# Patient Record
Sex: Female | Born: 1935 | Race: White | Hispanic: No | Marital: Married | Smoking: Never smoker
Health system: Southern US, Community
[De-identification: ages and names within clinical notes are randomized; demographics above are authoritative.]

## PROBLEM LIST (undated history)

## (undated) ENCOUNTER — Ambulatory Visit: Admission: EM

## (undated) DIAGNOSIS — I499 Cardiac arrhythmia, unspecified: Secondary | ICD-10-CM

## (undated) DIAGNOSIS — R51 Headache: Secondary | ICD-10-CM

## (undated) DIAGNOSIS — S27329A Contusion of lung, unspecified, initial encounter: Secondary | ICD-10-CM

## (undated) DIAGNOSIS — H8110 Benign paroxysmal vertigo, unspecified ear: Secondary | ICD-10-CM

## (undated) DIAGNOSIS — R001 Bradycardia, unspecified: Secondary | ICD-10-CM

## (undated) DIAGNOSIS — I10 Essential (primary) hypertension: Secondary | ICD-10-CM

## (undated) DIAGNOSIS — F419 Anxiety disorder, unspecified: Secondary | ICD-10-CM

## (undated) DIAGNOSIS — Z7901 Long term (current) use of anticoagulants: Secondary | ICD-10-CM

## (undated) DIAGNOSIS — E7800 Pure hypercholesterolemia, unspecified: Secondary | ICD-10-CM

## (undated) DIAGNOSIS — E079 Disorder of thyroid, unspecified: Secondary | ICD-10-CM

## (undated) DIAGNOSIS — Z170 Estrogen receptor positive status [ER+]: Principal | ICD-10-CM

## (undated) DIAGNOSIS — S2249XA Multiple fractures of ribs, unspecified side, initial encounter for closed fracture: Secondary | ICD-10-CM

## (undated) DIAGNOSIS — C50411 Malignant neoplasm of upper-outer quadrant of right female breast: Secondary | ICD-10-CM

## (undated) DIAGNOSIS — I639 Cerebral infarction, unspecified: Secondary | ICD-10-CM

## (undated) DIAGNOSIS — I4891 Unspecified atrial fibrillation: Secondary | ICD-10-CM

## (undated) HISTORY — DX: Estrogen receptor positive status (ER+): Z17.0

## (undated) HISTORY — DX: Bradycardia, unspecified: R00.1

## (undated) HISTORY — PX: PACEMAKER INSERTION: SHX728

## (undated) HISTORY — DX: Long term (current) use of anticoagulants: Z79.01

## (undated) HISTORY — DX: Malignant neoplasm of upper-outer quadrant of right female breast: C50.411

## (undated) HISTORY — PX: INSERT / REPLACE / REMOVE PACEMAKER: SUR710

## (undated) HISTORY — DX: Cerebral infarction, unspecified: I63.9

## (undated) HISTORY — PX: CHOLECYSTECTOMY: SHX55

---

## 2005-06-10 ENCOUNTER — Ambulatory Visit (HOSPITAL_COMMUNITY): Admission: RE | Admit: 2005-06-10 | Discharge: 2005-06-10 | Payer: Self-pay | Admitting: Internal Medicine

## 2005-06-10 ENCOUNTER — Ambulatory Visit: Payer: Self-pay | Admitting: Internal Medicine

## 2011-02-11 DIAGNOSIS — R011 Cardiac murmur, unspecified: Secondary | ICD-10-CM

## 2013-06-17 DIAGNOSIS — I639 Cerebral infarction, unspecified: Secondary | ICD-10-CM

## 2013-06-17 HISTORY — DX: Cerebral infarction, unspecified: I63.9

## 2013-07-11 ENCOUNTER — Inpatient Hospital Stay (HOSPITAL_COMMUNITY)
Admission: EM | Admit: 2013-07-11 | Discharge: 2013-07-16 | DRG: 041 | Disposition: A | Discharge status: Home / Self Care | Payer: Medicare Other | Attending: Neurology | Admitting: Neurology

## 2013-07-11 ENCOUNTER — Encounter (HOSPITAL_COMMUNITY): Payer: Self-pay | Admitting: Radiology

## 2013-07-11 ENCOUNTER — Emergency Department (HOSPITAL_COMMUNITY): Payer: Medicare Other

## 2013-07-11 DIAGNOSIS — I639 Cerebral infarction, unspecified: Secondary | ICD-10-CM | POA: Diagnosis present

## 2013-07-11 DIAGNOSIS — G8194 Hemiplegia, unspecified affecting left nondominant side: Secondary | ICD-10-CM | POA: Diagnosis present

## 2013-07-11 DIAGNOSIS — E039 Hypothyroidism, unspecified: Secondary | ICD-10-CM | POA: Diagnosis present

## 2013-07-11 DIAGNOSIS — E7800 Pure hypercholesterolemia, unspecified: Secondary | ICD-10-CM | POA: Diagnosis present

## 2013-07-11 DIAGNOSIS — K219 Gastro-esophageal reflux disease without esophagitis: Secondary | ICD-10-CM | POA: Diagnosis present

## 2013-07-11 DIAGNOSIS — R531 Weakness: Secondary | ICD-10-CM

## 2013-07-11 DIAGNOSIS — E782 Mixed hyperlipidemia: Secondary | ICD-10-CM | POA: Diagnosis present

## 2013-07-11 DIAGNOSIS — I6340 Cerebral infarction due to embolism of unspecified cerebral artery: Principal | ICD-10-CM | POA: Diagnosis present

## 2013-07-11 DIAGNOSIS — Z79899 Other long term (current) drug therapy: Secondary | ICD-10-CM

## 2013-07-11 DIAGNOSIS — R471 Dysarthria and anarthria: Secondary | ICD-10-CM | POA: Diagnosis present

## 2013-07-11 DIAGNOSIS — I69322 Dysarthria following cerebral infarction: Secondary | ICD-10-CM

## 2013-07-11 DIAGNOSIS — G8190 Hemiplegia, unspecified affecting unspecified side: Secondary | ICD-10-CM | POA: Diagnosis present

## 2013-07-11 DIAGNOSIS — I10 Essential (primary) hypertension: Secondary | ICD-10-CM | POA: Diagnosis present

## 2013-07-11 DIAGNOSIS — E785 Hyperlipidemia, unspecified: Secondary | ICD-10-CM | POA: Diagnosis present

## 2013-07-11 DIAGNOSIS — I63511 Cerebral infarction due to unspecified occlusion or stenosis of right middle cerebral artery: Secondary | ICD-10-CM | POA: Diagnosis present

## 2013-07-11 DIAGNOSIS — F411 Generalized anxiety disorder: Secondary | ICD-10-CM | POA: Diagnosis present

## 2013-07-11 HISTORY — DX: Anxiety disorder, unspecified: F41.9

## 2013-07-11 HISTORY — DX: Disorder of thyroid, unspecified: E07.9

## 2013-07-11 HISTORY — DX: Essential (primary) hypertension: I10

## 2013-07-11 HISTORY — DX: Headache: R51

## 2013-07-11 HISTORY — DX: Benign paroxysmal vertigo, unspecified ear: H81.10

## 2013-07-11 HISTORY — DX: Pure hypercholesterolemia, unspecified: E78.00

## 2013-07-11 LAB — POCT I-STAT TROPONIN I: Troponin i, poc: 0 ng/mL (ref 0.00–0.08)

## 2013-07-11 LAB — RAPID URINE DRUG SCREEN, HOSP PERFORMED
Amphetamines: NOT DETECTED
Benzodiazepines: NOT DETECTED
Cocaine: NOT DETECTED
Opiates: NOT DETECTED
Tetrahydrocannabinol: NOT DETECTED

## 2013-07-11 LAB — COMPREHENSIVE METABOLIC PANEL
ALT: 13 U/L (ref 0–35)
AST: 15 U/L (ref 0–37)
Albumin: 3.4 g/dL — ABNORMAL LOW (ref 3.5–5.2)
Alkaline Phosphatase: 67 U/L (ref 39–117)
Calcium: 8.4 mg/dL (ref 8.4–10.5)
Chloride: 107 mEq/L (ref 96–112)
Creatinine, Ser: 1.09 mg/dL (ref 0.50–1.10)
GFR calc non Af Amer: 46 mL/min — ABNORMAL LOW (ref >90)
Sodium: 141 mEq/L (ref 135–145)
Total Bilirubin: 0.7 mg/dL (ref 0.3–1.2)
Total Protein: 6.3 g/dL (ref 6.0–8.3)

## 2013-07-11 LAB — CBC
HCT: 32.8 % — ABNORMAL LOW (ref 36.0–46.0)
MCH: 29.8 pg (ref 26.0–34.0)
MCHC: 32.6 g/dL (ref 30.0–36.0)
MCV: 91.4 fL (ref 78.0–100.0)
Platelets: 142 10*3/uL — ABNORMAL LOW (ref 150–400)
RBC: 3.59 MIL/uL — ABNORMAL LOW (ref 3.87–5.11)
RDW: 12.7 % (ref 11.5–15.5)
WBC: 5.1 10*3/uL (ref 4.0–10.5)

## 2013-07-11 LAB — DIFFERENTIAL
Basophils Absolute: 0 10*3/uL (ref 0.0–0.1)
Basophils Relative: 0 % (ref 0–1)
Eosinophils Absolute: 0.1 10*3/uL (ref 0.0–0.7)
Eosinophils Relative: 3 % (ref 0–5)
Monocytes Absolute: 0.7 10*3/uL (ref 0.1–1.0)

## 2013-07-11 LAB — PROTIME-INR: Prothrombin Time: 12.7 seconds (ref 11.6–15.2)

## 2013-07-11 LAB — GLUCOSE, CAPILLARY: Glucose-Capillary: 110 mg/dL — ABNORMAL HIGH (ref 70–99)

## 2013-07-11 LAB — URINALYSIS, ROUTINE W REFLEX MICROSCOPIC
Bilirubin Urine: NEGATIVE
Hgb urine dipstick: NEGATIVE
Ketones, ur: NEGATIVE mg/dL
Specific Gravity, Urine: 1.014 (ref 1.005–1.030)
Urobilinogen, UA: 1 mg/dL (ref 0.0–1.0)
pH: 5.5 (ref 5.0–8.0)

## 2013-07-11 LAB — POCT I-STAT, CHEM 8
Calcium, Ion: 1.15 mmol/L (ref 1.13–1.30)
HCT: 32 % — ABNORMAL LOW (ref 36.0–46.0)
Hemoglobin: 10.9 g/dL — ABNORMAL LOW (ref 12.0–15.0)
TCO2: 25 mmol/L (ref 0–100)

## 2013-07-11 LAB — ETHANOL: Alcohol, Ethyl (B): <11 mg/dL (ref 0–11)

## 2013-07-11 MED ORDER — INFLUENZA VAC SPLIT QUAD 0.5 ML IM SUSP
0.5000 mL | INTRAMUSCULAR | Status: AC
  Administered 2013-07-12: 0.5 mL via INTRAMUSCULAR
  Filled 2013-07-11: qty 0.5

## 2013-07-11 MED ORDER — STROKE: EARLY STAGES OF RECOVERY BOOK
Freq: Once | Status: AC
  Administered 2013-07-12
  Filled 2013-07-11 (×2): qty 1

## 2013-07-11 MED ORDER — BIOTENE DRY MOUTH MT LIQD: 15.0000 mL | Freq: Two times a day (BID) | OROMUCOSAL | Status: DC

## 2013-07-11 MED ORDER — LABETALOL HCL 5 MG/ML IV SOLN: 10.0000 mg | INTRAVENOUS | Status: DC | PRN

## 2013-07-11 MED ORDER — ACETAMINOPHEN 650 MG RE SUPP: 650.0000 mg | RECTAL | Status: DC | PRN

## 2013-07-11 MED ORDER — ACETAMINOPHEN 325 MG PO TABS
650.0000 mg | ORAL_TABLET | ORAL | Status: DC | PRN
  Administered 2013-07-12 – 2013-07-16 (×2): 650 mg via ORAL
  Filled 2013-07-11 (×2): qty 2

## 2013-07-11 MED ORDER — ALTEPLASE (STROKE) FULL DOSE INFUSION
0.9000 mg/kg | Freq: Once | INTRAVENOUS | Status: AC
  Administered 2013-07-11: 56 mg via INTRAVENOUS
  Filled 2013-07-11: qty 56

## 2013-07-11 MED ORDER — SODIUM CHLORIDE 0.9 % IV SOLN
INTRAVENOUS | Status: DC
  Administered 2013-07-11 – 2013-07-12 (×2): via INTRAVENOUS

## 2013-07-11 MED ORDER — SENNOSIDES-DOCUSATE SODIUM 8.6-50 MG PO TABS
1.0000 | ORAL_TABLET | Freq: Every evening | ORAL | Status: DC | PRN
  Filled 2013-07-11: qty 1

## 2013-07-11 MED ORDER — PANTOPRAZOLE SODIUM 40 MG IV SOLR
40.0000 mg | Freq: Every day | INTRAVENOUS | Status: DC
  Administered 2013-07-12 (×2): 40 mg via INTRAVENOUS
  Filled 2013-07-11 (×4): qty 40

## 2013-07-11 NOTE — ED Notes (Signed)
Pt remains alert and oriented x's 3, family at bedside.   Report given and pt transported to 3M01

## 2013-07-11 NOTE — Progress Notes (Signed)
Patient kept npo including medications.

## 2013-07-11 NOTE — ED Provider Notes (Signed)
CSN: [id number]     Arrival date & time 07/11/13  1946 History   First MD Initiated Contact with Patient 07/11/13 1950     Chief Complaint  Patient presents with  . Code Stroke   (Consider location/radiation/quality/duration/timing/severity/associated sxs/prior Treatment) Patient is a 79 y.o. female presenting with weakness. The history is provided by the EMS personnel and the patient. The history is limited by the condition of the patient (code stroke, difficulty speaking).  Weakness This is a new problem. The current episode started today (1700). The problem occurs constantly. The problem has been unchanged. Associated symptoms include weakness (left arm, leg, and face). Pertinent negatives include no arthralgias, chest pain, congestion, fever, headaches, myalgias, nausea, numbness, rash or vomiting. Nothing aggravates the symptoms. She has tried nothing for the symptoms. The treatment provided no relief.    Past Medical History  Diagnosis Date  . Hypertension   . Vertical vertigo   . Thyroid disease   . High cholesterol    Past Surgical History  Procedure Laterality Date  . Cholecystectomy     No family history on file. History  Substance Use Topics  . Smoking status: Never Smoker   . Smokeless tobacco: Not on file  . Alcohol Use: No   OB History   Grav Para Term Preterm Abortions TAB SAB Ect Mult Living                 Review of Systems  Constitutional: Negative for fever, activity change and appetite change.  HENT: Positive for trouble swallowing. Negative for congestion.   Cardiovascular: Negative for chest pain.  Gastrointestinal: Negative for nausea, vomiting, diarrhea and constipation.  Genitourinary: Negative for difficulty urinating.  Musculoskeletal: Negative for myalgias and arthralgias.  Skin: Negative for color change, pallor, rash and wound.  Neurological: Positive for facial asymmetry, speech difficulty and weakness (left arm, leg, and face). Negative for  light-headedness, numbness and headaches.  All other systems reviewed and are negative.    Allergies  Penicillins and Shellfish allergy  Home Medications   No current outpatient prescriptions on file. BP 127/46  Pulse 78  Temp(Src) 98.5 F (36.9 C) (Oral)  Resp 14  Wt 143 lb 4.8 oz (65 kg)  SpO2 95% Physical Exam  Constitutional: She is oriented to person, place, and time. She appears well-developed and well-nourished.  HENT:  Head: Normocephalic and atraumatic.  Nose: Nose normal.  Mouth/Throat: Uvula is midline, oropharynx is clear and moist and mucous membranes are normal.  Left-sided facial droop with slurred speech  Eyes: Conjunctivae and EOM are normal. Pupils are equal, round, and reactive to light.  Neck: Normal range of motion. Neck supple.  Cardiovascular: Normal rate, regular rhythm and normal heart sounds.  Exam reveals no gallop and no friction rub.   No murmur heard. Pulmonary/Chest: Effort normal and breath sounds normal. No respiratory distress. She has no wheezes. She has no rales.  Abdominal: Soft. She exhibits no distension. There is no tenderness.  Musculoskeletal: Normal range of motion. She exhibits no edema and no tenderness.  Lymphadenopathy:    She has no cervical adenopathy.  Neurological: She is alert and oriented to person, place, and time. She displays no tremor. A cranial nerve deficit (left-sided facial droop) is present. No sensory deficit. She exhibits normal muscle tone. Coordination (due to weakness on left side) abnormal. GCS eye subscore is 4. GCS verbal subscore is 5. GCS motor subscore is 6.  0/5 strength in LUE and LLE  Skin: Skin is warm  and dry. No rash noted. She is not diaphoretic.    ED Course  Procedures (including critical care time) Labs Review Labs Reviewed  APTT - Abnormal; Notable for the following:    aPTT 42 (*)    All other components within normal limits  CBC - Abnormal; Notable for the following:    RBC 3.59 (*)     Hemoglobin 10.7 (*)    HCT 32.8 (*)    Platelets 142 (*)    All other components within normal limits  DIFFERENTIAL - Abnormal; Notable for the following:    Monocytes Relative 14 (*)    All other components within normal limits  COMPREHENSIVE METABOLIC PANEL - Abnormal; Notable for the following:    Glucose, Bld 102 (*)    Albumin 3.4 (*)    GFR calc non Af Amer 46 (*)    GFR calc Af Amer 53 (*)    All other components within normal limits  GLUCOSE, CAPILLARY - Abnormal; Notable for the following:    Glucose-Capillary 110 (*)    All other components within normal limits  POCT I-STAT, CHEM 8 - Abnormal; Notable for the following:    Glucose, Bld 100 (*)    Hemoglobin 10.9 (*)    HCT 32.0 (*)    All other components within normal limits  MRSA PCR SCREENING  ETHANOL  PROTIME-INR  TROPONIN I  URINE RAPID DRUG SCREEN (HOSP PERFORMED)  URINALYSIS, ROUTINE W REFLEX MICROSCOPIC  HEMOGLOBIN A1C  LIPID PANEL  POCT I-STAT TROPONIN I   Imaging Review Ct Head Wo Contrast  07/11/2013   CLINICAL DATA:  Slurred speech.  EXAM: CT HEAD WITHOUT CONTRAST  TECHNIQUE: Contiguous axial images were obtained from the base of the skull through the vertex without intravenous contrast.  COMPARISON:  MR dated 01/28/2013.  FINDINGS: Minimally enlarged ventricles and subarachnoid spaces. Mild patchy white matter low density in both cerebral hemispheres. This remains more focal in the left frontal lobe. No intracranial hemorrhage, mass lesion or CT evidence of acute infarction. Unremarkable bones and included paranasal sinuses.  IMPRESSION: No acute abnormality. Minimal atrophy and mild chronic small vessel white matter ischemic changes.  These results were called by telephone at the time of interpretation on 07/11/2013 at 8:10 PM to Dr. Dr. Vanetta Mulders , who verbally acknowledged these results.   Electronically Signed   By: Gordan Payment   On: 07/11/2013 20:11    MDM   1. Stroke   2. Left-sided weakness      The patient is an 79 year old female with a past medical history of hypertension he presents as acute stroke. The patient was met at the bridge and her airway was deemed to be intact. She was found to have profound left-sided weakness with 0/5 strength in her left upper and left lower extremities with left facial droop and trouble with speaking. The onset of these symptoms was approximately 5 minutes til 7 when she was sitting at a computer. She was taken straight to the scanner where CT of her head was done showing no acute intracranial abnormality. Based on symptoms as well as lack of bleeding in the brain, TPA was given based on neurology recommendations. Code stroke labs were sent and anticipate admission to the neuro ICU for further monitoring and treatment.  Labs show no UTI, mild hyperglycemia, mild anemia, normal lytes, and negative troponin. Admitted to Neuro ICU in stable condition.  Patient was discussed with my attending, Dr. Deretha Emory.   Dorna Leitz, MD 07/11/13  2152 

## 2013-07-11 NOTE — Code Documentation (Signed)
79 yo wf brought in via [facility] with sudden onset Lt side weakness, slurred speech, & facial droop.  Per report pt was working at the computer & reached over to fix the printer when she developed Lt side weakness & her husband assisted her to the floor.  Code stroke called 1928, pt arrival 1943, LKW 1850, EDP exam 1946, stroke team arrival 1935, neurologist arrival 1945, pt arrival in CT, phlebotomist arrival 1945, pharmacy notified to mix tPA 2007, ICU bed requested 2020, tPA delivered 2020, tPA admistered 2022, door to needle 0039.  NIH 16 Lt side weakness, slurred speech, facial droop, Lt neglect, sensory deficit.

## 2013-07-11 NOTE — ED Notes (Addendum)
Family st's pt was working on computer when she started having slurred and problems walking.  On arrival to ED pt alert and oriented x's 3.

## 2013-07-11 NOTE — Consult Note (Addendum)
Referring Physician: ED    Chief Complaint: CODE STROKE: LEFT HEMIPARESIS, LEFT FACE WEAKNESS, DYSARTHRIA.  HPI:                                                                                                                                         Jessica Myers is an 79 y.o. female, right handed, with a past medical history significant for HTN, hyperlipidemia, GERD, brought to [facility] ED by medics as a code stroke due to acute onset left hemiparesis, left face weakness, and dysarthria. She was last known well at 1850 pm today, doing some work at her computer, when her husband noticed that she was having slurred speech, weakness of the left face, and inability to use the left side of her body. Ambulance called and upon initial evaluation in the ED she had NIHSS 16. CT brain revealed no acute intracranial abnormality. Of importance, family report that couple of weeks ago she had transient slurred speech and had a brain MRI that was normal. At this moment, she is alert and awake, able to follow commands properly.  Date last known well: 07/11/13  Time last known well: 1850 pm tPA Given: yes NIHSS: 16 MRS: 3   History reviewed. No pertinent past medical history.  No past surgical history on file.  No family history on file. Social History:  has no tobacco, alcohol, and drug history on file.  Allergies: Allergies not on file  Medications:                                                                                                                           I have reviewed the patient's current medications.  ROS:  History obtained from chart review, family, EMS  General ROS: negative for - chills, fatigue, fever, night sweats, weight gain or weight loss Psychological ROS: negative for - behavioral disorder, hallucinations, memory difficulties, or suicidal  ideation Ophthalmic ROS: negative for - blurry vision, double vision, eye pain or loss of vision ENT ROS: negative for - epistaxis, nasal discharge, oral lesions, sore throat, tinnitus or vertigo Allergy and Immunology ROS: negative for - hives or itchy/watery eyes Hematological and Lymphatic ROS: negative for - bleeding problems, bruising or swollen lymph nodes Endocrine ROS: negative for - galactorrhea, hair pattern changes, polydipsia/polyuria or temperature intolerance Respiratory ROS: negative for - cough, hemoptysis, shortness of breath or wheezing Cardiovascular ROS: negative for - chest pain, dyspnea on exertion, edema or irregular heartbeat Gastrointestinal ROS: negative for - abdominal pain, diarrhea, hematemesis, nausea/vomiting or stool incontinence Genito-Urinary ROS: negative for - dysuria, hematuria, incontinence or urinary frequency/urgency Musculoskeletal ROS: negative for - joint swelling Neurological ROS: as noted in HPI Dermatological ROS: negative for rash and skin lesion changes  Physical exam: pleasant female in no apparent distress.  BP 150/70 Weight 62.596 kg (138 lb). Pulse 72, afebrile Head: normocephalic. Neck: supple, no bruits, no JVD. Cardiac: no murmurs. Lungs: clear. Abdomen: soft, no tender, no mass. Extremities: no edema.  Neurologic Examination:                                                                                                      Mental Status: Alert, awake, oriented, thought content appropriate.  Comprehension, naming, and repetition intact. Dysarthric without evidence of aphasia.  Able to follow 3 step commands without difficulty. Cranial Nerves: II: Discs flat bilaterally; Visual fields grossly normal, pupils equal, round, reactive to light and accommodation III,IV, VI: ptosis not present, extra-ocular motions intact bilaterally V: facial light touch sensation normal bilaterally VII: mild left lower face weakness. VIII: hearing  normal bilaterally IX,X: gag reflex present XI: bilateral shoulder shrug XII: midline tongue extension Motor: Significant for left HP, arm greater than leg Tone and bulk: seems to have increase tone in the left UE tone throughout; no atrophy noted Sensory: Pinprick and light touch slightly impaired in the left. Neglecting the left side. Deep Tendon Reflexes:  Right: Upper Extremity   Left: Upper extremity   biceps (C-5 to C-6) 2/4   biceps (C-5 to C-6) 2/4 tricep (C7) 2/4    triceps (C7) 2/4 Brachioradialis (C6) 2/4  Brachioradialis (C6) 2/4  Lower Extremity Lower Extremity  quadriceps (L-2 to L-4) 2/4   quadriceps (L-2 to L-4) 2/4 Achilles (S1) 2/4   Achilles (S1) 2/4  Plantars: Right: downgoing   Left: upgoing Cerebellar: normal finger-to-nose right,  Can nor perform in the left. Gait:  No tested. CV: pulses palpable throughout    Results for orders placed during the hospital encounter of 07/11/13 (from the past 48 hour(s))  PROTIME-INR     Status: None   Collection Time    07/11/13  7:48 PM      Result Value Range   Prothrombin Time 12.7  11.6 - 15.2 seconds  INR 0.97  0.00 - 1.49  APTT     Status: Abnormal   Collection Time    07/11/13  7:48 PM      Result Value Range   aPTT 42 (*) 24 - 37 seconds   Comment:            IF BASELINE aPTT IS ELEVATED,     SUGGEST PATIENT RISK ASSESSMENT     BE USED TO DETERMINE APPROPRIATE     ANTICOAGULANT THERAPY.  CBC     Status: Abnormal   Collection Time    07/11/13  7:48 PM      Result Value Range   WBC 5.1  4.0 - 10.5 K/uL   RBC 3.59 (*) 3.87 - 5.11 MIL/uL   Hemoglobin 10.7 (*) 12.0 - 15.0 g/dL   HCT 81.1 (*) 91.4 - 78.2 %   MCV 91.4  78.0 - 100.0 fL   MCH 29.8  26.0 - 34.0 pg   MCHC 32.6  30.0 - 36.0 g/dL   RDW 95.6  21.3 - 08.6 %   Platelets 142 (*) 150 - 400 K/uL  DIFFERENTIAL     Status: Abnormal   Collection Time    07/11/13  7:48 PM      Result Value Range   Neutrophils Relative % 45  43 - 77 %   Neutro  Abs 2.3  1.7 - 7.7 K/uL   Lymphocytes Relative 38  12 - 46 %   Lymphs Abs 1.9  0.7 - 4.0 K/uL   Monocytes Relative 14 (*) 3 - 12 %   Monocytes Absolute 0.7  0.1 - 1.0 K/uL   Eosinophils Relative 3  0 - 5 %   Eosinophils Absolute 0.1  0.0 - 0.7 K/uL   Basophils Relative 0  0 - 1 %   Basophils Absolute 0.0  0.0 - 0.1 K/uL  POCT I-STAT TROPONIN I     Status: None   Collection Time    07/11/13  7:56 PM      Result Value Range   Troponin i, poc 0.00  0.00 - 0.08 ng/mL   Comment 3            Comment: Due to the release kinetics of cTnI,     a negative result within the first hours     of the onset of symptoms does not rule out     myocardial infarction with certainty.     If myocardial infarction is still suspected,     repeat the test at appropriate intervals.  POCT I-STAT, CHEM 8     Status: Abnormal   Collection Time    07/11/13  7:58 PM      Result Value Range   Sodium 143  135 - 145 mEq/L   Potassium 4.0  3.5 - 5.1 mEq/L   Chloride 108  96 - 112 mEq/L   BUN 14  6 - 23 mg/dL   Creatinine, Ser 5.78  0.50 - 1.10 mg/dL   Glucose, Bld 469 (*) 70 - 99 mg/dL   Calcium, Ion 6.29  5.28 - 1.30 mmol/L   TCO2 25  0 - 100 mmol/L   Hemoglobin 10.9 (*) 12.0 - 15.0 g/dL   HCT 41.3 (*) 24.4 - 01.0 %   Ct Head Wo Contrast  07/11/2013   CLINICAL DATA:  Slurred speech.  EXAM: CT HEAD WITHOUT CONTRAST  TECHNIQUE: Contiguous axial images were obtained from the base of the skull through the vertex without intravenous contrast.  COMPARISON:  MR dated 01/28/2013.  FINDINGS: Minimally enlarged ventricles and subarachnoid spaces. Mild patchy white matter low density in both cerebral hemispheres. This remains more focal in the left frontal lobe. No intracranial hemorrhage, mass lesion or CT evidence of acute infarction. Unremarkable bones and included paranasal sinuses.  IMPRESSION: No acute abnormality. Minimal atrophy and mild chronic small vessel white matter ischemic changes.  These results were called  by telephone at the time of interpretation on 07/11/2013 at 8:10 PM to Dr. Dr. Vanetta Mulders , who verbally acknowledged these results.   Electronically Signed   By: Gordan Payment   On: 07/11/2013 20:11     Assessment: 79 y.o. female with right brain stroke. NIHSS 16. CT brain unremarkable for acute abnormality. All inclusion and exclusion criteria for iv tpa were reviewed and patient is deemed an appropriate candidate for IV thrombolysis and thus we pursued that pathway. Family at the bedside and I explained in details the risk and benefits of tpa and they agreed with that therapy. Admit to NICU and follow post IV tpa protocol. Will follow up closely tonight and the stroke team will resume care in the morning.  Stroke Risk Factors - age, HTN, hyperlipidemia.  Plan: 1. HgbA1c, fasting lipid panel 2. MRI, MRA  of the brain without contrast 3. Echocardiogram 4. Carotid dopplers 5. Prophylactic therapy-None for next 24 hours after tpa. 6. Risk factor modification 7. Telemetry monitoring 8. Frequent neuro checks 9. PT/OT SLP

## 2013-07-12 ENCOUNTER — Inpatient Hospital Stay (HOSPITAL_COMMUNITY): Payer: Medicare Other

## 2013-07-12 ENCOUNTER — Encounter (HOSPITAL_COMMUNITY): Payer: Self-pay | Admitting: Neurology

## 2013-07-12 DIAGNOSIS — I6350 Cerebral infarction due to unspecified occlusion or stenosis of unspecified cerebral artery: Secondary | ICD-10-CM

## 2013-07-12 DIAGNOSIS — I6330 Cerebral infarction due to thrombosis of unspecified cerebral artery: Secondary | ICD-10-CM

## 2013-07-12 LAB — LIPID PANEL
Cholesterol: 136 mg/dL (ref 0–200)
LDL Cholesterol: 67 mg/dL (ref 0–99)
Total CHOL/HDL Ratio: 2.5 RATIO
Triglycerides: 71 mg/dL (ref <150)
VLDL: 14 mg/dL (ref 0–40)

## 2013-07-12 MED ORDER — ATORVASTATIN CALCIUM 10 MG PO TABS
10.0000 mg | ORAL_TABLET | Freq: Every day | ORAL | Status: DC
  Administered 2013-07-12 – 2013-07-16 (×5): 10 mg via ORAL
  Filled 2013-07-12 (×5): qty 1

## 2013-07-12 MED ORDER — ALPRAZOLAM 0.5 MG PO TABS
0.5000 mg | ORAL_TABLET | ORAL | Status: DC
  Filled 2013-07-12: qty 1

## 2013-07-12 MED ORDER — ASPIRIN EC 325 MG PO TBEC
325.0000 mg | DELAYED_RELEASE_TABLET | Freq: Once | ORAL | Status: AC
  Administered 2013-07-12: 325 mg via ORAL
  Filled 2013-07-12: qty 1

## 2013-07-12 MED ORDER — LEVOTHYROXINE SODIUM 100 MCG PO TABS
100.0000 ug | ORAL_TABLET | Freq: Every day | ORAL | Status: DC
  Administered 2013-07-12 – 2013-07-16 (×5): 100 ug via ORAL
  Filled 2013-07-12 (×7): qty 1

## 2013-07-12 MED ORDER — ALPRAZOLAM 0.25 MG PO TABS
0.2500 mg | ORAL_TABLET | Freq: Four times a day (QID) | ORAL | Status: DC | PRN
  Administered 2013-07-12 – 2013-07-15 (×6): 0.25 mg via ORAL
  Filled 2013-07-12 (×5): qty 1

## 2013-07-12 NOTE — Progress Notes (Signed)
Stroke Team Progress Note  HISTORY Jessica Myers is an 79 y.o. female, right handed, with a past medical history significant for HTN, hyperlipidemia, GERD, brought to [facility] ED by medics as a code stroke due to acute onset left hemiparesis, left face weakness, and dysarthria.  She was last known well at 1850 pm today, doing some work at her computer, when her husband noticed that she was having slurred speech, weakness of the left face, and inability to use the left side of her body. Ambulance called and upon initial evaluation in the ED she had NIHSS 16.   CT brain revealed no acute intracranial abnormality.   Of importance, family report that couple of weeks ago she had transient slurred speech and had a brain MRI that was normal  Date last known well: 07/11/13  Time last known well: 1850 pm  tPA Given: yes  NIHSS: 16  MRS: 3   SUBJECTIVE Her family is at the bedside.  Overall she feels her condition is rapidly improving.   OBJECTIVE Most recent Vital Signs: Filed Vitals:   07/12/13 0500 07/12/13 0515 07/12/13 0600 07/12/13 0700  BP: 134/105  133/70 127/50  Pulse: 67 62 66 63  Temp:    98.2 F (36.8 C)  TempSrc:    Oral  Resp: 20 18 17 15   Height:      Weight:      SpO2: 100% 97% 100% 98%   CBG (last 3)   Recent Labs  07/11/13 2010  GLUCAP 110*    IV Fluid Intake:   . sodium chloride 75 mL/hr at 07/12/13 0600    MEDICATIONS  . antiseptic oral rinse  15 mL Mouth Rinse BID  . influenza vac split quadrivalent PF  0.5 mL Intramuscular Tomorrow-1000  . pantoprazole (PROTONIX) IV  40 mg Intravenous QHS   PRN:  acetaminophen, acetaminophen, labetalol, senna-docusate  Diet:  NPO  Activity:  Bedrest  DVT Prophylaxis:  SCD  CLINICALLY SIGNIFICANT STUDIES Basic Metabolic Panel:  Recent Labs Lab 07/11/13 1948 07/11/13 1958  NA 141 143  K 4.0 4.0  CL 107 108  CO2 23  --   GLUCOSE 102* 100*  BUN 14 14  CREATININE 1.09 1.10  CALCIUM 8.4  --    Liver Function  Tests:  Recent Labs Lab 07/11/13 1948  AST 15  ALT 13  ALKPHOS 67  BILITOT 0.7  PROT 6.3  ALBUMIN 3.4*   CBC:  Recent Labs Lab 07/11/13 1948 07/11/13 1958  WBC 5.1  --   NEUTROABS 2.3  --   HGB 10.7* 10.9*  HCT 32.8* 32.0*  MCV 91.4  --   PLT 142*  --    Coagulation:  Recent Labs Lab 07/11/13 1948  LABPROT 12.7  INR 0.97   Cardiac Enzymes:  Recent Labs Lab 07/11/13 1948  TROPONINI <0.30   Urinalysis:  Recent Labs Lab 07/11/13 2031  COLORURINE YELLOW  LABSPEC 1.014  PHURINE 5.5  GLUCOSEU NEGATIVE  HGBUR NEGATIVE  BILIRUBINUR NEGATIVE  KETONESUR NEGATIVE  PROTEINUR NEGATIVE  UROBILINOGEN 1.0  NITRITE NEGATIVE  LEUKOCYTESUR NEGATIVE   Lipid Panel    Component Value Date/Time   CHOL 136 07/12/2013 0640   TRIG 71 07/12/2013 0640   HDL 55 07/12/2013 0640   CHOLHDL 2.5 07/12/2013 0640   VLDL 14 07/12/2013 0640   LDLCALC 67 07/12/2013 0640   HgbA1C  No results found for this basename: HGBA1C    Urine Drug Screen:     Component Value Date/Time  LABOPIA NONE DETECTED 07/11/2013 2031   COCAINSCRNUR NONE DETECTED 07/11/2013 2031   LABBENZ NONE DETECTED 07/11/2013 2031   AMPHETMU NONE DETECTED 07/11/2013 2031   THCU NONE DETECTED 07/11/2013 2031   LABBARB NONE DETECTED 07/11/2013 2031    Alcohol Level:  Recent Labs Lab 07/11/13 1948  ETH <11    Ct Head Wo Contrast 07/11/2013   : No acute abnormality. Minimal atrophy and mild chronic small vessel white matter ischemic changes.   MRI of the brain    MRA of the brain    2D Echocardiogram    Carotid Doppler  Bilateral: 1-39% ICA stenosis. Vertebral artery flow is antegrade  CXR    EKG  normal sinus rhythm.   Therapy Recommendations   Physical Exam   GENERAL EXAM: Patient is in no distress  CARDIOVASCULAR: Regular rate and rhythm, no murmurs, no carotid bruits  NEUROLOGIC: MENTAL STATUS: awake, alert, language fluent, comprehension intact, naming intact CRANIAL NERVE: MILD DYSARTHRIA.  pupils equal and reactive to light, visual fields full to confrontation, extraocular muscles intact, no nystagmus, facial sensation and strength symmetric, uvula midline, shoulder shrug symmetric, tongue midline. MOTOR: normal bulk and tone, full strength in the RUE, RLE. LUE DRIFT. LUE 3-4. LLE DRIFT (FALLS TO GROUND), 3/5 DISTAL. SENSORY: DECR IN LEFT SIDE. COORDINATION: SLOW ON LEFT SIDE    ASSESSMENT Jessica Myers is a 79 y.o. female presenting with left hemiparesis, dysarthria. Status post IV t-PA full dose on 07/11/2013 at 2022. Imaging does not show an acute infarct. Infarct felt to be embolic secondary to unknown source.  On no antithrombotics prior to admission. Now on no antithrombotics as imaging pending for 24 hour MRI for secondary stroke prevention. Patient with resultant left hemiparesis. Work up underway.   Hypertension  Hyperlipidemia, LDL 71 on lipitor, continue statin.  Left hemiparesis  Dysarthria  Hypothyroidism  Long term medication use  Hospital day # 1  TREATMENT/PLAN  If follow up imaging stable, start aspirin 325 mg orally every day for secondary stroke prevention.  Risk factor management  Therapy evaluations  F/U MRI, echo, carotids, hgb a1c.  If stable transfer out of unit in am.  Larita Fife D. Manson Passey, [facility], MBA, MHA [facility] Pager: [phone] 07/12/2013 2:36 PM  I have personally obtained a history, examined the patient, evaluated imaging results, and formulated the assessment and plan of care. I agree with the above.  Suanne Marker, MD 07/12/2013, 9:26 AM Certified in Neurology, Neurophysiology and Neuroimaging [facility] - Stroke Team  Please refer to [email or web address] for on-call Stroke MD

## 2013-07-12 NOTE — Evaluation (Signed)
Speech Language Pathology Evaluation Patient Details Name: Jessica Myers MRN: [id number] DOB: [birth year] Today's Date: 07/12/2013 Time: 4098-1191 SLP Time Calculation (min): 25 min  Problem List: There are no active problems to display for this patient.  Past Medical History:  Past Medical History  Diagnosis Date  . Hypertension   . Vertical vertigo   . Thyroid disease   . High cholesterol    Past Surgical History:  Past Surgical History  Procedure Laterality Date  . Cholecystectomy     HPI:  79 y.o. female with right brain stroke. NIHSS 16. CT brain unremarkable for acute abnormality. Pt given tpa.    Assessment / Plan / Recommendation Clinical Impression  Pt demonstrates a mild dysarthria associated with left lingual, labial weakness. Pt is 100% intelligible at conversation level, though extra effort is required for strong articulatory contacts. Pt also requires extra time and min question cues to correct errors with higher level verbal and functional problem solving tasks. She is able to complete visual and functional tasks in left visual field but requires min verbal or visual cues to bring her gaze fully to the left. Pt will benefit from ongoing actue SLP thearpy for compensatory strategies with higher level functional tasks. Pt also would benefit from CIR consult.     SLP Assessment  Patient needs continued Speech Lanaguage Pathology Services    Follow Up Recommendations  Inpatient Rehab    Frequency and Duration min 2x/week  2 weeks   Pertinent Vitals/Pain NA   SLP Goals  SLP Goals Potential to Achieve Goals: Good SLP Goal #1: Pt will complete functional tasks with min contextual cues to scan from left to right as needed.  SLP Goal #1 - Progress: Progressing toward goal SLP Goal #2: Pt will correctly complete complex functional problem solving task with min verbal cues.  SLP Goal #2 - Progress: Progressing toward goal SLP Goal #3: Pt will verbalize at  conversation level with supervision cues for overarticualtion for 5 minutes.  SLP Goal #3 - Progress: Progressing toward goal  SLP Evaluation Prior Functioning  Cognitive/Linguistic Baseline: Within functional limits Type of Home: House  Lives With: Spouse Available Help at Discharge: Family Vocation: Retired   IT consultant  Overall Cognitive Status: Impaired/Different from baseline Arousal/Alertness: Awake/alert Orientation Level: Oriented to person;Oriented to place;Oriented to situation;Disoriented to time Attention: Focused;Sustained;Selective;Alternating Focused Attention: Appears intact Sustained Attention: Appears intact Selective Attention: Appears intact Alternating Attention: Appears intact Memory: Appears intact Awareness: Impaired Awareness Impairment: Emergent impairment Problem Solving: Impaired Problem Solving Impairment: Verbal complex;Functional complex Executive Function: Reasoning Reasoning: Impaired Reasoning Impairment: Verbal complex Safety/Judgment: Appears intact    Comprehension  Auditory Comprehension Overall Auditory Comprehension: Appears within functional limits for tasks assessed    Expression Verbal Expression Overall Verbal Expression: Appears within functional limits for tasks assessed Written Expression Dominant Hand: Right   Oral / Motor Oral Motor/Sensory Function Overall Oral Motor/Sensory Function: Impaired Labial ROM: Reduced left Labial Symmetry: Abnormal symmetry left Labial Strength: Reduced Labial Sensation: Within Functional Limits Lingual ROM: Reduced left Lingual Symmetry: Abnormal symmetry left Lingual Strength: Reduced Lingual Sensation: Reduced Facial ROM: Reduced left Facial Symmetry: Left droop Facial Strength: Reduced Facial Sensation: Reduced Velum: Within Functional Limits Mandible: Within Functional Limits Motor Speech Overall Motor Speech: Impaired Respiration: Within functional limits Phonation:  Hoarse Resonance: Within functional limits Articulation: Impaired Level of Impairment: Conversation Intelligibility: Intelligible Motor Planning: Witnin functional limits Motor Speech Errors: Aware   GO     Lakeitha Basques, Riley Nearing 07/12/2013, 10:30 AM

## 2013-07-12 NOTE — Progress Notes (Addendum)
PT Cancellation Note  Patient Details Name: TERRE HANNEMAN MRN: [id number] DOB: 06/08/1929   Cancelled Treatment:    Reason Eval/Treat Not Completed: Medical issues which prohibited therapy.  Pt on bedrest until after 2022 tonight.  Will place on list for the am and evaluate in the am.     INGOLD,Janea Schwenn 07/12/2013, 10:01 AM  [facility] [phone] (pager)

## 2013-07-12 NOTE — Evaluation (Signed)
Clinical/Bedside Swallow Evaluation Patient Details  Name: Jessica Myers MRN: [id number] Date of Birth: Oct 08, 1929  Today's Date: 07/12/2013 Time: 2130-8657 SLP Time Calculation (min): 25 min  Past Medical History:  Past Medical History  Diagnosis Date  . Hypertension   . Vertical vertigo   . Thyroid disease   . High cholesterol    Past Surgical History:  Past Surgical History  Procedure Laterality Date  . Cholecystectomy     HPI:  79 y.o. female with right brain stroke. NIHSS 16. CT brain unremarkable for acute abnormality. Pt given tpa.    Assessment / Plan / Recommendation Clinical Impression  Pt demonstrates appearance of a mild oral dysphagia due to left labial and lingual weakness with mild oral residual on left and mild anterior spillage. Pt is able to clear with min verbal cues. With large straw sips with is intermittent, immediate hard cough, likely due to delayed swallow initiation. When cues for small sips, pt appears safe and functional. WIll follow for tolerance of diet and use of compensatory strategies.     Aspiration Risk  Mild    Diet Recommendation Dysphagia 3 (Mechanical Soft);Thin liquid   Liquid Administration via: Cup;No straw Medication Administration: Whole meds with puree Supervision: Patient able to self feed;Intermittent supervision to cue for compensatory strategies Compensations: Slow rate;Small sips/bites;Check for pocketing Postural Changes and/or Swallow Maneuvers: Seated upright 90 degrees    Other  Recommendations Oral Care Recommendations: Oral care before and after PO   Follow Up Recommendations  Inpatient Rehab    Frequency and Duration min 2x/week  2 weeks   Pertinent Vitals/Pain NA    SLP Swallow Goals Patient will utilize recommended strategies during swallow to increase swallowing safety with: Supervision/safety Swallow Study Goal #2 - Progress: Progressing toward goal   Swallow Study Prior Functional Status        General HPI: 79 y.o. female with right brain stroke. NIHSS 16. CT brain unremarkable for acute abnormality. Pt given tpa.  Type of Study: Bedside swallow evaluation Diet Prior to this Study: NPO Temperature Spikes Noted: No Respiratory Status: Room air History of Recent Intubation: No Behavior/Cognition: Alert;Cooperative;Pleasant mood;Requires cueing Oral Cavity - Dentition: Adequate natural dentition Self-Feeding Abilities: Able to feed self Patient Positioning: Upright in bed Baseline Vocal Quality: Clear Volitional Cough: Strong Volitional Swallow: Able to elicit    Oral/Motor/Sensory Function Overall Oral Motor/Sensory Function: Impaired Labial ROM: Reduced left Labial Symmetry: Abnormal symmetry left Labial Strength: Reduced Labial Sensation: Within Functional Limits Lingual ROM: Reduced left Lingual Symmetry: Abnormal symmetry left Lingual Strength: Reduced Lingual Sensation: Reduced Facial ROM: Reduced left Facial Symmetry: Left droop Facial Strength: Reduced   Ice Chips     Thin Liquid Thin Liquid: Impaired Presentation: Cup;Straw Oral Phase Impairments: Reduced labial seal;Reduced lingual movement/coordination Oral Phase Functional Implications: Left anterior spillage;Left lateral sulci pocketing;Oral residue Pharyngeal  Phase Impairments: Suspected delayed Swallow;Cough - Immediate    Nectar Thick Nectar Thick Liquid: Not tested   Honey Thick Honey Thick Liquid: Not tested   Puree Puree: Impaired Presentation: Spoon Oral Phase Impairments: Reduced labial seal;Reduced lingual movement/coordination Oral Phase Functional Implications: Left anterior spillage;Prolonged oral transit;Oral residue;Left lateral sulci pocketing   Solid   GO    Solid: Impaired Presentation: Self Fed Oral Phase Impairments: Reduced lingual movement/coordination;Impaired anterior to posterior transit Oral Phase Functional Implications: Left anterior spillage;Left lateral sulci  pocketing;Oral residue      [facility], MA CCC-SLP [phone]  Charlane Westry, Riley Nearing 07/12/2013,10:15 AM

## 2013-07-12 NOTE — Progress Notes (Signed)
VASCULAR LAB PRELIMINARY  PRELIMINARY  PRELIMINARY  PRELIMINARY  Carotid duplex  completed.    Preliminary report:  Bilateral:  1-39% ICA stenosis.  Vertebral artery flow is antegrade.      Colbie Danner, RVT 07/12/2013, 9:08 AM

## 2013-07-12 NOTE — Progress Notes (Signed)
UR completed.  Delois Tolbert, RN BSN [facility] CCM Trauma/Neuro ICU Case Manager [phone]  

## 2013-07-12 NOTE — Consult Note (Signed)
Physical Medicine and Rehabilitation Consult Reason for Consult: Suspect CVA Referring Physician: Dr. Pearlean Brownie   HPI: Jessica Myers is a 79 y.o. right-handed female with history of hyperlipidemia as well as hypertension. Patient independent and active prior to admission living with her husband. Admitted 07/11/2013 with left-sided weakness and slurred speech. Cranial CT scan unremarkable. Patient did receive TPA. Carotid Dopplers with no ICA stenosis. Echocardiogram pending. MRI MRA of the head pending. Neurology services consulted with full workup ongoing. Physical and occupational therapy evaluations pending. M.D. is requested physical medicine rehabilitation consult to consider inpatient rehabilitation services.   Review of Systems  Gastrointestinal:       GERD  Musculoskeletal: Positive for myalgias.  Neurological:       Vertigo  All other systems reviewed and are negative.   Past Medical History  Diagnosis Date  . Hypertension   . Vertical vertigo   . Thyroid disease   . High cholesterol    Past Surgical History  Procedure Laterality Date  . Cholecystectomy     No family history on file. Social History:  reports that she has never smoked. She does not have any smokeless tobacco history on file. She reports that she does not drink alcohol or use illicit drugs. Allergies:  Allergies  Allergen Reactions  . Penicillins     Hives   . Shellfish Allergy     hives   Medications Prior to Admission  Medication Sig Dispense Refill  . ALPRAZolam (XANAX) 0.5 MG tablet Take 0.5 mg by mouth 3 (three) times daily.      Marland Kitchen atorvastatin (LIPITOR) 10 MG tablet Take 10 mg by mouth daily.      Marland Kitchen levothyroxine (SYNTHROID, LEVOTHROID) 100 MCG tablet Take 100 mcg by mouth daily.      Marland Kitchen lisinopril (PRINIVIL,ZESTRIL) 10 MG tablet Take 10 mg by mouth 2 (two) times daily.      . meclizine (ANTIVERT) 25 MG tablet Take 25 mg by mouth 3 (three) times daily as needed for dizziness.      . metoprolol  succinate (TOPROL-XL) 25 MG 24 hr tablet Take 25 mg by mouth 2 (two) times daily.      Marland Kitchen omeprazole (PRILOSEC) 20 MG capsule Take 20 mg by mouth daily.        Home: Home Living Family/patient expects to be discharged to:: Private residence Living Arrangements: Spouse/significant other  Functional History:   Functional Status:  Mobility:          ADL:    Cognition: Cognition Orientation Level: Oriented X4    Blood pressure 144/57, pulse 71, temperature 98.2 F (36.8 C), temperature source Oral, resp. rate 18, height 5\' 5"  (1.651 m), weight 65 kg (143 lb 4.8 oz), SpO2 97.00%. Physical Exam  Vitals reviewed. Constitutional: She is oriented to person, place, and time.  Eyes: EOM are normal.  Neck: Normal range of motion. Neck supple. No thyromegaly present.  Cardiovascular: Normal rate and regular rhythm.   Pulmonary/Chest: Effort normal and breath sounds normal. No respiratory distress.  Abdominal: Bowel sounds are normal. She exhibits no distension.  Neurological: She is alert and oriented to person, place, and time.  Follows simple commands. She does have a right gaze preference and mild right facial droop. RUE 4+/5. LUE 4 to 4+. LE's 3/5 prox to 4/5 distally. No gross sensory abnormalities. Cognitively displays reasonable insight and awareness.   Skin: Skin is warm and dry.  Psychiatric: She has a normal mood and affect. Her behavior is normal. Judgment  and thought content normal.    Results for orders placed during the hospital encounter of 07/11/13 (from the past 24 hour(s))  ETHANOL     Status: None   Collection Time    07/11/13  7:48 PM      Result Value Range   Alcohol, Ethyl (B) <11  0 - 11 mg/dL  PROTIME-INR     Status: None   Collection Time    07/11/13  7:48 PM      Result Value Range   Prothrombin Time 12.7  11.6 - 15.2 seconds   INR 0.97  0.00 - 1.49  APTT     Status: Abnormal   Collection Time    07/11/13  7:48 PM      Result Value Range   aPTT 42  (*) 24 - 37 seconds  CBC     Status: Abnormal   Collection Time    07/11/13  7:48 PM      Result Value Range   WBC 5.1  4.0 - 10.5 K/uL   RBC 3.59 (*) 3.87 - 5.11 MIL/uL   Hemoglobin 10.7 (*) 12.0 - 15.0 g/dL   HCT 16.1 (*) 09.6 - 04.5 %   MCV 91.4  78.0 - 100.0 fL   MCH 29.8  26.0 - 34.0 pg   MCHC 32.6  30.0 - 36.0 g/dL   RDW 40.9  81.1 - 91.4 %   Platelets 142 (*) 150 - 400 K/uL  DIFFERENTIAL     Status: Abnormal   Collection Time    07/11/13  7:48 PM      Result Value Range   Neutrophils Relative % 45  43 - 77 %   Neutro Abs 2.3  1.7 - 7.7 K/uL   Lymphocytes Relative 38  12 - 46 %   Lymphs Abs 1.9  0.7 - 4.0 K/uL   Monocytes Relative 14 (*) 3 - 12 %   Monocytes Absolute 0.7  0.1 - 1.0 K/uL   Eosinophils Relative 3  0 - 5 %   Eosinophils Absolute 0.1  0.0 - 0.7 K/uL   Basophils Relative 0  0 - 1 %   Basophils Absolute 0.0  0.0 - 0.1 K/uL  COMPREHENSIVE METABOLIC PANEL     Status: Abnormal   Collection Time    07/11/13  7:48 PM      Result Value Range   Sodium 141  135 - 145 mEq/L   Potassium 4.0  3.5 - 5.1 mEq/L   Chloride 107  96 - 112 mEq/L   CO2 23  19 - 32 mEq/L   Glucose, Bld 102 (*) 70 - 99 mg/dL   BUN 14  6 - 23 mg/dL   Creatinine, Ser 7.82  0.50 - 1.10 mg/dL   Calcium 8.4  8.4 - 95.6 mg/dL   Total Protein 6.3  6.0 - 8.3 g/dL   Albumin 3.4 (*) 3.5 - 5.2 g/dL   AST 15  0 - 37 U/L   ALT 13  0 - 35 U/L   Alkaline Phosphatase 67  39 - 117 U/L   Total Bilirubin 0.7  0.3 - 1.2 mg/dL   GFR calc non Af Amer 46 (*) >90 mL/min   GFR calc Af Amer 53 (*) >90 mL/min  TROPONIN I     Status: None   Collection Time    07/11/13  7:48 PM      Result Value Range   Troponin I <0.30  <0.30 ng/mL  POCT I-STAT TROPONIN I  Status: None   Collection Time    07/11/13  7:56 PM      Result Value Range   Troponin i, poc 0.00  0.00 - 0.08 ng/mL   Comment 3           POCT I-STAT, CHEM 8     Status: Abnormal   Collection Time    07/11/13  7:58 PM      Result Value Range    Sodium 143  135 - 145 mEq/L   Potassium 4.0  3.5 - 5.1 mEq/L   Chloride 108  96 - 112 mEq/L   BUN 14  6 - 23 mg/dL   Creatinine, Ser 1.47  0.50 - 1.10 mg/dL   Glucose, Bld 829 (*) 70 - 99 mg/dL   Calcium, Ion 5.62  1.30 - 1.30 mmol/L   TCO2 25  0 - 100 mmol/L   Hemoglobin 10.9 (*) 12.0 - 15.0 g/dL   HCT 86.5 (*) 78.4 - 69.6 %  GLUCOSE, CAPILLARY     Status: Abnormal   Collection Time    07/11/13  8:10 PM      Result Value Range   Glucose-Capillary 110 (*) 70 - 99 mg/dL  URINE RAPID DRUG SCREEN (HOSP PERFORMED)     Status: None   Collection Time    07/11/13  8:31 PM      Result Value Range   Opiates NONE DETECTED  NONE DETECTED   Cocaine NONE DETECTED  NONE DETECTED   Benzodiazepines NONE DETECTED  NONE DETECTED   Amphetamines NONE DETECTED  NONE DETECTED   Tetrahydrocannabinol NONE DETECTED  NONE DETECTED   Barbiturates NONE DETECTED  NONE DETECTED  URINALYSIS, ROUTINE W REFLEX MICROSCOPIC     Status: None   Collection Time    07/11/13  8:31 PM      Result Value Range   Color, Urine YELLOW  YELLOW   APPearance CLEAR  CLEAR   Specific Gravity, Urine 1.014  1.005 - 1.030   pH 5.5  5.0 - 8.0   Glucose, UA NEGATIVE  NEGATIVE mg/dL   Hgb urine dipstick NEGATIVE  NEGATIVE   Bilirubin Urine NEGATIVE  NEGATIVE   Ketones, ur NEGATIVE  NEGATIVE mg/dL   Protein, ur NEGATIVE  NEGATIVE mg/dL   Urobilinogen, UA 1.0  0.0 - 1.0 mg/dL   Nitrite NEGATIVE  NEGATIVE   Leukocytes, UA NEGATIVE  NEGATIVE  MRSA PCR SCREENING     Status: None   Collection Time    07/11/13  9:28 PM      Result Value Range   MRSA by PCR NEGATIVE  NEGATIVE  LIPID PANEL     Status: None   Collection Time    07/12/13  6:40 AM      Result Value Range   Cholesterol 136  0 - 200 mg/dL   Triglycerides 71  <295 mg/dL   HDL 55  >28 mg/dL   Total CHOL/HDL Ratio 2.5     VLDL 14  0 - 40 mg/dL   LDL Cholesterol 67  0 - 99 mg/dL   Ct Head Wo Contrast  07/11/2013   CLINICAL DATA:  Slurred speech.  EXAM: CT HEAD  WITHOUT CONTRAST  TECHNIQUE: Contiguous axial images were obtained from the base of the skull through the vertex without intravenous contrast.  COMPARISON:  MR dated 01/28/2013.  FINDINGS: Minimally enlarged ventricles and subarachnoid spaces. Mild patchy white matter low density in both cerebral hemispheres. This remains more focal in the left frontal lobe. No  intracranial hemorrhage, mass lesion or CT evidence of acute infarction. Unremarkable bones and included paranasal sinuses.  IMPRESSION: No acute abnormality. Minimal atrophy and mild chronic small vessel white matter ischemic changes.  These results were called by telephone at the time of interpretation on 07/11/2013 at 8:10 PM to Dr. Dr. Vanetta Mulders , who verbally acknowledged these results.   Electronically Signed   By: Gordan Payment   On: 07/11/2013 20:11    Assessment/Plan: Diagnosis: ?small right subcortical infarct 1. Does the need for close, 24 hr/day medical supervision in concert with the patient's rehab needs make it unreasonable for this patient to be served in a less intensive setting? No and Potentially 2. Co-Morbidities requiring supervision/potential complications: htn 3. Due to bladder management, bowel management, safety, skin/wound care, disease management, medication administration, pain management and patient education, does the patient require 24 hr/day rehab nursing? Potentially 4. Does the patient require coordinated care of a physician, rehab nurse, PT (1-2 hrs/day, 5 days/week) and OT (1-2 hrs/day, 5 days/week) to address physical and functional deficits in the context of the above medical diagnosis(es)? No and Potentially Addressing deficits in the following areas: balance, endurance, locomotion, strength, transferring, bowel/bladder control, bathing, dressing, feeding, grooming and toileting 5. Can the patient actively participate in an intensive therapy program of at least 3 hrs of therapy per day at least 5 days per  week? Potentially 6. The potential for patient to make measurable gains while on inpatient rehab is fair 7. Anticipated functional outcomes upon discharge from inpatient rehab are supervision with PT, supervision with OT, supervision with SLP. 8. Estimated rehab length of stay to reach the above functional goals is: ?1 week if needed 9. Does the patient have adequate social supports to accommodate these discharge functional goals? Potentially 10. Anticipated D/C setting: Home 11. Anticipated post D/C treatments: HH therapy 12. Overall Rehab/Functional Prognosis: good  RECOMMENDATIONS: This patient's condition is appropriate for continued rehabilitative care in the following setting: [facility] (?SNF) Patient has agreed to participate in recommended program. Yes Note that insurance prior authorization may be required for reimbursement for recommended care.  Comment: Pt appears to have made neurological progress already. MRI and therapy pending  Ranelle Oyster, MD, [facility] Physical Medicine & Rehabilitation     07/12/2013

## 2013-07-13 DIAGNOSIS — M6281 Muscle weakness (generalized): Secondary | ICD-10-CM

## 2013-07-13 MED ORDER — PANTOPRAZOLE SODIUM 40 MG PO TBEC: 40.0000 mg | DELAYED_RELEASE_TABLET | Freq: Every day | ORAL | Status: DC

## 2013-07-13 MED ORDER — MECLIZINE HCL 25 MG PO TABS
25.0000 mg | ORAL_TABLET | Freq: Three times a day (TID) | ORAL | Status: DC | PRN
  Filled 2013-07-13: qty 1

## 2013-07-13 MED ORDER — PANTOPRAZOLE SODIUM 40 MG PO TBEC
40.0000 mg | DELAYED_RELEASE_TABLET | Freq: Every day | ORAL | Status: DC
  Administered 2013-07-13 – 2013-07-16 (×4): 40 mg via ORAL
  Filled 2013-07-13 (×3): qty 1

## 2013-07-13 MED ORDER — LISINOPRIL 10 MG PO TABS
10.0000 mg | ORAL_TABLET | Freq: Two times a day (BID) | ORAL | Status: DC
  Administered 2013-07-13 – 2013-07-16 (×6): 10 mg via ORAL
  Filled 2013-07-13 (×8): qty 1

## 2013-07-13 MED ORDER — ENOXAPARIN SODIUM 40 MG/0.4ML ~~LOC~~ SOLN
40.0000 mg | SUBCUTANEOUS | Status: DC
  Administered 2013-07-13 – 2013-07-15 (×3): 40 mg via SUBCUTANEOUS
  Filled 2013-07-13 (×4): qty 0.4

## 2013-07-13 MED ORDER — METOPROLOL SUCCINATE ER 25 MG PO TB24
25.0000 mg | ORAL_TABLET | Freq: Two times a day (BID) | ORAL | Status: DC
  Administered 2013-07-13 – 2013-07-16 (×6): 25 mg via ORAL
  Filled 2013-07-13 (×8): qty 1

## 2013-07-13 MED ORDER — ASPIRIN 325 MG PO TABS
325.0000 mg | ORAL_TABLET | Freq: Every day | ORAL | Status: DC
  Administered 2013-07-13 – 2013-07-16 (×4): 325 mg via ORAL
  Filled 2013-07-13 (×3): qty 1

## 2013-07-13 NOTE — Progress Notes (Signed)
Patient requested that her .25 mg Xanax be broken in half and that she wanted to take it and hold on to other half after she saw how it worked. Upon transfer to [facility] patient requested other half of .25 mg Xanax which was taken  at that time. Patient and husband very pleased and thankful for the care received on unit.

## 2013-07-13 NOTE — Progress Notes (Signed)
Patient experiencing irregular heart rhythm with prolonged PR intervals and missed beats.  Blood pressure and other vital signs remain stable.  Patient arousable and remains neuro intact, pupils 2 round equal and brisk.  Dr. Leroy Kennedy paged and made aware of change in heart rhythm.  No new orders at this time.  Will continue to monitor. Jessica Myers

## 2013-07-13 NOTE — Progress Notes (Signed)
Stroke Team Progress Note  HISTORY Jessica Myers is an 79 y.o. female, right handed, with a past medical history significant for HTN, hyperlipidemia, GERD, brought to [facility] ED by medics as a code stroke due to acute onset left hemiparesis, left face weakness, and dysarthria.  She was last known well at 1850 pm today, doing some work at her computer, when her husband noticed that she was having slurred speech, weakness of the left face, and inability to use the left side of her body. Ambulance called and upon initial evaluation in the ED she had NIHSS 16.   CT brain revealed no acute intracranial abnormality.   Of importance, family report that couple of weeks ago she had transient slurred speech and had a brain MRI that was normal  Date last known well: 07/11/13  Time last known well: 1850 pm  tPA Given: yes  NIHSS: 16  MRS: 3   SUBJECTIVE No family is at bedside. Patient is bright and alert, she feels that her strength has significantly improved. No reports of numbness, headache, nausea, visual disturbance.  OBJECTIVE Most recent Vital Signs: Filed Vitals:   07/13/13 0300 07/13/13 0400 07/13/13 0500 07/13/13 0600  BP: 120/51 137/62 133/55 130/51  Pulse: 52 55 49 50  Temp: 97.7 F (36.5 C)     TempSrc: Oral     Resp: 16 17 15 14   Height:      Weight:      SpO2: 100% 100% 100% 100%   CBG (last 3)   Recent Labs  07/11/13 2010  GLUCAP 110*    IV Fluid Intake:   . sodium chloride 75 mL/hr at 07/13/13 0600    MEDICATIONS  . ALPRAZolam  0.5 mg Oral NOW  . atorvastatin  10 mg Oral q1800  . levothyroxine  100 mcg Oral QAC breakfast  . pantoprazole (PROTONIX) IV  40 mg Intravenous QHS   PRN:  acetaminophen, acetaminophen, ALPRAZolam, labetalol, senna-docusate  Diet:  Dysphagia  Activity:  Bedrest  DVT Prophylaxis:  SCD  CLINICALLY SIGNIFICANT STUDIES Basic Metabolic Panel:   Recent Labs Lab 07/11/13 1948 07/11/13 1958  NA 141 143  K 4.0 4.0  CL 107 108  CO2 23   --   GLUCOSE 102* 100*  BUN 14 14  CREATININE 1.09 1.10  CALCIUM 8.4  --    Liver Function Tests:   Recent Labs Lab 07/11/13 1948  AST 15  ALT 13  ALKPHOS 67  BILITOT 0.7  PROT 6.3  ALBUMIN 3.4*   CBC:   Recent Labs Lab 07/11/13 1948 07/11/13 1958  WBC 5.1  --   NEUTROABS 2.3  --   HGB 10.7* 10.9*  HCT 32.8* 32.0*  MCV 91.4  --   PLT 142*  --    Coagulation:   Recent Labs Lab 07/11/13 1948  LABPROT 12.7  INR 0.97   Cardiac Enzymes:   Recent Labs Lab 07/11/13 1948  TROPONINI <0.30   Urinalysis:   Recent Labs Lab 07/11/13 2031  COLORURINE YELLOW  LABSPEC 1.014  PHURINE 5.5  GLUCOSEU NEGATIVE  HGBUR NEGATIVE  BILIRUBINUR NEGATIVE  KETONESUR NEGATIVE  PROTEINUR NEGATIVE  UROBILINOGEN 1.0  NITRITE NEGATIVE  LEUKOCYTESUR NEGATIVE   Lipid Panel    Component Value Date/Time   CHOL 136 07/12/2013 0640   TRIG 71 07/12/2013 0640   HDL 55 07/12/2013 0640   CHOLHDL 2.5 07/12/2013 0640   VLDL 14 07/12/2013 0640   LDLCALC 67 07/12/2013 0640   HgbA1C  Lab Results  Component Value Date   HGBA1C 5.2 07/12/2013    Urine Drug Screen:     Component Value Date/Time   LABOPIA NONE DETECTED 07/11/2013 2031   COCAINSCRNUR NONE DETECTED 07/11/2013 2031   LABBENZ NONE DETECTED 07/11/2013 2031   AMPHETMU NONE DETECTED 07/11/2013 2031   THCU NONE DETECTED 07/11/2013 2031   LABBARB NONE DETECTED 07/11/2013 2031    Alcohol Level:   Recent Labs Lab 07/11/13 1948  ETH <11    Ct Head Wo Contrast 07/11/2013   : No acute abnormality. Minimal atrophy and mild chronic small vessel white matter ischemic changes.   MRI of the brain   IMPRESSION:  Acute right MCA lenticulostriate territory infarct. Slight central  hemorrhage.   MRA of the brain   IMPRESSION:  Intracranial atherosclerotic changes as described. 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  2D Echocardiogram  Results pending  Carotid Doppler  Bilateral: 1-39% ICA stenosis. Vertebral artery flow is antegrade  CXR    EKG  normal sinus rhythm.   Therapy Recommendations physical therapy evaluation is pending, rehabilitation consult has seen,possible recommendations for home health physical therapyical Exam  General: The patient is alert and cooperative at the time of the examination.  Respiratory: Lungs fields are clear.  Abdomen: Positive bowel sounds, nontender  Skin: No significant peripheral edema is noted.   Neurologic Exam  Cranial nerves: Facial symmetry is present. Speech is notable for a mild dysarthria. Visual fields are full.  Motor: The patient has good strength in all 4 extremities. Mild drift is noted with the left upper extremity.  Coordination: The patient has good finger-nose-finger and heel-to-shin bilaterally.  Gait and station: The gait was not tested.  Reflexes: Deep tendon reflexes are symmetric.     ASSESSMENT Jessica Myers is a 79 y.o. female presenting with left hemiparesis, dysarthria. Status post IV t-PA full dose on 07/11/2013 at 2022. Imaging does not show an acute infarct. Infarct felt to be embolic secondary to unknown source.  On no antithrombotics prior to admission. Now on Aspirin for secondary stroke prevention. The hemiparesis is much improved.    Hypertension  Hyperlipidemia, LDL 71 on lipitor, continue statin.  Left hemiparesis  Dysarthria  Hypothyroidism  Long term medication use  Hospital day # 2  TREATMENT/PLAN  If follow up imaging stable, start aspirin 325 mg orally every day for secondary stroke prevention.  Risk factor management  Therapy evaluations Await PT eval and recommedations  2 D echo report pending  Transfer to 4N, mobilize patient  Consider outpatient Cardionet monitor   Lesly Dukes   07/13/2013, 8:00 AM Certified in Neurology, Neurophysiology and  Neuroimaging [facility] - Stroke Team  Please refer to [email or web address] for on-call Stroke MD

## 2013-07-13 NOTE — Evaluation (Signed)
Physical Therapy Evaluation Patient Details Name: Jessica Myers MRN: [id number] DOB: [birth year] Today's Date: 07/13/2013 Time: 6578-4696 PT Time Calculation (min): 28 min  PT Assessment / Plan / Recommendation History of Present Illness  pt presents with L sided weakness and slurred speech.  pt given tpa in ED with improvement of symptoms.    Clinical Impression  Pt very motivated to return to PLOF, but a little anxious about mobility at this time.  Gave pt LE coordination exercises to perform independently.  Feel pt will make great progress and benefit from OPPT for high level balance at D/C.      PT Assessment  Patient needs continued PT services    Follow Up Recommendations  Outpatient PT;Supervision - Intermittent    Does the patient have the potential to tolerate intense rehabilitation      Barriers to Discharge        Equipment Recommendations  None recommended by PT    Recommendations for Other Services     Frequency Min 4X/week    Precautions / Restrictions Precautions Precautions: Fall Restrictions Weight Bearing Restrictions: No   Pertinent Vitals/Pain Denies pain.        Mobility  Bed Mobility Bed Mobility: Not assessed Transfers Transfers: Sit to Stand;Stand to Sit Sit to Stand: 4: Min guard;With upper extremity assist;From bed Stand to Sit: 4: Min guard;With upper extremity assist;To chair/3-in-1 Details for Transfer Assistance: pt notes she feels nervous, but only requires guarding for safety.  Demos good use of UEs.   Ambulation/Gait Ambulation/Gait Assistance: 4: Min guard Ambulation Distance (Feet): 150 Feet Assistive device: None Ambulation/Gait Assistance Details: pt moves slowly and cautiously, but no LOB or gait difficulties.  pt does indicate L LE feels stiff and fatigues more quickly during gait.   Gait Pattern: Step-through pattern;Decreased stride length Stairs: No Wheelchair Mobility Wheelchair Mobility: No Modified Rankin (Stroke  Patients Only) Pre-Morbid Rankin Score: No symptoms Modified Rankin: Moderately severe disability    Exercises     PT Diagnosis: Difficulty walking  PT Problem List: Decreased strength;Decreased activity tolerance;Decreased balance;Decreased mobility;Decreased coordination;Decreased cognition;Decreased knowledge of use of DME PT Treatment Interventions: DME instruction;Gait training;Stair training;Functional mobility training;Therapeutic activities;Therapeutic exercise;Balance training;Neuromuscular re-education;Cognitive remediation;Patient/family education     PT Goals(Current goals can be found in the care plan section) Acute Rehab PT Goals Patient Stated Goal: Back to line dancing.   PT Goal Formulation: With patient Time For Goal Achievement: 07/27/13 Potential to Achieve Goals: Good  Visit Information  Last PT Received On: 07/13/13 Assistance Needed: +1 History of Present Illness: pt presents with L sided weakness and slurred speech.  pt given tpa in ED with improvement of symptoms.         Prior Functioning  Home Living Family/patient expects to be discharged to:: Private residence Living Arrangements: Spouse/significant other Available Help at Discharge: Family;Available 24 hours/day Type of Home: House Home Access: Stairs to enter [employer] of Steps: 13 Entrance Stairs-Rails: Right Home Layout: One level;Laundry or work area in [employer]: None Prior Function Level of Independence: Independent Communication Communication: No difficulties Dominant Hand: Right    Cognition  Cognition Arousal/Alertness: Awake/alert Behavior During Therapy: WFL for tasks assessed/performed Overall Cognitive Status: Impaired/Different from baseline Area of Impairment: Memory;Problem solving Memory: Decreased short-term memory Problem Solving: Slow processing;Difficulty sequencing    Extremity/Trunk Assessment Upper Extremity Assessment Upper Extremity  Assessment: Defer to OT evaluation Lower Extremity Assessment Lower Extremity Assessment: LLE deficits/detail LLE Deficits / Details: Mild weakness, 4/5 LLE Coordination: decreased fine  motor Cervical / Trunk Assessment Cervical / Trunk Assessment: Normal   Balance Balance Balance Assessed: Yes Static Standing Balance Static Standing - Balance Support: No upper extremity supported Static Standing - Level of Assistance: 5: Stand by assistance  End of Session PT - End of Session Equipment Utilized During Treatment: Gait belt Activity Tolerance: Patient tolerated treatment well Patient left: in chair;with call bell/phone within reach Nurse Communication: Mobility status  GP     Sunny Schlein, [place] [phone] 07/13/2013, 9:28 AM

## 2013-07-14 DIAGNOSIS — I359 Nonrheumatic aortic valve disorder, unspecified: Secondary | ICD-10-CM

## 2013-07-14 LAB — BASIC METABOLIC PANEL
BUN: 14 mg/dL (ref 6–23)
CO2: 27 mEq/L (ref 19–32)
Chloride: 108 mEq/L (ref 96–112)
Creatinine, Ser: 1.29 mg/dL — ABNORMAL HIGH (ref 0.50–1.10)
Sodium: 142 mEq/L (ref 135–145)

## 2013-07-14 LAB — CBC WITH DIFFERENTIAL/PLATELET
Basophils Absolute: 0 10*3/uL (ref 0.0–0.1)
Basophils Relative: 0 % (ref 0–1)
Eosinophils Relative: 3 % (ref 0–5)
HCT: 31.1 % — ABNORMAL LOW (ref 36.0–46.0)
Lymphocytes Relative: 25 % (ref 12–46)
Lymphs Abs: 1.5 10*3/uL (ref 0.7–4.0)
MCHC: 33.1 g/dL (ref 30.0–36.0)
Monocytes Absolute: 0.7 10*3/uL (ref 0.1–1.0)
Neutro Abs: 3.7 10*3/uL (ref 1.7–7.7)
Neutrophils Relative %: 61 % (ref 43–77)
Platelets: 128 10*3/uL — ABNORMAL LOW (ref 150–400)
RBC: 3.41 MIL/uL — ABNORMAL LOW (ref 3.87–5.11)
RDW: 12.9 % (ref 11.5–15.5)
WBC: 6.1 10*3/uL (ref 4.0–10.5)

## 2013-07-14 NOTE — Progress Notes (Signed)
  Echocardiogram 2D Echocardiogram has been performed.  Georgian Co 07/14/2013, 9:05 AM

## 2013-07-14 NOTE — Progress Notes (Signed)
Stroke Team Progress Note  HISTORY Jessica Myers is an 79 y.o. female, right handed, with a past medical history significant for HTN, hyperlipidemia, GERD, brought to [facility] ED by medics as a code stroke due to acute onset left hemiparesis, left face weakness, and dysarthria.  She was last known well at 1850 pm today, doing some work at her computer, when her husband noticed that she was having slurred speech, weakness of the left face, and inability to use the left side of her body. Ambulance called and upon initial evaluation in the ED she had NIHSS 16.   CT brain revealed no acute intracranial abnormality.   Of importance, family report that couple of weeks ago she had transient slurred speech and had a brain MRI that was normal  Date last known well: 07/11/13  Time last known well: 1850 pm  tPA Given: yes  NIHSS: 16  MRS: 3   SUBJECTIVE   OBJECTIVE Most recent Vital Signs: Filed Vitals:   07/13/13 2200 07/14/13 0200 07/14/13 0600 07/14/13 1014  BP: 133/55 155/59 159/69 153/57  Pulse: 68 57 65 63  Temp: 98.2 F (36.8 C) 98 F (36.7 C) 98.6 F (37 C) 98.1 F (36.7 C)  TempSrc:    Oral  Resp: 16 16 16 18   Height:      Weight:      SpO2: 96% 100% 99% 99%   CBG (last 3)   Recent Labs  07/11/13 2010  GLUCAP 110*    IV Fluid Intake:      MEDICATIONS  . aspirin  325 mg Oral Daily  . atorvastatin  10 mg Oral q1800  . enoxaparin (LOVENOX) injection  40 mg Subcutaneous Q24H  . levothyroxine  100 mcg Oral QAC breakfast  . lisinopril  10 mg Oral BID  . metoprolol succinate  25 mg Oral BID  . pantoprazole  40 mg Oral Daily   PRN:  acetaminophen, acetaminophen, ALPRAZolam, labetalol, meclizine, senna-docusate  Diet:  Dysphagia 3 with thin liquids Activity:  Bedrest  DVT Prophylaxis:  SCD  CLINICALLY SIGNIFICANT STUDIES Basic Metabolic Panel:   Recent Labs Lab 07/11/13 1948 07/11/13 1958 07/14/13 0546  NA 141 143 142  K 4.0 4.0 3.7  CL 107 108 108  CO2 23  --   27  GLUCOSE 102* 100* 85  BUN 14 14 14   CREATININE 1.09 1.10 1.29*  CALCIUM 8.4  --  8.8   Liver Function Tests:   Recent Labs Lab 07/11/13 1948  AST 15  ALT 13  ALKPHOS 67  BILITOT 0.7  PROT 6.3  ALBUMIN 3.4*   CBC:   Recent Labs Lab 07/11/13 1948 07/11/13 1958 07/14/13 0546  WBC 5.1  --  6.1  NEUTROABS 2.3  --  3.7  HGB 10.7* 10.9* 10.3*  HCT 32.8* 32.0* 31.1*  MCV 91.4  --  91.2  PLT 142*  --  128*   Coagulation:   Recent Labs Lab 07/11/13 1948  LABPROT 12.7  INR 0.97   Cardiac Enzymes:   Recent Labs Lab 07/11/13 1948  TROPONINI <0.30   Urinalysis:   Recent Labs Lab 07/11/13 2031  COLORURINE YELLOW  LABSPEC 1.014  PHURINE 5.5  GLUCOSEU NEGATIVE  HGBUR NEGATIVE  BILIRUBINUR NEGATIVE  KETONESUR NEGATIVE  PROTEINUR NEGATIVE  UROBILINOGEN 1.0  NITRITE NEGATIVE  LEUKOCYTESUR NEGATIVE   Lipid Panel    Component Value Date/Time   CHOL 136 07/12/2013 0640   TRIG 71 07/12/2013 0640   HDL 55 07/12/2013 0640  CHOLHDL 2.5 07/12/2013 0640   VLDL 14 07/12/2013 0640   LDLCALC 67 07/12/2013 0640   HgbA1C  Lab Results  Component Value Date   HGBA1C 5.2 07/12/2013    Urine Drug Screen:     Component Value Date/Time   LABOPIA NONE DETECTED 07/11/2013 2031   COCAINSCRNUR NONE DETECTED 07/11/2013 2031   LABBENZ NONE DETECTED 07/11/2013 2031   AMPHETMU NONE DETECTED 07/11/2013 2031   THCU NONE DETECTED 07/11/2013 2031   LABBARB NONE DETECTED 07/11/2013 2031    Alcohol Level:   Recent Labs Lab 07/11/13 1948  ETH <11    Ct Head Wo Contrast 07/11/2013   : No acute abnormality. Minimal atrophy and mild chronic small vessel white matter ischemic changes.   MRI of the brain   IMPRESSION:  Acute right MCA lenticulostriate territory infarct. Slight central  hemorrhage.   MRA of the brain   IMPRESSION:  Intracranial atherosclerotic changes as described. No significant  M1 stenosis on the right, but moderately severe right M2 disease is  identified;  These lesions are distal to the observed pattern of  acute infarction.  2D Echocardiogram  Results pending  Carotid Doppler  Bilateral: 1-39% ICA stenosis. Vertebral artery flow is antegrade  CXR    EKG  normal sinus rhythm.   Therapy Recommendations physical therapy evaluation is pending, rehabilitation consult has seen,possible recommendations for home health physical therapyical Exam  General: The patient is alert and cooperative at the time of the examination.  Respiratory: Lungs fields are clear.  Abdomen: Positive bowel sounds, nontender  Skin: No significant peripheral edema is noted.   Neurologic Exam  Cranial nerves: Facial symmetry is present. Speech is notable for a mild dysarthria. Visual fields are full.  Motor: The patient has good strength in all 4 extremities. Mild drift is noted with the left upper extremity.  Coordination: The patient has good finger-nose-finger and heel-to-shin bilaterally.  Gait and station: The gait was not tested.  Reflexes: Deep tendon reflexes are symmetric.     ASSESSMENT Jessica Myers is a 79 y.o. female presenting with left hemiparesis, dysarthria. Status post IV t-PA full dose on 07/11/2013 at 2022. Imaging does not show an acute infarct. Infarct felt to be embolic secondary to unknown source.  On no antithrombotics prior to admission. Now on Aspirin for secondary stroke prevention. The hemiparesis is much improved.    Hypertension  Hyperlipidemia, LDL 71 on lipitor, continue statin.  Left hemiparesis  Dysarthria  Hypothyroidism  Long term medication use  Hospital day # 3  TREATMENT/PLAN  If follow up imaging stable, start aspirin 325 mg orally every day for secondary stroke prevention.  Risk factor management  Outpatient physical therapy recommended  2 D echo report pending I am not clear that the patient needs a TEE  Consider outpatient Cardionet monitor /the patient reports frequent palpitations,  had a prolonged monitor for 3 weeks 2 years ago, did not show Afib   Delton See PA-C [facility] Pager [phone] 07/14/2013, 11:33 AM      07/14/2013, 11:33 AM Certified in Neurology, Neurophysiology and Neuroimaging [facility] - Stroke Team  Please refer to [email or web address] for on-call   Lesly Dukes

## 2013-07-15 NOTE — Progress Notes (Signed)
Pt doing well-PT recommends OP f/up.  CIR will sign off.  [phone]

## 2013-07-15 NOTE — Evaluation (Addendum)
Occupational Therapy Evaluation Patient Details Name: Jessica Myers MRN: [id number] DOB: 03-25-1929 Today's Date: 07/15/2013 Time: 5621-3086 OT Time Calculation (min): 34 min  OT Assessment / Plan / Recommendation History of present illness pt presents with L sided weakness and slurred speech.  pt given tpa in ED with improvement of symptoms.   Found Acute Rt. MCA lenticulostriate territory infarct and slight central hemorrage.    Clinical Impression   Pt presents with below problem list. Pt independent with ADLs, PTA.  Pt will benefit from acute OT to increase independence prior to d/c. Educated daughter during session as well.     OT Assessment  Patient needs continued OT Services    Follow Up Recommendations  Supervision/Assistance - 24 hour;No OT follow up    Barriers to Discharge      Equipment Recommendations  3 in 1 bedside comode;Other (comment) (may also need shower chair)    Recommendations for Other Services    Frequency  Min 2X/week    Precautions / Restrictions Precautions Precautions: Fall Restrictions Weight Bearing Restrictions: No   Pertinent Vitals/Pain No pain reported.     ADL  Grooming: Wash/dry hands;Supervision/safety Where Assessed - Grooming: Supported standing Upper Body Bathing: Set up;Supervision/safety Where Assessed - Upper Body Bathing: Unsupported sitting Lower Body Bathing: Min guard Where Assessed - Lower Body Bathing: Unsupported sit to stand Upper Body Dressing: Set up;Supervision/safety Where Assessed - Upper Body Dressing: Unsupported sitting Lower Body Dressing: Min guard Where Assessed - Lower Body Dressing: Unsupported sit to stand Toilet Transfer: Min guard;Supervision/safety (supervision-comfort height and min guard-regular height) Toilet Transfer Method: Sit to Barista: Regular height toilet;Comfort height toilet Toileting - Clothing Manipulation and Hygiene: Supervision/safety Where Assessed -  Engineer, mining and Hygiene: Sit to stand from 3-in-1 or toilet Tub/Shower Transfer: Performed;Moderate assistance;Minimal assistance Tub/Shower Transfer Method: Science writer: Shower seat with back Equipment Used: Gait belt Transfers/Ambulation Related to ADLs: Min guard for ambulation. Min guard/supervision for transfers. ADL Comments: OT educated on dressing technique. Pt practiced tub transfer. She loves to take a tub bath and sit down in tub. Pt practiced doing this but required a lot of assistance with task. Practiced stepping over by holding hand and having pt sit on shower chair-pt at Min A level for balance and to hold hand. Pt also tried getting down in tub-heavy Mod A. Recommended sitting on shower chair to bathe. Pt states her tub is bigger and she thinks it would be easier with hers at home. OT educated to stand in front of bed/chair when pulling up LB clothing.  Educated on safe shoes.     OT Diagnosis: Hemiplegia non-dominant side;Cognitive deficits  OT Problem List: Decreased strength;Decreased activity tolerance;Impaired balance (sitting and/or standing);Decreased safety awareness;Decreased knowledge of use of DME or AE;Decreased knowledge of precautions;Decreased cognition OT Treatment Interventions: Self-care/ADL training;Neuromuscular education;DME and/or AE instruction;Therapeutic activities;Patient/family education;Balance training;Cognitive remediation/compensation   OT Goals(Current goals can be found in the care plan section) Acute Rehab OT Goals Patient Stated Goal: Get stronger and go home OT Goal Formulation: With patient Time For Goal Achievement: 07/22/13 Potential to Achieve Goals: Good ADL Goals Pt Will Perform Lower Body Bathing: with modified independence;sit to/from stand Pt Will Perform Lower Body Dressing: with modified independence;sit to/from stand Pt Will Transfer to Toilet: with modified independence;ambulating  (3 in 1 over commode) Pt Will Perform Toileting - Clothing Manipulation and hygiene: with modified independence;sit to/from stand Pt Will Perform Tub/Shower Transfer: Tub transfer;with supervision;ambulating;shower seat  Visit  Information  Last OT Received On: 07/15/13 Assistance Needed: +1 History of Present Illness: pt presents with L sided weakness and slurred speech.  pt given tpa in ED with improvement of symptoms.         Prior Functioning     Home Living Family/patient expects to be discharged to:: Private residence Living Arrangements: Spouse/significant other Available Help at Discharge: Family;Available 24 hours/day Type of Home: House Home Access: Stairs to enter [employer] of Steps: 13 Entrance Stairs-Rails: Right Home Layout: One level;Laundry or work area in [employer]: None  Lives With: Spouse Prior Function Level of Independence: Independent Communication Communication: No difficulties Dominant Hand: Right         Vision/Perception Vision - History Patient Visual Report: No change from baseline Vision - Assessment Vision Assessment: Vision tested Tracking/Visual Pursuits: Able to track stimulus in all quads without difficulty Visual Fields: No apparent deficits   Cognition  Cognition Arousal/Alertness: Awake/alert Behavior During Therapy: WFL for tasks assessed/performed Overall Cognitive Status: Impaired/Different from baseline Area of Impairment: Problem solving;Safety/judgement Safety/Judgement: Decreased awareness of safety Problem Solving: Difficulty sequencing;Requires verbal cues    Extremity/Trunk Assessment Upper Extremity Assessment Upper Extremity Assessment: LUE deficits/detail LUE Deficits / Details: approximately 90 degrees of AROM shoulder flexion     Mobility Bed Mobility Bed Mobility: Supine to Sit;Sit to Supine;Sitting - Scoot to [employer] of Bed;Scooting to HOB Supine to Sit: 5: Supervision Sitting -  Scoot to Edge of Bed: 5: Supervision Sit to Supine: 6: Modified independent (Device/Increase time) Scooting to [facility]: 5: Supervision Details for Bed Mobility Assistance: Cues to use rails to scoot HOB Transfers Transfers: Sit to Stand;Stand to Sit Sit to Stand: 4: Min guard;5: Supervision;From bed;From toilet Stand to Sit: 4: Min guard;5: Supervision;To bed;To toilet Details for Transfer Assistance: Min guard for sit <> stand to/from chair in shower. Min guard for sit <> stand to simulated regular height toilet. Supervision for sit <> stand to/from comfort height toilet and bed.      Exercise     Balance     End of Session OT - End of Session Equipment Utilized During Treatment: Gait belt Activity Tolerance: Patient tolerated treatment well Patient left: in bed;with call bell/phone within reach;with bed alarm set;with family/visitor present  GO     Earlie Raveling OTR/L [phone] 07/15/2013, 11:11 AM

## 2013-07-15 NOTE — Progress Notes (Signed)
Speech Language Pathology Dysphagia Treatment Patient Details Name: Jessica Myers MRN: [id number] DOB: 12/27/1928 Today's Date: 07/15/2013 Time: 7829-5621 SLP Time Calculation (min): 18 min  Assessment / Plan / Recommendation Clinical Impression  Pt demonstrating significant improvement since Friday's evalaution. Slurred speech has resolved. No evidence of persisting dysphagia. Pt able to take large consecutive sips of water without cough, in contrast to Friday. She does have midly decreased volume with speech, SLP offered min verbal cues with little change in volume but pt responded to contextual cues immediately (SLP standing across the room) and increased volume appropriately. Would not f/u with any outpatient SLP at this point. Pt may upgrade to regular diet with thin liquids. Will sign off.     Diet Recommendation  Initiate / Change Diet: Regular;Thin liquid    SLP Plan All goals met   Pertinent Vitals/Pain NA   Swallowing Goals  SLP Swallowing Goals Patient will utilize recommended strategies during swallow to increase swallowing safety with: Supervision/safety Swallow Study Goal #2 - Progress: Met  General Temperature Spikes Noted: No Respiratory Status: Room air Behavior/Cognition: Alert;Cooperative;Pleasant mood Oral Cavity - Dentition: Adequate natural dentition Patient Positioning: Upright in chair  Oral Cavity - Oral Hygiene Brush patient's teeth BID with toothbrush (using toothpaste with fluoride): Yes   Dysphagia Treatment Treatment focused on: Skilled observation of diet tolerance;Upgraded PO texture trials;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

## 2013-07-15 NOTE — Progress Notes (Signed)
Stroke Team Progress Note  HISTORY Jessica Myers is an 79 y.o. female, right handed, with a past medical history significant for HTN, hyperlipidemia, GERD, brought to [facility] ED by medics as a code stroke due to acute onset left hemiparesis, left face weakness, and dysarthria.  She was last known well at 1850 pm today, doing some work at her computer, when her husband noticed that she was having slurred speech, weakness of the left face, and inability to use the left side of her body. Ambulance called and upon initial evaluation in the ED she had NIHSS 16.   CT brain revealed no acute intracranial abnormality.   Of importance, family report that couple of weeks ago she had transient slurred speech and had a brain MRI that was normal  Date last known well: 07/11/13  Time last known well: 1850 pm  tPA Given: yes  NIHSS: 16  MRS: 3   SUBJECTIVE Patient's family at bedside. Patient feeling better. Had similar episode recently that was transient.  OBJECTIVE Most recent Vital Signs: Filed Vitals:   07/15/13 0600 07/15/13 0947 07/15/13 1349 07/15/13 1411  BP: 177/81 143/61 132/53 153/71  Pulse: 68 66 62 80  Temp: 98.3 F (36.8 C) 97.8 F (36.6 C) 97.3 F (36.3 C) 98.1 F (36.7 C)  TempSrc:  Oral Oral Oral  Resp: 16 18 18 18   Height:      Weight:      SpO2: 98% 98% 99% 98%   CBG (last 3)  No results found for this basename: GLUCAP,  in the last 72 hours  IV Fluid Intake:      MEDICATIONS  . aspirin  325 mg Oral Daily  . atorvastatin  10 mg Oral q1800  . enoxaparin (LOVENOX) injection  40 mg Subcutaneous Q24H  . levothyroxine  100 mcg Oral QAC breakfast  . lisinopril  10 mg Oral BID  . metoprolol succinate  25 mg Oral BID  . pantoprazole  40 mg Oral Daily   PRN:  acetaminophen, acetaminophen, ALPRAZolam, labetalol, meclizine, senna-docusate  Diet:  Cardiac 3 with thin liquids Activity:  Bedrest  DVT Prophylaxis:  SCD  CLINICALLY SIGNIFICANT STUDIES Basic Metabolic Panel:    Recent Labs Lab 07/11/13 1948 07/11/13 1958 07/14/13 0546  NA 141 143 142  K 4.0 4.0 3.7  CL 107 108 108  CO2 23  --  27  GLUCOSE 102* 100* 85  BUN 14 14 14   CREATININE 1.09 1.10 1.29*  CALCIUM 8.4  --  8.8   Liver Function Tests:   Recent Labs Lab 07/11/13 1948  AST 15  ALT 13  ALKPHOS 67  BILITOT 0.7  PROT 6.3  ALBUMIN 3.4*   CBC:   Recent Labs Lab 07/11/13 1948 07/11/13 1958 07/14/13 0546  WBC 5.1  --  6.1  NEUTROABS 2.3  --  3.7  HGB 10.7* 10.9* 10.3*  HCT 32.8* 32.0* 31.1*  MCV 91.4  --  91.2  PLT 142*  --  128*   Coagulation:   Recent Labs Lab 07/11/13 1948  LABPROT 12.7  INR 0.97   Cardiac Enzymes:   Recent Labs Lab 07/11/13 1948  TROPONINI <0.30   Urinalysis:   Recent Labs Lab 07/11/13 2031  COLORURINE YELLOW  LABSPEC 1.014  PHURINE 5.5  GLUCOSEU NEGATIVE  HGBUR NEGATIVE  BILIRUBINUR NEGATIVE  KETONESUR NEGATIVE  PROTEINUR NEGATIVE  UROBILINOGEN 1.0  NITRITE NEGATIVE  LEUKOCYTESUR NEGATIVE   Lipid Panel    Component Value Date/Time   CHOL 136 07/12/2013  0640   TRIG 71 07/12/2013 0640   HDL 55 07/12/2013 0640   CHOLHDL 2.5 07/12/2013 0640   VLDL 14 07/12/2013 0640   LDLCALC 67 07/12/2013 0640   HgbA1C  Lab Results  Component Value Date   HGBA1C 5.2 07/12/2013    Urine Drug Screen:     Component Value Date/Time   LABOPIA NONE DETECTED 07/11/2013 2031   COCAINSCRNUR NONE DETECTED 07/11/2013 2031   LABBENZ NONE DETECTED 07/11/2013 2031   AMPHETMU NONE DETECTED 07/11/2013 2031   THCU NONE DETECTED 07/11/2013 2031   LABBARB NONE DETECTED 07/11/2013 2031    Alcohol Level:   Recent Labs Lab 07/11/13 1948  ETH <11    Ct Head Wo Contrast 07/11/2013   : No acute abnormality. Minimal atrophy and mild chronic small vessel white matter ischemic changes.   MRI of the brain   IMPRESSION:  Acute right MCA lenticulostriate territory infarct. Slight central  hemorrhage.   MRA of the brain   IMPRESSION:  Intracranial  atherosclerotic changes as described. No significant  M1 stenosis on the right, but moderately severe right M2 disease is  identified; These lesions are distal to the observed pattern of  acute infarction.  2D Echocardiogram EF 60%, wall motion normal, No ASD or PFO identified.  Carotid Doppler  Bilateral: 1-39% ICA stenosis. Vertebral artery flow is antegrade  CXR    EKG  normal sinus rhythm.   Therapy Recommendations physical therapy evaluation is pending, rehabilitation consult has seen,possible recommendations for home health physical therapyical Exam  General: The patient is alert and cooperative at the time of the examination.  Respiratory: Lungs fields are clear.  Abdomen: Positive bowel sounds, nontender  Skin: No significant peripheral edema is noted.   Neurologic Exam  Cranial nerves: Facial symmetry is present. Speech is notable for a mild dysarthria. Visual fields are full.  Motor: The patient has good strength in all 4 extremities. Mild drift is noted with the left upper extremity.diminished fine finger movements on left and orbits right over left upper extremity.  Coordination: The patient has good finger-nose-finger and heel-to-shin bilaterally.  Gait and station: The gait was not tested.  Reflexes: Deep tendon reflexes are symmetric.     ASSESSMENT Jessica Myers is a 79 y.o. female presenting with left hemiparesis, dysarthria. Status post IV t-PA full dose on 07/11/2013 at 2022. Imaging does  show an acute right subcortical infarct with right MCA trifurcation stenosis/clot. Infarct felt to be embolic secondary to unknown source. Concern for embolic source due to second episode (first one transient, but recent)  On no antithrombotics prior to admission. Now on Aspirin 325mg  for secondary stroke prevention. The hemiparesis is much improved.    Hypertension  Hyperlipidemia, LDL 71 on lipitor, continue statin.  Left hemiparesis,  improved.  Dysarthria  Hypothyroidism  Long term medication use  HGB A1C  5.2  Hospital day # 4  TREATMENT/PLAN  Continue aspirin 325 mg orally every day for secondary stroke prevention.  Risk factor management  Concern for embolic source: TEE to look for embolic source. I have arranged for tomorrow. If positive for PFO (patent foramen ovale), check bilateral lower extremity venous dopplers to rule out DVT as possible source of stroke.  If TEE negative, [place] cardiologist will place implantable loop recorder to evaluate for atrial fibrillation as etiology of stroke. This has been explained to patient/family by Dr. Pearlean Brownie and they are agreeable.  Gwendolyn Lima. Manson Passey, [facility], MBA, MHA [facility] Pager: [phone] 07/15/2013  4:47 PM  I have personally obtained a history, examined the patient, evaluated imaging results, and formulated the assessment and plan of care. I agree with the above.  Delia Heady, MD

## 2013-07-15 NOTE — Progress Notes (Addendum)
Physical Therapy Treatment Patient Details Name: Jessica Myers MRN: [id number] DOB: December 25, 1928 Today's Date: 07/15/2013 Time: 4098-1191 PT Time Calculation (min): 41 min  PT Assessment / Plan / Recommendation  History of Present Illness pt presents with L sided weakness and slurred speech.  pt given tpa in ED with improvement of symptoms.     PT Comments   Daughter present throughout session and helpful with reinforcing pt's need for assist due to decr strength and balance. Discussed possibility of out-patient PT and pt is interested and will likely follow-up at [facility].    Follow Up Recommendations  Outpatient PT;Supervision/Assistance - 24 hour     Does the patient have the potential to tolerate intense rehabilitation     Barriers to Discharge        Equipment Recommendations  None recommended by PT    Recommendations for Other Services    Frequency Min 4X/week   Progress towards PT Goals Progress towards PT goals: Progressing toward goals  Plan Current plan remains appropriate    Precautions / Restrictions Precautions Precautions: Fall Precaution Comments: Lengthy discussion re: appropriate footwear as her current shoes (thick rubber sole with incr tread) caused trip with loss of balance x3 while ambulating (Lt foot drags/catches). Recommended thinner, smoother soled shoes which daughter reports pt has. Restrictions Weight Bearing Restrictions: No   Pertinent Vitals/Pain Denied pain     Mobility  Bed Mobility Bed Mobility: Not assessed Supine to Sit: 5: Supervision Sitting - Scoot to Edge of Bed: 5: Supervision Sit to Supine: 6: Modified independent (Device/Increase time) Scooting to [facility]: 5: Supervision Details for Bed Mobility Assistance: Cues to use rails to scoot HOB Transfers Transfers: Sit to Stand;Stand to Sit Sit to Stand: 4: Min guard;With upper extremity assist Stand to Sit: 4: Min assist;Without upper extremity assist Details for Transfer  Assistance: multiple transfers; pt twice with poor approach to surface for sitting (turning and sitting all at once with nearly missing the surface). Educated re: fully turning her back to the surface, feeling it with her legs, and then sitting to avoid risk of sliding off edge of surface/fall. Ambulation/Gait Ambulation/Gait Assistance: 4: Min assist Ambulation Distance (Feet): 300 Feet (200, seated rest, 100) Assistive device: None Ambulation/Gait Assistance Details: pt with no trunk rotation and minimal arm swing; Three times she "caught" her Lt toe/foot during swing phase with pt tripping and requiring assist to recover/maintain balance; educated re: focusing on Lt heelstrike and proper footwear; pt brushed up against Lt doorframe x 2 during gait; quick screen of peripheral vision indicated it was intact--likely due to mild decr awareness of Lt side Gait Pattern: Step-through pattern;Decreased stride length;Decreased dorsiflexion - left;Decreased trunk rotation Stairs: Yes Stairs Assistance: 4: Min assist;4: Min guard Stairs Assistance Details (indicate cue type and reason): initially 3 steps, pt with near catching Lt toe/foot on step as she advanced LLE; pt better able to clear Lt foot remainder of steps; no overt Loss of balance, however close guarding for safety Stair Management Technique: One rail Right;Alternating pattern;Step to pattern;Forwards Number of Stairs: 14 Modified Rankin (Stroke Patients Only) Pre-Morbid Rankin Score: No symptoms Modified Rankin: Moderately severe disability    Exercises General Exercises - Lower Extremity Ankle Circles/Pumps: AROM;Both;Seated   PT Diagnosis:    PT Problem List:   PT Treatment Interventions:     PT Goals (current goals can now be found in the care plan section) Acute Rehab PT Goals Patient Stated Goal: Get stronger and go home  Visit Information  Last PT Received On: 07/15/13 Assistance Needed: +1 History of Present Illness: pt  presents with L sided weakness and slurred speech.  pt given tpa in ED with improvement of symptoms.      Subjective Data  Subjective: Pt/daughter inquiring why she had this stroke Patient Stated Goal: Get stronger and go home   Cognition  Cognition Arousal/Alertness: Awake/alert Behavior During Therapy: WFL for tasks assessed/performed Overall Cognitive Status: Impaired/Different from baseline Area of Impairment: Safety/judgement;Awareness Safety/Judgement: Decreased awareness of safety;Decreased awareness of deficits Awareness: Intellectual Problem Solving: Difficulty sequencing;Requires verbal cues General Comments: despite instructions that she should not get up alone, she stood to greet her guests as PT was exiting room; reinforced not to get up alone    Balance  Static Standing Balance Static Standing - Balance Support: No upper extremity supported Static Standing - Level of Assistance: 5: Stand by assistance High Level Balance High Level Balance Activites: Head turns;Sudden stops;Turns High Level Balance Comments: drifts to her left with head turns  End of Session PT - End of Session Equipment Utilized During Treatment: Gait belt Activity Tolerance: Patient tolerated treatment well Patient left: in chair;with call bell/phone within reach;with family/visitor present   GP     Olga Bourbeau 07/15/2013, 12:47 PM Pager [phone]

## 2013-07-16 ENCOUNTER — Encounter (HOSPITAL_COMMUNITY): Payer: Self-pay | Admitting: *Deleted

## 2013-07-16 ENCOUNTER — Encounter (HOSPITAL_COMMUNITY)
Admission: EM | Disposition: A | Discharge status: Home / Self Care | Payer: Self-pay | Source: Home / Self Care | Attending: Neurology

## 2013-07-16 ENCOUNTER — Telehealth: Payer: Self-pay | Admitting: Neurology

## 2013-07-16 DIAGNOSIS — I63511 Cerebral infarction due to unspecified occlusion or stenosis of right middle cerebral artery: Secondary | ICD-10-CM | POA: Diagnosis present

## 2013-07-16 DIAGNOSIS — E785 Hyperlipidemia, unspecified: Secondary | ICD-10-CM | POA: Diagnosis present

## 2013-07-16 DIAGNOSIS — E039 Hypothyroidism, unspecified: Secondary | ICD-10-CM | POA: Diagnosis present

## 2013-07-16 DIAGNOSIS — Z79899 Other long term (current) drug therapy: Secondary | ICD-10-CM

## 2013-07-16 DIAGNOSIS — I639 Cerebral infarction, unspecified: Secondary | ICD-10-CM | POA: Diagnosis present

## 2013-07-16 DIAGNOSIS — E782 Mixed hyperlipidemia: Secondary | ICD-10-CM | POA: Diagnosis present

## 2013-07-16 DIAGNOSIS — I6350 Cerebral infarction due to unspecified occlusion or stenosis of unspecified cerebral artery: Secondary | ICD-10-CM

## 2013-07-16 DIAGNOSIS — G8194 Hemiplegia, unspecified affecting left nondominant side: Secondary | ICD-10-CM | POA: Diagnosis present

## 2013-07-16 DIAGNOSIS — I10 Essential (primary) hypertension: Secondary | ICD-10-CM | POA: Diagnosis present

## 2013-07-16 DIAGNOSIS — I69322 Dysarthria following cerebral infarction: Secondary | ICD-10-CM

## 2013-07-16 DIAGNOSIS — I059 Rheumatic mitral valve disease, unspecified: Secondary | ICD-10-CM

## 2013-07-16 HISTORY — PX: TEE WITHOUT CARDIOVERSION: SHX5443

## 2013-07-16 SURGERY — ECHOCARDIOGRAM, TRANSESOPHAGEAL
Anesthesia: Moderate Sedation

## 2013-07-16 SURGERY — LOOP RECORDER IMPLANT
Anesthesia: LOCAL

## 2013-07-16 MED ORDER — SODIUM CHLORIDE 0.9 % IV SOLN: INTRAVENOUS | Status: DC

## 2013-07-16 MED ORDER — PHENOL 1.4 % MT LIQD
1.0000 | OROMUCOSAL | Status: DC | PRN
  Administered 2013-07-16: 1 via OROMUCOSAL
  Filled 2013-07-16: qty 177

## 2013-07-16 MED ORDER — FENTANYL CITRATE 0.05 MG/ML IJ SOLN
INTRAMUSCULAR | Status: AC
  Filled 2013-07-16: qty 2

## 2013-07-16 MED ORDER — MIDAZOLAM HCL 10 MG/2ML IJ SOLN
INTRAMUSCULAR | Status: DC | PRN
  Administered 2013-07-16 (×3): 1 mg via INTRAVENOUS
  Administered 2013-07-16: 2 mg via INTRAVENOUS

## 2013-07-16 MED ORDER — MIDAZOLAM HCL 5 MG/ML IJ SOLN
INTRAMUSCULAR | Status: AC
  Filled 2013-07-16: qty 2

## 2013-07-16 MED ORDER — BUTAMBEN-TETRACAINE-BENZOCAINE 2-2-14 % EX AERO
INHALATION_SPRAY | CUTANEOUS | Status: DC | PRN
  Administered 2013-07-16: 2 via TOPICAL

## 2013-07-16 MED ORDER — FENTANYL CITRATE 0.05 MG/ML IJ SOLN
INTRAMUSCULAR | Status: DC | PRN
  Administered 2013-07-16: 25 ug via INTRAVENOUS
  Administered 2013-07-16: 12.5 ug via INTRAVENOUS

## 2013-07-16 MED ORDER — LIDOCAINE HCL (PF) 1 % IJ SOLN
INTRAMUSCULAR | Status: AC
  Filled 2013-07-16: qty 30

## 2013-07-16 MED ORDER — ASPIRIN 325 MG PO TABS: 325.0000 mg | ORAL_TABLET | Freq: Every day | ORAL | Status: DC

## 2013-07-16 NOTE — H&P (View-Only) (Signed)
ELECTROPHYSIOLOGY CONSULT NOTE  Patient ID: Jessica Myers MRN: [id number], DOB/AGE: 77/21/1930   Admit date: 07/11/2013 Date of Consult: 07/16/2013  Requesting Physician: Sethi Reason for Consultation: Cryptogenic stroke; recommendations regarding Implantable Loop Recorder  History of Present Illness Jessica Myers is a 79 year old female with a past medical history of hypertension, hyperlipidemia, and GERD.  She was in her usual state of health until9/25/2014 when she developed acute onset left hemiparesis.  EMS was called and she was brought to [facility] for further evaluation.  Imaging demonstrated acute right MCA lenticulostriate territory infarct.  Inpatient stroke work up including echocardiogram, carotids, and telemetry have been unrevealing.   Inpatient stroke work-up is to be completed with a TEE. EP has been asked to evaluate for placement of an implantable loop recorder to monitor for atrial fibrillation.  Past Medical History Past Medical History  Diagnosis Date  . Hypertension   . Vertical vertigo   . Thyroid disease   . High cholesterol   . Anxiety   . Headache(784.0)     Past Surgical History Past Surgical History  Procedure Laterality Date  . Cholecystectomy      Allergies/Intolerances Allergies  Allergen Reactions  . Penicillins     Hives   . Shellfish Allergy     hives   Inpatient Medications . aspirin  325 mg Oral Daily  . atorvastatin  10 mg Oral q1800  . enoxaparin (LOVENOX) injection  40 mg Subcutaneous Q24H  . levothyroxine  100 mcg Oral QAC breakfast  . lisinopril  10 mg Oral BID  . metoprolol succinate  25 mg Oral BID  . pantoprazole  40 mg Oral Daily     Social History History   Social History  . Marital Status: Married    Spouse Name: N/A    Number of Children: N/A  . Years of Education: N/A   Occupational History  . Not on file.   Social History Main Topics  . Smoking status: Never Smoker   . Smokeless tobacco: Never Used  .  Alcohol Use: No  . Drug Use: No  . Sexual Activity: Not Currently   Other Topics Concern  . Not on file   Social History Narrative  . No narrative on file    Review of Systems General: No chills, fever, night sweats or weight changes  Cardiovascular:  No chest pain, dyspnea on exertion, edema, orthopnea, palpitations, paroxysmal nocturnal dyspnea Dermatological: No rash, lesions or masses Respiratory: No cough, dyspnea Urologic: No hematuria, dysuria Abdominal: No nausea, vomiting, diarrhea, bright red blood per rectum, melena, or hematemesis Neurologic: No visual changes, weakness, changes in mental status All other systems reviewed and are otherwise negative except as noted above.  Physical Exam Blood pressure 160/74, pulse 61, temperature 97.7 F (36.5 C), temperature source Oral, resp. rate 16, height 5' 5" (1.651 m), weight 143 lb 4.8 oz (65 kg), SpO2 96.00%.  General: Well developed, well appearing 79 y.o. female in no acute distress. HEENT: Normocephalic, atraumatic. Sclera nonicteric. Oropharynx clear.  Neck: Supple  No JVD. Lungs: Respirations regular and unlabored, CTA bilaterally. No wheezes, rales or rhonchi. Heart: RRR.   Abdomen: Soft, non-tender, non-distended.   Extremities: No clubbing, cyanosis or edema. DP/PT/Radials 2+ and equal bilaterally. Psych: Normal affect. Musculoskeletal: No kyphosis. Skin: Intact. Warm and dry. No rashes or petechiae in exposed areas.   Labs Lab Results  Component Value Date   WBC 6.1 07/14/2013   HGB 10.3* 07/14/2013   HCT 31.1* 07/14/2013     MCV 91.2 07/14/2013   PLT 128* 07/14/2013    Recent Labs Lab 07/11/13 1948  07/14/13 0546  NA 141  < > 142  K 4.0  < > 3.7  CL 107  < > 108  CO2 23  --  27  BUN 14  < > 14  CREATININE 1.09  < > 1.29*  CALCIUM 8.4  --  8.8  PROT 6.3  --   --   BILITOT 0.7  --   --   ALKPHOS 67  --   --   ALT 13  --   --   AST 15  --   --   GLUCOSE 102*  < > 85  < > = values in this interval not  displayed. No results found for this basename: INR,  in the last 72 hours  Radiology/Studies Ct Head Wo Contrast 07/11/2013   CLINICAL DATA:  Slurred speech.  EXAM: CT HEAD WITHOUT CONTRAST  TECHNIQUE: Contiguous axial images were obtained from the base of the skull through the vertex without intravenous contrast.  COMPARISON:  MR dated 01/28/2013.  FINDINGS: Minimally enlarged ventricles and subarachnoid spaces. Mild patchy white matter low density in both cerebral hemispheres. This remains more focal in the left frontal lobe. No intracranial hemorrhage, mass lesion or CT evidence of acute infarction. Unremarkable bones and included paranasal sinuses.  IMPRESSION: No acute abnormality. Minimal atrophy and mild chronic small vessel white matter ischemic changes.  These results were called by telephone at the time of interpretation on 07/11/2013 at 8:10 PM to Dr. Dr. SCOTT ZACKOWSKI , who verbally acknowledged these results.   Electronically Signed   By: Steve  Reid   On: 07/11/2013 20:11   Mr Brain Wo Contrast 07/12/2013   *RADIOLOGY REPORT*  Clinical Data:  Acute onset of left hemiparesis and dysarthria. Stroke risk factors include hypertension, advanced age, and hyperlipidemia.  Also history of possible recent TIA.  MRI HEAD WITHOUT CONTRAST MRA HEAD WITHOUT CONTRAST  Technique:  Multiplanar, multiecho pulse sequences of the brain and surrounding structures were obtained without intravenous contrast. Angiographic images of the head were obtained using MRA technique without contrast.  Comparison:  CT head 07/11/2013.  MRI HEAD  Findings:  Acute right MCA territory infarction affects the insula, external capsule, putamen, globus pallidus, small portion of caudate, and periventricular white matter.  Small amount of acute hemorrhage, 7 mm diameter, is noted centrally on the MPGR sequence as an area of T2* effect (image 11 series 8).  Moderate atrophy.  Chronic microvascular ischemic change.  No areas of lacunar  infarction.  No large vessel infarct.  No midline shift. Flow voids are maintained.  Normal pituitary and cerebellar tonsils.  No osseous lesions.  Clear sinuses and mastoids. Bilateral cataract extraction.  Compared to prior CT, the area of infarction and hemorrhage was not visible.  IMPRESSION: Acute right MCA lenticulostriate territory infarct.  Slight central hemorrhage.  MRA HEAD  Findings: The internal carotid arteries are widely patent.  The basilar arteries widely patent.  Vertebrals codominant.  There is no proximal stenosis of the anterior, middle, or posterior cerebral arteries.  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 No MCA branch occlusion is present. There is no proximal M1 irregularity.  No cerebellar branch occlusion is seen.  There is no intracranial aneurysm.  IMPRESSION: Intracranial atherosclerotic changes as described.  No significant M1 stenosis on the right, but moderately severe right   M2 disease is identified; These  lesions are distal to the observed pattern of acute infarction.   Original Report Authenticated By: John Curnes, M.D.   Echocardiogram  07-14-2013 EF 60-65% no RWMA, LA 33  Telemetry sinus rhythm with no atrial arrhythmias.   Assessment and Plan 1. Cryptogenic stroke The patient has had a stroke of unclear etiology.  If the TEE is negative, we recommend loop recorder insertion to monitor for AF. The indication for loop recorder insertion / monitoring for AF in setting of cryptogenic stroke was discussed with the patient. The loop recorder insertion procedure was reviewed in detail including risks and benefits. These risks include but are not limited to bleeding and infection. The patient expressed verbal understanding and agrees to proceed. The patient was also counseled regarding wound care and device follow-up.  Signed, Nicandro Perrault  MD  07/16/2013, 8:01 AM     

## 2013-07-16 NOTE — Consult Note (Signed)
ELECTROPHYSIOLOGY CONSULT NOTE  Patient ID: Jessica Myers MRN: [id number], DOB/AGE: [birth year]   Admit date: 07/11/2013 Date of Consult: 07/16/2013  Requesting Physician: Pearlean Brownie Reason for Consultation: Cryptogenic stroke; recommendations regarding Implantable Loop Recorder  History of Present Illness Jessica Myers is a 79 year old female with a past medical history of hypertension, hyperlipidemia, and GERD.  She was in her usual state of health until9/25/2014 when she developed acute onset left hemiparesis.  EMS was called and she was brought to [facility] for further evaluation.  Imaging demonstrated acute right MCA lenticulostriate territory infarct.  Inpatient stroke work up including echocardiogram, carotids, and telemetry have been unrevealing.   Inpatient stroke work-up is to be completed with a TEE. EP has been asked to evaluate for placement of an implantable loop recorder to monitor for atrial fibrillation.  Past Medical History Past Medical History  Diagnosis Date  . Hypertension   . Vertical vertigo   . Thyroid disease   . High cholesterol   . Anxiety   . NWGNFAOZ(308.6)     Past Surgical History Past Surgical History  Procedure Laterality Date  . Cholecystectomy      Allergies/Intolerances Allergies  Allergen Reactions  . Penicillins     Hives   . Shellfish Allergy     hives   Inpatient Medications . aspirin  325 mg Oral Daily  . atorvastatin  10 mg Oral q1800  . enoxaparin (LOVENOX) injection  40 mg Subcutaneous Q24H  . levothyroxine  100 mcg Oral QAC breakfast  . lisinopril  10 mg Oral BID  . metoprolol succinate  25 mg Oral BID  . pantoprazole  40 mg Oral Daily     Social History History   Social History  . Marital Status: Married    Spouse Name: N/A    Number of Children: N/A  . Years of Education: N/A   Occupational History  . Not on file.   Social History Main Topics  . Smoking status: Never Smoker   . Smokeless tobacco: Never Used  .  Alcohol Use: No  . Drug Use: No  . Sexual Activity: Not Currently   Other Topics Concern  . Not on file   Social History Narrative  . No narrative on file    Review of Systems General: No chills, fever, night sweats or weight changes  Cardiovascular:  No chest pain, dyspnea on exertion, edema, orthopnea, palpitations, paroxysmal nocturnal dyspnea Dermatological: No rash, lesions or masses Respiratory: No cough, dyspnea Urologic: No hematuria, dysuria Abdominal: No nausea, vomiting, diarrhea, bright red blood per rectum, melena, or hematemesis Neurologic: No visual changes, weakness, changes in mental status All other systems reviewed and are otherwise negative except as noted above.  Physical Exam Blood pressure 160/74, pulse 61, temperature 97.7 F (36.5 C), temperature source Oral, resp. rate 16, height 5\' 5"  (1.651 m), weight 143 lb 4.8 oz (65 kg), SpO2 96.00%.  General: Well developed, well appearing 79 y.o. female in no acute distress. HEENT: Normocephalic, atraumatic. Sclera nonicteric. Oropharynx clear.  Neck: Supple  No JVD. Lungs: Respirations regular and unlabored, CTA bilaterally. No wheezes, rales or rhonchi. Heart: RRR.   Abdomen: Soft, non-tender, non-distended.   Extremities: No clubbing, cyanosis or edema. DP/PT/Radials 2+ and equal bilaterally. Psych: Normal affect. Musculoskeletal: No kyphosis. Skin: Intact. Warm and dry. No rashes or petechiae in exposed areas.   Labs Lab Results  Component Value Date   WBC 6.1 07/14/2013   HGB 10.3* 07/14/2013   HCT 31.1* 07/14/2013  MCV 91.2 07/14/2013   PLT 128* 07/14/2013    Recent Labs Lab 07/11/13 1948  07/14/13 0546  NA 141  < > 142  K 4.0  < > 3.7  CL 107  < > 108  CO2 23  --  27  BUN 14  < > 14  CREATININE 1.09  < > 1.29*  CALCIUM 8.4  --  8.8  PROT 6.3  --   --   BILITOT 0.7  --   --   ALKPHOS 67  --   --   ALT 13  --   --   AST 15  --   --   GLUCOSE 102*  < > 85  < > = values in this interval not  displayed. No results found for this basename: INR,  in the last 72 hours  Radiology/Studies Ct Head Wo Contrast 07/11/2013   CLINICAL DATA:  Slurred speech.  EXAM: CT HEAD WITHOUT CONTRAST  TECHNIQUE: Contiguous axial images were obtained from the base of the skull through the vertex without intravenous contrast.  COMPARISON:  MR dated 01/28/2013.  FINDINGS: Minimally enlarged ventricles and subarachnoid spaces. Mild patchy white matter low density in both cerebral hemispheres. This remains more focal in the left frontal lobe. No intracranial hemorrhage, mass lesion or CT evidence of acute infarction. Unremarkable bones and included paranasal sinuses.  IMPRESSION: No acute abnormality. Minimal atrophy and mild chronic small vessel white matter ischemic changes.  These results were called by telephone at the time of interpretation on 07/11/2013 at 8:10 PM to Dr. Dr. Vanetta Mulders , who verbally acknowledged these results.   Electronically Signed   By: Gordan Payment   On: 07/11/2013 20:11   Mr Brain Wo Contrast 07/12/2013   *RADIOLOGY REPORT*  Clinical Data:  Acute onset of left hemiparesis and dysarthria. Stroke risk factors include hypertension, advanced age, and hyperlipidemia.  Also history of possible recent TIA.  MRI HEAD WITHOUT CONTRAST MRA HEAD WITHOUT CONTRAST  Technique:  Multiplanar, multiecho pulse sequences of the brain and surrounding structures were obtained without intravenous contrast. Angiographic images of the head were obtained using MRA technique without contrast.  Comparison:  CT head 07/11/2013.  MRI HEAD  Findings:  Acute right MCA territory infarction affects the insula, external capsule, putamen, globus pallidus, small portion of caudate, and periventricular white matter.  Small amount of acute hemorrhage, 7 mm diameter, is noted centrally on the MPGR sequence as an area of T2* effect (image 11 series 8).  Moderate atrophy.  Chronic microvascular ischemic change.  No areas of lacunar  infarction.  No large vessel infarct.  No midline shift. Flow voids are maintained.  Normal pituitary and cerebellar tonsils.  No osseous lesions.  Clear sinuses and mastoids. Bilateral cataract extraction.  Compared to prior CT, the area of infarction and hemorrhage was not visible.  IMPRESSION: Acute right MCA lenticulostriate territory infarct.  Slight central hemorrhage.  MRA HEAD  Findings: The internal carotid arteries are widely patent.  The basilar arteries widely patent.  Vertebrals codominant.  There is no proximal stenosis of the anterior, middle, or posterior cerebral arteries.  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 No MCA branch occlusion is present. There is no proximal M1 irregularity.  No cerebellar branch occlusion is seen.  There is no intracranial aneurysm.  IMPRESSION: Intracranial atherosclerotic changes as described.  No significant M1 stenosis on the right, but moderately severe right  M2 disease is identified; These  lesions are distal to the observed pattern of acute infarction.   Original Report Authenticated By: Davonna Belling, M.D.   Echocardiogram  07-14-2013 EF 60-65% no RWMA, LA 33  Telemetry sinus rhythm with no atrial arrhythmias.   Assessment and Plan 1. Cryptogenic stroke The patient has had a stroke of unclear etiology.  If the TEE is negative, we recommend loop recorder insertion to monitor for AF. The indication for loop recorder insertion / monitoring for AF in setting of cryptogenic stroke was discussed with the patient. The loop recorder insertion procedure was reviewed in detail including risks and benefits. These risks include but are not limited to bleeding and infection. The patient expressed verbal understanding and agrees to proceed. The patient was also counseled regarding wound care and device follow-up.  Randolm Idol  MD  07/16/2013, 8:01 AM

## 2013-07-16 NOTE — Progress Notes (Addendum)
Occupational Therapy Treatment Patient Details Name: Jessica Myers MRN: [id number] DOB: 14-Jun-1929 Today's Date: 07/16/2013 Time: 0272-5366 OT Time Calculation (min): 30 min  OT Assessment / Plan / Recommendation  History of present illness pt presents with L sided weakness and slurred speech.  pt given tpa in ED with improvement of symptoms.     OT comments  Pt practiced tub transfer again today. She is really wanting to be able to sit down in the tub. OT educated pt on safety and recommended sitting on chair for bathing. Also, practiced simulated regular height toilet transfer and performed grooming at sink. Daughter also present.   Follow Up Recommendations  Supervision/Assistance - 24 hour;No OT follow up    Barriers to Discharge       Equipment Recommendations  Tub/shower seat    Recommendations for Other Services    Frequency Min 2X/week   Progress towards OT Goals Progress towards OT goals: Progressing toward goals  Plan Discharge plan remains appropriate    Precautions / Restrictions Precautions Precautions: Fall Restrictions Weight Bearing Restrictions: No   Pertinent Vitals/Pain No pain reported.     ADL  Grooming: Teeth care;Supervision/safety (grooming supplies already in bathroom) Where Assessed - Grooming: Supported standing Toilet Transfer: Supervision/safety Statistician Method: Sit to Barista: Regular height toilet Tub/Shower Transfer: Performed;Minimal assistance Web designer Method: Science writer: Shower seat with back Equipment Used: Gait belt Transfers/Ambulation Related to ADLs: Min guard  ADL Comments: Items set on left side of sink and pt able to locate-PT reported that she noted some inattention to left side yesterday. Pt practiced regular height toilet transfer. Pt ambulated to gym to practice tub transfer again and pt really wanting to practice again getting all the way down in the  tub. Pt did do better with task today, but OT still recommends and stressed sitting on chair to bathe for safety. OT demonstrated two ways to use flat shower chair, which pt is open to getting one. Daughter also present for session.     OT Diagnosis:    OT Problem List:   OT Treatment Interventions:     OT Goals(current goals can now be found in the care plan section) Acute Rehab OT Goals Patient Stated Goal: Go home OT Goal Formulation: With patient Time For Goal Achievement: 07/22/13 Potential to Achieve Goals: Good ADL Goals Pt Will Perform Lower Body Bathing: with modified independence;sit to/from stand Pt Will Perform Lower Body Dressing: with modified independence;sit to/from stand Pt Will Transfer to Toilet: with modified independence;ambulating;regular height toilet Pt Will Perform Toileting - Clothing Manipulation and hygiene: with modified independence;sit to/from stand Pt Will Perform Tub/Shower Transfer: Tub transfer;with supervision;ambulating;shower seat  Visit Information  Last OT Received On: 07/16/13 Assistance Needed: +1 History of Present Illness: pt presents with L sided weakness and slurred speech.  pt given tpa in ED with improvement of symptoms.      Subjective Data      Prior Functioning       Cognition  Cognition Arousal/Alertness: Awake/alert Behavior During Therapy: WFL for tasks assessed/performed Overall Cognitive Status: Impaired/Different from baseline Area of Impairment: Safety/judgement;Awareness Safety/Judgement: Decreased awareness of safety;Decreased awareness of deficits Awareness: Intellectual General Comments: despite needing +2 to get out of tub yesterday with OT, she reports she thinks it will be fine for her to get into her tub at home. "it's different" she states.  Pt will better awareness of Lt side today and did not run into objects on her  Lt    Mobility  Bed Mobility Bed Mobility: Not assessed Transfers Transfers: Sit to  Stand;Stand to Sit Sit to Stand: 5: Supervision;From chair/3-in-1;From toilet;4: Min guard Stand to Sit: 5: Supervision;To chair/3-in-1;To toilet;4: Min guard Details for Transfer Assistance: Min guard for sit <> stand to shower chair, otherwise Supervision level          End of Session OT - End of Session Equipment Utilized During Treatment: Gait belt Activity Tolerance: Patient tolerated treatment well Patient left: in chair;with call bell/phone within reach;with family/visitor present  GO     Earlie Raveling OTR/L [phone] 07/16/2013, 12:45 PM

## 2013-07-16 NOTE — Progress Notes (Signed)
Patient told Darren with [facility] that she does not want any equipment/ tub bench; B Ayen Viviano RN,BSN,MHA

## 2013-07-16 NOTE — Progress Notes (Signed)
Echocardiogram Echocardiogram Transesophageal has been performed.  Jessica Myers 07/16/2013, 2:23 PM

## 2013-07-16 NOTE — Progress Notes (Signed)
Physical Therapy Treatment Patient Details Name: Jessica Myers MRN: [id number] DOB: 1929-01-13 Today's Date: 07/16/2013 Time: 1093-2355 PT Time Calculation (min): 35 min  PT Assessment / Plan / Recommendation  History of Present Illness pt presents with L sided weakness and slurred speech.  pt given tpa in ED with improvement of symptoms.     PT Comments   Pt improved from 9/29, however Lt leg continues to fatigue with linger distance gait and then lightly drags the LLE. Pt remains a high fall risk with DGI (Dynamic Gait Index) = 16 (<19 indicates fall risk). Pt agreeable to cointinued OPPT so she can get back to her 1 1/2 hour dance classes.   Follow Up Recommendations  Outpatient PT;Supervision/Assistance - 24 hour     Does the patient have the potential to tolerate intense rehabilitation     Barriers to Discharge        Equipment Recommendations  None recommended by PT    Recommendations for Other Services    Frequency Min 4X/week   Progress towards PT Goals Progress towards PT goals: Progressing toward goals  Plan Current plan remains appropriate    Precautions / Restrictions Precautions Precautions: Fall Precaution Comments: Educated on need to have someone walking with her at all times when she goes home (as she adjusts to the carpet/different flooring at home) Restrictions Weight Bearing Restrictions: No   Pertinent Vitals/Pain Denies pain    Mobility  Bed Mobility Bed Mobility: Not assessed Transfers Transfers: Sit to Stand;Stand to Sit Sit to Stand: With upper extremity assist;5: Supervision;Without upper extremity assist Stand to Sit: Without upper extremity assist;5: Supervision Details for Transfer Assistance: multiple transfers; pt demostrated much better control and safety awareness Ambulation/Gait Ambulation/Gait Assistance: 4: Min guard Ambulation Distance (Feet): 400 Feet Assistive device: None Ambulation/Gait Assistance Details: Ambulated in  slipper socks with no near tripping today. As she walked >300 ft, her Lt foot did begin to drag slightly. She was able to correct with cues to focus on heelstrike.  Gait Pattern: Step-through pattern;Decreased stride length;Decreased dorsiflexion - left;Decreased trunk rotation Gait velocity: generally decr however able to vary speed up and down from her baseline Stairs: No Modified Rankin (Stroke Patients Only) Pre-Morbid Rankin Score: No symptoms Modified Rankin: Moderately severe disability    Exercises General Exercises - Lower Extremity Ankle Circles/Pumps: AROM;Both;Seated;15 reps Toe Raises: AAROM;Both;10 reps;Standing Heel Raises: AROM;Both;10 reps;Standing Mini-Sqauts: AROM;Both;10 reps;Standing   PT Diagnosis:    PT Problem List:   PT Treatment Interventions:     PT Goals (current goals can now be found in the care plan section) Acute Rehab PT Goals Patient Stated Goal: Get stronger and go home  Visit Information  Last PT Received On: 07/16/13 Assistance Needed: +1 History of Present Illness: pt presents with L sided weakness and slurred speech.  pt given tpa in ED with improvement of symptoms.      Subjective Data  Subjective: Daughter reports she has been looking for flat shoes for pt to wear Patient Stated Goal: Get stronger and go home   Cognition  Cognition Arousal/Alertness: Awake/alert Behavior During Therapy: WFL for tasks assessed/performed Overall Cognitive Status: Impaired/Different from baseline Area of Impairment: Safety/judgement;Awareness Safety/Judgement: Decreased awareness of safety;Decreased awareness of deficits Awareness: Intellectual General Comments: despite needing +2 to get out of tub yesterday with OT, she reports she thinks it will be fine for her to get into her tub at home. "it's different" she states.  Pt will better awareness of Lt side today and did  not run into objects on her Teacher, adult education Standing -  Balance Support: No upper extremity supported Static Standing - Level of Assistance: 5: Stand by assistance Standardized Balance Assessment Standardized Balance Assessment: Dynamic Gait Index Dynamic Gait Index Level Surface: Mild Impairment Change in Gait Speed: Mild Impairment Gait with Horizontal Head Turns: Mild Impairment Gait with Vertical Head Turns: Mild Impairment Gait and Pivot Turn: Normal Step Over Obstacle: Mild Impairment Step Around Obstacles: Mild Impairment Steps: Moderate Impairment Total Score: 16 High Level Balance High Level Balance Activites: Head turns;Sudden stops;Turns High Level Balance Comments: no drifting to her Lt noted   End of Session PT - End of Session Equipment Utilized During Treatment: Gait belt Activity Tolerance: Patient tolerated treatment well Patient left: in chair;with call bell/phone within reach;with family/visitor present   GP     Jessica Myers 07/16/2013, 10:10 AM Pager [phone]

## 2013-07-16 NOTE — Progress Notes (Signed)
D/c instruction  And follow up appointmentt scheduling given to pt  Reminded to keep incision to left chest clean dry and to keep it dry for5 days to call labour cardiology with c/o  Condition at discharge is stable

## 2013-07-16 NOTE — Procedures (Signed)
SURGEON:  Hillis Range, MD     PREPROCEDURE DIAGNOSIS:  Cryptogenic Stroke    POSTPROCEDURE DIAGNOSIS:  Cryptogenic Stroke     PROCEDURES:   1. Implantable loop recorder implantation    INTRODUCTION:  Jessica Myers is a 79 y.o. female with a history of unexplained stroke who presents today for implantable loop implantation.  The patient has had a cryptogenic stroke.  Despite an extensive workup by neurology, no reversible causes have been identified.  she has worn telemetry during which she did not have arrhythmias.  There is significant concern for possible atrial fibrillation as the cause for the patients stroke.  The patient therefore presents today for implantable loop implantation.     DESCRIPTION OF PROCEDURE:  Informed written consent was obtained, and the patient was brought to the electrophysiology lab in a fasting state.  The patient required no sedation for the procedure today.  Mapping over the patient's chest was performed by the EP lab staff to identify the area where electrograms were most prominent for ILR recording.  This area was found to be the left parasternal region over the 3rd-4th intercostal space. The patients left chest was therefore prepped and draped in the usual sterile fashion by the EP lab staff. The skin overlying the left parasternal region was infiltrated with lidocaine for local analgesia.  A 0.5-cm incision was made over the left parasternal region over the 3rd intercostal space.  A subcutaneous ILR pocket was fashioned using a combination of sharp and blunt dissection.  A Medtronic Reveal Henderson model [id number] implantable loop recorder was then placed into the pocket  R waves were very prominent and measured 0.7mV.  Steri- Strips and a sterile dressing were then applied.  There were no early apparent complications.     CONCLUSIONS:   1. Successful implantation of a Medtronic Reveal LINQ implantable loop recorder for cryptogenic stroke  2. No early  apparent complications.

## 2013-07-16 NOTE — Progress Notes (Signed)
Pt kept NPO since Midnight for possible TEE today as per the MD note.

## 2013-07-16 NOTE — Discharge Summary (Signed)
Stroke Discharge Summary  Patient ID: Jessica Myers   MRN: [id number]      DOB: [birth year]  Date of Admission: 07/11/2013 Date of Discharge: 07/16/2013  Attending Physician:  Darcella Cheshire, MD, Stroke MD  Consulting Physician(s):   Treatment Team:  Md Stroke, MD cardiology  Patient's PCP:  No primary provider on file.  Discharge Diagnoses:  Principal Problem:   Acute right MCA stroke of embolic origin with Rt MCA trifurcation clot with no definite identified source of embolism Active Problems:   Stroke   Acute left hemiparesis   Accelerated hypertension   Dyslipidemia, goal LDL below 100   Dysarthria as late effect of stroke   Unspecified hypothyroidism   Encounter for long-term (current) use of medications  BMI: Body mass index is 23.85 kg/(m^2).  Past Medical History  Diagnosis Date  . Hypertension   . Vertical vertigo   . Thyroid disease   . High cholesterol   . Anxiety   . BMWUXLKG(401.0)    Past Surgical History  Procedure Laterality Date  . Cholecystectomy        Medication List         ALPRAZolam 0.5 MG tablet  Commonly known as:  XANAX  Take 0.5 mg by mouth 3 (three) times daily.     aspirin 325 MG tablet  Take 1 tablet (325 mg total) by mouth daily.     atorvastatin 10 MG tablet  Commonly known as:  LIPITOR  Take 10 mg by mouth daily.     levothyroxine 100 MCG tablet  Commonly known as:  SYNTHROID, LEVOTHROID  Take 100 mcg by mouth daily.     lisinopril 10 MG tablet  Commonly known as:  PRINIVIL,ZESTRIL  Take 10 mg by mouth 2 (two) times daily.     meclizine 25 MG tablet  Commonly known as:  ANTIVERT  Take 25 mg by mouth 3 (three) times daily as needed for dizziness.     metoprolol succinate 25 MG 24 hr tablet  Commonly known as:  TOPROL-XL  Take 25 mg by mouth 2 (two) times daily.     omeprazole 20 MG capsule  Commonly known as:  PRILOSEC  Take 20 mg by mouth daily.        LABORATORY STUDIES CBC    Component Value  Date/Time   WBC 6.1 07/14/2013 0546   RBC 3.41* 07/14/2013 0546   HGB 10.3* 07/14/2013 0546   HCT 31.1* 07/14/2013 0546   PLT 128* 07/14/2013 0546   MCV 91.2 07/14/2013 0546   MCH 30.2 07/14/2013 0546   MCHC 33.1 07/14/2013 0546   RDW 12.9 07/14/2013 0546   LYMPHSABS 1.5 07/14/2013 0546   MONOABS 0.7 07/14/2013 0546   EOSABS 0.2 07/14/2013 0546   BASOSABS 0.0 07/14/2013 0546   CMP    Component Value Date/Time   NA 142 07/14/2013 0546   K 3.7 07/14/2013 0546   CL 108 07/14/2013 0546   CO2 27 07/14/2013 0546   GLUCOSE 85 07/14/2013 0546   BUN 14 07/14/2013 0546   CREATININE 1.29* 07/14/2013 0546   CALCIUM 8.8 07/14/2013 0546   PROT 6.3 07/11/2013 1948   ALBUMIN 3.4* 07/11/2013 1948   AST 15 07/11/2013 1948   ALT 13 07/11/2013 1948   ALKPHOS 67 07/11/2013 1948   BILITOT 0.7 07/11/2013 1948   GFRNONAA 37* 07/14/2013 0546   GFRAA 43* 07/14/2013 0546   COAGS Lab Results  Component Value Date   INR 0.97 07/11/2013  Lipid Panel    Component Value Date/Time   CHOL 136 07/12/2013 0640   TRIG 71 07/12/2013 0640   HDL 55 07/12/2013 0640   CHOLHDL 2.5 07/12/2013 0640   VLDL 14 07/12/2013 0640   LDLCALC 67 07/12/2013 0640   HgbA1C  Lab Results  Component Value Date   HGBA1C 5.2 07/12/2013   Cardiac Panel (last 3 results) No results found for this basename: CKTOTAL, CKMB, TROPONINI, RELINDX,  in the last 72 hours Urinalysis    Component Value Date/Time   COLORURINE YELLOW 07/11/2013 2031   APPEARANCEUR CLEAR 07/11/2013 2031   LABSPEC 1.014 07/11/2013 2031   PHURINE 5.5 07/11/2013 2031   GLUCOSEU NEGATIVE 07/11/2013 2031   HGBUR NEGATIVE 07/11/2013 2031   BILIRUBINUR NEGATIVE 07/11/2013 2031   KETONESUR NEGATIVE 07/11/2013 2031   PROTEINUR NEGATIVE 07/11/2013 2031   UROBILINOGEN 1.0 07/11/2013 2031   NITRITE NEGATIVE 07/11/2013 2031   LEUKOCYTESUR NEGATIVE 07/11/2013 2031   Urine Drug Screen     Component Value Date/Time   LABOPIA NONE DETECTED 07/11/2013 2031   COCAINSCRNUR NONE DETECTED 07/11/2013 2031    LABBENZ NONE DETECTED 07/11/2013 2031   AMPHETMU NONE DETECTED 07/11/2013 2031   THCU NONE DETECTED 07/11/2013 2031   LABBARB NONE DETECTED 07/11/2013 2031    Alcohol Level    Component Value Date/Time   ETH <11 07/11/2013 1948     SIGNIFICANT DIAGNOSTIC STUDIES Ct Head Wo Contrast  07/11/2013 : No acute abnormality. Minimal atrophy and mild chronic small vessel white matter ischemic changes.  MRI of the brain  IMPRESSION:  Acute right MCA lenticulostriate territory infarct. Slight central  hemorrhage.  MRA of the brain  IMPRESSION:  Intracranial atherosclerotic changes as described. No significant  M1 stenosis on the right, but moderately severe right M2 disease is  identified; These lesions are distal to the observed pattern of  acute infarction.   TEE: Left Ventrical: Normal LV  Mitral Valve: mild -mod MR  Aortic Valve: mild - mod AI  Tricuspid Valve: normal  Pulmonic Valve: normal  Left Atrium/ Left atrial appendage: no thrombus  Atrial septum: intact, no asd or PFO  Aorta: mild calcification  2D Echocardiogram EF 60%, wall motion normal, No ASD or PFO identified.  Carotid Doppler Bilateral: 1-39% ICA stenosis. Vertebral artery flow is antegrade  EKG normal sinus rhythm.     History of Present Illness   Jessica Myers is an 79 y.o. female, right handed, with a past medical history significant for HTN, hyperlipidemia, GERD, brought to [facility] ED by medics as a code stroke due to acute onset left hemiparesis, left face weakness, and dysarthria.  She was last known well at 1850 pm today, doing some work at her computer, when her husband noticed that she was having slurred speech, weakness of the left face, and inability to use the left side of her body. Ambulance called and upon initial evaluation in the ED she had NIHSS 16.  CT brain revealed no acute intracranial abnormality.  Of importance, family report that couple of weeks ago she had transient slurred speech and had a  brain MRI that was normal  Date last known well: 07/11/13  Time last known well: 1850 pm  tPA Given: yes  NIHSS: 16  MRS: 3   Hospital Course  Ms. Jessica Myers is a 79 y.o. female presenting with left hemiparesis, dysarthria. Status post IV t-PA full dose on 07/11/2013 at 2022. Imaging does show an acute right subcortical infarct with right MCA trifurcation  stenosis/clot. Infarct felt to be embolic secondary to unknown source. Concern for embolic source due to second episode (first one transient, but recent) On no antithrombotics prior to admission. Now on Aspirin 325mg  for secondary stroke prevention. The hemiparesis is much improved.  Hypertension  Dyslipidemia, LDL 71 on lipitor, continue statin.  Left hemiparesis, improved.  Dysarthria  Hypothyroidism  Long term medication use  HGB A1C 5.2 Hospital day # 4  TREATMENT/PLAN  Continue aspirin 325 mg orally every day for secondary stroke prevention.  Risk factor management  TEE negative for embolic source, therefore LOOP placed to evaluate for atrial fibrillation.   Patient with vascular risk factors of:   Hypertension  hyperlipidemia  Patient with continued stroke symptoms of left hemiparesis. Physical therapy, occupational therapy and speech therapy evaluated patient. They recommend outpatient therapy.  Discharge Exam  Blood pressure 179/62, pulse 65, temperature 97.5 F (36.4 C), temperature source Oral, resp. rate 20, height 5\' 5"  (1.651 m), weight 65 kg (143 lb 4.8 oz), SpO2 97.00%.   General: The patient is alert and cooperative at the time of the examination.  Respiratory: Lungs fields are clear.  Abdomen: Positive bowel sounds, nontender  Skin: No significant peripheral edema is noted.  Neurologic Exam  Cranial nerves: Facial symmetry is present. Speech is notable for a mild dysarthria. Visual fields are full.  Motor: The patient has good strength in all 4 extremities. Mild drift is noted with the left upper  extremity.diminished fine finger movements on left and orbits right over left upper extremity.  Coordination: The patient has good finger-nose-finger and heel-to-shin bilaterally.  Gait and station: The gait was not tested.  Reflexes: Deep tendon reflexes are symmetric.   Discharge Diet   Cardiac thin liquids  Discharge Plan    Disposition:  home   aspirin 325 mg orally every day for secondary stroke prevention.  Ongoing risk factor control by Primary Care Physician. Risk factor recommendations:  Hypertension target range 130-140/70-80 Lipid range - LDL < 100 and checked every 6 months, fasting Diabetes - HgB A1C <7   Follow-up with primary physician in 1 month.  Follow-up with Dr. Delia Heady, Stroke Clinic in 2 months.  45 minutes were spent preparing discharge.  Signed  Gwendolyn Lima. Manson Passey, [facility], MBA, MHA [facility] Pager: [phone] 07/16/2013 3:52 PM  I have personally examined this patient, reviewed pertinent data and developed the plan of care. I agree with above. Delia Heady, MD

## 2013-07-16 NOTE — CV Procedure (Signed)
    Transesophageal Echocardiogram Note  Jessica Myers [id number] [birth year]  Procedure: Transesophageal Echocardiogram Indications: CVA  Procedure Details Consent: Obtained Time Out: Verified patient identification, verified procedure, site/side was marked, verified correct patient position, special equipment/implants available, Radiology Safety Procedures followed,  medications/allergies/relevent history reviewed, required imaging and test results available.  Performed  Medications: Fentanyl: 37.5 mcg Versed: 5 mg iv  Left Ventrical:  Normal LV   Mitral Valve: mild -mod MR  Aortic Valve: mild - mod AI  Tricuspid Valve: normal  Pulmonic Valve: normal  Left Atrium/ Left atrial appendage: no thrombus  Atrial septum: intact, no asd or PFO  Aorta: mild calcification    Complications: No apparent complications Patient did tolerate procedure well.   Jessica Myers, Montez Hageman., MD, [facility] 07/16/2013, 12:40 PM

## 2013-07-16 NOTE — Progress Notes (Signed)
Talked to patient about outpatient physical therapy choices, patient chose [facility] for physical therapy after discharge; Orders and clinical information faxed to [facility] as requested; B Shelba Flake

## 2013-07-16 NOTE — Interval H&P Note (Signed)
History and Physical Interval Note:  07/16/2013 8:03 AM  Jessica Myers  has presented today for surgery, with the diagnosis of stroke  The various methods of treatment have been discussed with the patient and family. After consideration of risks, benefits and other options for treatment, the patient has consented to  Procedure(s): LOOP RECORDER IMPLANT (N/A) as a surgical intervention .  The patient's history has been reviewed, patient examined, no change in status, stable for surgery.  I have reviewed the patient's chart and labs.  Questions were answered to the patient's satisfaction.     Hillis Range

## 2013-07-16 NOTE — Interval H&P Note (Signed)
History and Physical Interval Note:  07/16/2013 11:58 AM  Jessica Myers  has presented today for surgery, with the diagnosis of stroke  The various methods of treatment have been discussed with the patient and family. After consideration of risks, benefits and other options for treatment, the patient has consented to  Procedure(s): TRANSESOPHAGEAL ECHOCARDIOGRAM (TEE) (N/A) as a surgical intervention .  The patient's history has been reviewed, patient examined, no change in status, stable for surgery.  I have reviewed the patient's chart and labs.  Questions were answered to the patient's satisfaction.     Elyn Aquas.

## 2013-07-16 NOTE — Progress Notes (Signed)
AVS and d/c and follow up ininstruction given  To pt and pt verbalized good understanding. Incision to back intact clean  No s/s of infection noted. Pt voiding well with no residual post void bladder scan. Condition is stable

## 2013-07-16 NOTE — Progress Notes (Signed)
Pt send  Down for TEE and possible cardioversion CMT notified . Ptremain NPO

## 2013-07-17 ENCOUNTER — Encounter (HOSPITAL_COMMUNITY): Payer: Self-pay | Admitting: Cardiovascular Disease

## 2013-07-18 NOTE — ED Provider Notes (Signed)
I saw and evaluated the patient, reviewed the resident's note and I agree with the findings and plan.  Results for orders placed during the hospital encounter of 07/11/13  MRSA PCR SCREENING      Result Value Range   MRSA by PCR NEGATIVE  NEGATIVE  ETHANOL      Result Value Range   Alcohol, Ethyl (B) <11  0 - 11 mg/dL  PROTIME-INR      Result Value Range   Prothrombin Time 12.7  11.6 - 15.2 seconds   INR 0.97  0.00 - 1.49  APTT      Result Value Range   aPTT 42 (*) 24 - 37 seconds  CBC      Result Value Range   WBC 5.1  4.0 - 10.5 K/uL   RBC 3.59 (*) 3.87 - 5.11 MIL/uL   Hemoglobin 10.7 (*) 12.0 - 15.0 g/dL   HCT 45.4 (*) 09.8 - 11.9 %   MCV 91.4  78.0 - 100.0 fL   MCH 29.8  26.0 - 34.0 pg   MCHC 32.6  30.0 - 36.0 g/dL   RDW 14.7  82.9 - 56.2 %   Platelets 142 (*) 150 - 400 K/uL  DIFFERENTIAL      Result Value Range   Neutrophils Relative % 45  43 - 77 %   Neutro Abs 2.3  1.7 - 7.7 K/uL   Lymphocytes Relative 38  12 - 46 %   Lymphs Abs 1.9  0.7 - 4.0 K/uL   Monocytes Relative 14 (*) 3 - 12 %   Monocytes Absolute 0.7  0.1 - 1.0 K/uL   Eosinophils Relative 3  0 - 5 %   Eosinophils Absolute 0.1  0.0 - 0.7 K/uL   Basophils Relative 0  0 - 1 %   Basophils Absolute 0.0  0.0 - 0.1 K/uL  COMPREHENSIVE METABOLIC PANEL      Result Value Range   Sodium 141  135 - 145 mEq/L   Potassium 4.0  3.5 - 5.1 mEq/L   Chloride 107  96 - 112 mEq/L   CO2 23  19 - 32 mEq/L   Glucose, Bld 102 (*) 70 - 99 mg/dL   BUN 14  6 - 23 mg/dL   Creatinine, Ser 1.30  0.50 - 1.10 mg/dL   Calcium 8.4  8.4 - 86.5 mg/dL   Total Protein 6.3  6.0 - 8.3 g/dL   Albumin 3.4 (*) 3.5 - 5.2 g/dL   AST 15  0 - 37 U/L   ALT 13  0 - 35 U/L   Alkaline Phosphatase 67  39 - 117 U/L   Total Bilirubin 0.7  0.3 - 1.2 mg/dL   GFR calc non Af Amer 46 (*) >90 mL/min   GFR calc Af Amer 53 (*) >90 mL/min  TROPONIN I      Result Value Range   Troponin I <0.30  <0.30 ng/mL  URINE RAPID DRUG SCREEN (HOSP PERFORMED)       Result Value Range   Opiates NONE DETECTED  NONE DETECTED   Cocaine NONE DETECTED  NONE DETECTED   Benzodiazepines NONE DETECTED  NONE DETECTED   Amphetamines NONE DETECTED  NONE DETECTED   Tetrahydrocannabinol NONE DETECTED  NONE DETECTED   Barbiturates NONE DETECTED  NONE DETECTED  URINALYSIS, ROUTINE W REFLEX MICROSCOPIC      Result Value Range   Color, Urine YELLOW  YELLOW   APPearance CLEAR  CLEAR   Specific Gravity, Urine 1.014  1.005 - 1.030   pH 5.5  5.0 - 8.0   Glucose, UA NEGATIVE  NEGATIVE mg/dL   Hgb urine dipstick NEGATIVE  NEGATIVE   Bilirubin Urine NEGATIVE  NEGATIVE   Ketones, ur NEGATIVE  NEGATIVE mg/dL   Protein, ur NEGATIVE  NEGATIVE mg/dL   Urobilinogen, UA 1.0  0.0 - 1.0 mg/dL   Nitrite NEGATIVE  NEGATIVE   Leukocytes, UA NEGATIVE  NEGATIVE  HEMOGLOBIN A1C      Result Value Range   Hemoglobin A1C 5.2  <5.7 %   Mean Plasma Glucose 103  <117 mg/dL  LIPID PANEL      Result Value Range   Cholesterol 136  0 - 200 mg/dL   Triglycerides 71  <161 mg/dL   HDL 55  >09 mg/dL   Total CHOL/HDL Ratio 2.5     VLDL 14  0 - 40 mg/dL   LDL Cholesterol 67  0 - 99 mg/dL  GLUCOSE, CAPILLARY      Result Value Range   Glucose-Capillary 110 (*) 70 - 99 mg/dL  CBC WITH DIFFERENTIAL      Result Value Range   WBC 6.1  4.0 - 10.5 K/uL   RBC 3.41 (*) 3.87 - 5.11 MIL/uL   Hemoglobin 10.3 (*) 12.0 - 15.0 g/dL   HCT 60.4 (*) 54.0 - 98.1 %   MCV 91.2  78.0 - 100.0 fL   MCH 30.2  26.0 - 34.0 pg   MCHC 33.1  30.0 - 36.0 g/dL   RDW 19.1  47.8 - 29.5 %   Platelets 128 (*) 150 - 400 K/uL   Neutrophils Relative % 61  43 - 77 %   Neutro Abs 3.7  1.7 - 7.7 K/uL   Lymphocytes Relative 25  12 - 46 %   Lymphs Abs 1.5  0.7 - 4.0 K/uL   Monocytes Relative 11  3 - 12 %   Monocytes Absolute 0.7  0.1 - 1.0 K/uL   Eosinophils Relative 3  0 - 5 %   Eosinophils Absolute 0.2  0.0 - 0.7 K/uL   Basophils Relative 0  0 - 1 %   Basophils Absolute 0.0  0.0 - 0.1 K/uL  BASIC METABOLIC PANEL       Result Value Range   Sodium 142  135 - 145 mEq/L   Potassium 3.7  3.5 - 5.1 mEq/L   Chloride 108  96 - 112 mEq/L   CO2 27  19 - 32 mEq/L   Glucose, Bld 85  70 - 99 mg/dL   BUN 14  6 - 23 mg/dL   Creatinine, Ser 6.21 (*) 0.50 - 1.10 mg/dL   Calcium 8.8  8.4 - 30.8 mg/dL   GFR calc non Af Amer 37 (*) >90 mL/min   GFR calc Af Amer 43 (*) >90 mL/min  POCT I-STAT, CHEM 8      Result Value Range   Sodium 143  135 - 145 mEq/L   Potassium 4.0  3.5 - 5.1 mEq/L   Chloride 108  96 - 112 mEq/L   BUN 14  6 - 23 mg/dL   Creatinine, Ser 6.57  0.50 - 1.10 mg/dL   Glucose, Bld 846 (*) 70 - 99 mg/dL   Calcium, Ion 9.62  9.52 - 1.30 mmol/L   TCO2 25  0 - 100 mmol/L   Hemoglobin 10.9 (*) 12.0 - 15.0 g/dL   HCT 84.1 (*) 32.4 - 40.1 %  POCT I-STAT TROPONIN I  Result Value Range   Troponin i, poc 0.00  0.00 - 0.08 ng/mL   Comment 3            Ct Head Wo Contrast  07/11/2013   CLINICAL DATA:  Slurred speech.  EXAM: CT HEAD WITHOUT CONTRAST  TECHNIQUE: Contiguous axial images were obtained from the base of the skull through the vertex without intravenous contrast.  COMPARISON:  MR dated 01/28/2013.  FINDINGS: Minimally enlarged ventricles and subarachnoid spaces. Mild patchy white matter low density in both cerebral hemispheres. This remains more focal in the left frontal lobe. No intracranial hemorrhage, mass lesion or CT evidence of acute infarction. Unremarkable bones and included paranasal sinuses.  IMPRESSION: No acute abnormality. Minimal atrophy and mild chronic small vessel white matter ischemic changes.  These results were called by telephone at the time of interpretation on 07/11/2013 at 8:10 PM to Dr. Dr. Vanetta Mulders , who verbally acknowledged these results.   Electronically Signed   By: Gordan Payment   On: 07/11/2013 20:11   Mr Brain Wo Contrast  07/12/2013   *RADIOLOGY REPORT*  Clinical Data:  Acute onset of left hemiparesis and dysarthria. Stroke risk factors include hypertension, advanced  age, and hyperlipidemia.  Also history of possible recent TIA.  MRI HEAD WITHOUT CONTRAST MRA HEAD WITHOUT CONTRAST  Technique:  Multiplanar, multiecho pulse sequences of the brain and surrounding structures were obtained without intravenous contrast. Angiographic images of the head were obtained using MRA technique without contrast.  Comparison:  CT head 07/11/2013.  MRI HEAD  Findings:  Acute right MCA territory infarction affects the insula, external capsule, putamen, globus pallidus, small portion of caudate, and periventricular white matter.  Small amount of acute hemorrhage, 7 mm diameter, is noted centrally on the MPGR sequence as an area of T2* effect (image 11 series 8).  Moderate atrophy.  Chronic microvascular ischemic change.  No areas of lacunar infarction.  No large vessel infarct.  No midline shift. Flow voids are maintained.  Normal pituitary and cerebellar tonsils.  No osseous lesions.  Clear sinuses and mastoids. Bilateral cataract extraction.  Compared to prior CT, the area of infarction and hemorrhage was not visible.  IMPRESSION: Acute right MCA lenticulostriate territory infarct.  Slight central hemorrhage.  MRA HEAD  Findings: The internal carotid arteries are widely patent.  The basilar arteries widely patent.  Vertebrals codominant.  There is no proximal stenosis of the anterior, middle, or posterior cerebral arteries.  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 No MCA branch occlusion is present. There is no proximal M1 irregularity.  No cerebellar branch occlusion is seen.  There is no intracranial aneurysm.  IMPRESSION: Intracranial atherosclerotic changes as described.  No significant M1 stenosis on the right, but moderately severe right M2 disease is identified; These  lesions are distal to the observed pattern of acute infarction.   Original Report Authenticated By: Davonna Belling, M.D.   Mr Mra Head/brain Wo  Cm  07/12/2013   *RADIOLOGY REPORT*  Clinical Data:  Acute onset of left hemiparesis and dysarthria. Stroke risk factors include hypertension, advanced age, and hyperlipidemia.  Also history of possible recent TIA.  MRI HEAD WITHOUT CONTRAST MRA HEAD WITHOUT CONTRAST  Technique:  Multiplanar, multiecho pulse sequences of the brain and surrounding structures were obtained without intravenous contrast. Angiographic images of the head were obtained using MRA technique without contrast.  Comparison:  CT head 07/11/2013.  MRI HEAD  Findings:  Acute right MCA territory infarction affects the insula, external capsule, putamen, globus pallidus, small portion of caudate, and periventricular white matter.  Small amount of acute hemorrhage, 7 mm diameter, is noted centrally on the MPGR sequence as an area of T2* effect (image 11 series 8).  Moderate atrophy.  Chronic microvascular ischemic change.  No areas of lacunar infarction.  No large vessel infarct.  No midline shift. Flow voids are maintained.  Normal pituitary and cerebellar tonsils.  No osseous lesions.  Clear sinuses and mastoids. Bilateral cataract extraction.  Compared to prior CT, the area of infarction and hemorrhage was not visible.  IMPRESSION: Acute right MCA lenticulostriate territory infarct.  Slight central hemorrhage.  MRA HEAD  Findings: The internal carotid arteries are widely patent.  The basilar arteries widely patent.  Vertebrals codominant.  There is no proximal stenosis of the anterior, middle, or posterior cerebral arteries.  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 No MCA branch occlusion is present. There is no proximal M1 irregularity.  No cerebellar branch occlusion is seen.  There is no intracranial aneurysm.  IMPRESSION: Intracranial atherosclerotic changes as described.  No significant M1 stenosis on the right, but moderately severe right M2 disease is  identified; These  lesions are distal to the observed pattern of acute infarction.   Original Report Authenticated By: Davonna Belling, M.D.    Patient arrived as a code stroke with difficulty peaking and left sided weakness to arm leg and face. Stroke protocols enacted, seen by stroke team, head CT with evidence of bleed, and patient candidate for TPA. TPA initiated.   CRITICAL CARE Performed by: Shelda Jakes. Total critical care time: 30 Critical care time was exclusive of separately billable procedures and treating other patients. Critical care was necessary to treat or prevent imminent or life-threatening deterioration. Critical care was time spent personally by me on the following activities: development of treatment plan with patient and/or surrogate as well as nursing, discussions with consultants, evaluation of patient's response to treatment, examination of patient, obtaining history from patient or surrogate, ordering and performing treatments and interventions, ordering and review of laboratory studies, ordering and review of radiographic studies, pulse oximetry and re-evaluation of patient's condition.    Shelda Jakes, MD 07/18/13 1017

## 2013-07-18 NOTE — Telephone Encounter (Signed)
I called pt and she had TEE when she was inpt at [facility].  I told her I was following up on a message.  She still was hoarse, but better she stated, using chloroseptic lozenges.  I told her that if she worsened to contact pcp.  She verbalized understanding.

## 2013-07-23 ENCOUNTER — Telehealth: Payer: Self-pay | Admitting: Neurology

## 2013-07-24 NOTE — Telephone Encounter (Signed)
I called the patient emergency contact number but it was not in service .

## 2013-07-25 ENCOUNTER — Encounter (HOSPITAL_COMMUNITY): Payer: Self-pay | Admitting: *Deleted

## 2013-07-25 ENCOUNTER — Observation Stay (HOSPITAL_COMMUNITY)
Admission: AD | Admit: 2013-07-25 | Discharge: 2013-07-26 | Disposition: A | Discharge status: Home / Self Care | Payer: Medicare Other | Source: Ambulatory Visit | Attending: Internal Medicine | Admitting: Internal Medicine

## 2013-07-25 ENCOUNTER — Ambulatory Visit: Payer: Medicare Other | Admitting: *Deleted

## 2013-07-25 ENCOUNTER — Ambulatory Visit (INDEPENDENT_AMBULATORY_CARE_PROVIDER_SITE_OTHER): Payer: Medicare Other | Admitting: Internal Medicine

## 2013-07-25 ENCOUNTER — Encounter (HOSPITAL_COMMUNITY)
Admission: AD | Disposition: A | Discharge status: Home / Self Care | Payer: Self-pay | Source: Ambulatory Visit | Attending: Internal Medicine

## 2013-07-25 DIAGNOSIS — I495 Sick sinus syndrome: Secondary | ICD-10-CM

## 2013-07-25 DIAGNOSIS — I639 Cerebral infarction, unspecified: Secondary | ICD-10-CM

## 2013-07-25 DIAGNOSIS — R55 Syncope and collapse: Secondary | ICD-10-CM

## 2013-07-25 DIAGNOSIS — G8194 Hemiplegia, unspecified affecting left nondominant side: Secondary | ICD-10-CM

## 2013-07-25 DIAGNOSIS — I63511 Cerebral infarction due to unspecified occlusion or stenosis of right middle cerebral artery: Secondary | ICD-10-CM

## 2013-07-25 DIAGNOSIS — Z4509 Encounter for adjustment and management of other cardiac device: Secondary | ICD-10-CM | POA: Insufficient documentation

## 2013-07-25 DIAGNOSIS — E785 Hyperlipidemia, unspecified: Secondary | ICD-10-CM

## 2013-07-25 DIAGNOSIS — E039 Hypothyroidism, unspecified: Secondary | ICD-10-CM

## 2013-07-25 DIAGNOSIS — I69322 Dysarthria following cerebral infarction: Secondary | ICD-10-CM

## 2013-07-25 DIAGNOSIS — Z8673 Personal history of transient ischemic attack (TIA), and cerebral infarction without residual deficits: Secondary | ICD-10-CM | POA: Insufficient documentation

## 2013-07-25 DIAGNOSIS — I10 Essential (primary) hypertension: Secondary | ICD-10-CM

## 2013-07-25 DIAGNOSIS — I4891 Unspecified atrial fibrillation: Secondary | ICD-10-CM | POA: Insufficient documentation

## 2013-07-25 DIAGNOSIS — Z79899 Other long term (current) drug therapy: Secondary | ICD-10-CM

## 2013-07-25 DIAGNOSIS — F411 Generalized anxiety disorder: Secondary | ICD-10-CM | POA: Insufficient documentation

## 2013-07-25 HISTORY — PX: PERMANENT PACEMAKER INSERTION: SHX5480

## 2013-07-25 LAB — CBC WITH DIFFERENTIAL/PLATELET
Eosinophils Absolute: 0.1 10*3/uL (ref 0.0–0.7)
Hemoglobin: 12.2 g/dL (ref 12.0–15.0)
Lymphocytes Relative: 30 % (ref 12–46)
Lymphs Abs: 1.8 10*3/uL (ref 0.7–4.0)
MCH: 30.3 pg (ref 26.0–34.0)
Monocytes Absolute: 0.4 10*3/uL (ref 0.1–1.0)
Monocytes Relative: 7 % (ref 3–12)
Neutro Abs: 3.7 10*3/uL (ref 1.7–7.7)
Neutrophils Relative %: 61 % (ref 43–77)
RBC: 4.02 MIL/uL (ref 3.87–5.11)
WBC: 6 10*3/uL (ref 4.0–10.5)

## 2013-07-25 LAB — MAGNESIUM: Magnesium: 1.9 mg/dL (ref 1.5–2.5)

## 2013-07-25 LAB — BASIC METABOLIC PANEL
BUN: 30 mg/dL — ABNORMAL HIGH (ref 6–23)
CO2: 26 mEq/L (ref 19–32)
Chloride: 105 mEq/L (ref 96–112)
Glucose, Bld: 96 mg/dL (ref 70–99)
Potassium: 4.5 mEq/L (ref 3.5–5.1)

## 2013-07-25 LAB — PACEMAKER DEVICE OBSERVATION

## 2013-07-25 SURGERY — PERMANENT PACEMAKER INSERTION
Anesthesia: LOCAL

## 2013-07-25 MED ORDER — ASPIRIN EC 325 MG PO TBEC
325.0000 mg | DELAYED_RELEASE_TABLET | Freq: Every day | ORAL | Status: DC
  Administered 2013-07-26: 325 mg via ORAL
  Filled 2013-07-25 (×2): qty 1

## 2013-07-25 MED ORDER — MECLIZINE HCL 25 MG PO TABS
25.0000 mg | ORAL_TABLET | Freq: Three times a day (TID) | ORAL | Status: DC | PRN
  Filled 2013-07-25: qty 1

## 2013-07-25 MED ORDER — SODIUM CHLORIDE 0.9 % IV SOLN
INTRAVENOUS | Status: DC
  Administered 2013-07-25: 15:00:00 via INTRAVENOUS

## 2013-07-25 MED ORDER — SODIUM CHLORIDE 0.9 % IV SOLN
INTRAVENOUS | Status: AC
  Administered 2013-07-25: 19:00:00 via INTRAVENOUS

## 2013-07-25 MED ORDER — ATORVASTATIN CALCIUM 10 MG PO TABS
10.0000 mg | ORAL_TABLET | Freq: Every day | ORAL | Status: DC
  Administered 2013-07-25: 10 mg via ORAL
  Filled 2013-07-25 (×2): qty 1

## 2013-07-25 MED ORDER — CHLORHEXIDINE GLUCONATE 4 % EX LIQD
60.0000 mL | Freq: Once | CUTANEOUS | Status: AC
  Administered 2013-07-25: 4 via TOPICAL
  Filled 2013-07-25: qty 60

## 2013-07-25 MED ORDER — PANTOPRAZOLE SODIUM 40 MG PO TBEC
40.0000 mg | DELAYED_RELEASE_TABLET | Freq: Every day | ORAL | Status: DC
  Filled 2013-07-25: qty 1

## 2013-07-25 MED ORDER — ALPRAZOLAM 0.25 MG PO TABS
0.1250 mg | ORAL_TABLET | Freq: Every day | ORAL | Status: DC | PRN
  Administered 2013-07-25: 0.125 mg via ORAL
  Filled 2013-07-25: qty 1

## 2013-07-25 MED ORDER — MIDAZOLAM HCL 5 MG/5ML IJ SOLN
INTRAMUSCULAR | Status: AC
  Filled 2013-07-25: qty 5

## 2013-07-25 MED ORDER — ACETAMINOPHEN 325 MG PO TABS
650.0000 mg | ORAL_TABLET | ORAL | Status: DC | PRN
  Administered 2013-07-25: 650 mg via ORAL

## 2013-07-25 MED ORDER — LIDOCAINE HCL (PF) 1 % IJ SOLN
INTRAMUSCULAR | Status: AC
  Filled 2013-07-25: qty 60

## 2013-07-25 MED ORDER — VANCOMYCIN HCL IN DEXTROSE 1-5 GM/200ML-% IV SOLN
1000.0000 mg | INTRAVENOUS | Status: DC
  Filled 2013-07-25 (×3): qty 200

## 2013-07-25 MED ORDER — LEVOTHYROXINE SODIUM 100 MCG PO TABS
100.0000 ug | ORAL_TABLET | Freq: Every day | ORAL | Status: DC
  Administered 2013-07-26: 100 ug via ORAL
  Filled 2013-07-25 (×2): qty 1

## 2013-07-25 MED ORDER — VANCOMYCIN HCL IN DEXTROSE 1-5 GM/200ML-% IV SOLN
1000.0000 mg | Freq: Two times a day (BID) | INTRAVENOUS | Status: AC
  Administered 2013-07-25: 1000 mg via INTRAVENOUS
  Filled 2013-07-25: qty 200

## 2013-07-25 MED ORDER — FENTANYL CITRATE 0.05 MG/ML IJ SOLN
INTRAMUSCULAR | Status: AC
  Filled 2013-07-25: qty 2

## 2013-07-25 MED ORDER — ACETAMINOPHEN 325 MG PO TABS
325.0000 mg | ORAL_TABLET | ORAL | Status: DC | PRN
  Filled 2013-07-25: qty 2

## 2013-07-25 MED ORDER — LISINOPRIL 10 MG PO TABS
10.0000 mg | ORAL_TABLET | Freq: Two times a day (BID) | ORAL | Status: DC
  Administered 2013-07-25: 10 mg via ORAL
  Filled 2013-07-25 (×3): qty 1

## 2013-07-25 MED ORDER — LEVOTHYROXINE SODIUM 100 MCG PO TABS: 100.0000 ug | ORAL_TABLET | Freq: Every day | ORAL | Status: DC

## 2013-07-25 MED ORDER — ONDANSETRON HCL 4 MG/2ML IJ SOLN: 4.0000 mg | Freq: Four times a day (QID) | INTRAMUSCULAR | Status: DC | PRN

## 2013-07-25 MED ORDER — SODIUM CHLORIDE 0.9 % IR SOLN
80.0000 mg | Status: DC
  Filled 2013-07-25 (×3): qty 2

## 2013-07-25 MED ORDER — SODIUM CHLORIDE 0.9 % IV SOLN: INTRAVENOUS | Status: DC

## 2013-07-25 MED ORDER — VITAMIN D3 25 MCG (1000 UNIT) PO TABS
2000.0000 [IU] | ORAL_TABLET | Freq: Every day | ORAL | Status: DC
  Filled 2013-07-25: qty 2

## 2013-07-25 MED ORDER — METOPROLOL SUCCINATE ER 25 MG PO TB24
25.0000 mg | ORAL_TABLET | Freq: Two times a day (BID) | ORAL | Status: DC
  Administered 2013-07-25: 25 mg via ORAL
  Filled 2013-07-25 (×3): qty 1

## 2013-07-25 NOTE — H&P (Signed)
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

## 2013-07-25 NOTE — H&P (Signed)
ELECTROPHYSIOLOGY ADMISSION HISTORY & PHYSICAL  Patient ID: Jessica Myers MRN: [id number], DOB/AGE: 77/31/1930   Date of Admission: 07/25/2013  Primary Physician: Jessica Peri, MD Primary Cardiologist: New to [facility] Reason for Admission: Tachy-brady syndrome  History of Present Illness Jessica Myers is a very pleasant 79 year old woman who was admitted to the hospital several weeks ago with a stroke. She received thrombolytic therapy with neurologic recovery. Subsequent workup demonstrated no obvious etiology of her stroke. She underwent insertion of an implantable loop recorder.   She presented to the office today for evaluation of syncope. She was in her usual state of health until yesterday when she fell to the ground. She denies warning or prodrome. She did not injure herself. She reports a remote history of prior episodes similar to this. In addition, the patient experiences "fluttering" palpitations with vigorous strenuous activity. Since her stroke, she has very little neurologic impairment and has returned to her daily activities. She denies chest pain or shortness of breath.   ILR interrogation performed in the office today which revealed 5 second pauses and newly diagnosed AF with rapid rates. She is being admitted for PPM implantation.  Past Medical History Past Medical History  Diagnosis Date  . Hypertension   . Vertical vertigo   . Thyroid disease   . High cholesterol   . Anxiety   . [id number](478.2)     Past Surgical History Past Surgical History  Procedure Laterality Date  . Cholecystectomy    . Tee without cardioversion N/A 07/16/2013    Procedure: TRANSESOPHAGEAL ECHOCARDIOGRAM (TEE);  Surgeon: Vesta Mixer, MD;  Location: [facility] ENDOSCOPY;  Service: Cardiovascular;  Laterality: N/A;    Allergies/Intolerances Allergies  Allergen Reactions  . Penicillins     Hives   . Shellfish Allergy     hives    Home Medications Medications Prior to Admission    Medication Sig Dispense Refill  . ALPRAZolam (XANAX) 0.5 MG tablet Take 0.5 mg by mouth as needed (takes 1/2 of a 1/2 PRN at night).       Marland Kitchen aspirin 325 MG tablet Take 1 tablet (325 mg total) by mouth daily.      Marland Kitchen atorvastatin (LIPITOR) 10 MG tablet Take 10 mg by mouth daily.      Marland Kitchen levothyroxine (SYNTHROID, LEVOTHROID) 100 MCG tablet Take 100 mcg by mouth daily.      Marland Kitchen lisinopril (PRINIVIL,ZESTRIL) 10 MG tablet Take 10 mg by mouth 2 (two) times daily.      . meclizine (ANTIVERT) 25 MG tablet Take 25 mg by mouth 3 (three) times daily as needed for dizziness.      . metoprolol succinate (TOPROL-XL) 25 MG 24 hr tablet Take 25 mg by mouth 2 (two) times daily.      Marland Kitchen omeprazole (PRILOSEC) 20 MG capsule Take 20 mg by mouth daily.        Family History Positive for CAD   Social History Social History  . Marital Status: Married   Social History Main Topics  . Smoking status: Never Smoker   . Smokeless tobacco: Never Used  . Alcohol Use: No  . Drug Use: No   Review of Systems General: No chills, fever, night sweats or weight changes.  Cardiovascular: No chest pain, dyspnea on exertion, edema, orthopnea, paroxysmal nocturnal dyspnea. Dermatological: No rash, lesions or masses. Respiratory: No cough, dyspnea. Urologic: No hematuria, dysuria. Abdominal: No nausea, vomiting, diarrhea, bright red blood per rectum, melena, or hematemesis. Neurologic: No visual changes All other systems  reviewed and are otherwise negative except as noted above.  Physical Exam Vitals: Blood pressure 155/62, pulse 63, temperature 97.9 F (36.6 C), temperature source Oral, resp. rate 18, height 5\' 5"  (1.651 m), weight 136 lb 11 oz (62 kg), SpO2 97.00%.  General: Well developed, well appearing 79 y.o. female in no acute distress. HEENT: Normocephalic, atraumatic. EOMs intact. Sclera nonicteric. Oropharynx clear.  Neck: Supple without bruits. No JVD. Lungs: Respirations regular and unlabored, CTA bilaterally.  No wheezes, rales or rhonchi. Heart: RRR. S1, S2 present. No murmurs, rub, S3 or S4. Abdomen: Soft, non-tender, non-distended. BS present x 4 quadrants. No hepatosplenomegaly.  Extremities: No clubbing, cyanosis or edema. DP/PT/Radials 2+ and equal bilaterally. Psych: Normal affect. Neuro: Alert and oriented X 3. Moves all extremities spontaneously. Musculoskeletal: No kyphosis. Skin: Intact. Warm and dry. No rashes or petechiae in exposed areas.   Recent Labs (admission labs are pending) Lab Results  Component Value Date   WBC 6.1 07/14/2013   HGB 10.3* 07/14/2013   HCT 31.1* 07/14/2013   MCV 91.2 07/14/2013   PLT 128* 07/14/2013      Component Value Date/Time   NA 142 07/14/2013 0546   K 3.7 07/14/2013 0546   CL 108 07/14/2013 0546   CO2 27 07/14/2013 0546   GLUCOSE 85 07/14/2013 0546   BUN 14 07/14/2013 0546   CREATININE 1.29* 07/14/2013 0546   CALCIUM 8.8 07/14/2013 0546   GFRNONAA 37* 07/14/2013 0546   GFRAA 43* 07/14/2013 0546    Radiology/Studies MRI / MRA Head/brain Wo Cm 07/12/2013   MRI HEAD IMPRESSION: Acute right MCA lenticulostriate territory infarct.  Slight central hemorrhage.   MRA HEAD IMPRESSION: Intracranial atherosclerotic changes as described.  No significant M1 stenosis on the right, but moderately severe right M2 disease is identified; These  lesions are distal to the observed pattern of acute infarction.    Original Report Authenticated By: Davonna Belling, M.D.   Echocardiogram 07/14/2013 Study Conclusions - Left ventricle: The cavity size was normal. Wall thickness was normal. Systolic function was normal. The estimated ejection fraction was in the range of 60% to 65%. Wall motion was normal; there were no regional wall motion abnormalities. Doppler parameters are consistent with abnormal left ventricular relaxation (grade 1 diastolic dysfunction). - Aortic valve: Mild regurgitation. - Atrial septum: No defect or patent foramen ovale was identified. -  Pulmonary arteries: PA peak pressure: 41mm Hg (S).  12-lead ECG pending Telemetry currently in SR   Assessment and Plan 1. Symptomatic bradycardia with syncope and documented 5 second pauses 2. Paroxysmal atrial fibrillation, newly diagnosed 3. Recent stroke - R MCA infarct s/p tPA; evidence of slight central hemorrhage on MRI; discharged on ASA  As outlined in Dr. Lubertha Basque note, the critical nature of her condition was discussed in detail. She will need permanent pacemaker insertion. She is on low-dose beta blocker therapy but has had documented atrial fibrillation with very rapid ventricular rates. She has tachy-brady syndrome. With history of stroke, she will need to be systemically anticoagulated. Dr. Ladona Ridgel discussed the treatment options with Ms. Lemanski and her husband today in detail. She is willing to be admitted to the hospital to undergo permanent pacemaker insertion. Risks, benefits and alternatives to PPM implantation were discussed. These risks include, but are not limited to, bleeding, infection, pneumothorax, perforation, tamponade, vascular damage, renal failure, lead dislodgement, MI, stroke and death. Ms. Meacham expressed verbal understanding and agrees to proceed. Following pacemaker implantation, she will need systemic anticoagulation.  Signed, Rick Duff, PA-C 07/25/2013,  12:39 PM

## 2013-07-25 NOTE — CV Procedure (Signed)
Jessica Myers [id number]  [id number]  Preop Dx: syncope and sinus brady Postop Dx same/   Procedure:dual chamber pacemaker insertion; loop recorder explant  After routine prep and drape, lidocaine was infiltrated in the prepectoral subclavicular region on the left side an incision was made and carried down to layer of  the prepectoral fascia using electrocautery and sharp dissection;  a pocket was formed similarly. Hemostasis was obtained.  After this, we turned our attention to gaining access to the extrathoracic,left subclavian vein. This was accomplished without difficulty and without the aspiration of air or puncture of the artery. 2 separate venipunctures were accomplished; guidewires were placed and retained and sequentially 7 French sheaths  were placed through which were passed a  [employer] ventricular lead, serial number [id number] and a   St Jude [id number] atrial lead, serial number [id number].  The ventricular lead was manipulated to the right ventricular apex where the bipolar R wave was 9.6, the pacing impedance was 1276, the threshold was  0.7 V  @ 0.5 msec .  Current at threshold was   0.5 ma.  The right atrial lead was manipulated to the right atrial appendage  where the bipolar P-wave was 4.7, the pacing impedance was 628, the threshold was 0.5 V @ 0.5 msec.  Current at threshold was 0.9 ma  The leads were affixed to the prepectoral fascia and attached to a  Medtronic  pulse generator.  Serial number [id number]  Hemostasis was obtained. The pocket was copiously irrigated with antibiotic containing saline solution. The leads and the pulse generator were placed in the pocket and affixed to the prepectoral fascia. The wound was then closed in 3 layers in normal fashion. The wound was washed dried and a DERMABOND was applied.  Needle count, sponge count  and instrument counts were correct at the end of the procedure .Marland Kitchen    Lidocaine was infiltrated above the loop recorder and an incision  made and carried down to the loop  It was extracted with kelly  The patient tolerated the procedure without apparent complication.    Duke Salvia M.D. 07/25/2013 5:47 PM

## 2013-07-25 NOTE — Progress Notes (Signed)
Patient ID: Jessica Myers, female   DOB: 1929-08-02, 79 y.o.   MRN: [id number] Jessica Myers is an 79 y.o. female.   Chief Complaint: syncope HPI: The patient is a very pleasant 79 year old woman who was admitted to the hospital several weeks ago with a stroke. She received thrombolytic therapy with neurologic recovery. Subsequent workup demonstrated no obvious etiology of her stroke. She underwent insertion of an implantable loop recorder. She was in her usual state of health until yesterday when she fell to the ground. She was not sure if she actually passed out or not. She did not injure herself. She reports a remote history of prior episodes similar to this. In addition, the patient experiences palpitations which he does vigorous strenuous activity. Since her stroke, she has very little neurologic impairment and has returned to her daily activities. She denies chest pain or shortness of breath.  Past Medical History  Diagnosis Date  . Hypertension   . Vertical vertigo   . Thyroid disease   . High cholesterol   . Anxiety   . ZHYQMVHQ(469.6)     Past Surgical History  Procedure Laterality Date  . Cholecystectomy    . Tee without cardioversion N/A 07/16/2013    Procedure: TRANSESOPHAGEAL ECHOCARDIOGRAM (TEE);  Surgeon: Jessica Mixer, MD;  Location: [facility] ENDOSCOPY;  Service: Cardiovascular;  Laterality: N/A;    No family history on file. Social History:  reports that she has never smoked. She has never used smokeless tobacco. She reports that she does not drink alcohol or use illicit drugs.  Allergies:  Allergies  Allergen Reactions  . Penicillins     Hives   . Shellfish Allergy     hives     (Not in a hospital admission)  No results found for this or any previous visit (from the past 48 hour(s)). No results found.  ROS - all systems reviewed and negative except as noted in the history of present illness.  Physical Exam  Blood pressure 118/76, pulse 58, respirations  18 Well appearing 79 year old woman, NAD HEENT: Unremarkable Neck:  No JVD, no thyromegally Back:  No CVA tenderness Lungs:  Clear with no wheezes, rales, or rhonchi. HEART:  Regular rate rhythm, no murmurs, no rubs, no clicks Abd:  Flat, positive bowel sounds, no organomegally, no rebound, no guarding Ext:  2 plus pulses, no edema, no cyanosis, no clubbing Skin:  No rashes no nodules Neuro:  CN II through XII intact, motor grossly intact  EKG: - pending Chest x-ray - pending  Assessment/Plan 1. Symptomatic bradycardia with 5 second pauses 2. Paroxysmal atrial fibrillation 3. Recent stroke, initially of unclear etiology, now clearly related to atrial fibrillation Recommendation: I discussed the severe nature of the patient's situation. She will need permanent pacemaker insertion. The patient is on low-dose beta blocker therapy, but has had documented atrial fibrillation with very rapid ventricular rates. She has tachybrady syndrome. With her history of stroke, she will need to be systemically anticoagulated. I discussed the treatment options with the patient and her husband in detail. She is willing to be admitted to the hospital to undergo permanent pacemaker insertion. Hopefully this can be done today. Following pacemaker implantation, she will need systemic anticoagulation.  Lewayne Bunting, M.D.  Lewayne Bunting 07/25/2013, 11:46 AM

## 2013-07-25 NOTE — Progress Notes (Signed)
Direct admission into 2W26. Pt assessed, monitor attached, PA paged for orders, and IV inserted. Will continue to monitor pt closely.

## 2013-07-25 NOTE — Progress Notes (Signed)
Utilization Review Completed.Jessica Myers T10/06/2013

## 2013-07-26 DIAGNOSIS — I495 Sick sinus syndrome: Principal | ICD-10-CM

## 2013-07-26 MED ORDER — APIXABAN 2.5 MG PO TABS: 2.5000 mg | ORAL_TABLET | Freq: Two times a day (BID) | ORAL | Status: DC

## 2013-07-26 NOTE — Discharge Summary (Signed)
ELECTROPHYSIOLOGY DISCHARGE SUMMARY    Patient ID: Jessica Myers,  MRN: [id number], DOB/AGE: Mar 21, 1929 79 y.o.  Admit date: 07/25/2013 Discharge date: 07/26/2013  Primary Care Physician: Kirstie Peri, MD   Primary Cardiologist: New to [facility]  Primary Discharge Diagnosis:  1. Symptomatic bradycardia with syncope and documented 5 second pauses s/p PPM implantation this admission 2. Paroxysmal atrial fibrillation, newly diagnosed   Secondary Discharge Diagnoses:  1. Recent stroke - R MCA infarct s/p tPA 2. HTN 3. Hypothyroidism 4. Anxiety  Procedures This Admission:  1. ILR explant 2. Dual chamber PPM implantation  RA lead - St Jude 1944 atrial lead, serial number [id number] RV lead - St Jude 1948 ventricular lead, serial number [id number]  Device - Medtronic pulse generator, serial number [id number]  History and Hospital Course:  Jessica Myers is a pleasant 79 year old woman with recent stroke s/p ILR insertion who presented to the office yesterday with syncope. She was found to have tachy-brady syndrome. She had documented sinus pauses >5 seconds in duration, symptomatic with syncope. She also had paroxysmal AF w/RVR. She therefore presented for PPM implantation. [name] tolerated this procedure well without any immediate complication. She remains hemodynamically stable and afebrile. Her chest xray shows stable lead placement without pneumothorax. Her device interrogation shows normal PPM function with stable lead parameters/measurements. Her implant site is intact without significant bleeding or hematoma. She has been given discharge instructions including wound care and activity restrictions. She will follow-up in 10 days for wound check. She will start Eliquis 2.5 mg twice daily on Monday 07/29/2013. She will stop aspirin. Otherwise no changes were made to her medications. She has been seen, examined and deemed stable for discharge today by Dr. Berton Mount.  Physical Exam:    Vitals: Blood pressure 129/62, pulse 60, temperature 98.2 F (36.8 C), temperature source Oral, resp. rate 20, height 5\' 5"  (1.651 m), weight 136 lb 11 oz (62 kg), SpO2 97.00%.  General: Well developed, well appearing 79 y.o. female in no acute distress.  HEENT: Normocephalic, atraumatic. EOMs intact. Sclera nonicteric. Oropharynx clear.  Neck: Supple without bruits. No JVD.  Lungs: Respirations regular and unlabored, CTA bilaterally. No wheezes, rales or rhonchi.  Heart: RRR. S1, S2 present. No murmurs, rub, S3 or S4.  Abdomen: Soft, non-tender, non-distended. BS present x 4 quadrants. No hepatosplenomegaly.  Extremities: No clubbing, cyanosis or edema. DP/PT/Radials 2+ and equal bilaterally.  Neuro: Alert and oriented X 3. Moves all extremities spontaneously.  Skin: Left upper chest / implant site intact without bleeding or hematoma.  Labs: Lab Results  Component Value Date   WBC 6.0 07/25/2013   HGB 12.2 07/25/2013   HCT 36.8 07/25/2013   MCV 91.5 07/25/2013   PLT 242 07/25/2013     Recent Labs Lab 07/25/13 1330  NA 141  K 4.5  CL 105  CO2 26  BUN 30*  CREATININE 1.28*  CALCIUM 9.4  GLUCOSE 96    Disposition:  The patient is being discharged in stable condition.  Follow-up:     Follow-up Information   Follow up with [facility] On 08/07/2013. (At 10:30 AM for wound check)    Specialty:  Cardiology   Contact information:   [address], [address] [place] [address] [phone]      Follow up with [facility] In 3 months. (For pacemaker follow-up with Dr. Ladona Ridgel; Our office will mail letter with appointment date and time)    Specialty:  Cardiology   Contact information:   [address] [place] [address] [phone]     Discharge Medications:    Medication List    STOP taking these medications       aspirin EC 325 MG tablet      TAKE these medications       ALPRAZolam 0.5 MG tablet  Commonly known as:   XANAX  Take 0.125 mg by mouth daily as needed for sleep or anxiety.     apixaban 2.5 MG Tabs tablet  Commonly known as:  ELIQUIS  Take 1 tablet (2.5 mg total) by mouth 2 (two) times daily. START on Monday, 07/29/2013     atorvastatin 10 MG tablet  Commonly known as:  LIPITOR  Take 10 mg by mouth daily.     cholecalciferol 1000 UNITS tablet  Commonly known as:  VITAMIN D  Take 2,000 Units by mouth daily.     levothyroxine 100 MCG tablet  Commonly known as:  SYNTHROID, LEVOTHROID  Take 100 mcg by mouth daily.     lisinopril 10 MG tablet  Commonly known as:  PRINIVIL,ZESTRIL  Take 10 mg by mouth 2 (two) times daily.     meclizine 25 MG tablet  Commonly known as:  ANTIVERT  Take 25 mg by mouth 3 (three) times daily as needed for dizziness.     metoprolol succinate 25 MG 24 hr tablet  Commonly known as:  TOPROL-XL  Take 25 mg by mouth 2 (two) times daily.     omeprazole 20 MG capsule  Commonly known as:  PRILOSEC  Take 20 mg by mouth daily.       Duration of Discharge Encounter: Greater than 30 minutes including physician time.  Signed, Liv Rallis, PA-C 07/26/2013, 8:30 AM

## 2013-07-26 NOTE — Progress Notes (Signed)
Discharge instructions along with med list and scripts provided. IV d/c'd with catheter intact. Patient able to return demonstrate post activity regarding left arm upon request. Patient transported via wheelchair to lobby for d/c home per volunteer staff.Mamie Levers

## 2013-07-26 NOTE — Progress Notes (Signed)
Unable to edit note Note reviewed from [facility] Discharge today on apixoban 2.5 to begin Monday With stropping of ASA on Sat Will f/u owth GT in [place]

## 2013-07-26 NOTE — Progress Notes (Addendum)
Patient was given 0.125mg  of xanax and enter in pyxis 0.1125 . We tried to correct  In the pyxis but was unable to. Wasted 0.125 mg and given 0.125mg  (1/2 tablet given other half wasted in sink. wiriness by Royal Hawthorn.N.

## 2013-07-26 NOTE — Care Management Note (Signed)
    Page 1 of 1   07/26/2013     10:43:11 AM   CARE MANAGEMENT NOTE 07/26/2013  Patient:  Jessica Myers, Jessica Myers   Account Number:  [id number]  Date Initiated:  07/25/2013  Documentation initiated by:  Junius Creamer  Subjective/Objective Assessment:   adm w at fib, new pacer insert     Action/Plan:   lives w husband, pcp dr Beatrix Fetters shah   Anticipated DC Date:  07/26/2013   Anticipated DC Plan:  HOME/SELF CARE      DC Planning Services  CM consult  Medication Assistance      Choice offered to / List presented to:             Status of service:   Medicare Important Message given?   (If response is "NO", the following Medicare IM given date fields will be blank) Date Medicare IM given:   Date Additional Medicare IM given:    Discharge Disposition:  HOME/SELF CARE  Per UR Regulation:  Reviewed for med. necessity/level of care/duration of stay  If discussed at Long Length of Stay Meetings, dates discussed:    Comments:  10/10   1041a debbie Prentiss Hammett rn,bsn spoke w pt and husband. gave them 30day free eliquis card. alerted them of 95.00 per month copay. they state they will be able to afford eliquis.md will need to do prior auth for med at [phone] also.  10/9 1502 debbie Rayley Gao rn,bsn cm sec checked and if goes home on eliquis would have 95.00 per month copay and need prior auth.

## 2013-07-29 ENCOUNTER — Telehealth: Payer: Self-pay | Admitting: Internal Medicine

## 2013-07-29 NOTE — Telephone Encounter (Signed)
Will forward to CV[facility], Pablo Lawrence RN and to Addison Lank to return call.

## 2013-07-29 NOTE — Telephone Encounter (Signed)
New message   Ins comp sent a form to Dr Ladona Ridgel to complete for eliquis. What type of laxitive can pt take with eliquis?

## 2013-07-29 NOTE — Telephone Encounter (Signed)
Spoke with patient regarding taking Miralax with her Eliquis,  informed her no interactions with these 2 medications.

## 2013-08-07 ENCOUNTER — Encounter: Payer: Self-pay | Admitting: Internal Medicine

## 2013-08-07 ENCOUNTER — Ambulatory Visit (INDEPENDENT_AMBULATORY_CARE_PROVIDER_SITE_OTHER): Payer: Medicare Other | Admitting: *Deleted

## 2013-08-07 DIAGNOSIS — R55 Syncope and collapse: Secondary | ICD-10-CM

## 2013-08-07 DIAGNOSIS — I495 Sick sinus syndrome: Secondary | ICD-10-CM

## 2013-08-07 LAB — PACEMAKER DEVICE OBSERVATION
AL THRESHOLD: 0.5 V
BAMS-0001: 150 {beats}/min
BATTERY VOLTAGE: 2.79 V
RV LEAD AMPLITUDE: 8 mv
RV LEAD THRESHOLD: 0.5 V

## 2013-08-07 NOTE — Progress Notes (Signed)
Wound check ppm in clinic. Normal device function. Pt in AF 1.4% of time. + Eliquis. 1 ventricular high rate episode lasting 7 beats. Site well healed with no swelling or redness. ROV in 3 mths w/GT in [place].

## 2013-08-15 ENCOUNTER — Telehealth: Payer: Self-pay | Admitting: *Deleted

## 2013-08-15 ENCOUNTER — Ambulatory Visit (HOSPITAL_COMMUNITY)
Admission: RE | Admit: 2013-08-15 | Discharge: 2013-08-15 | Disposition: A | Discharge status: Home / Self Care | Payer: Medicare Other | Source: Ambulatory Visit | Attending: Internal Medicine | Admitting: Internal Medicine

## 2013-08-15 DIAGNOSIS — R55 Syncope and collapse: Secondary | ICD-10-CM | POA: Insufficient documentation

## 2013-08-15 DIAGNOSIS — R269 Unspecified abnormalities of gait and mobility: Secondary | ICD-10-CM | POA: Insufficient documentation

## 2013-08-15 DIAGNOSIS — IMO0001 Reserved for inherently not codable concepts without codable children: Secondary | ICD-10-CM | POA: Insufficient documentation

## 2013-08-15 DIAGNOSIS — I10 Essential (primary) hypertension: Secondary | ICD-10-CM | POA: Insufficient documentation

## 2013-08-15 NOTE — Evaluation (Signed)
Physical Therapy Evaluation  Patient Details  Name: Jessica Myers MRN: [id number] Date of Birth: 02/12/1929  Today's Date: 08/15/2013 Time: 1305-1330 PT Time Calculation (min): 25 min              Visit#: 1 of 1  Re-eval:   Assessment Diagnosis: Rt Cva  Authorization: [employer] medicare     Past Medical History:  Past Medical History  Diagnosis Date  . Hypertension   . Vertical vertigo   . Thyroid disease   . High cholesterol   . Anxiety   . KGMWNUUV(253.6)    Past Surgical History:  Past Surgical History  Procedure Laterality Date  . Cholecystectomy    . Tee without cardioversion N/A 07/16/2013    Procedure: TRANSESOPHAGEAL ECHOCARDIOGRAM (TEE);  Surgeon: Vesta Mixer, MD;  Location: [facility] ENDOSCOPY;  Service: Cardiovascular;  Laterality: N/A;    Subjective Symptoms/Limitations Symptoms: Jessica Myers states that she had a stroke September 25th. She had a pacemaker placed on 07/25/2013.   She lives with her husband; she states that the only deficit that she has noted is that at times she has difficulty with her voice.  She states at times her voice feels weak and she feels like there is a frog in her throat.   How long can you sit comfortably?: no problem How long can you stand comfortably?: no problem  How long can you walk comfortably?: no problem Pain Assessment Currently in Pain?: No/denies  Assessment RLE Assessment RLE Assessment: Within Functional Limits LLE Assessment LLE Assessment: Within Functional Limits  Exercise/Treatments Mobility/Balance  Static Standing Balance Single Leg Stance - Right Leg: 5 Single Leg Stance - Left Leg: 5      Physical Therapy Assessment and Plan PT Assessment and Plan Clinical Impression Statement: Pt is an 79 yo active female who experienced a CVA late September.  She recieved a pacemaker on 07/25/2013 and states the only deficit that she has noticed has been her voice, (speech therapy was consulted).  Jessica Myers was manual mm  tested with mm strength wnl.  She plays a string instrument and is able to play withou diffiiculty.  The pt did show some mild balance deficits.  She enjoys line dancing and was able to demonstrate line dancing to the therapist without loss of balance.  PT Plan: Pt seen for one time visit.  She was given a HEP for improving her balance but the deficit is not enough to qualify for skilled therapy.  The patient was referred to speech regaurding her concerns about her voice.    Goals Home Exercise Program Pt/caregiver will Perform Home Exercise Program: For improved balance PT Goal: Perform Home Exercise Program - Progress: Goal set today  Problem List Patient Active Problem List   Diagnosis Date Noted  . Syncope 07/25/2013  . Stroke 07/16/2013  . Acute right MCA stroke 07/16/2013  . Acute left hemiparesis 07/16/2013  . Accelerated hypertension 07/16/2013  . Dyslipidemia, goal LDL below 100 07/16/2013  . Dysarthria as late effect of stroke 07/16/2013  . Unspecified hypothyroidism 07/16/2013  . Encounter for long-term (current) use of medications 07/16/2013    PT Plan of Care PT Home Exercise Plan: given  GP Functional Limitation: Changing and maintaining body position Changing and Maintaining Body Position Current Status (U4403): At least 1 percent but less than 20 percent impaired, limited or restricted Changing and Maintaining Body Position Goal Status (K7425): At least 1 percent but less than 20 percent impaired, limited or restricted Changing and Maintaining  Body Position Discharge Status [phone]): At least 1 percent but less than 20 percent impaired, limited or restricted  Jessica Myers,Jessica Myers 08/15/2013, 1:47 PM  Physician Documentation Your signature is required to indicate approval of the treatment plan as stated above.  Please sign and either send electronically or make a copy of this report for your files and return this physician signed original.   Please mark one 1.__approve of  plan  2. ___approve of plan with the following conditions.   ______________________________                                                          _____________________ Physician Signature                                                                                                             Date

## 2013-08-15 NOTE — Telephone Encounter (Signed)
Eliquis approved through [employer] through 07/30/2014

## 2013-08-19 ENCOUNTER — Telehealth: Payer: Self-pay | Admitting: Internal Medicine

## 2013-08-19 NOTE — Telephone Encounter (Signed)
Reviewed instructions for activity level and incision care.  Reassured patient the pacemaker is indeed working since she has not had any further syncopal episodes.  Follow up as scheduled.

## 2013-08-19 NOTE — Telephone Encounter (Signed)
New problem    Pt has questions about her pace maker.  If the pacer maker stops how will she know?

## 2013-08-22 ENCOUNTER — Telehealth: Payer: Self-pay | Admitting: Internal Medicine

## 2013-08-22 NOTE — Telephone Encounter (Signed)
New problem     Pt called to ask how frequently she should do her report [employer] readings over the phone?    Pt did a transmition today around 9:25am 11/6

## 2013-08-22 NOTE — Telephone Encounter (Signed)
Spoke w/pt in regards to transmission schedule. Transmission was received for today. Pt aware no need to send transmission anymore until after GT appointment in January unless having problems.

## 2013-08-27 ENCOUNTER — Encounter: Payer: Self-pay | Admitting: Internal Medicine

## 2013-08-28 ENCOUNTER — Encounter: Payer: Self-pay | Admitting: Internal Medicine

## 2013-08-29 ENCOUNTER — Telehealth: Payer: Self-pay | Admitting: Internal Medicine

## 2013-08-29 NOTE — Telephone Encounter (Signed)
Spoke w/pt---transmission was received and will be showed on 11-17 to Dr Johney Frame. Pt concerned about having AF.

## 2013-08-29 NOTE — Telephone Encounter (Signed)
New message    Yesterday heart was "flipping" and "fluttering" all day.  Finally pt transmitted pacemaker remotely last night around 8:45.  Did we get it?

## 2013-09-04 ENCOUNTER — Telehealth: Payer: Self-pay | Admitting: Internal Medicine

## 2013-09-04 ENCOUNTER — Encounter: Payer: Medicare Other | Admitting: *Deleted

## 2013-09-04 NOTE — Telephone Encounter (Signed)
Transmission received, patient aware. 

## 2013-09-04 NOTE — Telephone Encounter (Signed)
Follow Up  Pt returned call// she states that she has sent in a signal///wanted to leave town and be sure that her pacemaker was working before she leaves... Requests a cal back with the results.

## 2013-09-04 NOTE — Telephone Encounter (Signed)
Left brief msg on VM. Transmission showing Afb 10.9% of time but pt on eliquis. 1 NSVT. Let her know she is fine to travel.

## 2013-09-04 NOTE — Telephone Encounter (Signed)
New Problem:  Pt is sending a remote transmission. Pt states she is going out of town and she wants to know that the transmission looks good before she goes out of town. Please call pt back with the results of her transmission.

## 2013-09-09 ENCOUNTER — Telehealth: Payer: Self-pay | Admitting: *Deleted

## 2013-09-09 NOTE — Telephone Encounter (Signed)
Called pt to schedule appt w/ Lawson Fiscal. Pt concerned Afib occuring more; she's concerned pacemaker not working correctly. I let her know her device can see a-fib but cannot fix/address that type of arrhythmia. Pt still taking eliquis & metoprolol.  ROV w/ Lawson Fiscal (while Dr. Johney Frame here) 09/25/13 @ 9:00 to discuss potential medicine changes.

## 2013-09-23 ENCOUNTER — Ambulatory Visit (INDEPENDENT_AMBULATORY_CARE_PROVIDER_SITE_OTHER): Payer: Medicare Other | Admitting: Nurse Practitioner

## 2013-09-23 ENCOUNTER — Encounter: Payer: Self-pay | Admitting: Nurse Practitioner

## 2013-09-23 VITALS — BP 150/83 | HR 90 | Temp 96.8°F | Ht 63.0 in | Wt 141.0 lb

## 2013-09-23 DIAGNOSIS — I4891 Unspecified atrial fibrillation: Secondary | ICD-10-CM

## 2013-09-23 DIAGNOSIS — I6350 Cerebral infarction due to unspecified occlusion or stenosis of unspecified cerebral artery: Secondary | ICD-10-CM

## 2013-09-23 DIAGNOSIS — R531 Weakness: Secondary | ICD-10-CM

## 2013-09-23 DIAGNOSIS — M6281 Muscle weakness (generalized): Secondary | ICD-10-CM

## 2013-09-23 DIAGNOSIS — I639 Cerebral infarction, unspecified: Secondary | ICD-10-CM

## 2013-09-23 NOTE — Progress Notes (Signed)
PATIENT: Jessica Myers DOB: 08-03-1929   REASON FOR VISIT: post-hospital follow up for stroke HISTORY FROM: patient  HISTORY OF PRESENT ILLNESS: Jessica Myers is an 79 y.o. female, right handed, with a past medical history significant for HTN, hyperlipidemia, GERD, brought to [facility] ED by medics as a code stroke due to acute onset left hemiparesis, left face weakness, and dysarthria.  She was last known well at 1850 pm on 07/11/13, doing some work at her computer, when her husband noticed that she was having slurred speech, weakness of the left face, and inability to use the left side of her body. Ambulance called and upon initial evaluation in the ED she had NIHSS 16. Of importance, family report that couple of weeks ago she had transient slurred speech and had a brain MRI that was normal   CT brain revealed no acute intracranial abnormality. MRI of the brain showed acute right MCA lenticulostriate territory infarct. Slight central hemorrhage.  MRA of the brain showed intracranial atherosclerotic changes as described. No significant  M1 stenosis on the right, but moderately severe right M2 disease is identified; These lesions are distal to the observed pattern of acute infarction.  2D Echocardiogram EF 60%, wall motion normal, No ASD or PFO identified.  Carotid Doppler with bilateral 1-39% ICA stenosis.  She received thrombolytic therapy with neurologic recovery. Subsequent workup demonstrated no obvious etiology of her stroke. She underwent insertion of an implantable loop recorder. She was in her usual state of health until a few days after discharge when she fell to the ground. She was not sure if she actually passed out or not. She did not injure herself. She reports a remote history of prior episodes similar to this. In addition, the patient has experienced palpitations on and off throughout her life. Since her stroke, she has very little neurologic impairment and has returned to her daily  activities. She denies chest pain or shortness of breath.  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 She had loop recorder removed and dual chamber pacemaker implanted.  She was started on Eloquis, with no signs of bleeding or serious bruising.  REVIEW OF SYSTEMS: Full 14 system review of systems performed and notable only for:  Cardiovascular: palpitations, murmur  Sleep: restless legs   ALLERGIES: Allergies  Allergen Reactions  . Biaxin [Clarithromycin]   . Celebrex [Celecoxib]   . Penicillins     Hives   . Shellfish Allergy     hives    HOME MEDICATIONS: Outpatient Prescriptions Prior to Visit  Medication Sig Dispense Refill  . ALPRAZolam (XANAX) 0.5 MG tablet Take 0.125 mg by mouth daily as needed for sleep or anxiety.       Marland Kitchen apixaban (ELIQUIS) 2.5 MG TABS tablet Take 1 tablet (2.5 mg total) by mouth 2 (two) times daily. START on Monday, 07/29/2013  60 tablet  4  . atorvastatin (LIPITOR) 10 MG tablet Take 10 mg by mouth daily.      . cholecalciferol (VITAMIN D) 1000 UNITS tablet Take 2,000 Units by mouth daily.      Marland Kitchen levothyroxine (SYNTHROID, LEVOTHROID) 100 MCG tablet Take 100 mcg by mouth daily.      Marland Kitchen lisinopril (PRINIVIL,ZESTRIL) 10 MG tablet Take 10 mg by mouth 2 (two) times daily.      . meclizine (ANTIVERT) 25 MG tablet Take 25 mg by mouth 3 (three) times daily as needed for dizziness.      Marland Kitchen  metoprolol succinate (TOPROL-XL) 25 MG 24 hr tablet Take 25 mg by mouth 2 (two) times daily.      Marland Kitchen omeprazole (PRILOSEC) 20 MG capsule Take 20 mg by mouth daily.       No facility-administered medications prior to visit.    PAST MEDICAL HISTORY: Past Medical History  Diagnosis Date  . Hypertension   . Vertical vertigo   . Thyroid disease   . High cholesterol   . Anxiety   . Headache(784.0)     PAST SURGICAL HISTORY: Past Surgical History  Procedure Laterality Date  . Cholecystectomy    . Tee without cardioversion N/A  07/16/2013    Procedure: TRANSESOPHAGEAL ECHOCARDIOGRAM (TEE);  Surgeon: Vesta Mixer, MD;  Location: [facility] ENDOSCOPY;  Service: Cardiovascular;  Laterality: N/A;    FAMILY HISTORY: No family history on file.  SOCIAL HISTORY: History   Social History  . Marital Status: Married    Spouse Name: Jessica Myers    Number of Children: 2  . Years of Education: 12   Occupational History  . retired    Social History Main Topics  . Smoking status: Never Smoker   . Smokeless tobacco: Never Used  . Alcohol Use: No  . Drug Use: No  . Sexual Activity: No   Other Topics Concern  . Not on file   Social History Narrative  . No narrative on file   PHYSICAL EXAM  Filed Vitals:   09/23/13 1431  BP: 150/83  Pulse: 90  Temp: 96.8 F (36 C)  TempSrc: Oral  Height: 5\' 3"  (1.6 m)  Weight: 141 lb (63.957 kg)   Body mass index is 24.98 kg/(m^2).  Generalized: Well developed, in no acute distress  Head: normocephalic and atraumatic. Oropharynx benign  Neck: Supple, no carotid bruits  Cardiac: Regular rate rhythm, no murmur  Musculoskeletal: No deformity   Neurological examination  Mentation: Alert oriented to time, place, history taking. Follows all commands speech and language fluent Cranial nerve II-XII: Pupils were equal round reactive to light extraocular movements were full, visual field were full on confrontational test. Facial sensation and strength were normal. hearing was intact to finger rubbing bilaterally. Uvula tongue midline. head turning and shoulder shrug and were normal and symmetric.Tongue protrusion into cheek strength was normal. Motor: normal bulk and tone, full strength in the BUE, BLE, fine finger movements decreased on left, no pronator drift. No focal weakness, right orbits left hand. Sensory: normal and symmetric to light touch, pinprick  Coordination: finger-nose-finger, heel-to-shin bilaterally, no dysmetria Reflexes:  Deep tendon reflexes in the upper and lower  extremities are present and symmetric.  Gait and Station: Rising up from seated position without assistance, normal stance, without trunk ataxia, moderate stride, good arm swing, smooth turning, able to perform tiptoe, and heel walking without difficulty.   DIAGNOSTIC DATA (LABS, IMAGING, TESTING) - I reviewed patient records, labs, notes, testing and imaging myself where available.  Lab Results  Component Value Date   WBC 6.0 07/25/2013   HGB 12.2 07/25/2013   HCT 36.8 07/25/2013   MCV 91.5 07/25/2013   PLT 242 07/25/2013      Component Value Date/Time   NA 141 07/25/2013 1330   K 4.5 07/25/2013 1330   CL 105 07/25/2013 1330   CO2 26 07/25/2013 1330   GLUCOSE 96 07/25/2013 1330   BUN 30* 07/25/2013 1330   CREATININE 1.28* 07/25/2013 1330   CALCIUM 9.4 07/25/2013 1330   PROT 6.3 07/11/2013 1948   ALBUMIN 3.4* 07/11/2013 1948  AST 15 07/11/2013 1948   ALT 13 07/11/2013 1948   ALKPHOS 67 07/11/2013 1948   BILITOT 0.7 07/11/2013 1948   GFRNONAA 38* 07/25/2013 1330   GFRAA 44* 07/25/2013 1330   Lab Results  Component Value Date   CHOL 136 07/12/2013   HDL 55 07/12/2013   LDLCALC 67 07/12/2013   TRIG 71 07/12/2013   CHOLHDL 2.5 07/12/2013   Lab Results  Component Value Date   HGBA1C 5.2 07/12/2013   Lab Results  Component Value Date   TSH 1.173 07/25/2013    ASSESSMENT AND PLAN Ms. AVEYAH GREENWOOD is a 79 y.o. female presented with left hemiparesis, dysarthria on 06/10/13. Status post IV t-PA full dose on 07/11/2013. Imaging does show an acute right subcortical infarct with right MCA trifurcation stenosis/clot. Infarct felt to be embolic secondary to unknown source. Loop recorder was implanted and revealed tachybrady syndrome with sinus arrest and pause > 4 sec only a few days later.  The loop recorder was removed and a dual chamber pacemaker was placed; she was started on Eloquis.  She has recovered well with only very mild left hemiparesis and dysarthria when tired.  Continue Eloquis for atrial  fibrillation and as secondary stroke prevention.  Maintain strict control of hypertension with blood pressure goal below 130/90, diabetes with hemoglobin A1c goal below 6.5% and lipids with LDL cholesterol goal below 100 mg/dL.  Followup in the future with me in 3-4 months. Patient was referred for the [facility] Pharmacogenetic Testing study.  Ronal Fear, MSN, NP-C 09/23/2013, 3:11 PM [facility] [address] [place], [place] [address] [phone]  Note: This document was prepared with digital dictation and possible smart phrase technology. Any transcriptional errors that result from this process are unintentional.

## 2013-09-23 NOTE — Patient Instructions (Signed)
Continue Eloquis for atrial fibrillation and as secondary stroke prevention.  Maintain strict control of hypertension with blood pressure goal below 130/90, diabetes with hemoglobin A1c goal below 6.5% and lipids with LDL cholesterol goal below 100 mg/dL. Followup in the future with me in 3-4 months.

## 2013-09-25 ENCOUNTER — Encounter: Payer: Self-pay | Admitting: Nurse Practitioner

## 2013-09-25 ENCOUNTER — Ambulatory Visit (INDEPENDENT_AMBULATORY_CARE_PROVIDER_SITE_OTHER): Payer: Medicare Other | Admitting: Nurse Practitioner

## 2013-09-25 VITALS — BP 140/80 | HR 80

## 2013-09-25 DIAGNOSIS — I4891 Unspecified atrial fibrillation: Secondary | ICD-10-CM

## 2013-09-25 DIAGNOSIS — I480 Paroxysmal atrial fibrillation: Secondary | ICD-10-CM

## 2013-09-25 LAB — CBC WITH DIFFERENTIAL/PLATELET
Basophils Absolute: 0 10*3/uL (ref 0.0–0.1)
Basophils Relative: 0.5 % (ref 0.0–3.0)
Eosinophils Absolute: 0.2 10*3/uL (ref 0.0–0.7)
Eosinophils Relative: 3.9 % (ref 0.0–5.0)
HCT: 35.5 % — ABNORMAL LOW (ref 36.0–46.0)
Hemoglobin: 11.8 g/dL — ABNORMAL LOW (ref 12.0–15.0)
Lymphocytes Relative: 24.3 % (ref 12.0–46.0)
Lymphs Abs: 1.3 10*3/uL (ref 0.7–4.0)
MCHC: 33.3 g/dL (ref 30.0–36.0)
MCV: 88.8 fl (ref 78.0–100.0)
Monocytes Absolute: 0.4 10*3/uL (ref 0.1–1.0)
Monocytes Relative: 8.6 % (ref 3.0–12.0)
Neutro Abs: 3.2 10*3/uL (ref 1.4–7.7)
Neutrophils Relative %: 62.7 % (ref 43.0–77.0)
Platelets: 175 10*3/uL (ref 150.0–400.0)
RBC: 4 Mil/uL (ref 3.87–5.11)
RDW: 13 % (ref 11.5–14.6)
WBC: 5.2 10*3/uL (ref 4.5–10.5)

## 2013-09-25 LAB — BASIC METABOLIC PANEL
BUN: 17 mg/dL (ref 6–23)
CO2: 30 mEq/L (ref 19–32)
Calcium: 9.5 mg/dL (ref 8.4–10.5)
Chloride: 106 mEq/L (ref 96–112)
Creatinine, Ser: 1.2 mg/dL (ref 0.4–1.2)
GFR: 46.55 mL/min — ABNORMAL LOW (ref >60.00)
Glucose, Bld: 76 mg/dL (ref 70–99)
Potassium: 4.4 mEq/L (ref 3.5–5.1)
Sodium: 142 mEq/L (ref 135–145)

## 2013-09-25 MED ORDER — METOPROLOL SUCCINATE ER 25 MG PO TB24: 25.0000 mg | ORAL_TABLET | Freq: Three times a day (TID) | ORAL | Status: DC

## 2013-09-25 NOTE — Progress Notes (Signed)
Jessica Myers Date of Birth: Oct 08, 1929 Medical Record #[id number]  History of Present Illness: Ms. Douse is seen back today for a work in visit. Seen for Dr. Johney Frame but she is actually Dr. Lubertha Basque patient. She has had a recent stroke back in September and received thrombolytic therapy with neurologic recovery. She did have a loop recorder implanted after no obvious etiology for her stroke - this has shown symptomatic bradycardia and PPM was implanted. Also noted to have PAF. Now on anticoagulation and beta blocker therapy. Other issues include HTN, HLD, anxiety, CKD and vertigo.  Called before Thanksgiving with complaints of atrial fib - concerned her pacemaker was not working properly. Thus added to my schedule for today.   Comes in today. Here with her husband. She notes an occasional "quiver" in her chest. No racing. Not dizzy and no passing out. No chest pain. Gets very frightened by her atrial fib - worried that she will have another stroke. Remains on her Eliquis - no missed doses. Not back to her regular activities due to fear.   Current Outpatient Prescriptions  Medication Sig Dispense Refill  . ALPRAZolam (XANAX) 0.5 MG tablet Take 0.125 mg by mouth daily as needed for sleep or anxiety.       Marland Kitchen apixaban (ELIQUIS) 2.5 MG TABS tablet Take 1 tablet (2.5 mg total) by mouth 2 (two) times daily. START on Monday, 07/29/2013  60 tablet  4  . atorvastatin (LIPITOR) 10 MG tablet Take 10 mg by mouth daily.      . cholecalciferol (VITAMIN D) 1000 UNITS tablet Take 2,000 Units by mouth daily.      Marland Kitchen levothyroxine (SYNTHROID, LEVOTHROID) 100 MCG tablet Take 100 mcg by mouth daily.      Marland Kitchen lisinopril (PRINIVIL,ZESTRIL) 10 MG tablet Take 10 mg by mouth 2 (two) times daily.      . meclizine (ANTIVERT) 25 MG tablet Take 25 mg by mouth 3 (three) times daily as needed for dizziness.      . metoprolol succinate (TOPROL-XL) 25 MG 24 hr tablet Take 25 mg by mouth 2 (two) times daily.      Marland Kitchen omeprazole  (PRILOSEC) 20 MG capsule Take 20 mg by mouth daily.       No current facility-administered medications for this visit.    Allergies  Allergen Reactions  . Biaxin [Clarithromycin]   . Celebrex [Celecoxib]   . Penicillins     Hives   . Shellfish Allergy     hives    Past Medical History  Diagnosis Date  . Hypertension   . Vertical vertigo   . Thyroid disease   . High cholesterol   . Anxiety   . [id number](478.2)     Past Surgical History  Procedure Laterality Date  . Cholecystectomy    . Tee without cardioversion N/A 07/16/2013    Procedure: TRANSESOPHAGEAL ECHOCARDIOGRAM (TEE);  Surgeon: Vesta Mixer, MD;  Location: [facility] ENDOSCOPY;  Service: Cardiovascular;  Laterality: N/A;    History  Smoking status  . Never Smoker   Smokeless tobacco  . Never Used    History  Alcohol Use No    No family history on file.  Review of Systems: The review of systems is per the HPI.  All other systems were reviewed and are negative.  Physical Exam: There were no vitals taken for this visit. Patient is very pleasant and in no acute distress. Skin is warm and dry. Color is normal.  HEENT is unremarkable. Normocephalic/atraumatic. PERRL. Sclera  are nonicteric. Neck is supple. No masses. No JVD. Lungs are clear. Cardiac exam shows a regular rate and rhythm. Abdomen is soft. Extremities are without edema. Gait and ROM are intact. No gross neurologic deficits noted.  LABORATORY DATA: PENDING  Lab Results  Component Value Date   WBC 6.0 07/25/2013   HGB 12.2 07/25/2013   HCT 36.8 07/25/2013   PLT 242 07/25/2013   GLUCOSE 96 07/25/2013   CHOL 136 07/12/2013   TRIG 71 07/12/2013   HDL 55 07/12/2013   LDLCALC 67 07/12/2013   ALT 13 07/11/2013   AST 15 07/11/2013   NA 141 07/25/2013   K 4.5 07/25/2013   CL 105 07/25/2013   CREATININE 1.28* 07/25/2013   BUN 30* 07/25/2013   CO2 26 07/25/2013   TSH 1.173 07/25/2013   INR 0.97 07/11/2013   HGBA1C 5.2 07/12/2013   Echo Study Conclusions  - Left  ventricle: Systolic function was normal. The estimated ejection fraction was in the range of 55% to 60%. - Aortic valve: No evidence of vegetation. Mild to moderate regurgitation. - Mitral valve: No evidence of vegetation. Mild to moderate regurgitation. - Left atrium: No evidence of thrombus in the atrial cavity or appendage. No evidence of thrombus in the atrial cavity or appendage. - Pulmonic valve: No evidence of vegetation.  Assessment / Plan: 1. PAF - her pacemaker was checked today - she has had 8.5% burden with 277 mode switches - most episodes of very short duration - she would like to try and avoid antiarrhythmics - will try to increase her beta blocker over the next month and see if we can smooth her rhythm out more. She is on Eliquis.   2. Chronic anticoagulation with Eliquis - recheck her labs today - no problems noted.   3. HTN  4. Prior stroke  Will see her back as planned next month. Toprol is increased. Check labs today as well. She is given the ok to get back to her regular activities.   Patient is agreeable to this plan and will call if any problems develop in the interim.   Rosalio Macadamia, RN, ANP-C [facility] [address] [place], [place]  [address]

## 2013-09-25 NOTE — Patient Instructions (Signed)
We will check labs today  Increase the Metoprolol to three times a day  Ok to increase your activity  See Dr. Ladona Ridgel back in January  Call the [facility] office at [phone] if you have any questions, problems or concerns.

## 2013-10-24 ENCOUNTER — Ambulatory Visit (INDEPENDENT_AMBULATORY_CARE_PROVIDER_SITE_OTHER): Payer: Medicare Other | Admitting: Internal Medicine

## 2013-10-24 ENCOUNTER — Encounter: Payer: Self-pay | Admitting: Internal Medicine

## 2013-10-24 VITALS — BP 145/71 | HR 84 | Ht 64.5 in | Wt 143.0 lb

## 2013-10-24 DIAGNOSIS — I639 Cerebral infarction, unspecified: Secondary | ICD-10-CM

## 2013-10-24 DIAGNOSIS — I6350 Cerebral infarction due to unspecified occlusion or stenosis of unspecified cerebral artery: Secondary | ICD-10-CM

## 2013-10-24 DIAGNOSIS — R55 Syncope and collapse: Secondary | ICD-10-CM

## 2013-10-24 DIAGNOSIS — I4891 Unspecified atrial fibrillation: Secondary | ICD-10-CM

## 2013-10-24 DIAGNOSIS — I10 Essential (primary) hypertension: Secondary | ICD-10-CM

## 2013-10-24 DIAGNOSIS — Z950 Presence of cardiac pacemaker: Secondary | ICD-10-CM

## 2013-10-24 LAB — MDC_IDC_ENUM_SESS_TYPE_INCLINIC
Battery Impedance: 100 Ohm
Brady Statistic AP VP Percent: 1 %
Brady Statistic AP VS Percent: 77 %
Brady Statistic AS VP Percent: 0 %
Brady Statistic AS VS Percent: 22 %
Date Time Interrogation Session: 20150108100953
Lead Channel Impedance Value: 570 Ohm
Lead Channel Impedance Value: 808 Ohm
Lead Channel Pacing Threshold Amplitude: 0.5 V
Lead Channel Pacing Threshold Pulse Width: 0.4 ms
Lead Channel Pacing Threshold Pulse Width: 0.4 ms
Lead Channel Sensing Intrinsic Amplitude: 5 mV
Lead Channel Setting Pacing Pulse Width: 0.4 ms
Lead Channel Setting Sensing Sensitivity: 5.6 mV
MDC IDC MSMT BATTERY REMAINING LONGEVITY: 149 mo
MDC IDC MSMT BATTERY VOLTAGE: 2.79 V
MDC IDC MSMT LEADCHNL RV PACING THRESHOLD AMPLITUDE: 0.5 V
MDC IDC MSMT LEADCHNL RV SENSING INTR AMPL: 11.2 mV
MDC IDC SET LEADCHNL RA PACING AMPLITUDE: 2 V
MDC IDC SET LEADCHNL RV PACING AMPLITUDE: 2.5 V

## 2013-10-24 NOTE — Assessment & Plan Note (Signed)
The etiology of her stroke was found to be related to atrial fibrillation. She will be maintained on systemic anticoagulation.

## 2013-10-24 NOTE — Assessment & Plan Note (Signed)
Her blood pressure today is only slightly elevated. She will continue her current medical therapy. I've encouraged the patient to reduce her sodium intake.

## 2013-10-24 NOTE — Patient Instructions (Addendum)
Your physician recommends that you schedule a follow-up appointment in: October 2015 You will receive a reminder letter two months in advance reminding you to call and schedule your appointment. If you don't receive this letter, please contact our office.  [facility] 01-27-14

## 2013-10-24 NOTE — Progress Notes (Signed)
Patient ID: Jessica Myers, female   DOB: 08-08-1929, 80 y.o.   MRN: [id number] Jessica Myers is an 80 y.o. female.   Chief Complaint: syncope HPI: The patient is a very pleasant 80 year old woman who was admitted to the hospital several weeks ago with a stroke. She received thrombolytic therapy with neurologic recovery. Subsequent workup demonstrated no obvious etiology of her stroke. She underwent insertion of an implantable loop recorder. She then experienced a syncopal episode and was found on her loop recorder to have a pause of over 6 seconds. She underwent permanent pacemaker insertion by Dr. Caryl Comes. Since then she is improved but notes that she is still not quite back to normal. She does have palpitations and some anxiety. When she gets up quickly, she gets dizzy. She denies chest pain or shortness of breath. She occasionally will take a benzodiazepine tablet for anxiety.  Past Medical History  Diagnosis Date  . Hypertension   . Vertical vertigo   . Thyroid disease   . High cholesterol   . Anxiety   . Headache(784.0)   . Stroke September 2014    thrombolytic therapy  . Bradycardia     s/p PPM October 2014  . Chronic anticoagulation     Past Surgical History  Procedure Laterality Date  . Cholecystectomy    . Tee without cardioversion N/A 07/16/2013    Procedure: TRANSESOPHAGEAL ECHOCARDIOGRAM (TEE);  Surgeon: Thayer Headings, MD;  Location: [place];  Service: Cardiovascular;  Laterality: N/A;  . Pacemaker insertion      History reviewed. No pertinent family history. Social History:  reports that she has never smoked. She has never used smokeless tobacco. She reports that she does not drink alcohol or use illicit drugs.  Allergies:  Allergies  Allergen Reactions  . Biaxin [Clarithromycin]   . Celebrex [Celecoxib]   . Penicillins     Hives   . Shellfish Allergy     hives     (Not in a hospital admission)  No results found for this or any previous visit (from the  past 48 hour(s)). No results found.  ROS - all systems reviewed and negative except as noted in the history of present illness.  Physical Exam  Blood pressure 145/71, pulse 58, respirations 18 Well appearing 80 year old woman, NAD HEENT: Unremarkable Neck:  No JVD, no thyromegally Back:  No CVA tenderness Lungs:  Clear with no wheezes, rales, or rhonchi. Well-healed pacemaker incision HEART:  Regular rate rhythm, no murmurs, no rubs, no clicks Abd:  Flat, soft, positive bowel sounds, no organomegally, no rebound, no guarding Ext:  2 plus pulses, no edema, no cyanosis, no clubbing Skin:  No rashes no nodules Neuro:  CN II through XII intact, motor grossly intact   Assessment/Plan  Cristopher Peru, M.D.  Cristopher Peru 10/24/2013, 12:17 PM

## 2013-10-24 NOTE — Assessment & Plan Note (Signed)
She appears to be maintaining sinus rhythm 92% of the time. She'll continue her current medical therapy. If her atrial fibrillation increases in frequency, severity, or duration, we would consider antiarrhythmic drug therapy.

## 2013-10-24 NOTE — Assessment & Plan Note (Signed)
She is status post insertion of a Medtronic dual-chamber pacemaker and her device is working normally today. We'll plan to recheck in several months.

## 2013-10-31 ENCOUNTER — Encounter: Payer: Self-pay | Admitting: Internal Medicine

## 2013-11-18 ENCOUNTER — Other Ambulatory Visit: Payer: Self-pay | Admitting: Cardiology

## 2013-11-19 ENCOUNTER — Telehealth: Payer: Self-pay | Admitting: Internal Medicine

## 2013-11-19 NOTE — Telephone Encounter (Signed)
Discussed with Dr Lovena Le, ok to get the shingles shot.  Called patient and left message

## 2013-11-19 NOTE — Telephone Encounter (Signed)
New message   Patient wants to know can she get the shingles shot.

## 2013-12-13 ENCOUNTER — Other Ambulatory Visit: Payer: Self-pay | Admitting: Cardiology

## 2013-12-17 ENCOUNTER — Telehealth: Payer: Self-pay | Admitting: *Deleted

## 2013-12-17 ENCOUNTER — Other Ambulatory Visit: Payer: Self-pay | Admitting: Cardiology

## 2013-12-17 NOTE — Telephone Encounter (Signed)
Patient request PA to [facility] for eliquis, also sent a tier exception form in at patients request, she states a tier exception was approved for her 2014.

## 2013-12-30 NOTE — Telephone Encounter (Signed)
Received from [facility] a quantity limit request for eliquis approval. We have requested a tier exception but no notice on that.

## 2014-01-03 ENCOUNTER — Encounter (INDEPENDENT_AMBULATORY_CARE_PROVIDER_SITE_OTHER): Payer: Medicare Other | Admitting: Adult Health

## 2014-01-03 ENCOUNTER — Ambulatory Visit (HOSPITAL_COMMUNITY)
Admission: RE | Admit: 2014-01-03 | Discharge: 2014-01-03 | Disposition: A | Discharge status: Home / Self Care | Payer: Medicare Other | Source: Ambulatory Visit | Attending: Adult Health | Admitting: Adult Health

## 2014-01-03 ENCOUNTER — Encounter: Payer: Self-pay | Admitting: Adult Health

## 2014-01-03 VITALS — BP 134/68 | HR 85 | Ht 64.0 in | Wt 141.0 lb

## 2014-01-03 DIAGNOSIS — Z950 Presence of cardiac pacemaker: Secondary | ICD-10-CM

## 2014-01-03 DIAGNOSIS — M542 Cervicalgia: Secondary | ICD-10-CM

## 2014-01-03 DIAGNOSIS — M25519 Pain in unspecified shoulder: Secondary | ICD-10-CM | POA: Insufficient documentation

## 2014-01-03 DIAGNOSIS — R490 Dysphonia: Secondary | ICD-10-CM | POA: Insufficient documentation

## 2014-01-03 DIAGNOSIS — M79603 Pain in arm, unspecified: Secondary | ICD-10-CM

## 2014-01-03 DIAGNOSIS — I4891 Unspecified atrial fibrillation: Secondary | ICD-10-CM

## 2014-01-03 DIAGNOSIS — M5380 Other specified dorsopathies, site unspecified: Secondary | ICD-10-CM | POA: Insufficient documentation

## 2014-01-03 MED ORDER — LORATADINE 10 MG PO TABS: 10.0000 mg | ORAL_TABLET | Freq: Every day | ORAL | Status: DC

## 2014-01-03 NOTE — Assessment & Plan Note (Signed)
I do not think this is related to TEE. Quite possibly from CVA, but she also has a lot of post nasal gtt. I will start her on OTC loratadine 10 mg daily. She is to follow up with Dr.Shah for ongoing management.

## 2014-01-03 NOTE — Progress Notes (Deleted)
Name: Jessica Myers    DOB: Jun 26, 1929  Age: 80 y.o.  MR#: [id number]       PCP:  Monico Blitz, MD      Insurance: Payor: [place] / Plan: BLUE MEDICARE / Product Type: *No Product type* /   CC:   No chief complaint on file.   VS Filed Vitals:   01/03/14 1546  BP: 134/68  Pulse: 85  Height: 5\' 4"  (1.626 m)  Weight: 141 lb (63.957 kg)  SpO2: 98%    Weights Current Weight  01/03/14 141 lb (63.957 kg)  10/24/13 143 lb (64.864 kg)  09/23/13 141 lb (63.957 kg)    Blood Pressure  BP Readings from Last 3 Encounters:  01/03/14 134/68  10/24/13 145/71  09/25/13 140/80     Admit date:  (Not on file) Last encounter with RMR:  Visit date not found   Allergy Biaxin; Celebrex; Penicillins; and Shellfish allergy  Current Outpatient Prescriptions  Medication Sig Dispense Refill  . ALPRAZolam (XANAX) 0.5 MG tablet Take 0.125 mg by mouth daily as needed for sleep or anxiety.       [facility] Kitchen atorvastatin (LIPITOR) 10 MG tablet Take 10 mg by mouth daily.      . cholecalciferol (VITAMIN D) 1000 UNITS tablet Take 2,000 Units by mouth daily.      [facility] Kitchen ELIQUIS 2.5 MG TABS tablet TAKE 1 TABLET TWICE A DAY - START ON MONDAY 07/29/2013  60 tablet  4  . levothyroxine (SYNTHROID, LEVOTHROID) 100 MCG tablet Take 100 mcg by mouth daily.      [place] Kitchen lisinopril (PRINIVIL,ZESTRIL) 10 MG tablet Take 10 mg by mouth 2 (two) times daily.      . meclizine (ANTIVERT) 25 MG tablet Take 25 mg by mouth 3 (three) times daily as needed for dizziness.      . metoprolol succinate (TOPROL-XL) 25 MG 24 hr tablet Take 1 tablet (25 mg total) by mouth 3 (three) times daily.  90 tablet  6  . omeprazole (PRILOSEC) 20 MG capsule Take 20 mg by mouth daily.       No current facility-administered medications for this visit.    Discontinued Meds:    Medications Discontinued During This Encounter  Medication Reason  . ZOSTAVAX 60454 UNT/0.65ML injection Error    Patient Active Problem List   Diagnosis Date  Noted  . Pacemaker 10/24/2013  . Atrial fibrillation 10/24/2013  . Syncope 07/25/2013  . Stroke 07/16/2013  . Acute right MCA stroke 07/16/2013  . Acute left hemiparesis 07/16/2013  . Accelerated hypertension 07/16/2013  . Dyslipidemia, goal LDL below 100 07/16/2013  . Dysarthria as late effect of stroke 07/16/2013  . Unspecified hypothyroidism 07/16/2013  . Encounter for long-term (current) use of medications 07/16/2013    LABS    Component Value Date/Time   NA 142 09/25/2013 1004   NA 141 07/25/2013 1330   NA 142 07/14/2013 0546   K 4.4 09/25/2013 1004   K 4.5 07/25/2013 1330   K 3.7 07/14/2013 0546   CL 106 09/25/2013 1004   CL 105 07/25/2013 1330   CL 108 07/14/2013 0546   CO2 30 09/25/2013 1004   CO2 26 07/25/2013 1330   CO2 27 07/14/2013 0546   GLUCOSE 76 09/25/2013 1004   GLUCOSE 96 07/25/2013 1330   GLUCOSE 85 07/14/2013 0546   BUN 17 09/25/2013 1004   BUN 30* 07/25/2013 1330   BUN 14 07/14/2013 0546   CREATININE 1.2 09/25/2013 1004   CREATININE  1.28* 07/25/2013 1330   CREATININE 1.29* 07/14/2013 0546   CALCIUM 9.5 09/25/2013 1004   CALCIUM 9.4 07/25/2013 1330   CALCIUM 8.8 07/14/2013 0546   GFRNONAA 38* 07/25/2013 1330   GFRNONAA 37* 07/14/2013 0546   GFRNONAA 46* 07/11/2013 1948   GFRAA 44* 07/25/2013 1330   GFRAA 43* 07/14/2013 0546   GFRAA 53* 07/11/2013 1948   CMP     Component Value Date/Time   NA 142 09/25/2013 1004   K 4.4 09/25/2013 1004   CL 106 09/25/2013 1004   CO2 30 09/25/2013 1004   GLUCOSE 76 09/25/2013 1004   BUN 17 09/25/2013 1004   CREATININE 1.2 09/25/2013 1004   CALCIUM 9.5 09/25/2013 1004   PROT 6.3 07/11/2013 1948   ALBUMIN 3.4* 07/11/2013 1948   AST 15 07/11/2013 1948   ALT 13 07/11/2013 1948   ALKPHOS 67 07/11/2013 1948   BILITOT 0.7 07/11/2013 1948   GFRNONAA 38* 07/25/2013 1330   GFRAA 44* 07/25/2013 1330       Component Value Date/Time   WBC 5.2 09/25/2013 1004   WBC 6.0 07/25/2013 1330   WBC 6.1 07/14/2013 0546   HGB 11.8* 09/25/2013 1004    HGB 12.2 07/25/2013 1330   HGB 10.3* 07/14/2013 0546   HCT 35.5* 09/25/2013 1004   HCT 36.8 07/25/2013 1330   HCT 31.1* 07/14/2013 0546   MCV 88.8 09/25/2013 1004   MCV 91.5 07/25/2013 1330   MCV 91.2 07/14/2013 0546    Lipid Panel     Component Value Date/Time   CHOL 136 07/12/2013 0640   TRIG 71 07/12/2013 0640   HDL 55 07/12/2013 0640   CHOLHDL 2.5 07/12/2013 0640   VLDL 14 07/12/2013 0640   LDLCALC 67 07/12/2013 0640    ABG    Component Value Date/Time   TCO2 25 07/11/2013 1958     Lab Results  Component Value Date   TSH 1.173 07/25/2013   BNP (last 3 results) No results found for this basename: PROBNP,  in the last 8760 hours Cardiac Panel (last 3 results) No results found for this basename: CKTOTAL, CKMB, TROPONINI, RELINDX,  in the last 72 hours  Iron/TIBC/Ferritin No results found for this basename: iron, tibc, ferritin     EKG Orders placed during the hospital encounter of 07/25/13  . EKG 12-LEAD  . EKG 12-LEAD  . EKG 12-LEAD  . EKG 12-LEAD  . EKG 12-LEAD  . EKG 12-LEAD  . EKG     Prior Assessment and Plan Problem List as of 01/03/2014     Cardiovascular and Mediastinum   Stroke   Last Assessment & Plan   10/24/2013 Office Visit Written 10/24/2013  5:57 PM by Evans Lance, MD     The etiology of her stroke was found to be related to atrial fibrillation. She will be maintained on systemic anticoagulation.    Acute right MCA stroke   Accelerated hypertension   Last Assessment & Plan   10/24/2013 Office Visit Written 10/24/2013  5:58 PM by Evans Lance, MD     Her blood pressure today is only slightly elevated. She will continue her current medical therapy. I've encouraged the patient to reduce her sodium intake.    Syncope   Atrial fibrillation   Last Assessment & Plan   10/24/2013 Office Visit Written 10/24/2013  5:59 PM by Evans Lance, MD     She appears to be maintaining sinus rhythm 92% of the time. She'll continue her current medical therapy. If  her  atrial fibrillation increases in frequency, severity, or duration, we would consider antiarrhythmic drug therapy.      Endocrine   Unspecified hypothyroidism     Nervous and Auditory   Acute left hemiparesis     Other   Dyslipidemia, goal LDL below 100   Dysarthria as late effect of stroke   Encounter for long-term (current) use of medications   Pacemaker   Last Assessment & Plan   10/24/2013 Office Visit Written 10/24/2013  5:57 PM by Evans Lance, MD     She is status post insertion of a Medtronic dual-chamber pacemaker and her device is working normally today. We'll plan to recheck in several months.        Imaging: No results found.

## 2014-01-03 NOTE — Assessment & Plan Note (Signed)
Heart rate is well controlled with metoprolol. She continues on Eliquis. She is anxious that she will have another CVA. She is given reassurance. I have spent over 25 minutes with this patient.

## 2014-01-03 NOTE — Patient Instructions (Signed)
Your physician recommends that you schedule a follow-up appointment in: with Dr Lovena Le  Your physician recommends you get a left shoulder xray and cervical spine xray.  Your physician has recommended you make the following change in your medication:  Take claritin 10 mg daily Take Tylenol every 6 hours as needed  Please follow up with Dr Brigitte Pulse.

## 2014-01-03 NOTE — Progress Notes (Signed)
HPI:  Jessica Myers is a 80 y/o patient of Dr. Lovena Le who is seen as add-on for complaints of shoulder pain, throat pain, and swelling in her knees. She has a history of sinus arrest per loop recorder, with implantation of Medtronic Dual Chamber pacemaker in Sept of 2015. She also has a history of atrial fib, and CVA.   She comes today with multiple somatic complaints that she believes are related to her pacemaker insertion. She complains of left shoulder pain, that worsens when she lifts her arm or moves it across her chest. She also complains of hoarseness in her throat which she believes is related to her TEE. She has complaints of growths under the skin of both knees.    She wishes to have her pacemaker interrogated as well.  Allergies  Allergen Reactions  . Biaxin [Clarithromycin]   . Celebrex [Celecoxib]   . Penicillins     Hives   . Shellfish Allergy     hives    Current Outpatient Prescriptions  Medication Sig Dispense Refill  . ALPRAZolam (XANAX) 0.5 MG tablet Take 0.125 mg by mouth daily as needed for sleep or anxiety.       [facility] Kitchen atorvastatin (LIPITOR) 10 MG tablet Take 10 mg by mouth daily.      . cholecalciferol (VITAMIN D) 1000 UNITS tablet Take 2,000 Units by mouth daily.      [facility] Kitchen ELIQUIS 2.5 MG TABS tablet TAKE 1 TABLET TWICE A DAY - START ON MONDAY 07/29/2013  60 tablet  4  . levothyroxine (SYNTHROID, LEVOTHROID) 100 MCG tablet Take 100 mcg by mouth daily.      [place] Kitchen lisinopril (PRINIVIL,ZESTRIL) 10 MG tablet Take 10 mg by mouth 2 (two) times daily.      [place] Kitchen loratadine (CLARITIN) 10 MG tablet Take 1 tablet (10 mg total) by mouth daily.  30 tablet  3  . meclizine (ANTIVERT) 25 MG tablet Take 25 mg by mouth 3 (three) times daily as needed for dizziness.      . metoprolol succinate (TOPROL-XL) 25 MG 24 hr tablet Take 1 tablet (25 mg total) by mouth 3 (three) times daily.  90 tablet  6  . omeprazole (PRILOSEC) 20 MG capsule Take 20 mg by mouth daily.       No current  facility-administered medications for this visit.    Past Medical History  Diagnosis Date  . Hypertension   . Vertical vertigo   . Thyroid disease   . High cholesterol   . Anxiety   . Headache(784.0)   . Stroke September 2014    thrombolytic therapy  . Bradycardia     s/p PPM October 2014  . Chronic anticoagulation     Past Surgical History  Procedure Laterality Date  . Cholecystectomy    . Tee without cardioversion N/A 07/16/2013    Procedure: TRANSESOPHAGEAL ECHOCARDIOGRAM (TEE);  Surgeon: Thayer Headings, MD;  Location: [place];  Service: Cardiovascular;  Laterality: N/A;  . Pacemaker insertion      ROS: Review of systems complete and found to be negative unless listed above  PHYSICAL EXAM BP 134/68  Pulse 85  Ht 5\' 4"  (1.626 m)  Wt 141 lb (63.957 kg)  BMI 24.19 kg/m2  SpO2 98%  General: Well developed, well nourished, in no acute distress Head: Eyes PERRLA, No xanthomas.   Normal cephalic and atramatic  Lungs: Clear bilaterally to auscultation and percussion. Heart: HRRR S1 S2, without MRG.  Pulses are 2+ & equal.  No carotid bruit. No JVD.  No abdominal bruits. No femoral bruits. Abdomen: Bowel sounds are positive, abdomen soft and non-tender without masses or                  Hernia's noted. Msk:  Back normal, normal gait. Normal strength and tone for age. Extremities: No clubbing, cyanosis or edema. She has pain with ROM of the left shoulder adduction and abduction, also raising above her head. Neck pain with rotation to the left and right. 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   DP +1 Neuro: Alert and oriented X 3. Psych:  Good affect, responds appropriately.Anixous     ASSESSMENT AND PLAN

## 2014-01-03 NOTE — Assessment & Plan Note (Signed)
Her shoulder pain on the left appears to be related to muscle pain or strain, quiet possibly rotator cuff injury. With pacemaker, she will not be able to have MRI. I will order a left shoulder x-ray and a cervical spine film. She will see Dr. Manuella Ghazi on follow up for ongoing management. She can take tylenol every 6 hours for pain control.

## 2014-01-03 NOTE — Assessment & Plan Note (Signed)
Pacemaker is being interrogated remotely.

## 2014-01-27 ENCOUNTER — Ambulatory Visit (INDEPENDENT_AMBULATORY_CARE_PROVIDER_SITE_OTHER): Payer: Medicare Other | Admitting: *Deleted

## 2014-01-27 ENCOUNTER — Ambulatory Visit: Payer: Medicare Other | Admitting: Nurse Practitioner

## 2014-01-27 ENCOUNTER — Encounter: Payer: Self-pay | Admitting: Internal Medicine

## 2014-01-27 DIAGNOSIS — R55 Syncope and collapse: Secondary | ICD-10-CM

## 2014-01-27 DIAGNOSIS — I4891 Unspecified atrial fibrillation: Secondary | ICD-10-CM

## 2014-01-28 LAB — MDC_IDC_ENUM_SESS_TYPE_REMOTE
Battery Impedance: 100 Ohm
Battery Remaining Longevity: 148 mo
Battery Voltage: 2.78 V
Brady Statistic AP VS Percent: 77 %
Date Time Interrogation Session: 20150413165859
Lead Channel Impedance Value: 552 Ohm
Lead Channel Pacing Threshold Amplitude: 0.625 V
Lead Channel Pacing Threshold Pulse Width: 0.4 ms
Lead Channel Pacing Threshold Pulse Width: 0.4 ms
Lead Channel Setting Pacing Amplitude: 2 V
Lead Channel Setting Pacing Amplitude: 2.5 V
Lead Channel Setting Pacing Pulse Width: 0.4 ms
Lead Channel Setting Sensing Sensitivity: 5.6 mV
MDC IDC MSMT LEADCHNL RA PACING THRESHOLD AMPLITUDE: 0.5 V
MDC IDC MSMT LEADCHNL RA SENSING INTR AMPL: 1.4 mV
MDC IDC MSMT LEADCHNL RV IMPEDANCE VALUE: 844 Ohm
MDC IDC MSMT LEADCHNL RV SENSING INTR AMPL: 11.2 mV
MDC IDC STAT BRADY AP VP PERCENT: 1 %
MDC IDC STAT BRADY AS VP PERCENT: 0 %
MDC IDC STAT BRADY AS VS PERCENT: 21 %

## 2014-01-31 ENCOUNTER — Ambulatory Visit (INDEPENDENT_AMBULATORY_CARE_PROVIDER_SITE_OTHER): Payer: Medicare Other | Admitting: Nurse Practitioner

## 2014-01-31 ENCOUNTER — Encounter: Payer: Self-pay | Admitting: Nurse Practitioner

## 2014-01-31 VITALS — BP 124/75 | HR 86 | Ht 64.0 in | Wt 141.0 lb

## 2014-01-31 DIAGNOSIS — M6281 Muscle weakness (generalized): Secondary | ICD-10-CM

## 2014-01-31 DIAGNOSIS — R531 Weakness: Secondary | ICD-10-CM

## 2014-01-31 NOTE — Progress Notes (Signed)
PATIENT: Jessica Myers DOB: 12-30-1928  REASON FOR VISIT: follow up HISTORY FROM: patient  HISTORY OF PRESENT ILLNESS: Jessica Myers is an 80 y.o. female, right handed, with a past medical history significant for HTN, hyperlipidemia, GERD, brought to [facility] ED by medics as a code stroke due to acute onset left hemiparesis, left face weakness, and dysarthria. She was last known well at 1850 pm on 07/11/13, doing some work at her computer, when her husband noticed that she was having slurred speech, weakness of the left face, and inability to use the left side of her body. Ambulance called and upon initial evaluation in the ED she had NIHSS 16. Of importance, family report that couple of weeks ago she had transient slurred speech and had a brain MRI that was normal CT brain revealed no acute intracranial abnormality. MRI of the brain showed acute right MCA lenticulostriate territory infarct. Slight central hemorrhage. MRA of the brain showed intracranial atherosclerotic changes as described. No significant M1 stenosis on the right, but moderately severe right M2 disease is identified; These lesions are distal to the observed pattern of acute infarction. 2D Echocardiogram EF 60%, wall motion normal, No ASD or PFO identified. Carotid Doppler with bilateral 1-39% ICA stenosis. She received thrombolytic therapy with neurologic recovery. Subsequent workup demonstrated no obvious etiology of her stroke. She underwent insertion of an implantable loop recorder. She was in her usual state of health until a few days after discharge when she fell to the ground. She was not sure if she actually passed out or not. She did not injure herself. She reports a remote history of prior episodes similar to this. In addition, the patient has experienced palpitations on and off throughout her life. Since her stroke, she has very little neurologic impairment and has returned to her daily activities. She denies chest pain or  shortness of breath. 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 She had loop recorder removed and dual chamber pacemaker implanted. She was started on Eliquis, with no signs of bleeding or serious bruising.   UPDATE 01/31/14 (LL): [name] returns to the office for stroke follow up.  Since last visit she has been well, no new neurovascular symptoms.   Her blood pressure is well controlled - it is 124/75 in the office today.  She still does not have the stamina that she had before the stroke but she feels like it is getting better.  She is tolerating Eliquis well without significant bruising or bleeding.  REVIEW OF SYSTEMS: Full 14 system review of systems performed and notable only for: occasional spots of rectal bleeding when wiping, leg swellling, weakness  ALLERGIES: Allergies  Allergen Reactions  . Biaxin [Clarithromycin]   . Celebrex [Celecoxib]   . Penicillins     Hives   . Shellfish Allergy     hives    HOME MEDICATIONS: Outpatient Prescriptions Prior to Visit  Medication Sig Dispense Refill  . ALPRAZolam (XANAX) 0.5 MG tablet Take 0.125 mg by mouth daily as needed for sleep or anxiety.       [facility] Kitchen atorvastatin (LIPITOR) 10 MG tablet Take 10 mg by mouth daily.      . cholecalciferol (VITAMIN D) 1000 UNITS tablet Take 2,000 Units by mouth daily.      [facility] Kitchen ELIQUIS 2.5 MG TABS tablet TAKE 1 TABLET TWICE A DAY - START ON MONDAY 07/29/2013  60 tablet  4  . levothyroxine (SYNTHROID, LEVOTHROID) 100  MCG tablet Take 100 mcg by mouth daily.      [facility] Kitchen lisinopril (PRINIVIL,ZESTRIL) 10 MG tablet Take 10 mg by mouth 2 (two) times daily.      [facility] Kitchen loratadine (CLARITIN) 10 MG tablet Take 1 tablet (10 mg total) by mouth daily.  30 tablet  3  . meclizine (ANTIVERT) 25 MG tablet Take 25 mg by mouth 3 (three) times daily as needed for dizziness.      . metoprolol succinate (TOPROL-XL) 25 MG 24 hr tablet Take 1 tablet (25 mg total) by mouth 3 (three) times daily.   90 tablet  6  . omeprazole (PRILOSEC) 20 MG capsule Take 20 mg by mouth daily.       No facility-administered medications prior to visit.     PHYSICAL EXAM  Filed Vitals:   01/31/14 1454  BP: 124/75  Pulse: 86  Height: 5\' 4"  (1.626 m)  Weight: 141 lb (63.957 kg)   Body mass index is 24.19 kg/(m^2).  Generalized: Well developed, in no acute distress  Head: normocephalic and atraumatic. Oropharynx benign  Neck: Supple, no carotid bruits  Cardiac: Regular rate rhythm, no murmur  Musculoskeletal: No deformity   Neurological examination  Mentation: Alert oriented to time, place, history taking. Follows all commands speech and language fluent  Cranial nerve II-XII: Pupils were equal round reactive to light extraocular movements were full, visual field were full on confrontational test. Facial sensation and strength were normal. hearing was intact to finger rubbing bilaterally. Uvula tongue midline. head turning and shoulder shrug and were normal and symmetric.Tongue protrusion into cheek strength was normal.  Motor: normal bulk and tone, full strength in the BUE, BLE, fine finger movements decreased on left, no pronator drift. No focal weakness, right orbits left hand.  Sensory: normal and symmetric to light touch, pinprick  Coordination: finger-nose-finger, heel-to-shin bilaterally, no dysmetria  Reflexes: Deep tendon reflexes in the upper and lower extremities are present and symmetric.  Gait and Station: Rising up from seated position without assistance, normal stance, without trunk ataxia, moderate stride, good arm swing, smooth turning, able to perform tiptoe, and heel walking without difficulty.    ASSESSMENT AND PLAN [name] is a 80 y.o. female presented with left hemiparesis, dysarthria on 06/10/13. Status post IV t-PA full dose on 07/11/2013. Imaging does show an acute right subcortical infarct with right MCA trifurcation stenosis/clot. Infarct felt to be embolic  secondary to unknown source. Loop recorder was implanted and revealed tachybrady syndrome with sinus arrest and pause > 4 sec only a few days later. The loop recorder was removed and a dual chamber pacemaker was placed; she was started on Eliquis. She has recovered well with only very mild left hemiparesis and dysarthria when tired.   PLAN: Continue Eliquis for atrial fibrillation and as secondary stroke prevention. Maintain strict control of hypertension with blood pressure goal below 130/90, diabetes with hemoglobin A1c goal below 6.5% and lipids with LDL cholesterol goal below 100 mg/dL.  Followup in 6 months with Dr. Leonie Man.  Philmore Pali, MSN, NP-C 01/31/2014, 3:06 PM [facility] [address] [place], [place] [address] [phone]  Note: This document was prepared with digital dictation and possible smart phrase technology. Any transcriptional errors that result from this process are unintentional.

## 2014-02-06 ENCOUNTER — Other Ambulatory Visit: Payer: Self-pay

## 2014-02-06 MED ORDER — METOPROLOL SUCCINATE ER 25 MG PO TB24: 25.0000 mg | ORAL_TABLET | Freq: Three times a day (TID) | ORAL | Status: DC

## 2014-02-11 ENCOUNTER — Encounter: Payer: Self-pay | Admitting: *Deleted

## 2014-02-27 ENCOUNTER — Telehealth: Payer: Self-pay | Admitting: Internal Medicine

## 2014-02-27 NOTE — Telephone Encounter (Signed)
Advised pt to send [facility]

## 2014-02-27 NOTE — Telephone Encounter (Signed)
New message     Pt said her pacemaker felt like it was vibrating after she was using her weed eater.  She want to send a recording to [place].  Will this be ok?

## 2014-03-03 NOTE — Telephone Encounter (Signed)
Transmission received. All normal on remote check.

## 2014-04-10 ENCOUNTER — Telehealth: Payer: Self-pay | Admitting: *Deleted

## 2014-04-10 NOTE — Telephone Encounter (Signed)
Pt initially stated w/ operator that her heart had stopped 3-4 times. Those episodes were related to past recordings from her loop recorder. I updated pt that those episodes were before her pacemaker implant and no longer apply. Pt understands.   Pt also states she felt a "motor running" in her device when she put her hand over her device pocket once last month after doing yard work. She sent an extra remote, remote shows no abnormal device diagnostics. Pt does have history of A-fib. She states she continues to take her eliquis.   Pt also wants to reschedule her remote appointment due to summer vacation. I made her next home remote for 05/05/14.

## 2014-05-05 ENCOUNTER — Telehealth: Payer: Self-pay | Admitting: Internal Medicine

## 2014-05-05 ENCOUNTER — Ambulatory Visit (INDEPENDENT_AMBULATORY_CARE_PROVIDER_SITE_OTHER): Payer: Medicare Other | Admitting: *Deleted

## 2014-05-05 ENCOUNTER — Telehealth: Payer: Self-pay | Admitting: Cardiology

## 2014-05-05 DIAGNOSIS — I495 Sick sinus syndrome: Secondary | ICD-10-CM

## 2014-05-05 NOTE — Telephone Encounter (Signed)
Transmission received, patient aware. 

## 2014-05-05 NOTE — Telephone Encounter (Signed)
LMOVM reminding pt to send remote transmission.   

## 2014-05-05 NOTE — Telephone Encounter (Signed)
New Message  Pt requests a call back to determine if her transmission was received. Please call

## 2014-05-05 NOTE — Progress Notes (Signed)
Remote pacemaker transmission.   

## 2014-05-12 LAB — MDC_IDC_ENUM_SESS_TYPE_REMOTE
Battery Impedance: 100 Ohm
Battery Voltage: 2.78 V
Brady Statistic AP VS Percent: 77 %
Brady Statistic AS VP Percent: 0 %
Brady Statistic AS VS Percent: 22 %
Date Time Interrogation Session: 20150720123732
Lead Channel Impedance Value: 855 Ohm
Lead Channel Pacing Threshold Amplitude: 0.5 V
Lead Channel Pacing Threshold Amplitude: 0.625 V
Lead Channel Pacing Threshold Pulse Width: 0.4 ms
Lead Channel Pacing Threshold Pulse Width: 0.4 ms
Lead Channel Setting Pacing Amplitude: 2.5 V
Lead Channel Setting Pacing Pulse Width: 0.4 ms
Lead Channel Setting Sensing Sensitivity: 5.6 mV
MDC IDC MSMT BATTERY REMAINING LONGEVITY: 149 mo
MDC IDC MSMT LEADCHNL RA IMPEDANCE VALUE: 580 Ohm
MDC IDC MSMT LEADCHNL RA SENSING INTR AMPL: 2.8 mV
MDC IDC MSMT LEADCHNL RV SENSING INTR AMPL: 22.4 mV
MDC IDC SET LEADCHNL RA PACING AMPLITUDE: 2 V
MDC IDC STAT BRADY AP VP PERCENT: 1 %

## 2014-05-19 ENCOUNTER — Other Ambulatory Visit: Payer: Self-pay | Admitting: *Deleted

## 2014-05-19 DIAGNOSIS — I63511 Cerebral infarction due to unspecified occlusion or stenosis of right middle cerebral artery: Secondary | ICD-10-CM

## 2014-05-19 DIAGNOSIS — I482 Chronic atrial fibrillation, unspecified: Secondary | ICD-10-CM

## 2014-05-19 MED ORDER — APIXABAN 2.5 MG PO TABS: ORAL_TABLET | ORAL | Status: DC

## 2014-05-23 ENCOUNTER — Encounter: Payer: Self-pay | Admitting: Cardiology

## 2014-05-27 ENCOUNTER — Encounter: Payer: Self-pay | Admitting: Cardiology

## 2014-06-18 ENCOUNTER — Encounter: Payer: Self-pay | Admitting: Internal Medicine

## 2014-08-05 ENCOUNTER — Ambulatory Visit (INDEPENDENT_AMBULATORY_CARE_PROVIDER_SITE_OTHER): Payer: Medicare Other | Admitting: Neurology

## 2014-08-05 ENCOUNTER — Encounter: Payer: Self-pay | Admitting: Neurology

## 2014-08-05 ENCOUNTER — Ambulatory Visit: Payer: Medicare Other | Admitting: Neurology

## 2014-08-05 VITALS — BP 124/62 | HR 78 | Ht 64.0 in | Wt 142.8 lb

## 2014-08-05 DIAGNOSIS — I482 Chronic atrial fibrillation, unspecified: Secondary | ICD-10-CM

## 2014-08-05 DIAGNOSIS — I63511 Cerebral infarction due to unspecified occlusion or stenosis of right middle cerebral artery: Secondary | ICD-10-CM

## 2014-08-05 MED ORDER — APIXABAN 5 MG PO TABS: ORAL_TABLET | ORAL | Status: DC

## 2014-08-05 NOTE — Progress Notes (Signed)
PATIENT: Jessica Myers DOB: [birth year]  REASON FOR VISIT: follow up HISTORY FROM: patient  HISTORY OF PRESENT ILLNESS: ZOEY BIDWELL is an 80 y.o. female, right handed, with a past medical history significant for HTN, hyperlipidemia, GERD, brought to [facility] ED by medics as a code stroke due to acute onset left hemiparesis, left face weakness, and dysarthria. She was last known well at 1850 pm on 07/11/13, doing some work at her computer, when her husband noticed that she was having slurred speech, weakness of the left face, and inability to use the left side of her body. Ambulance called and upon initial evaluation in the ED she had NIHSS 16. Of importance, family report that couple of weeks ago she had transient slurred speech and had a brain MRI that was normal CT brain revealed no acute intracranial abnormality. MRI of the brain showed acute right MCA lenticulostriate territory infarct. Slight central hemorrhage. MRA of the brain showed intracranial atherosclerotic changes as described. No significant M1 stenosis on the right, but moderately severe right M2 disease is identified; These lesions are distal to the observed pattern of acute infarction. 2D Echocardiogram EF 60%, wall motion normal, No ASD or PFO identified. Carotid Doppler with bilateral 1-39% ICA stenosis. She received thrombolytic therapy with neurologic recovery. Subsequent workup demonstrated no obvious etiology of her stroke. She underwent insertion of an implantable loop recorder. She was in her usual state of health until a few days after discharge when she fell to the ground. She was not sure if she actually passed out or not. She did not injure herself. She reports a remote history of prior episodes similar to this. In addition, the patient has experienced palpitations on and off throughout her life. Since her stroke, she has very little neurologic impairment and has returned to her daily activities. She denies chest pain or  shortness of breath. 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 She had loop recorder removed and dual chamber pacemaker implanted. She was started on Eliquis, with no signs of bleeding or serious bruising.   Follow up 01/31/14 (LL): Ms. Hinson returns to the office for stroke follow up.  Since last visit she has been well, no new neurovascular symptoms.   Her blood pressure is well controlled - it is 124/75 in the office today.  She still does not have the stamina that she had before the stroke but she feels like it is getting better.  She is tolerating Eliquis well without significant bruising or bleeding.  Interval history During the interval time, patient was doing well. She was continued on Eliquis 2.0 mg twice a day, without any side effect. She followup with PCP last month for checkup and blood draw, take with her Synthroid dose. Her blood pressure was well controlled, today is 124/62.   REVIEW OF SYSTEMS: Full 14 system review of systems performed and notable only for: Leg swelling, restless leg.  ALLERGIES: Allergies  Allergen Reactions  . Biaxin [Clarithromycin]   . Celebrex [Celecoxib]   . Penicillins     Hives   . Shellfish Allergy     hives    HOME MEDICATIONS: Outpatient Prescriptions Prior to Visit  Medication Sig Dispense Refill  . ALPRAZolam (XANAX) 0.5 MG tablet Take 0.125 mg by mouth daily as needed for sleep or anxiety.       Marland Kitchen atorvastatin (LIPITOR) 10 MG tablet Take 10 mg by mouth daily.      Marland Kitchen  cholecalciferol (VITAMIN D) 1000 UNITS tablet Take 2,000 Units by mouth daily.      Marland Kitchen lisinopril (PRINIVIL,ZESTRIL) 10 MG tablet Take 10 mg by mouth 2 (two) times daily.      Marland Kitchen loratadine (CLARITIN) 10 MG tablet Take 1 tablet (10 mg total) by mouth daily.  30 tablet  3  . meclizine (ANTIVERT) 25 MG tablet Take 25 mg by mouth 3 (three) times daily as needed for dizziness.      . metoprolol succinate (TOPROL-XL) 25 MG 24 hr tablet  Take 1 tablet (25 mg total) by mouth 3 (three) times daily.  270 tablet  2  . omeprazole (PRILOSEC) 20 MG capsule Take 20 mg by mouth daily.      Marland Kitchen apixaban (ELIQUIS) 2.5 MG TABS tablet TAKE 1 TABLET TWICE A DAY - START ON MONDAY 07/29/2013  60 tablet  4  . levothyroxine (SYNTHROID, LEVOTHROID) 100 MCG tablet Take 100 mcg by mouth daily.       No facility-administered medications prior to visit.     PHYSICAL EXAM  Filed Vitals:   08/05/14 1430  BP: 124/62  Pulse: 78  Height: 5\' 4"  (1.626 m)  Weight: 142 lb 12.8 oz (64.774 kg)   Body mass index is 24.5 kg/(m^2).  Generalized: Well developed, in no acute distress  Head: normocephalic and atraumatic. Oropharynx benign  Neck: Supple, no carotid bruits  Cardiac: Regular rate rhythm, no murmur  Musculoskeletal: No deformity   Neurological examination  Mentation: Alert oriented to time, place, history taking. Follows all commands speech and language fluent  Cranial nerve II-XII: Pupils were equal round reactive to light extraocular movements were full, visual field were full on confrontational test. Facial sensation and strength were normal. hearing was intact to finger rubbing bilaterally. Uvula tongue midline. head turning and shoulder shrug and were normal and symmetric.Tongue protrusion into cheek strength was normal.  Motor: normal bulk and tone, full strength in the BUE, BLE, fine finger movements decreased on left, no pronator drift. No focal weakness, right orbits left hand.  Sensory: normal and symmetric to light touch, pinprick  Coordination: finger-nose-finger, heel-to-shin bilaterally, no dysmetria  Reflexes: Deep tendon reflexes in the upper and lower extremities are present and symmetric.  Gait and Station: Rising up from seated position without assistance, normal stance, without trunk ataxia, moderate stride, good arm swing, smooth turning, able to perform tiptoe, and heel walking without difficulty.    ASSESSMENT  Ms.  KARTHIKA GLASPER is a 80 y.o. female who presented with left hemiparesis, dysarthria on 06/10/13. Status post IV t-PA full dose on 07/11/2013. Imaging does show an acute right subcortical infarct with right MCA trifurcation stenosis/clot. Infarct felt to be embolic secondary to unknown source. Loop recorder was implanted and revealed afib and she was put on eliquis. Further recording revealed tachybrady syndrome with sinus arrest and pause > 4 sec only a few days later. The loop recorder was removed and a dual chamber pacemaker was placed. She has recovered well with only very mild left hemiparesis and dysarthria when tired. However, her eliquis was 2.5mg  bid. She is >18 yo,  and 1 and is and 60 kg normal her creatinine checking on September 2 was 1.25, which is her baseline. Therefore, her appropriate dose of eliquis should be 5mg  bid. I will change the dose for her.  PLAN - Continue Eliquis for atrial fibrillation and as secondary stroke prevention. However, will change her dose to 5 mg bid for appropriate dosing, order placed and  pt informed. - Follow up with PCP for stroke modification - Check blood pressure at home with blood pressure goal below 130/90 - Continue lipitor for stroke prevention - RTC PRN    Rosalin Hawking, MD PhD [facility] [address] [place], [place] [address] [phone]  Patient Instructions  - continue eliquis and lipitor for stroke prevention - continue to follow up with PC for stroke risk factor modification - check BP at home - continue to follow up with cardiologist Dr. Lovena Le - I will call your PCP to check on your blood test last month. If your kidney function OK, I will need to increase your eliquis to 5mg  twice a day.  - follow up as needed.

## 2014-08-05 NOTE — Patient Instructions (Addendum)
-   continue eliquis and lipitor for stroke prevention - continue to follow up with PC for stroke risk factor modification - check BP at home - continue to follow up with cardiologist Dr. Lovena Le - I will call your PCP to check on your blood test last month. If your kidney function OK, I will need to increase your eliquis to 5mg  twice a day.  - follow up as needed.

## 2014-08-11 ENCOUNTER — Ambulatory Visit: Payer: Medicare Other

## 2014-08-14 ENCOUNTER — Encounter: Payer: Self-pay | Admitting: Internal Medicine

## 2014-08-14 ENCOUNTER — Ambulatory Visit (INDEPENDENT_AMBULATORY_CARE_PROVIDER_SITE_OTHER): Payer: Medicare Other | Admitting: *Deleted

## 2014-08-14 DIAGNOSIS — I495 Sick sinus syndrome: Secondary | ICD-10-CM

## 2014-08-14 NOTE — Progress Notes (Signed)
Remote pacemaker transmission.   

## 2014-08-18 ENCOUNTER — Telehealth: Payer: Self-pay | Admitting: *Deleted

## 2014-08-18 ENCOUNTER — Telehealth: Payer: Self-pay | Admitting: Internal Medicine

## 2014-08-18 NOTE — Telephone Encounter (Signed)
New message     Talk to someone in the device clinic.  Pt fell last night on her chest and want to know if she needs to send a remote transmission in.

## 2014-08-18 NOTE — Telephone Encounter (Signed)
Pt was instructed to continue to take Eliquis. This was discussed with Rondel Jumbo, RN and agrees with disposition

## 2014-08-18 NOTE — Telephone Encounter (Signed)
Pt fell last night and hurt the top of her foot, it is blue. Patient wants to know if she needs to stop taking eliquis because of this.

## 2014-08-18 NOTE — Telephone Encounter (Signed)
Pt feels fine today. No symptoms. When pt fell, she landed directly on chest. Pt's concentration of residual pain & impact is on her right side (device on left side). Pt sent extra transmission. Transmission shows normal diagnostics. I updated pt. Pt in AT/AF 18.4% of time. She is taking her eliquis as directed. ROV w/ Dr. Lovena Le in [facility] 11/10/14.

## 2014-08-25 LAB — MDC_IDC_ENUM_SESS_TYPE_REMOTE
Battery Impedance: 100 Ohm
Battery Remaining Longevity: 149 mo
Battery Voltage: 2.78 V
Date Time Interrogation Session: 20151029111638
Lead Channel Impedance Value: 588 Ohm
Lead Channel Pacing Threshold Amplitude: 0.75 V
Lead Channel Pacing Threshold Pulse Width: 0.4 ms
Lead Channel Sensing Intrinsic Amplitude: 11.2 mV
Lead Channel Setting Pacing Amplitude: 2.5 V
Lead Channel Setting Pacing Pulse Width: 0.4 ms
Lead Channel Setting Sensing Sensitivity: 5.6 mV
MDC IDC MSMT LEADCHNL RA PACING THRESHOLD AMPLITUDE: 0.5 V
MDC IDC MSMT LEADCHNL RA PACING THRESHOLD PULSEWIDTH: 0.4 ms
MDC IDC MSMT LEADCHNL RA SENSING INTR AMPL: 1.4 mV
MDC IDC MSMT LEADCHNL RV IMPEDANCE VALUE: 827 Ohm
MDC IDC SET LEADCHNL RA PACING AMPLITUDE: 2 V
MDC IDC STAT BRADY AP VP PERCENT: 1 %
MDC IDC STAT BRADY AP VS PERCENT: 75 %
MDC IDC STAT BRADY AS VP PERCENT: 1 %
MDC IDC STAT BRADY AS VS PERCENT: 23 %

## 2014-08-28 ENCOUNTER — Telehealth: Payer: Self-pay | Admitting: Neurology

## 2014-08-28 NOTE — Telephone Encounter (Signed)
Patient is calling because Dr. Erlinda Hong increased her Eliquis from 2.5mg  to 5 mg. Patient will be seeing a dentist soon to have a tooth pulled. Should the patient decrease the Eliquis back to 2.5mg  or should patient stop taking it until after the tooth is pulled. Please call patient and advise.

## 2014-08-28 NOTE — Telephone Encounter (Signed)
Hi, Casandra:  Please call the pt and ask her to speak with her dentist. Different dentists have different protocols regarding the anticoagulation management before and after the dental procedure. Decreased to 2.5mg  is not the solution. Thank you.  Rosalin Hawking, MD PhD Stroke Neurology 08/28/2014 4:09 PM

## 2014-08-28 NOTE — Telephone Encounter (Signed)
Fwd to Dr. Erlinda Hong.

## 2014-08-29 NOTE — Telephone Encounter (Signed)
Spoke to patient. Gave instructions per Dr. Phoebe Sharps previous note. Patient agreed and verbalized understanding.

## 2014-09-25 ENCOUNTER — Encounter (HOSPITAL_COMMUNITY): Payer: Self-pay | Admitting: Internal Medicine

## 2014-11-10 ENCOUNTER — Encounter: Payer: Self-pay | Admitting: Internal Medicine

## 2014-11-10 ENCOUNTER — Ambulatory Visit (INDEPENDENT_AMBULATORY_CARE_PROVIDER_SITE_OTHER): Payer: BLUE CROSS/BLUE SHIELD | Admitting: Internal Medicine

## 2014-11-10 VITALS — BP 118/80 | HR 71 | Ht 64.0 in | Wt 137.0 lb

## 2014-11-10 DIAGNOSIS — R55 Syncope and collapse: Secondary | ICD-10-CM | POA: Diagnosis not present

## 2014-11-10 LAB — MDC_IDC_ENUM_SESS_TYPE_INCLINIC
Battery Impedance: 110 Ohm
Date Time Interrogation Session: 20160125101450
Lead Channel Impedance Value: 629 Ohm
Lead Channel Pacing Threshold Amplitude: 0.5 V
Lead Channel Pacing Threshold Amplitude: 0.75 V
Lead Channel Pacing Threshold Pulse Width: 0.46 ms
Lead Channel Sensing Intrinsic Amplitude: 15.67 mV
Lead Channel Sensing Intrinsic Amplitude: 5.6 mV
Lead Channel Setting Pacing Amplitude: 2 V
Lead Channel Setting Pacing Amplitude: 2.5 V
Lead Channel Setting Pacing Pulse Width: 0.46 ms
MDC IDC MSMT BATTERY REMAINING LONGEVITY: 145 mo
MDC IDC MSMT BATTERY VOLTAGE: 2.79 V
MDC IDC MSMT LEADCHNL RA PACING THRESHOLD PULSEWIDTH: 0.4 ms
MDC IDC MSMT LEADCHNL RV IMPEDANCE VALUE: 858 Ohm
MDC IDC SET LEADCHNL RV SENSING SENSITIVITY: 5.6 mV
MDC IDC STAT BRADY AP VP PERCENT: 1 %
MDC IDC STAT BRADY AP VS PERCENT: 75 %
MDC IDC STAT BRADY AS VP PERCENT: 1 %
MDC IDC STAT BRADY AS VS PERCENT: 24 %

## 2014-11-10 NOTE — Progress Notes (Signed)
Patient ID: Jessica Myers, female   DOB: 16-Oct-1929, 81 y.o.   MRN: [id number] Jessica PROSPERO is an 81 y.o. female.   Chief Complaint: syncope HPI: The patient is a very pleasant 81 year old woman who returns today for PPM followup. She has a h/o cryptogenic stroke, s/p ILR, who then developed syncope and was found to have a pause and underwent PPM insertion. She has had problems with orthostasis in the past. She denies chest pain or shortness of breath. She occasionally will take a benzodiazepine tablet for anxiety.  She notes a single fall where she tripped on a stair at work. Her foot swelled but no fx. She feels her palpitations from atrial fib.  Past Medical History  Diagnosis Date  . Hypertension   . Vertical vertigo   . Thyroid disease   . High cholesterol   . Anxiety   . Headache(784.0)   . Stroke September 2014    thrombolytic therapy  . Bradycardia     s/p PPM October 2014  . Chronic anticoagulation     Past Surgical History  Procedure Laterality Date  . Cholecystectomy    . Tee without cardioversion N/A 07/16/2013    Procedure: TRANSESOPHAGEAL ECHOCARDIOGRAM (TEE);  Surgeon: Thayer Headings, MD;  Location: [facility];  Service: Cardiovascular;  Laterality: N/A;  . Pacemaker insertion    . Permanent pacemaker insertion N/A 07/25/2013    Procedure: PERMANENT PACEMAKER INSERTION;  Surgeon: Deboraha Sprang, MD;  Location: [facility] LAB;  Service: Cardiovascular;  Laterality: N/A;    No family history on file. Social History:  reports that she has never smoked. She has never used smokeless tobacco. She reports that she does not drink alcohol or use illicit drugs.  Allergies:  Allergies  Allergen Reactions  . Biaxin [Clarithromycin]   . Celebrex [Celecoxib]   . Penicillins     Hives   . Shellfish Allergy     hives     (Not in a hospital admission)  No results found for this or any previous visit (from the past 48 hour(s)). No results found.  ROS - all systems  reviewed and negative except as noted in the history of present illness.  Physical Exam  Blood pressure 118/80, pulse 71, respirations 18 Well appearing 81 year old woman, NAD HEENT: Unremarkable Neck:  6 cm JVD, no thyromegally Back:  No CVA tenderness Lungs:  Clear with no wheezes, rales, or rhonchi. Well-healed pacemaker incision HEART:  Regular rate rhythm, no murmurs, no rubs, no clicks Abd:  Flat, soft, positive bowel sounds, no organomegally, no rebound, no guarding Ext:  2 plus pulses, no edema, no cyanosis, no clubbing Skin:  No rashes no nodules Neuro:  CN II through XII intact, motor grossly intact  PM interogation - normal device function. 20% atrial fib   Assessment/Plan  1. PAF - continue beta blocker. I considered adding flecainide but because she is only mininally symptomatic, will hold off for now. 2. Syncope - she has had no recurrent episodes 3. Falls - she has only had one. I counseled her to be careful. If she has many more falls, she will have to stop her Eliquis 4. HTN - her blood pressure is well controlled. No change in her meds. 5. Stroke - she will continue her Eliquis.   Jessica Myers,M.D.  Cristopher Peru 11/10/2014, 10:04 AM

## 2014-11-10 NOTE — Patient Instructions (Signed)
Your physician wants you to follow-up in: 1 year with Dr. Lovena Le. You will receive a reminder letter in the mail two months in advance. If you don't receive a letter, please call our office to schedule the follow-up appointment.  Remote monitoring is used to monitor your Pacemaker of ICD from home. This monitoring reduces the number of office visits required to check your device to one time per year. It allows [place] to keep an eye on the functioning of your device to ensure it is working properly. You are scheduled for a device check from home on April 25. You may send your transmission at any time that day. If you have a wireless device, the transmission will be sent automatically. After your physician reviews your transmission, you will receive a postcard with your next transmission date.  Your physician recommends that you continue on your current medications as directed. Please refer to the Current Medication list given to you today.  Thank you for choosing [facility]! '

## 2014-11-14 ENCOUNTER — Other Ambulatory Visit: Payer: Self-pay | Admitting: Internal Medicine

## 2014-11-17 ENCOUNTER — Other Ambulatory Visit: Payer: Self-pay | Admitting: Internal Medicine

## 2014-11-19 NOTE — Telephone Encounter (Signed)
Medication Detail      Disp Refills Start End     metoprolol succinate (TOPROL-XL) 25 MG 24 hr tablet 270 tablet 4 11/14/2014     Sig: TAKE 1 TABLET BY MOUTH 3 TIMES DAILY.    E-Prescribing Status: Receipt confirmed by pharmacy (11/14/2014 9:00 AM EST)     Pharmacy    [id number] - [place], [place] - [place]

## 2014-12-16 ENCOUNTER — Other Ambulatory Visit: Payer: Self-pay | Admitting: Neurology

## 2015-02-04 ENCOUNTER — Ambulatory Visit (INDEPENDENT_AMBULATORY_CARE_PROVIDER_SITE_OTHER): Payer: Medicare Other | Admitting: *Deleted

## 2015-02-04 DIAGNOSIS — I495 Sick sinus syndrome: Secondary | ICD-10-CM | POA: Diagnosis not present

## 2015-02-06 NOTE — Progress Notes (Signed)
Remote pacemaker transmission.   

## 2015-02-20 LAB — CUP PACEART REMOTE DEVICE CHECK
Battery Remaining Longevity: 135 mo
Battery Voltage: 2.78 V
Brady Statistic AP VP Percent: 4 %
Brady Statistic AP VS Percent: 24 %
Date Time Interrogation Session: 20160420114537
Lead Channel Impedance Value: 589 Ohm
Lead Channel Pacing Threshold Amplitude: 0.625 V
Lead Channel Pacing Threshold Amplitude: 0.625 V
Lead Channel Pacing Threshold Pulse Width: 0.4 ms
Lead Channel Sensing Intrinsic Amplitude: 1 mV
Lead Channel Sensing Intrinsic Amplitude: 11.2 mV
Lead Channel Setting Pacing Pulse Width: 0.4 ms
MDC IDC MSMT BATTERY IMPEDANCE: 110 Ohm
MDC IDC MSMT LEADCHNL RV IMPEDANCE VALUE: 790 Ohm
MDC IDC MSMT LEADCHNL RV PACING THRESHOLD PULSEWIDTH: 0.4 ms
MDC IDC SET LEADCHNL RA PACING AMPLITUDE: 2 V
MDC IDC SET LEADCHNL RV PACING AMPLITUDE: 2.5 V
MDC IDC SET LEADCHNL RV SENSING SENSITIVITY: 5.6 mV
MDC IDC STAT BRADY AS VP PERCENT: 8 %
MDC IDC STAT BRADY AS VS PERCENT: 64 %

## 2015-02-24 ENCOUNTER — Telehealth: Payer: Self-pay | Admitting: Internal Medicine

## 2015-02-24 NOTE — Telephone Encounter (Signed)
°  1. Has your device fired?   2. Is you device beeping?   3. Are you experiencing draining or swelling at device site?   4. Are you calling to see if we received your device transmission? Y  5. Have you passed out?   Comments: Pt req a call back to discuss if a transmission was received/sr

## 2015-02-24 NOTE — Telephone Encounter (Signed)
Patient voiced her concerns about being in AF 95% since 10-2014. She stated that she had no idea that she had been in AF and felt that someone should have called her bc of her h/o CVA. I tried to explain to the patient that she is currently taking Eliquis for CVA prevention since she does have PAF. I asked patient if she preferred to come in for an ov to discuss the increase in AF burden. Patient states that she would like the appt for either [place] or [place]. Scheduling deferred to [facility].

## 2015-02-24 NOTE — Telephone Encounter (Signed)
Spoke w/ pt and informed her that transmission was received. Informed her of results. Pt aware that she has had an increase in Afib up to 95% of the time. Pt is concerned. Pt aware that MD is still reviewing transmission if he wants to change anything someone will call her w/ this information. Otherwise she will receive a letter. Pt agreed to this plan.

## 2015-02-24 NOTE — Telephone Encounter (Signed)
LMTCB//sss 

## 2015-02-24 NOTE — Telephone Encounter (Signed)
Follow Up  Pt called req a call back to discuss the 95% afib that she was in per remote check. Req a call back before the end of the day if possible

## 2015-02-26 ENCOUNTER — Encounter: Payer: Self-pay | Admitting: Cardiology

## 2015-03-03 ENCOUNTER — Encounter: Payer: Self-pay | Admitting: Internal Medicine

## 2015-03-03 ENCOUNTER — Ambulatory Visit (INDEPENDENT_AMBULATORY_CARE_PROVIDER_SITE_OTHER): Payer: Medicare Other | Admitting: Internal Medicine

## 2015-03-03 VITALS — BP 137/90 | HR 84 | Ht 64.0 in | Wt 136.4 lb

## 2015-03-03 DIAGNOSIS — I480 Paroxysmal atrial fibrillation: Secondary | ICD-10-CM | POA: Diagnosis not present

## 2015-03-03 LAB — CUP PACEART INCLINIC DEVICE CHECK
Battery Impedance: 111 Ohm
Battery Remaining Longevity: 134 mo
Battery Voltage: 2.79 V
Brady Statistic AP VP Percent: 4 %
Brady Statistic AP VS Percent: 20 %
Brady Statistic AS VP Percent: 8 %
Lead Channel Setting Pacing Amplitude: 2 V
Lead Channel Setting Pacing Amplitude: 2.5 V
Lead Channel Setting Pacing Pulse Width: 0.4 ms
Lead Channel Setting Sensing Sensitivity: 5.6 mV
MDC IDC MSMT LEADCHNL RA IMPEDANCE VALUE: 597 Ohm
MDC IDC MSMT LEADCHNL RA SENSING INTR AMPL: 0.5 mV
MDC IDC MSMT LEADCHNL RV IMPEDANCE VALUE: 835 Ohm
MDC IDC MSMT LEADCHNL RV PACING THRESHOLD AMPLITUDE: 0.5 V
MDC IDC MSMT LEADCHNL RV PACING THRESHOLD PULSEWIDTH: 0.4 ms
MDC IDC MSMT LEADCHNL RV SENSING INTR AMPL: 15.67 mV
MDC IDC SESS DTM: 20160517092336
MDC IDC STAT BRADY AS VS PERCENT: 68 %

## 2015-03-03 MED ORDER — FLECAINIDE ACETATE 50 MG PO TABS: 75.0000 mg | ORAL_TABLET | Freq: Two times a day (BID) | ORAL | Status: DC

## 2015-03-03 NOTE — Progress Notes (Signed)
Patient ID: Jessica Myers, female   DOB: 1929/03/18, 81 y.o.   MRN: [id number] Jessica Myers is an 81 y.o. female.   Chief Complaint: syncope HPI: The patient is a very pleasant 81 year old woman who returns today for PPM followup. She has a h/o cryptogenic stroke, s/p ILR, who then developed syncope and was found to have a pause and underwent PPM insertion. She has had problems with orthostasis in the past. She denies chest pain but has had problems with sob. She occasionally will take a benzodiazepine tablet for anxiety. She gives the impression that she has felt worse with more fatigue and sob. She has had no falls. She feels her palpitations minimally from atrial fib.  Past Medical History  Diagnosis Date  . Hypertension   . Vertical vertigo   . Thyroid disease   . High cholesterol   . Anxiety   . Headache(784.0)   . Stroke September 2014    thrombolytic therapy  . Bradycardia     s/p PPM October 2014  . Chronic anticoagulation     Past Surgical History  Procedure Laterality Date  . Cholecystectomy    . Tee without cardioversion N/A 07/16/2013    Procedure: TRANSESOPHAGEAL ECHOCARDIOGRAM (TEE);  Surgeon: Thayer Headings, MD;  Location: [facility];  Service: Cardiovascular;  Laterality: N/A;  . Pacemaker insertion    . Permanent pacemaker insertion N/A 07/25/2013    Procedure: PERMANENT PACEMAKER INSERTION;  Surgeon: Deboraha Sprang, MD;  Location: [facility] CATH LAB;  Service: Cardiovascular;  Laterality: N/A;    No family history on file. Social History:  reports that she has never smoked. She has never used smokeless tobacco. She reports that she does not drink alcohol or use illicit drugs.  Allergies:  Allergies  Allergen Reactions  . Biaxin [Clarithromycin]   . Celebrex [Celecoxib]   . Penicillins     Hives   . Shellfish Allergy     hives     (Not in a hospital admission)  No results found for this or any previous visit (from the past 48 hour(s)). No results  found.  ROS - all systems reviewed and negative except as noted in the history of present illness.  Physical Exam  Blood pressure 118/80, pulse 71, respirations 18 Well appearing 81 year old woman, NAD HEENT: Unremarkable Neck:  6 cm JVD, no thyromegally Back:  No CVA tenderness Lungs:  Clear with no wheezes, rales, or rhonchi. Well-healed pacemaker incision HEART:  Regular rate rhythm, no murmurs, no rubs, no clicks Abd:  Flat, soft, positive bowel sounds, no organomegally, no rebound, no guarding Ext:  2 plus pulses, no edema, no cyanosis, no clubbing Skin:  No rashes no nodules Neuro:  CN II through XII intact, motor grossly intact  PM interogation - normal device function. 20% atrial fib   Assessment/Plan  1. PAF - continue beta blocker. I am going to go ahead and add flecainide because she is appears more symptomatic, will have her come back in a week for an ecg and if not back to nsr will plan DCCV. 2. Syncope - she has had no recurrent episodes 3. Falls - she has had none. I counseled her to be careful.  4. HTN - her blood pressure is well controlled. No change in her meds. 5. Stroke - she will continue her Eliquis.   Kilie Rund,M.D.  Cristopher Peru 03/03/2015, 7:49 AM

## 2015-03-03 NOTE — Patient Instructions (Signed)
Medication Instructions:  Your physician has recommended you make the following change in your medication:  1) Start Flecainide 50 mg ---take 1 1/2 tablets by mouth twice daily    Labwork: .none ordered  Testing/Procedures: None ordered  Follow-Up: Your physician recommends that you schedule a follow-up appointment in: 1  Week for an EKG  If still in afib will need a DCCV week after EKG  Your physician has recommended that you have a Cardioversion (DCCV). Electrical Cardioversion uses a jolt of electricity to your heart either through paddles or wired patches attached to your chest. This is a controlled, usually prescheduled, procedure. Defibrillation is done under light anesthesia in the hospital, and you usually go home the day of the procedure. This is done to get your heart back into a normal rhythm. You are not awake for the procedure. Please see the instruction sheet given to you today.     Any Other Special Instructions Will Be Listed Below (If Applicable).

## 2015-03-10 ENCOUNTER — Ambulatory Visit (INDEPENDENT_AMBULATORY_CARE_PROVIDER_SITE_OTHER): Payer: Medicare Other | Admitting: *Deleted

## 2015-03-10 VITALS — BP 132/66 | HR 76 | Ht 64.0 in | Wt 136.0 lb

## 2015-03-10 DIAGNOSIS — I4891 Unspecified atrial fibrillation: Secondary | ICD-10-CM | POA: Diagnosis not present

## 2015-03-10 NOTE — Progress Notes (Signed)
Pt ambulated without assist to triage room. Pt states she is feeling better, less fatigued. bp 132/66 p76 Ekg performed, Dr Lovena Le reviewed/ SR continue same meds.

## 2015-03-10 NOTE — Patient Instructions (Signed)
CONTINUE SAME MEDICATIONS, KEEP FOLLOW UP APPOINTMENTS, CALL WITH FURTHER NEED.

## 2015-04-09 ENCOUNTER — Other Ambulatory Visit: Payer: Self-pay | Admitting: Neurology

## 2015-04-12 ENCOUNTER — Emergency Department (HOSPITAL_COMMUNITY)
Admission: EM | Admit: 2015-04-12 | Discharge: 2015-04-12 | Disposition: A | Discharge status: Home / Self Care | Payer: Medicare Other | Attending: Emergency Medicine | Admitting: Emergency Medicine

## 2015-04-12 ENCOUNTER — Emergency Department (HOSPITAL_COMMUNITY): Payer: Medicare Other

## 2015-04-12 ENCOUNTER — Encounter (HOSPITAL_COMMUNITY): Payer: Self-pay | Admitting: Emergency Medicine

## 2015-04-12 DIAGNOSIS — E780 Pure hypercholesterolemia: Secondary | ICD-10-CM | POA: Insufficient documentation

## 2015-04-12 DIAGNOSIS — E079 Disorder of thyroid, unspecified: Secondary | ICD-10-CM | POA: Insufficient documentation

## 2015-04-12 DIAGNOSIS — Z880 Allergy status to penicillin: Secondary | ICD-10-CM | POA: Diagnosis not present

## 2015-04-12 DIAGNOSIS — I4891 Unspecified atrial fibrillation: Secondary | ICD-10-CM | POA: Diagnosis not present

## 2015-04-12 DIAGNOSIS — Z79899 Other long term (current) drug therapy: Secondary | ICD-10-CM | POA: Insufficient documentation

## 2015-04-12 DIAGNOSIS — Z8673 Personal history of transient ischemic attack (TIA), and cerebral infarction without residual deficits: Secondary | ICD-10-CM | POA: Diagnosis not present

## 2015-04-12 DIAGNOSIS — W19XXXA Unspecified fall, initial encounter: Secondary | ICD-10-CM

## 2015-04-12 DIAGNOSIS — Z8669 Personal history of other diseases of the nervous system and sense organs: Secondary | ICD-10-CM | POA: Insufficient documentation

## 2015-04-12 DIAGNOSIS — Z7901 Long term (current) use of anticoagulants: Secondary | ICD-10-CM | POA: Insufficient documentation

## 2015-04-12 DIAGNOSIS — W01198A Fall on same level from slipping, tripping and stumbling with subsequent striking against other object, initial encounter: Secondary | ICD-10-CM | POA: Diagnosis not present

## 2015-04-12 DIAGNOSIS — R42 Dizziness and giddiness: Secondary | ICD-10-CM

## 2015-04-12 DIAGNOSIS — S0990XA Unspecified injury of head, initial encounter: Secondary | ICD-10-CM | POA: Diagnosis present

## 2015-04-12 DIAGNOSIS — Y990 Civilian activity done for income or pay: Secondary | ICD-10-CM | POA: Insufficient documentation

## 2015-04-12 DIAGNOSIS — F419 Anxiety disorder, unspecified: Secondary | ICD-10-CM | POA: Insufficient documentation

## 2015-04-12 DIAGNOSIS — Y9389 Activity, other specified: Secondary | ICD-10-CM | POA: Diagnosis not present

## 2015-04-12 DIAGNOSIS — Y92007 Garden or yard of unspecified non-institutional (private) residence as the place of occurrence of the external cause: Secondary | ICD-10-CM | POA: Diagnosis not present

## 2015-04-12 DIAGNOSIS — I10 Essential (primary) hypertension: Secondary | ICD-10-CM | POA: Diagnosis not present

## 2015-04-12 HISTORY — DX: Unspecified atrial fibrillation: I48.91

## 2015-04-12 NOTE — Discharge Instructions (Signed)
Continue take your blood thinner. Main complaint is follow-up with your regular doctor to make sure he had an appropriate workup for this persistent dizziness. Could be related to your prior stroke. No evidence of any acute injury from the fall today. Return for any new or worse symptoms.

## 2015-04-12 NOTE — ED Notes (Addendum)
Patient c/o dizziness after falling and hitting head. Per patient was working in yard when she tripped over her feet falling. Patient unsure what she hit her head on but believes it was possible a rolled up extension cord. Denies any LOC, headache, or blurred vision. Patient does take eliquis. Patient states for past two or three months balance has been "off."

## 2015-04-12 NOTE — ED Provider Notes (Addendum)
CSN: [id number]     Arrival date & time 04/12/15  1825 History   First MD Initiated Contact with Patient 04/12/15 1850     Chief Complaint  Patient presents with  . Head Injury     (Consider location/radiation/quality/duration/timing/severity/associated sxs/prior Treatment) Patient is a 81 y.o. female presenting with head injury. The history is provided by the patient.  Head Injury Associated symptoms: headache   Associated symptoms: no neck pain, no numbness and no seizures    a patient with a history of balance being off for about 2-3 months. Patient is on anti-vert, patient was working in the yard and tripped over her feet falling in the garage landing on a hose or extension cord. Striking the left occiput part of her head. No loss of consciousness. Mild headache and discomfort to that area. No blurred vision. Patient does take blood thinners. Patient has been up on her feet since the fall. Denies any pain to the neck back chest abdomen and legs or arms. Patient currently does feel little dizziness and lightheadedness no true vertigo.  Past Medical History  Diagnosis Date  . Hypertension   . Vertical vertigo   . Thyroid disease   . High cholesterol   . Anxiety   . Headache(784.0)   . Stroke September 2014    thrombolytic therapy  . Bradycardia     s/p PPM October 2014  . Chronic anticoagulation   . A-fib    Past Surgical History  Procedure Laterality Date  . Cholecystectomy    . Tee without cardioversion N/A 07/16/2013    Procedure: TRANSESOPHAGEAL ECHOCARDIOGRAM (TEE);  Surgeon: Thayer Headings, MD;  Location: [place];  Service: Cardiovascular;  Laterality: N/A;  . Pacemaker insertion    . Permanent pacemaker insertion N/A 07/25/2013    Procedure: PERMANENT PACEMAKER INSERTION;  Surgeon: Deboraha Sprang, MD;  Location: [facility] CATH LAB;  Service: Cardiovascular;  Laterality: N/A;   Family History  Problem Relation Age of Onset  . Cancer Mother   . Cancer Father     History  Substance Use Topics  . Smoking status: Never Smoker   . Smokeless tobacco: Never Used  . Alcohol Use: No   OB History    Gravida Para Term Preterm AB TAB SAB Ectopic Multiple Living   2 2 2       2      Review of Systems  Constitutional: Negative for fever.  HENT: Negative for congestion.   Eyes: Negative for visual disturbance.  Respiratory: Negative for shortness of breath.   Cardiovascular: Negative for chest pain.  Gastrointestinal: Negative for abdominal pain.  Genitourinary: Negative for dysuria.  Musculoskeletal: Negative for back pain and neck pain.  Neurological: Positive for dizziness, light-headedness and headaches. Negative for seizures, syncope, weakness and numbness.  Hematological: Bruises/bleeds easily.  Psychiatric/Behavioral: Negative for confusion.      Allergies  Biaxin; Penicillins; Shellfish allergy; and Celebrex  Home Medications   Prior to Admission medications   Medication Sig Start Date End Date Taking? Authorizing Provider  acetaminophen (TYLENOL) 500 MG tablet Take 500-1,000 mg by mouth every 6 (six) hours as needed for mild pain.   Yes Historical Provider, MD  ALPRAZolam Duanne Moron) 0.5 MG tablet Take 0.125 mg by mouth daily as needed for sleep or anxiety.    Yes Historical Provider, MD  atorvastatin (LIPITOR) 10 MG tablet Take 10 mg by mouth daily.   Yes Historical Provider, MD  cholecalciferol (VITAMIN D) 1000 UNITS tablet Take 1,000 Units by mouth  daily.    Yes Historical Provider, MD  ELIQUIS 5 MG TABS tablet TAKE 1 TABLET BY MOUTH TWICE A DAY 04/09/15  Yes Rosalin Hawking, MD  flecainide (TAMBOCOR) 50 MG tablet Take 1.5 tablets (75 mg total) by mouth 2 (two) times daily. 03/03/15  Yes Evans Lance, MD  levothyroxine (SYNTHROID, LEVOTHROID) 88 MCG tablet Take 88 mcg by mouth daily before breakfast.   Yes Historical Provider, MD  lisinopril (PRINIVIL,ZESTRIL) 10 MG tablet Take 10 mg by mouth 2 (two) times daily.   Yes Historical Provider,  MD  loratadine (CLARITIN) 10 MG tablet Take 1 tablet (10 mg total) by mouth daily. Patient taking differently: Take 10 mg by mouth daily as needed.  01/03/14  Yes Lendon Colonel, NP  meclizine (ANTIVERT) 25 MG tablet Take 25 mg by mouth 3 (three) times daily as needed for dizziness.   Yes Historical Provider, MD  metoprolol succinate (TOPROL-XL) 25 MG 24 hr tablet TAKE 1 TABLET BY MOUTH 3 TIMES DAILY. 11/14/14  Yes Evans Lance, MD  omeprazole (PRILOSEC) 20 MG capsule Take 20 mg by mouth at bedtime.    Yes Historical Provider, MD   BP 140/76 mmHg  Pulse 71  Temp(Src) 97.9 F (36.6 C) (Oral)  Resp 16  Ht 5' 4.5" (1.638 m)  Wt 136 lb (61.689 kg)  BMI 22.99 kg/m2  SpO2 98% Physical Exam  Constitutional: She is oriented to person, place, and time. She appears well-developed and well-nourished. No distress.  HENT:  Head: Normocephalic.  Mouth/Throat: Oropharynx is clear and moist.  Left occiput with a red mark no significant hematoma. No laceration. No bleeding.  Eyes: Conjunctivae and EOM are normal. Pupils are equal, round, and reactive to light.  Neck: Normal range of motion.  Cardiovascular: Normal rate, regular rhythm and normal heart sounds.   No murmur heard. Pulmonary/Chest: Effort normal and breath sounds normal. No respiratory distress.  Abdominal: Soft. Bowel sounds are normal. There is no tenderness.  Musculoskeletal: Normal range of motion.  Neurological: She is alert and oriented to person, place, and time. No cranial nerve deficit. She exhibits normal muscle tone. Coordination normal.  Skin: Skin is warm. No rash noted.  Nursing note and vitals reviewed.   ED Course  Procedures (including critical care time) Labs Review Labs Reviewed - No data to display  Imaging Review Ct Head Wo Contrast  04/12/2015   CLINICAL DATA:  Dizziness after falling and hitting head at home today  EXAM: CT HEAD WITHOUT CONTRAST  TECHNIQUE: Contiguous axial images were obtained from the  base of the skull through the vertex without intravenous contrast.  COMPARISON:  07/12/2013, 07/11/2013  FINDINGS: Old right basal ganglia infarct. No acute transcortical infarct. No evidence of mass or hydrocephalus. No hemorrhage or extra-axial fluid. Calvarium intact. Age-related atrophy.  IMPRESSION: Involutional change with prior infarct.  No acute findings.   Electronically Signed   By: Skipper Cliche M.D.   On: 04/12/2015 21:02     EKG Interpretation None      MDM   Final diagnoses:  Head injury  Fall, initial encounter  Dizziness    Patient with a fall in the garage striking her head on the floor but thinks she may have hit the the left occiput part of her head with a garden hose. She essentially tripped over her feet. There was no syncope there was no loss of consciousness. Patient with some mild discomfort to the left occiput. No blurred vision but does feel lightheaded. No  focal neuro deficits. Patient states the past 2-3 months her balance has been off. Primary care doctor is Dr. Manuella Ghazi.  Patient is on [place]. Further workup has been done for this.   Today's head CT was done to rule out any acute injury. Patient's vital signs without any significant abnormalities. Patient nontoxic no acute distress. Patient is on a blood thinner Eliquis. Today's head CT without any acute injury. Patient will require additional follow-up for the persistent dizziness. This may be related to the stroke that she had a year ago. Recommend she see her primary care doctor to make sure workup has been complete. Patient does have meclizine at home.   Fredia Sorrow, MD 04/12/15 [id number]  Fredia Sorrow, MD 04/12/15 2111

## 2015-04-13 ENCOUNTER — Other Ambulatory Visit: Payer: Self-pay

## 2015-06-02 ENCOUNTER — Ambulatory Visit (INDEPENDENT_AMBULATORY_CARE_PROVIDER_SITE_OTHER): Payer: Medicare Other | Admitting: *Deleted

## 2015-06-02 DIAGNOSIS — I495 Sick sinus syndrome: Secondary | ICD-10-CM | POA: Diagnosis not present

## 2015-06-02 NOTE — Progress Notes (Signed)
Remote pacemaker transmission.   

## 2015-06-10 LAB — CUP PACEART REMOTE DEVICE CHECK
Battery Impedance: 111 Ohm
Battery Remaining Longevity: 143 mo
Battery Voltage: 2.79 V
Brady Statistic AP VP Percent: 1 %
Brady Statistic AS VP Percent: 0 %
Date Time Interrogation Session: 20160816110710
Lead Channel Impedance Value: 589 Ohm
Lead Channel Pacing Threshold Amplitude: 0.75 V
Lead Channel Pacing Threshold Pulse Width: 0.4 ms
Lead Channel Setting Pacing Amplitude: 2.5 V
Lead Channel Setting Pacing Pulse Width: 0.4 ms
Lead Channel Setting Sensing Sensitivity: 5.6 mV
MDC IDC MSMT LEADCHNL RV IMPEDANCE VALUE: 812 Ohm
MDC IDC MSMT LEADCHNL RV PACING THRESHOLD AMPLITUDE: 1 V
MDC IDC MSMT LEADCHNL RV PACING THRESHOLD PULSEWIDTH: 0.4 ms
MDC IDC MSMT LEADCHNL RV SENSING INTR AMPL: 11.2 mV
MDC IDC SET LEADCHNL RA PACING AMPLITUDE: 2 V
MDC IDC STAT BRADY AP VS PERCENT: 99 %
MDC IDC STAT BRADY AS VS PERCENT: 0 %

## 2015-06-16 ENCOUNTER — Telehealth: Payer: Self-pay | Admitting: Internal Medicine

## 2015-06-16 NOTE — Telephone Encounter (Signed)
°  1. Has your device fired? No  2. Is you device beeping? No  3. Are you experiencing draining or swelling at device site? No  4. Are you calling to see if we received your device transmission? Yes, and wants to know the details of the report  5. Have you passed out? No

## 2015-06-16 NOTE — Telephone Encounter (Signed)
Returned patient's call.  Explained that her remote check was stable.  Patient was concerned about her AF burden--explained that it had reduced since the last time her device was checked, but that she should still continue to take her Eliquis and other medications as prescribed.  Patient then started asking questions about her husband's AF.  Explained that it would be more appropriate if she spoke with Dr. Court Joy office as they manage his care and that I am unable to advise anything regarding his care.  Patient was appreciative of call and voices understanding of all instructions.  Patient aware to call with worsening symptoms and denies any additional questions or concerns at this time.

## 2015-06-17 ENCOUNTER — Encounter: Payer: Self-pay | Admitting: Cardiology

## 2015-06-23 ENCOUNTER — Encounter: Payer: Self-pay | Admitting: Internal Medicine

## 2015-07-01 ENCOUNTER — Encounter: Payer: Self-pay | Admitting: Cardiology

## 2015-07-04 ENCOUNTER — Other Ambulatory Visit: Payer: Self-pay | Admitting: Adult Health

## 2015-07-06 NOTE — Telephone Encounter (Signed)
Send to PCP or Jory Sims in [place]

## 2015-08-11 ENCOUNTER — Other Ambulatory Visit: Payer: Self-pay | Admitting: Neurology

## 2015-09-01 ENCOUNTER — Ambulatory Visit (INDEPENDENT_AMBULATORY_CARE_PROVIDER_SITE_OTHER): Payer: Medicare Other | Admitting: *Deleted

## 2015-09-01 DIAGNOSIS — I495 Sick sinus syndrome: Secondary | ICD-10-CM

## 2015-09-01 NOTE — Progress Notes (Signed)
Remote pacemaker transmission.   

## 2015-09-09 LAB — CUP PACEART REMOTE DEVICE CHECK
Battery Remaining Longevity: 142 mo
Battery Voltage: 2.79 V
Brady Statistic AP VS Percent: 99 %
Brady Statistic AS VS Percent: 0 %
Implantable Lead Implant Date: 20141009
Implantable Lead Implant Date: 20141009
Implantable Lead Location: 753859
Lead Channel Impedance Value: 543 Ohm
Lead Channel Impedance Value: 833 Ohm
Lead Channel Pacing Threshold Amplitude: 0.75 V
Lead Channel Pacing Threshold Pulse Width: 0.4 ms
Lead Channel Pacing Threshold Pulse Width: 0.4 ms
Lead Channel Sensing Intrinsic Amplitude: 11.2 mV
Lead Channel Setting Pacing Amplitude: 2 V
Lead Channel Setting Pacing Amplitude: 2.5 V
Lead Channel Setting Pacing Pulse Width: 0.4 ms
Lead Channel Setting Sensing Sensitivity: 5.6 mV
MDC IDC LEAD LOCATION: 753860
MDC IDC LEAD MODEL: 1944
MDC IDC LEAD MODEL: 1948
MDC IDC MSMT BATTERY IMPEDANCE: 111 Ohm
MDC IDC MSMT LEADCHNL RV PACING THRESHOLD AMPLITUDE: 0.875 V
MDC IDC SESS DTM: 20161115143011
MDC IDC STAT BRADY AP VP PERCENT: 1 %
MDC IDC STAT BRADY AS VP PERCENT: 0 %

## 2015-09-11 ENCOUNTER — Other Ambulatory Visit: Payer: Self-pay | Admitting: Neurology

## 2015-09-16 ENCOUNTER — Encounter: Payer: Self-pay | Admitting: Cardiology

## 2015-09-30 ENCOUNTER — Encounter: Payer: Self-pay | Admitting: Cardiology

## 2015-10-10 ENCOUNTER — Other Ambulatory Visit: Payer: Self-pay | Admitting: Neurology

## 2015-10-12 ENCOUNTER — Other Ambulatory Visit: Payer: Self-pay

## 2015-10-13 ENCOUNTER — Other Ambulatory Visit: Payer: Self-pay | Admitting: Neurology

## 2015-10-15 ENCOUNTER — Other Ambulatory Visit: Payer: Self-pay

## 2015-10-16 ENCOUNTER — Telehealth: Payer: Self-pay | Admitting: Internal Medicine

## 2015-10-16 ENCOUNTER — Other Ambulatory Visit: Payer: Self-pay | Admitting: *Deleted

## 2015-10-16 MED ORDER — APIXABAN 5 MG PO TABS: 5.0000 mg | ORAL_TABLET | Freq: Two times a day (BID) | ORAL | Status: DC

## 2015-10-16 NOTE — Telephone Encounter (Signed)
°*  STAT* If patient is at the pharmacy, call can be transferred to refill team.   1. Which medications need to be refilled? (please list name of each medication and dose if known) Eliquis 5mg   2. Which pharmacy/location (including street and city if local pharmacy) is medication to be sent to? [facility] in [place] [phone] 3. Do they need a 30 day or 90 day supply? [place]

## 2015-11-13 ENCOUNTER — Encounter: Payer: Self-pay | Admitting: Internal Medicine

## 2015-11-13 ENCOUNTER — Ambulatory Visit (INDEPENDENT_AMBULATORY_CARE_PROVIDER_SITE_OTHER): Payer: PPO | Admitting: Internal Medicine

## 2015-11-13 ENCOUNTER — Other Ambulatory Visit: Payer: Self-pay | Admitting: *Deleted

## 2015-11-13 VITALS — BP 122/72 | HR 88 | Ht 65.0 in | Wt 139.0 lb

## 2015-11-13 DIAGNOSIS — I4891 Unspecified atrial fibrillation: Secondary | ICD-10-CM | POA: Diagnosis not present

## 2015-11-13 LAB — CUP PACEART INCLINIC DEVICE CHECK
Brady Statistic AP VS Percent: 98.8 %
Brady Statistic AS VP Percent: 0.1 % — CL
Brady Statistic AS VS Percent: 0.2 %
Date Time Interrogation Session: 20170127102126
Implantable Lead Implant Date: 20141009
Implantable Lead Location: 753860
Implantable Lead Model: 1944
Implantable Lead Model: 1948
Lead Channel Pacing Threshold Amplitude: 0.75 V
Lead Channel Pacing Threshold Amplitude: 1 V
Lead Channel Pacing Threshold Pulse Width: 0.4 ms
Lead Channel Pacing Threshold Pulse Width: 0.4 ms
Lead Channel Sensing Intrinsic Amplitude: 16 mV
Lead Channel Setting Pacing Pulse Width: 0.4 ms
Lead Channel Setting Sensing Sensitivity: 5.6 mV
MDC IDC LEAD IMPLANT DT: 20141009
MDC IDC LEAD LOCATION: 753859
MDC IDC MSMT BATTERY IMPEDANCE: 111 Ohm
MDC IDC MSMT BATTERY REMAINING LONGEVITY: 144 mo
MDC IDC MSMT BATTERY VOLTAGE: 2.79 V
MDC IDC MSMT LEADCHNL RA IMPEDANCE VALUE: 544 Ohm
MDC IDC MSMT LEADCHNL RV IMPEDANCE VALUE: 853 Ohm
MDC IDC SET LEADCHNL RA PACING AMPLITUDE: 2 V
MDC IDC SET LEADCHNL RV PACING AMPLITUDE: 2.5 V
MDC IDC STAT BRADY AP VP PERCENT: 1 %

## 2015-11-13 MED ORDER — APIXABAN 5 MG PO TABS: 5.0000 mg | ORAL_TABLET | Freq: Two times a day (BID) | ORAL | Status: DC

## 2015-11-13 MED ORDER — FLECAINIDE ACETATE 150 MG PO TABS: 75.0000 mg | ORAL_TABLET | Freq: Two times a day (BID) | ORAL | Status: DC

## 2015-11-13 NOTE — Patient Instructions (Addendum)
Your physician wants you to follow-up in: 12 Months with Dr. Lovena Le. You will receive a reminder letter in the mail two months in advance. If you don't receive a letter, please call our office to schedule the follow-up appointment.  Remote monitoring is used to monitor your Pacemaker of ICD from home. This monitoring reduces the number of office visits required to check your device to one time per year. It allows [place] to keep an eye on the functioning of your device to ensure it is working properly. You are scheduled for a device check from home on 02/15/16. You may send your transmission at any time that day. If you have a wireless device, the transmission will be sent automatically. After your physician reviews your transmission, you will receive a postcard with your next transmission date.   Increase Flecainide to 1/2 Tablet ( 75 mg) Two Times Daily   Your physician recommends that you continue on your current medications as directed. Please refer to the Current Medication list given to you today.  If you need a refill on your cardiac medications before your next appointment, please call your pharmacy.  Thank you for choosing [facility]!

## 2015-11-13 NOTE — Progress Notes (Signed)
Patient ID: Jessica Myers, female   DOB: 09-Apr-1929, 82 y.o.   MRN: [id number] Jessica Myers is an 82 y.o. female.   Chief Complaint: syncope HPI: The patient is a very pleasant 82 year old woman who returns today for PPM followup. She has a h/o cryptogenic stroke, s/p ILR, who then developed syncope and was found to have a pause and atrial fib and underwent PPM insertion. She has had problems with orthostasis in the past. She denies chest pain and her sob has improved. She occasionally will take a benzodiazepine tablet for anxiety. When I saw her several months ago, she was having more problems with her atrial fib and was started on flecainide. She has done well with this and her last episode of atrial fib was in May when we started her medications.   Past Medical History  Diagnosis Date  . Hypertension   . Vertical vertigo   . Thyroid disease   . High cholesterol   . Anxiety   . Headache(784.0)   . Stroke September 2014    thrombolytic therapy  . Bradycardia     s/p PPM October 2014  . Chronic anticoagulation   . A-fib     Past Surgical History  Procedure Laterality Date  . Cholecystectomy    . Tee without cardioversion N/A 07/16/2013    Procedure: TRANSESOPHAGEAL ECHOCARDIOGRAM (TEE);  Surgeon: Thayer Headings, MD;  Location: [place];  Service: Cardiovascular;  Laterality: N/A;  . Pacemaker insertion    . Permanent pacemaker insertion N/A 07/25/2013    Procedure: PERMANENT PACEMAKER INSERTION;  Surgeon: Deboraha Sprang, MD;  Location: [facility] CATH LAB;  Service: Cardiovascular;  Laterality: N/A;    Family History  Problem Relation Age of Onset  . Cancer Mother   . Cancer Father    Social History:  reports that she has never smoked. She has never used smokeless tobacco. She reports that she does not drink alcohol or use illicit drugs.  Allergies:  Allergies  Allergen Reactions  . Biaxin [Clarithromycin] Other (See Comments)    Unknown  . Penicillins Other (See Comments)    Hives   . Shellfish Allergy Nausea And Vomiting  . Celebrex [Celecoxib] Rash     (Not in a hospital admission)  No results found for this or any previous visit (from the past 48 hour(s)). No results found.  ROS - all systems reviewed and negative except as noted in the history of present illness.  Physical Exam  Blood pressure 118/80, pulse 71, respirations 18 Well appearing 82 year old woman, NAD HEENT: Unremarkable Neck:  6 cm JVD, no thyromegally Back:  No CVA tenderness Lungs:  Clear with no wheezes, rales, or rhonchi. Well-healed pacemaker incision HEART:  Regular rate rhythm, no murmurs, no rubs, no clicks Abd:  Flat, soft, positive bowel sounds, no organomegally, no rebound, no guarding Ext:  2 plus pulses, no edema, no cyanosis, no clubbing Skin:  No rashes no nodules Neuro:  CN II through XII intact, motor grossly intact  PM interogation - normal device function. No atrial fib since May 16   Assessment/Plan  1. PAF - continue beta blocker and flecainide 75 bid.  2. Syncope - she has had no recurrent episodes since her PPM was placed 3. Falls - she has had a couple. At this point the risk/benefit of anti-coagulation still is in favor of continuing her Eliquis. 4. HTN - her blood pressure is well controlled. No change in her meds. 5. Stroke - she will continue her  Eliquis.   Gregg Taylor,M.D.  Cristopher Peru 11/13/2015, 8:32 AM

## 2015-11-28 ENCOUNTER — Other Ambulatory Visit: Payer: Self-pay | Admitting: Internal Medicine

## 2015-12-01 DIAGNOSIS — C44629 Squamous cell carcinoma of skin of left upper limb, including shoulder: Secondary | ICD-10-CM | POA: Diagnosis not present

## 2015-12-01 DIAGNOSIS — Z85828 Personal history of other malignant neoplasm of skin: Secondary | ICD-10-CM | POA: Diagnosis not present

## 2015-12-01 DIAGNOSIS — T1490 Injury, unspecified: Secondary | ICD-10-CM | POA: Diagnosis not present

## 2015-12-01 DIAGNOSIS — Z08 Encounter for follow-up examination after completed treatment for malignant neoplasm: Secondary | ICD-10-CM | POA: Diagnosis not present

## 2016-01-05 DIAGNOSIS — Z08 Encounter for follow-up examination after completed treatment for malignant neoplasm: Secondary | ICD-10-CM | POA: Diagnosis not present

## 2016-01-05 DIAGNOSIS — Z85828 Personal history of other malignant neoplasm of skin: Secondary | ICD-10-CM | POA: Diagnosis not present

## 2016-01-21 DIAGNOSIS — I10 Essential (primary) hypertension: Secondary | ICD-10-CM | POA: Diagnosis not present

## 2016-01-21 DIAGNOSIS — R42 Dizziness and giddiness: Secondary | ICD-10-CM | POA: Diagnosis not present

## 2016-01-21 DIAGNOSIS — Z789 Other specified health status: Secondary | ICD-10-CM | POA: Diagnosis not present

## 2016-01-21 DIAGNOSIS — H6123 Impacted cerumen, bilateral: Secondary | ICD-10-CM | POA: Diagnosis not present

## 2016-01-21 DIAGNOSIS — Z6824 Body mass index (BMI) 24.0-24.9, adult: Secondary | ICD-10-CM | POA: Diagnosis not present

## 2016-02-15 ENCOUNTER — Ambulatory Visit (INDEPENDENT_AMBULATORY_CARE_PROVIDER_SITE_OTHER): Payer: PPO | Admitting: *Deleted

## 2016-02-15 ENCOUNTER — Telehealth: Payer: Self-pay | Admitting: Cardiology

## 2016-02-15 DIAGNOSIS — I495 Sick sinus syndrome: Secondary | ICD-10-CM

## 2016-02-15 NOTE — Telephone Encounter (Signed)
LMOVM reminding pt to send remote transmission.   

## 2016-02-15 NOTE — Telephone Encounter (Signed)
Pt called and stated that she wanted to know if her heart stopped last night. Device tech reviewed transmission and pt was made aware that her device function was normal and that her PPM will not allow her heart to stop. Pt verbalized understanding.

## 2016-02-15 NOTE — Progress Notes (Signed)
Remote pacemaker transmission.   

## 2016-03-07 DIAGNOSIS — I10 Essential (primary) hypertension: Secondary | ICD-10-CM | POA: Diagnosis not present

## 2016-03-07 DIAGNOSIS — E7800 Pure hypercholesterolemia, unspecified: Secondary | ICD-10-CM | POA: Diagnosis not present

## 2016-03-07 DIAGNOSIS — I509 Heart failure, unspecified: Secondary | ICD-10-CM | POA: Diagnosis not present

## 2016-03-07 DIAGNOSIS — I4891 Unspecified atrial fibrillation: Secondary | ICD-10-CM | POA: Diagnosis not present

## 2016-03-25 ENCOUNTER — Encounter: Payer: Self-pay | Admitting: Cardiology

## 2016-03-25 LAB — CUP PACEART REMOTE DEVICE CHECK
Battery Impedance: 111 Ohm
Battery Remaining Longevity: 142 mo
Battery Voltage: 2.79 V
Brady Statistic AP VS Percent: 99 %
Implantable Lead Implant Date: 20141009
Implantable Lead Location: 753859
Implantable Lead Model: 1944
Lead Channel Impedance Value: 553 Ohm
Lead Channel Impedance Value: 857 Ohm
Lead Channel Setting Pacing Amplitude: 2 V
Lead Channel Setting Pacing Amplitude: 2.5 V
Lead Channel Setting Pacing Pulse Width: 0.4 ms
MDC IDC LEAD IMPLANT DT: 20141009
MDC IDC LEAD LOCATION: 753860
MDC IDC LEAD MODEL: 1948
MDC IDC MSMT LEADCHNL RA PACING THRESHOLD AMPLITUDE: 0.75 V
MDC IDC MSMT LEADCHNL RA PACING THRESHOLD PULSEWIDTH: 0.4 ms
MDC IDC MSMT LEADCHNL RV PACING THRESHOLD AMPLITUDE: 1 V
MDC IDC MSMT LEADCHNL RV PACING THRESHOLD PULSEWIDTH: 0.4 ms
MDC IDC MSMT LEADCHNL RV SENSING INTR AMPL: 16 mV
MDC IDC SESS DTM: 20170501152125
MDC IDC SET LEADCHNL RV SENSING SENSITIVITY: 5.6 mV
MDC IDC STAT BRADY AP VP PERCENT: 1 %
MDC IDC STAT BRADY AS VP PERCENT: 0 %
MDC IDC STAT BRADY AS VS PERCENT: 0 %

## 2016-03-30 ENCOUNTER — Telehealth: Payer: Self-pay | Admitting: Internal Medicine

## 2016-03-30 NOTE — Telephone Encounter (Signed)
[facility] requesting call back.  Transmission received and was normal for patient.  Letter mailed 03/25/16.

## 2016-03-30 NOTE — Telephone Encounter (Signed)
New Message:   She wants to know if you received her transmission from 02-15-16? She said she had not heard anything.

## 2016-03-30 NOTE — Telephone Encounter (Signed)
Spoke with and informed her that her remote transmission was received and normal. Next transmission is on 05-16-16. Pt verbalized understanding.

## 2016-04-12 DIAGNOSIS — I509 Heart failure, unspecified: Secondary | ICD-10-CM | POA: Diagnosis not present

## 2016-04-12 DIAGNOSIS — I4891 Unspecified atrial fibrillation: Secondary | ICD-10-CM | POA: Diagnosis not present

## 2016-04-12 DIAGNOSIS — I10 Essential (primary) hypertension: Secondary | ICD-10-CM | POA: Diagnosis not present

## 2016-04-12 DIAGNOSIS — E7800 Pure hypercholesterolemia, unspecified: Secondary | ICD-10-CM | POA: Diagnosis not present

## 2016-04-27 DIAGNOSIS — Z803 Family history of malignant neoplasm of breast: Secondary | ICD-10-CM | POA: Diagnosis not present

## 2016-04-27 DIAGNOSIS — Z1231 Encounter for screening mammogram for malignant neoplasm of breast: Secondary | ICD-10-CM | POA: Diagnosis not present

## 2016-05-02 DIAGNOSIS — R928 Other abnormal and inconclusive findings on diagnostic imaging of breast: Secondary | ICD-10-CM | POA: Diagnosis not present

## 2016-05-02 DIAGNOSIS — R922 Inconclusive mammogram: Secondary | ICD-10-CM | POA: Diagnosis not present

## 2016-05-16 ENCOUNTER — Ambulatory Visit (INDEPENDENT_AMBULATORY_CARE_PROVIDER_SITE_OTHER): Payer: PPO | Admitting: *Deleted

## 2016-05-16 DIAGNOSIS — I495 Sick sinus syndrome: Secondary | ICD-10-CM | POA: Diagnosis not present

## 2016-05-16 NOTE — Progress Notes (Signed)
Remote pacemaker transmission.   

## 2016-05-20 LAB — CUP PACEART REMOTE DEVICE CHECK
Brady Statistic AP VP Percent: 1 %
Brady Statistic AS VP Percent: 0 %
Brady Statistic AS VS Percent: 0 %
Date Time Interrogation Session: 20170731124405
Implantable Lead Implant Date: 20141009
Implantable Lead Location: 753860
Implantable Lead Model: 1948
Lead Channel Pacing Threshold Amplitude: 0.75 V
Lead Channel Pacing Threshold Amplitude: 0.875 V
Lead Channel Pacing Threshold Pulse Width: 0.4 ms
Lead Channel Pacing Threshold Pulse Width: 0.4 ms
Lead Channel Sensing Intrinsic Amplitude: 11.2 mV
Lead Channel Setting Pacing Pulse Width: 0.4 ms
Lead Channel Setting Sensing Sensitivity: 5.6 mV
MDC IDC LEAD IMPLANT DT: 20141009
MDC IDC LEAD LOCATION: 753859
MDC IDC LEAD MODEL: 1944
MDC IDC MSMT BATTERY IMPEDANCE: 134 Ohm
MDC IDC MSMT BATTERY REMAINING LONGEVITY: 136 mo
MDC IDC MSMT BATTERY VOLTAGE: 2.78 V
MDC IDC MSMT LEADCHNL RA IMPEDANCE VALUE: 543 Ohm
MDC IDC MSMT LEADCHNL RV IMPEDANCE VALUE: 833 Ohm
MDC IDC SET LEADCHNL RA PACING AMPLITUDE: 2 V
MDC IDC SET LEADCHNL RV PACING AMPLITUDE: 2.5 V
MDC IDC STAT BRADY AP VS PERCENT: 99 %

## 2016-05-24 ENCOUNTER — Encounter: Payer: Self-pay | Admitting: Cardiology

## 2016-06-14 DIAGNOSIS — H02831 Dermatochalasis of right upper eyelid: Secondary | ICD-10-CM | POA: Diagnosis not present

## 2016-06-14 DIAGNOSIS — H04123 Dry eye syndrome of bilateral lacrimal glands: Secondary | ICD-10-CM | POA: Diagnosis not present

## 2016-06-17 DIAGNOSIS — S27329A Contusion of lung, unspecified, initial encounter: Secondary | ICD-10-CM

## 2016-06-17 HISTORY — DX: Contusion of lung, unspecified, initial encounter: S27.329A

## 2016-06-24 NOTE — Telephone Encounter (Signed)
Please close Encounter °

## 2016-07-01 DIAGNOSIS — Z6824 Body mass index (BMI) 24.0-24.9, adult: Secondary | ICD-10-CM | POA: Diagnosis not present

## 2016-07-01 DIAGNOSIS — E559 Vitamin D deficiency, unspecified: Secondary | ICD-10-CM | POA: Diagnosis not present

## 2016-07-01 DIAGNOSIS — R5383 Other fatigue: Secondary | ICD-10-CM | POA: Diagnosis not present

## 2016-07-01 DIAGNOSIS — Z1211 Encounter for screening for malignant neoplasm of colon: Secondary | ICD-10-CM | POA: Diagnosis not present

## 2016-07-01 DIAGNOSIS — E7800 Pure hypercholesterolemia, unspecified: Secondary | ICD-10-CM | POA: Diagnosis not present

## 2016-07-01 DIAGNOSIS — Z7189 Other specified counseling: Secondary | ICD-10-CM | POA: Diagnosis not present

## 2016-07-01 DIAGNOSIS — Z1389 Encounter for screening for other disorder: Secondary | ICD-10-CM | POA: Diagnosis not present

## 2016-07-01 DIAGNOSIS — S2249XA Multiple fractures of ribs, unspecified side, initial encounter for closed fracture: Secondary | ICD-10-CM

## 2016-07-01 DIAGNOSIS — Z0000 Encounter for general adult medical examination without abnormal findings: Secondary | ICD-10-CM | POA: Diagnosis not present

## 2016-07-01 DIAGNOSIS — Z79899 Other long term (current) drug therapy: Secondary | ICD-10-CM | POA: Diagnosis not present

## 2016-07-01 DIAGNOSIS — Z299 Encounter for prophylactic measures, unspecified: Secondary | ICD-10-CM | POA: Diagnosis not present

## 2016-07-01 HISTORY — DX: Multiple fractures of ribs, unspecified side, initial encounter for closed fracture: S22.49XA

## 2016-07-02 ENCOUNTER — Inpatient Hospital Stay (HOSPITAL_COMMUNITY)
Admission: EM | Admit: 2016-07-02 | Discharge: 2016-07-07 | DRG: 200 | Disposition: A | Discharge status: Skilled Nursing Facility | Payer: PPO | Attending: General Surgery | Admitting: General Surgery

## 2016-07-02 ENCOUNTER — Encounter (HOSPITAL_COMMUNITY): Payer: Self-pay | Admitting: Emergency Medicine

## 2016-07-02 ENCOUNTER — Emergency Department (HOSPITAL_COMMUNITY): Payer: PPO

## 2016-07-02 ENCOUNTER — Inpatient Hospital Stay (HOSPITAL_COMMUNITY): Payer: PPO

## 2016-07-02 DIAGNOSIS — R262 Difficulty in walking, not elsewhere classified: Secondary | ICD-10-CM | POA: Diagnosis not present

## 2016-07-02 DIAGNOSIS — M5489 Other dorsalgia: Secondary | ICD-10-CM | POA: Diagnosis not present

## 2016-07-02 DIAGNOSIS — Z950 Presence of cardiac pacemaker: Secondary | ICD-10-CM | POA: Diagnosis not present

## 2016-07-02 DIAGNOSIS — I10 Essential (primary) hypertension: Secondary | ICD-10-CM | POA: Diagnosis not present

## 2016-07-02 DIAGNOSIS — Z7901 Long term (current) use of anticoagulants: Secondary | ICD-10-CM

## 2016-07-02 DIAGNOSIS — S299XXA Unspecified injury of thorax, initial encounter: Secondary | ICD-10-CM | POA: Diagnosis not present

## 2016-07-02 DIAGNOSIS — M546 Pain in thoracic spine: Secondary | ICD-10-CM | POA: Diagnosis not present

## 2016-07-02 DIAGNOSIS — E7800 Pure hypercholesterolemia, unspecified: Secondary | ICD-10-CM | POA: Diagnosis present

## 2016-07-02 DIAGNOSIS — D62 Acute posthemorrhagic anemia: Secondary | ICD-10-CM | POA: Diagnosis present

## 2016-07-02 DIAGNOSIS — W109XXA Fall (on) (from) unspecified stairs and steps, initial encounter: Secondary | ICD-10-CM | POA: Diagnosis not present

## 2016-07-02 DIAGNOSIS — S270XXA Traumatic pneumothorax, initial encounter: Principal | ICD-10-CM | POA: Diagnosis present

## 2016-07-02 DIAGNOSIS — S27329A Contusion of lung, unspecified, initial encounter: Secondary | ICD-10-CM | POA: Diagnosis not present

## 2016-07-02 DIAGNOSIS — J939 Pneumothorax, unspecified: Secondary | ICD-10-CM

## 2016-07-02 DIAGNOSIS — I4891 Unspecified atrial fibrillation: Secondary | ICD-10-CM | POA: Diagnosis not present

## 2016-07-02 DIAGNOSIS — F419 Anxiety disorder, unspecified: Secondary | ICD-10-CM | POA: Diagnosis not present

## 2016-07-02 DIAGNOSIS — E039 Hypothyroidism, unspecified: Secondary | ICD-10-CM | POA: Diagnosis not present

## 2016-07-02 DIAGNOSIS — S199XXA Unspecified injury of neck, initial encounter: Secondary | ICD-10-CM | POA: Diagnosis not present

## 2016-07-02 DIAGNOSIS — S27321A Contusion of lung, unilateral, initial encounter: Secondary | ICD-10-CM | POA: Diagnosis not present

## 2016-07-02 DIAGNOSIS — S62639A Displaced fracture of distal phalanx of unspecified finger, initial encounter for closed fracture: Secondary | ICD-10-CM | POA: Diagnosis present

## 2016-07-02 DIAGNOSIS — S4991XA Unspecified injury of right shoulder and upper arm, initial encounter: Secondary | ICD-10-CM | POA: Diagnosis not present

## 2016-07-02 DIAGNOSIS — S62522A Displaced fracture of distal phalanx of left thumb, initial encounter for closed fracture: Secondary | ICD-10-CM | POA: Diagnosis not present

## 2016-07-02 DIAGNOSIS — S0083XA Contusion of other part of head, initial encounter: Secondary | ICD-10-CM | POA: Diagnosis not present

## 2016-07-02 DIAGNOSIS — S3991XA Unspecified injury of abdomen, initial encounter: Secondary | ICD-10-CM | POA: Diagnosis not present

## 2016-07-02 DIAGNOSIS — W108XXA Fall (on) (from) other stairs and steps, initial encounter: Secondary | ICD-10-CM | POA: Diagnosis present

## 2016-07-02 DIAGNOSIS — M25511 Pain in right shoulder: Secondary | ICD-10-CM | POA: Diagnosis not present

## 2016-07-02 DIAGNOSIS — S2241XA Multiple fractures of ribs, right side, initial encounter for closed fracture: Secondary | ICD-10-CM | POA: Diagnosis present

## 2016-07-02 DIAGNOSIS — T148 Other injury of unspecified body region: Secondary | ICD-10-CM | POA: Diagnosis not present

## 2016-07-02 HISTORY — DX: Multiple fractures of ribs, unspecified side, initial encounter for closed fracture: S22.49XA

## 2016-07-02 HISTORY — DX: Contusion of lung, unspecified, initial encounter: S27.329A

## 2016-07-02 LAB — CBC WITH DIFFERENTIAL/PLATELET
Basophils Absolute: 0 10*3/uL (ref 0.0–0.1)
Basophils Relative: 0 %
Eosinophils Absolute: 0.1 10*3/uL (ref 0.0–0.7)
Eosinophils Relative: 1 %
HEMATOCRIT: 37.4 % (ref 36.0–46.0)
Hemoglobin: 11.8 g/dL — ABNORMAL LOW (ref 12.0–15.0)
LYMPHS PCT: 8 %
Lymphs Abs: 0.9 10*3/uL (ref 0.7–4.0)
MCH: 30.3 pg (ref 26.0–34.0)
MCHC: 31.6 g/dL (ref 30.0–36.0)
MCV: 96.1 fL (ref 78.0–100.0)
Monocytes Absolute: 0.8 10*3/uL (ref 0.1–1.0)
Monocytes Relative: 7 %
NEUTROS ABS: 9.1 10*3/uL — AB (ref 1.7–7.7)
NEUTROS PCT: 84 %
Platelets: 169 10*3/uL (ref 150–400)
RBC: 3.89 MIL/uL (ref 3.87–5.11)
RDW: 12.9 % (ref 11.5–15.5)
WBC: 10.9 10*3/uL — AB (ref 4.0–10.5)

## 2016-07-02 LAB — I-STAT CG4 LACTIC ACID, ED: Lactic Acid, Venous: 1.68 mmol/L (ref 0.5–1.9)

## 2016-07-02 LAB — BASIC METABOLIC PANEL
ANION GAP: 4 — AB (ref 5–15)
BUN: 24 mg/dL — ABNORMAL HIGH (ref 6–20)
CALCIUM: 9.3 mg/dL (ref 8.9–10.3)
CO2: 29 mmol/L (ref 22–32)
CREATININE: 1.35 mg/dL — AB (ref 0.44–1.00)
Chloride: 105 mmol/L (ref 101–111)
GFR calc non Af Amer: 35 mL/min — ABNORMAL LOW (ref >60)
GFR, EST AFRICAN AMERICAN: 40 mL/min — AB (ref >60)
Glucose, Bld: 127 mg/dL — ABNORMAL HIGH (ref 65–99)
Potassium: 4.5 mmol/L (ref 3.5–5.1)
SODIUM: 138 mmol/L (ref 135–145)

## 2016-07-02 LAB — CBG MONITORING, ED: GLUCOSE-CAPILLARY: 102 mg/dL — AB (ref 65–99)

## 2016-07-02 LAB — PROTIME-INR
INR: 1.34
PROTHROMBIN TIME: 16.7 s — AB (ref 11.4–15.2)

## 2016-07-02 LAB — ETHANOL: Alcohol, Ethyl (B): <5 mg/dL (ref <5)

## 2016-07-02 MED ORDER — FLECAINIDE ACETATE 50 MG PO TABS
75.0000 mg | ORAL_TABLET | Freq: Two times a day (BID) | ORAL | Status: DC
  Administered 2016-07-02 – 2016-07-07 (×10): 75 mg via ORAL
  Filled 2016-07-02 (×12): qty 2

## 2016-07-02 MED ORDER — SODIUM CHLORIDE 0.9 % IV SOLN
INTRAVENOUS | Status: DC
  Administered 2016-07-03: 02:00:00 via INTRAVENOUS

## 2016-07-02 MED ORDER — LEVOTHYROXINE SODIUM 88 MCG PO TABS
88.0000 ug | ORAL_TABLET | Freq: Every day | ORAL | Status: DC
  Administered 2016-07-03 – 2016-07-07 (×5): 88 ug via ORAL
  Filled 2016-07-02 (×5): qty 1

## 2016-07-02 MED ORDER — LISINOPRIL 10 MG PO TABS
10.0000 mg | ORAL_TABLET | Freq: Two times a day (BID) | ORAL | Status: DC
  Administered 2016-07-02 – 2016-07-07 (×10): 10 mg via ORAL
  Filled 2016-07-02 (×10): qty 1

## 2016-07-02 MED ORDER — IOPAMIDOL (ISOVUE-300) INJECTION 61%
INTRAVENOUS | Status: AC
  Administered 2016-07-02: 80 mL
  Filled 2016-07-02: qty 100

## 2016-07-02 MED ORDER — METOPROLOL SUCCINATE ER 25 MG PO TB24
25.0000 mg | ORAL_TABLET | Freq: Three times a day (TID) | ORAL | Status: DC
  Administered 2016-07-02 – 2016-07-07 (×15): 25 mg via ORAL
  Filled 2016-07-02 (×15): qty 1

## 2016-07-02 MED ORDER — ONDANSETRON HCL 4 MG PO TABS: 4.0000 mg | ORAL_TABLET | Freq: Four times a day (QID) | ORAL | Status: DC | PRN

## 2016-07-02 MED ORDER — MORPHINE SULFATE (PF) 2 MG/ML IV SOLN
2.0000 mg | INTRAVENOUS | Status: DC | PRN
Start: 2016-07-02 — End: 2016-07-04

## 2016-07-02 MED ORDER — ACETAMINOPHEN 500 MG PO TABS
1000.0000 mg | ORAL_TABLET | Freq: Once | ORAL | Status: AC
  Administered 2016-07-02: 1000 mg via ORAL
  Filled 2016-07-02: qty 2

## 2016-07-02 MED ORDER — ONDANSETRON HCL 4 MG/2ML IJ SOLN: 4.0000 mg | Freq: Four times a day (QID) | INTRAMUSCULAR | Status: DC | PRN

## 2016-07-02 NOTE — ED Triage Notes (Signed)
[address] down about 13 stairs at home; denies pre-fall symptoms, reports tripping. Presents with right shoulder pain, right back pain, hematoma to left forehead. On Eliquis. Ambulatory after fall; got up and sat on couch prior to calling EMS. Arrives in c-collar. VSS. CBG 118.

## 2016-07-02 NOTE — ED Notes (Signed)
Pt was taken to CT

## 2016-07-02 NOTE — ED Notes (Signed)
Patient transported to X-ray 

## 2016-07-02 NOTE — H&P (Signed)
History   Jessica Myers is an 82 y.o. female.   Chief Complaint:  Chief Complaint  Patient presents with  . Fall    HPI This is an 82 year old female with multiple medical problems anticoagulated on Eliquis who presents after falling down approximately 13 stairs.  The patient denies any presyncopal events. She states that she just tripped and fell. The stairs were carpeted. She denies any loss of consciousness. There is no seizure activity noted. She complained of pain to her right shoulder and upper thoracic area. She has a hematoma on her left forehead.  She was evaluated by the emergency department with plain films and a CT scan of the head and neck. This showed no intracranial injury. There was a question of bilateral first rib fractures on the lower scans of the neck. Therefore a CT scan of the chest abdomen and pelvis was ordered. We are now asked to admit the patient for pain management and pulmonary toilet.  Past Medical History:  Diagnosis Date  . A-fib ([place])   . Anxiety   . Bradycardia    s/p PPM October 2014  . Chronic anticoagulation   . Headache(784.0)   . High cholesterol   . Hypertension   . Stroke [facility]) September 2014   thrombolytic therapy  . Thyroid disease   . Vertical vertigo     Past Surgical History:  Procedure Laterality Date  . CHOLECYSTECTOMY    . PACEMAKER INSERTION    . PERMANENT PACEMAKER INSERTION N/A 07/25/2013   Procedure: PERMANENT PACEMAKER INSERTION;  Surgeon: Deboraha Sprang, MD;  Location: [facility] CATH LAB;  Service: Cardiovascular;  Laterality: N/A;  . TEE WITHOUT CARDIOVERSION N/A 07/16/2013   Procedure: TRANSESOPHAGEAL ECHOCARDIOGRAM (TEE);  Surgeon: Thayer Headings, MD;  Location: [facility] ENDOSCOPY;  Service: Cardiovascular;  Laterality: N/A;    Family History  Problem Relation Age of Onset  . Cancer Mother   . Cancer Father    Social History:  reports that she has never smoked. She has never used smokeless tobacco. She reports that she does not  drink alcohol or use drugs.  Allergies   Allergies  Allergen Reactions  . Biaxin [Clarithromycin] Shortness Of Breath and Rash    Unknown  . Penicillins Hives    Has patient had a PCN reaction causing immediate rash, facial/tongue/throat swelling, SOB or lightheadedness with hypotension: Yes Has patient had a PCN reaction causing severe rash involving mucus membranes or skin necrosis: No Has patient had a PCN reaction that required hospitalization No Has patient had a PCN reaction occurring within the last 10 years: No If all of the above answers are "NO", then may proceed with Cephalosporin use.    . Shellfish Allergy Nausea And Vomiting  . Sulfa Antibiotics Other (See Comments)    "bad reaction," per patient  . Celebrex [Celecoxib] Rash    Home Medications   Prior to Admission medications   Medication Sig Start Date End Date Taking? Authorizing Provider  acetaminophen (TYLENOL) 500 MG tablet Take 500-1,000 mg by mouth every 6 (six) hours as needed for mild pain.   Yes Historical Provider, MD  ALPRAZolam Duanne Moron) 0.5 MG tablet Take 0.125 mg by mouth daily as needed for sleep or anxiety.    Yes Historical Provider, MD  apixaban (ELIQUIS) 5 MG TABS tablet Take 1 tablet (5 mg total) by mouth 2 (two) times daily. 11/13/15  Yes Evans Lance, MD  atorvastatin (LIPITOR) 10 MG tablet Take 10 mg by mouth daily.  Yes Historical Provider, MD  cholecalciferol (VITAMIN D) 1000 UNITS tablet Take 1,000 Units by mouth daily.    Yes Historical Provider, MD  flecainide (TAMBOCOR) 150 MG tablet Take 0.5 tablets (75 mg total) by mouth 2 (two) times daily. 11/13/15  Yes Evans Lance, MD  levothyroxine (SYNTHROID, LEVOTHROID) 88 MCG tablet Take 88 mcg by mouth daily before breakfast.   Yes Historical Provider, MD  lisinopril (PRINIVIL,ZESTRIL) 10 MG tablet Take 10 mg by mouth 2 (two) times daily.   Yes Historical Provider, MD  loratadine (CLARITIN) 10 MG tablet TAKE 1 TABLET (10 MG TOTAL) BY MOUTH  DAILY. Patient taking differently: Take 10 mg by mouth once a day as needed for allergies 07/08/15  Yes Evans Lance, MD  meclizine (ANTIVERT) 25 MG tablet Take 25 mg by mouth 3 (three) times daily as needed for dizziness.   Yes Historical Provider, MD  metoprolol succinate (TOPROL-XL) 25 MG 24 hr tablet TAKE 1 TABLET BY MOUTH 3 TIMES DAILY. Patient taking differently: Take 25 mg by mouth three times a day 11/30/15  Yes Evans Lance, MD  omeprazole (PRILOSEC) 20 MG capsule Take 20 mg by mouth at bedtime.    Yes Historical Provider, MD     Trauma Course   Results for orders placed or performed during the hospital encounter of 07/02/16 (from the past 48 hour(s))  CBG monitoring, ED     Status: Abnormal   Collection Time: 07/02/16  1:50 PM  Result Value Ref Range   Glucose-Capillary 102 (H) 65 - 99 mg/dL  Protime-INR     Status: Abnormal   Collection Time: 07/02/16  2:38 PM  Result Value Ref Range   Prothrombin Time 16.7 (H) 11.4 - 15.2 seconds   INR 6.78   Basic metabolic panel     Status: Abnormal   Collection Time: 07/02/16  2:38 PM  Result Value Ref Range   Sodium 138 135 - 145 mmol/L   Potassium 4.5 3.5 - 5.1 mmol/L   Chloride 105 101 - 111 mmol/L   CO2 29 22 - 32 mmol/L   Glucose, Bld 127 (H) 65 - 99 mg/dL   BUN 24 (H) 6 - 20 mg/dL   Creatinine, Ser 1.35 (H) 0.44 - 1.00 mg/dL   Calcium 9.3 8.9 - 10.3 mg/dL   GFR calc non Af Amer 35 (L) >60 mL/min   GFR calc Af Amer 40 (L) >60 mL/min    Comment: (NOTE) The eGFR has been calculated using the CKD EPI equation. This calculation has not been validated in all clinical situations. eGFR's persistently <60 mL/min signify possible Chronic Kidney Disease.    Anion gap 4 (L) 5 - 15  CBC with Differential/Platelet     Status: Abnormal   Collection Time: 07/02/16  2:38 PM  Result Value Ref Range   WBC 10.9 (H) 4.0 - 10.5 K/uL    Comment: WHITE COUNT CONFIRMED ON SMEAR   RBC 3.89 3.87 - 5.11 MIL/uL   Hemoglobin 11.8 (L) 12.0 -  15.0 g/dL   HCT 37.4 36.0 - 46.0 %   MCV 96.1 78.0 - 100.0 fL   MCH 30.3 26.0 - 34.0 pg   MCHC 31.6 30.0 - 36.0 g/dL   RDW 12.9 11.5 - 15.5 %   Platelets 169 150 - 400 K/uL   Neutrophils Relative % 84 %   Lymphocytes Relative 8 %   Monocytes Relative 7 %   Eosinophils Relative 1 %   Basophils Relative 0 %  Neutro Abs 9.1 (H) 1.7 - 7.7 K/uL   Lymphs Abs 0.9 0.7 - 4.0 K/uL   Monocytes Absolute 0.8 0.1 - 1.0 K/uL   Eosinophils Absolute 0.1 0.0 - 0.7 K/uL   Basophils Absolute 0.0 0.0 - 0.1 K/uL   Smear Review MORPHOLOGY UNREMARKABLE   Ethanol     Status: None   Collection Time: 07/02/16  4:22 PM  Result Value Ref Range   Alcohol, Ethyl (B) <5 <5 mg/dL    Comment:        LOWEST DETECTABLE LIMIT FOR SERUM ALCOHOL IS 5 mg/dL FOR MEDICAL PURPOSES ONLY   I-Stat CG4 Lactic Acid, ED     Status: None   Collection Time: 07/02/16  4:38 PM  Result Value Ref Range   Lactic Acid, Venous 1.68 0.5 - 1.9 mmol/L   Dg Chest 2 View  Result Date: 07/02/2016 CLINICAL DATA:  Fall several hours ago down her basement stairs; Large bruise to forehead; Painful right shoulder; Pain in mid back EXAM: CHEST  2 VIEW COMPARISON:  None. FINDINGS: Cardiac silhouette is top-normal in size. No mediastinal or hilar masses or evidence of adenopathy. Left anterior chest wall sequential pacemaker is well positioned. Clear lungs.  No pleural effusion or pneumothorax. Bony thorax is demineralized. There is a mild compression deformity of a mid thoracic vertebra. IMPRESSION: 1. No acute cardiopulmonary disease. 2. Mild compression fracture of a mid thoracic vertebrae of unclear chronicity. Electronically Signed   By: Lajean Manes M.D.   On: 07/02/2016 15:25   Dg Thoracic Spine 2 View  Result Date: 07/02/2016 CLINICAL DATA:  Fall several hours ago down her basement stairs; Large bruise to forehead; Painful right shoulder; Pain in mid back EXAM: THORACIC SPINE 2 VIEWS COMPARISON:  None. FINDINGS: Compression deformity in  the mid thoracic spine (T7) with approximately 30% loss vertebral body height anteriorly is indeterminate age. No retropulsion. No additional evidence of fracture. IMPRESSION: Age-indeterminate mild compression deformity at T7. Electronically Signed   By: Suzy Bouchard M.D.   On: 07/02/2016 15:26   Dg Shoulder Right  Result Date: 07/02/2016 CLINICAL DATA:  Fall several hours ago down her basement stairs; Large bruise to forehead; Painful right shoulder; Pain in mid back EXAM: RIGHT SHOULDER - 2+ VIEW COMPARISON:  None. FINDINGS: No fracture or dislocation. Bones are demineralized. No significant arthropathic change. Soft tissues are unremarkable. IMPRESSION: No fracture or dislocation. Electronically Signed   By: Lajean Manes M.D.   On: 07/02/2016 15:24   Ct Head Wo Contrast  Result Date: 07/02/2016 CLINICAL DATA:  Patient status post fall down the stairs. Positive reported loss of consciousness. Soft tissue swelling and hematoma overlying the left frontal calvarium. EXAM: CT HEAD WITHOUT CONTRAST CT CERVICAL SPINE WITHOUT CONTRAST TECHNIQUE: Multidetector CT imaging of the head and cervical spine was performed following the standard protocol without intravenous contrast. Multiplanar CT image reconstructions of the cervical spine were also generated. COMPARISON:  Brain CT 04/12/2015. FINDINGS: CT HEAD FINDINGS Brain: Ventricles and sulci are appropriate for patient's age. Periventricular and subcortical white matter hypodensity compatible with chronic microvascular ischemic changes. Old right basal ganglia infarct. No evidence for acute cortically based infarct, intracranial hemorrhage, mass lesion or mass-effect. Vascular: Unremarkable. Skull: Intact. Sinuses/Orbits: Paranasal sinuses and mastoid air cells are well aerated. Orbits are unremarkable. Other: Soft tissue swelling overlying the left frontal calvarium. CT CERVICAL SPINE FINDINGS Normal anatomic alignment. Multilevel degenerative disc disease  most pronounced C4-5, C5-6 and C6-7. Craniocervical junction is intact. No acute cervical spine  fracture. Prevertebral soft tissues unremarkable. Right greater than left apical pleural parenchymal thickening. Nondisplaced left first rib fracture. This is best demonstrated on image 54; series 7. Nondisplaced rib fractures through the posterior medial right first and third ribs (image 45; series 6) and (image 62; series 6). Mildly displaced fracture through the posterior medial right second rib (image 53; series 6). Possible nondisplaced fracture through the medial left fourth rib (image 256; series 5). IMPRESSION: Bilateral rib fractures involving the posterior medial right first, second and third ribs. Nondisplaced fracture through the posterior left first rib. Possible nondisplaced fracture through the medial left fourth rib. No acute intracranial process. Old right basal ganglia lacunar infarct. No acute cervical spine fracture. Soft tissue swelling overlying the left frontal calvarium without underlying skull fracture. Electronically Signed   By: Lovey Newcomer M.D.   On: 07/02/2016 16:03   Ct Chest W Contrast  Result Date: 07/02/2016 CLINICAL DATA:  Fall down 13 stairs with rib pain and forehead hematoma. EXAM: CT CHEST, ABDOMEN, AND PELVIS WITH CONTRAST TECHNIQUE: Multidetector CT imaging of the chest, abdomen and pelvis was performed following the standard protocol during bolus administration of intravenous contrast. CONTRAST:  93m ISOVUE-300 IOPAMIDOL (ISOVUE-300) INJECTION 61% COMPARISON:  None. FINDINGS: CT CHEST FINDINGS Cardiovascular: Pacemaker over the left chest wall. There is mild cardiomegaly. Mild calcified plaque over the left anterior descending coronary artery. Calcified plaque over the thoracic aorta. Remaining vascular structures are within normal. Mediastinum/Nodes: No evidence of mediastinal or hilar adenopathy. Remaining mediastinal structures are within normal. Lungs/Pleura: Lungs are  well inflated with mild bibasilar dependent atelectasis. Tiny sliver of a pneumothorax over the anterior right midlung tiny adjacent contusion. Possible tiny amount of air just anterior to the heart. Adjacent Airways are normal. Musculoskeletal: Subtle fractures of the posterior medial aspect at the costovertebral junction of the right second and third ribs. There is a moderate T4 compression fracture age indeterminate. There is also a mild compression deformity of T7 age indeterminate. There are degenerative changes of the spine. CT ABDOMEN PELVIS FINDINGS Hepatobiliary: Previous cholecystectomy. Liver and biliary tree are otherwise within normal. Pancreas: Within normal. Spleen: Within normal. Adrenals/Urinary Tract: Adrenal glands are normal. Atrophic left kidney with a couple simple cysts over the upper pole. Right kidney is normal in size without hydronephrosis. There is a 4 mm stone over the posterior mid to upper pole. There are multiple right renal cysts. Bladder is unremarkable. Visualized ureters are unremarkable. Stomach/Bowel: Stomach and small bowel are within normal. Appendix is normal. Colon is unremarkable. There is moderate fecal retention throughout the colon. Vascular/Lymphatic: There is calcified plaque over the abdominal aorta and iliac arteries. There is no evidence adenopathy. Reproductive: Within normal. Other: Minimal free pelvic fluid. No focal inflammatory change. No free peritoneal air. Musculoskeletal: Degenerative change of the spine. IMPRESSION: Tiny sliver of a right pneumothorax over the anterior mid lung with small associated pulmonary contusion. Fractures of the posterior medial costovertebral junction of the right second and third ribs. No acute findings in the abdomen/ pelvis. Mild cardiomegaly and minimal atherosclerotic coronary artery disease. Aortic atherosclerosis. Atrophic left kidney.  Bilateral renal cysts. Small amount free fluid in the pelvis. Compression fractures of  T4 and T7 age indeterminate but likely chronic. Critical Value/emergent results were called by telephone at the time of interpretation on 07/02/2016 at 6:42 pm to Dr. MMalvin Johns who verbally acknowledged these results. Electronically Signed   By: DMarin OlpM.D.   On: 07/02/2016 18:41   Ct Cervical Spine Wo  Contrast  Result Date: 07/02/2016 CLINICAL DATA:  Patient status post fall down the stairs. Positive reported loss of consciousness. Soft tissue swelling and hematoma overlying the left frontal calvarium. EXAM: CT HEAD WITHOUT CONTRAST CT CERVICAL SPINE WITHOUT CONTRAST TECHNIQUE: Multidetector CT imaging of the head and cervical spine was performed following the standard protocol without intravenous contrast. Multiplanar CT image reconstructions of the cervical spine were also generated. COMPARISON:  Brain CT 04/12/2015. FINDINGS: CT HEAD FINDINGS Brain: Ventricles and sulci are appropriate for patient's age. Periventricular and subcortical white matter hypodensity compatible with chronic microvascular ischemic changes. Old right basal ganglia infarct. No evidence for acute cortically based infarct, intracranial hemorrhage, mass lesion or mass-effect. Vascular: Unremarkable. Skull: Intact. Sinuses/Orbits: Paranasal sinuses and mastoid air cells are well aerated. Orbits are unremarkable. Other: Soft tissue swelling overlying the left frontal calvarium. CT CERVICAL SPINE FINDINGS Normal anatomic alignment. Multilevel degenerative disc disease most pronounced C4-5, C5-6 and C6-7. Craniocervical junction is intact. No acute cervical spine fracture. Prevertebral soft tissues unremarkable. Right greater than left apical pleural parenchymal thickening. Nondisplaced left first rib fracture. This is best demonstrated on image 54; series 7. Nondisplaced rib fractures through the posterior medial right first and third ribs (image 45; series 6) and (image 62; series 6). Mildly displaced fracture through the  posterior medial right second rib (image 53; series 6). Possible nondisplaced fracture through the medial left fourth rib (image 256; series 5). IMPRESSION: Bilateral rib fractures involving the posterior medial right first, second and third ribs. Nondisplaced fracture through the posterior left first rib. Possible nondisplaced fracture through the medial left fourth rib. No acute intracranial process. Old right basal ganglia lacunar infarct. No acute cervical spine fracture. Soft tissue swelling overlying the left frontal calvarium without underlying skull fracture. Electronically Signed   By: Lovey Newcomer M.D.   On: 07/02/2016 16:03   Ct Abdomen Pelvis W Contrast  Result Date: 07/02/2016 CLINICAL DATA:  Fall down 13 stairs with rib pain and forehead hematoma. EXAM: CT CHEST, ABDOMEN, AND PELVIS WITH CONTRAST TECHNIQUE: Multidetector CT imaging of the chest, abdomen and pelvis was performed following the standard protocol during bolus administration of intravenous contrast. CONTRAST:  44m ISOVUE-300 IOPAMIDOL (ISOVUE-300) INJECTION 61% COMPARISON:  None. FINDINGS: CT CHEST FINDINGS Cardiovascular: Pacemaker over the left chest wall. There is mild cardiomegaly. Mild calcified plaque over the left anterior descending coronary artery. Calcified plaque over the thoracic aorta. Remaining vascular structures are within normal. Mediastinum/Nodes: No evidence of mediastinal or hilar adenopathy. Remaining mediastinal structures are within normal. Lungs/Pleura: Lungs are well inflated with mild bibasilar dependent atelectasis. Tiny sliver of a pneumothorax over the anterior right midlung tiny adjacent contusion. Possible tiny amount of air just anterior to the heart. Adjacent Airways are normal. Musculoskeletal: Subtle fractures of the posterior medial aspect at the costovertebral junction of the right second and third ribs. There is a moderate T4 compression fracture age indeterminate. There is also a mild compression  deformity of T7 age indeterminate. There are degenerative changes of the spine. CT ABDOMEN PELVIS FINDINGS Hepatobiliary: Previous cholecystectomy. Liver and biliary tree are otherwise within normal. Pancreas: Within normal. Spleen: Within normal. Adrenals/Urinary Tract: Adrenal glands are normal. Atrophic left kidney with a couple simple cysts over the upper pole. Right kidney is normal in size without hydronephrosis. There is a 4 mm stone over the posterior mid to upper pole. There are multiple right renal cysts. Bladder is unremarkable. Visualized ureters are unremarkable. Stomach/Bowel: Stomach and small bowel are within normal. Appendix is  normal. Colon is unremarkable. There is moderate fecal retention throughout the colon. Vascular/Lymphatic: There is calcified plaque over the abdominal aorta and iliac arteries. There is no evidence adenopathy. Reproductive: Within normal. Other: Minimal free pelvic fluid. No focal inflammatory change. No free peritoneal air. Musculoskeletal: Degenerative change of the spine. IMPRESSION: Tiny sliver of a right pneumothorax over the anterior mid lung with small associated pulmonary contusion. Fractures of the posterior medial costovertebral junction of the right second and third ribs. No acute findings in the abdomen/ pelvis. Mild cardiomegaly and minimal atherosclerotic coronary artery disease. Aortic atherosclerosis. Atrophic left kidney.  Bilateral renal cysts. Small amount free fluid in the pelvis. Compression fractures of T4 and T7 age indeterminate but likely chronic. Critical Value/emergent results were called by telephone at the time of interpretation on 07/02/2016 at 6:42 pm to Dr. Malvin Johns, who verbally acknowledged these results. Electronically Signed   By: Marin Olp M.D.   On: 07/02/2016 18:41   Dg Hand Complete Left  Result Date: 07/02/2016 CLINICAL DATA:  Left hand pain with swelling and bruising posteriorly. Fall. EXAM: LEFT HAND - COMPLETE 3+ VIEW  COMPARISON:  None. FINDINGS: Mild degenerative change over the radiocarpal joint and carpal bones. Minimal degenerative change of the interphalangeal joints. Mild diffuse osteopenia. There is a minimally displaced fracture of the first distal phalanx. IMPRESSION: Minimally displaced fracture of the first distal phalanx. Electronically Signed   By: Marin Olp M.D.   On: 07/02/2016 21:13    Review of Systems  Constitutional: Negative for weight loss.  HENT: Negative for ear discharge, ear pain, hearing loss and tinnitus.   Eyes: Negative for blurred vision, double vision, photophobia and pain.  Respiratory: Positive for shortness of breath. Negative for cough and sputum production.   Cardiovascular: Positive for chest pain.  Gastrointestinal: Negative for abdominal pain, nausea and vomiting.  Genitourinary: Negative for dysuria, flank pain, frequency and urgency.  Musculoskeletal: Positive for back pain and falls. Negative for joint pain, myalgias and neck pain.  Neurological: Negative for dizziness, tingling, sensory change, focal weakness, loss of consciousness and headaches.  Endo/Heme/Allergies: Does not bruise/bleed easily.  Psychiatric/Behavioral: Negative for depression, memory loss and substance abuse. The patient is not nervous/anxious.     Blood pressure 135/77, pulse 68, temperature 98 F (36.7 C), temperature source Oral, resp. rate 16, SpO2 97 %. Physical Exam  Vitals reviewed. Constitutional: She is oriented to person, place, and time. She appears well-developed and well-nourished. She is cooperative. No distress. Cervical collar and nasal cannula in place.  HENT:  Head: Normocephalic. Head is without raccoon's eyes, without Battle's sign, without abrasion, without contusion and without laceration.  Right Ear: Hearing, tympanic membrane, external ear and ear canal normal. No lacerations. No drainage or tenderness. No foreign bodies. Tympanic membrane is not perforated. No  hemotympanum.  Left Ear: Hearing, tympanic membrane, external ear and ear canal normal. No lacerations. No drainage or tenderness. No foreign bodies. Tympanic membrane is not perforated. No hemotympanum.  Nose: Nose normal. No nose lacerations, sinus tenderness, nasal deformity or nasal septal hematoma. No epistaxis.  Mouth/Throat: Uvula is midline, oropharynx is clear and moist and mucous membranes are normal. No lacerations.  Left forehead hematoma/ contusion  Eyes: Conjunctivae, EOM and lids are normal. Pupils are equal, round, and reactive to light. No scleral icterus.  Neck: Trachea normal. No JVD present. No spinous process tenderness and no muscular tenderness present. Carotid bruit is not present. No thyromegaly present.  Cardiovascular: Normal rate, regular rhythm, normal heart  sounds, intact distal pulses and normal pulses.   Respiratory: Effort normal and breath sounds normal. No respiratory distress. She exhibits no tenderness, no bony tenderness, no laceration and no crepitus.  Tender right posterior upper ribs/ shoulder  GI: Soft. Normal appearance and bowel sounds are normal. She exhibits no distension. There is no tenderness. There is no rigidity, no rebound, no guarding and no CVA tenderness.  Musculoskeletal: Normal range of motion. She exhibits no edema or tenderness.  Bruising tenderness left hand  Lymphadenopathy:    She has no cervical adenopathy.  Neurological: She is alert and oriented to person, place, and time. She has normal strength. No cranial nerve deficit or sensory deficit. GCS eye subscore is 4. GCS verbal subscore is 5. GCS motor subscore is 6.  Skin: Skin is warm, dry and intact. She is not diaphoretic.  Psychiatric: She has a normal mood and affect. Her speech is normal and behavior is normal.     Assessment/Plan 1.  Fall down stairs 2.  Anticoagulated 3.  Left forehead contusion 4.  Right occult pneumothorax with pulmonary contusion 5.  Fracture right 2-3  ribs at posteromedial costovertebral junction 6.  T4T7 compression fractures - likely chronic 7.  Left first distal phalanx fracture  Admit to Stepdown Oxygen Pulmonary toilet - incentive spirometer Pain control Discussed the serious risk of pneumonia and other sequelae with rib fractures in the elderly.  Encouraged the family to have a discussion regarding code status in case her condition deteriorates.  Currently she appears stable Will admit to Step-down unit  Zeynab Klett K. 07/02/2016, 10:35 PM   Procedures

## 2016-07-02 NOTE — ED Notes (Signed)
Patient transported to Radiology 

## 2016-07-02 NOTE — ED Notes (Signed)
This NT and Estill Batten, RN removed pt c-collar per verbal order Audie Pinto, MD

## 2016-07-02 NOTE — ED Provider Notes (Signed)
[facility] DEPT Provider Note   CSN: [id number] Arrival date & time: 07/02/16  1342     History   Chief Complaint Chief Complaint  Patient presents with  . Fall    HPI Jessica Myers is a 82 y.o. female.  HPI Patient presents via EMS after accidentally falling down stairs at home.  Stairs were carpeted.  She did not have any presyncopal or dizziness she says she just tripped.  She was awake alert immediately after the event.  She has chief complaint of pain to her right shoulder and upper thoracic area.  She also has a hematoma to her left 4 head.  She currently takes Elocon was because of atrial fibrillation.  Denies chest pain.  Other than her shoulder and back area she has no complaints. Past Medical History:  Diagnosis Date  . A-fib ([place])   . Anxiety   . Bradycardia    s/p PPM October 2014  . Chronic anticoagulation   . Headache(784.0)   . High cholesterol   . Hypertension   . Stroke [facility]) September 2014   thrombolytic therapy  . Thyroid disease   . Vertical vertigo     Patient Active Problem List   Diagnosis Date Noted  . Multiple rib fractures 07/02/2016  . Musculoskeletal arm pain 01/03/2014  . Hoarseness, persistent 01/03/2014  . Pacemaker 10/24/2013  . Atrial fibrillation ([place]) 10/24/2013  . Syncope 07/25/2013  . Stroke ([place]) 07/16/2013  . Acute right MCA stroke ([place]) 07/16/2013  . Acute left hemiparesis ([place]) 07/16/2013  . Accelerated hypertension 07/16/2013  . Dyslipidemia, goal LDL below 100 07/16/2013  . Dysarthria as late effect of stroke 07/16/2013  . Unspecified hypothyroidism 07/16/2013  . Encounter for long-term (current) use of medications 07/16/2013    Past Surgical History:  Procedure Laterality Date  . CHOLECYSTECTOMY    . PACEMAKER INSERTION    . PERMANENT PACEMAKER INSERTION N/A 07/25/2013   Procedure: PERMANENT PACEMAKER INSERTION;  Surgeon: Deboraha Sprang, MD;  Location: [facility] CATH LAB;  Service: Cardiovascular;  Laterality: N/A;    . TEE WITHOUT CARDIOVERSION N/A 07/16/2013   Procedure: TRANSESOPHAGEAL ECHOCARDIOGRAM (TEE);  Surgeon: Thayer Headings, MD;  Location: [facility] ENDOSCOPY;  Service: Cardiovascular;  Laterality: N/A;    OB History    Gravida Para Term Preterm AB Living   2 2 2     2    SAB TAB Ectopic Multiple Live Births                   Home Medications    Prior to Admission medications   Medication Sig Start Date End Date Taking? Authorizing Provider  acetaminophen (TYLENOL) 500 MG tablet Take 500-1,000 mg by mouth every 6 (six) hours as needed for mild pain.   Yes Historical Provider, MD  ALPRAZolam Duanne Moron) 0.5 MG tablet Take 0.125 mg by mouth daily as needed for sleep or anxiety.    Yes Historical Provider, MD  apixaban (ELIQUIS) 5 MG TABS tablet Take 1 tablet (5 mg total) by mouth 2 (two) times daily. 11/13/15  Yes Evans Lance, MD  atorvastatin (LIPITOR) 10 MG tablet Take 10 mg by mouth daily.   Yes Historical Provider, MD  cholecalciferol (VITAMIN D) 1000 UNITS tablet Take 1,000 Units by mouth daily.    Yes Historical Provider, MD  flecainide (TAMBOCOR) 150 MG tablet Take 0.5 tablets (75 mg total) by mouth 2 (two) times daily. 11/13/15  Yes Evans Lance, MD  levothyroxine (SYNTHROID, LEVOTHROID) 88 MCG tablet Take  88 mcg by mouth daily before breakfast.   Yes Historical Provider, MD  lisinopril (PRINIVIL,ZESTRIL) 10 MG tablet Take 10 mg by mouth 2 (two) times daily.   Yes Historical Provider, MD  loratadine (CLARITIN) 10 MG tablet TAKE 1 TABLET (10 MG TOTAL) BY MOUTH DAILY. Patient taking differently: Take 10 mg by mouth once a day as needed for allergies 07/08/15  Yes Evans Lance, MD  meclizine (ANTIVERT) 25 MG tablet Take 25 mg by mouth 3 (three) times daily as needed for dizziness.   Yes Historical Provider, MD  metoprolol succinate (TOPROL-XL) 25 MG 24 hr tablet TAKE 1 TABLET BY MOUTH 3 TIMES DAILY. Patient taking differently: Take 25 mg by mouth three times a day 11/30/15  Yes Evans Lance,  MD  omeprazole (PRILOSEC) 20 MG capsule Take 20 mg by mouth at bedtime.    Yes Historical Provider, MD    Family History Family History  Problem Relation Age of Onset  . Cancer Mother   . Cancer Father     Social History Social History  Substance Use Topics  . Smoking status: Never Smoker  . Smokeless tobacco: Never Used  . Alcohol use No     Allergies   Biaxin [clarithromycin]; Penicillins; Shellfish allergy; Sulfa antibiotics; and Celebrex [celecoxib]   Review of Systems Review of Systems All other systems reviewed and are negative  Physical Exam Updated Vital Signs BP 137/65 (BP Location: Right Arm)   Pulse 63   Temp 98.1 F (36.7 C) (Oral)   Resp 19   SpO2 95%   Physical Exam Physical Exam  Nursing note and vitals reviewed. Constitutional: She is oriented to person, place, and time. She appears well-developed and well-nourished. No distress.  HENT:  Head: Normocephalic.  She has a bruise and hematoma to the left 4 head area..  Eyes: Pupils are equal, round, and reactive to light.  Neck: She is currently in a cervical collar.   Cardiovascular: Normal rate and intact distal pulses.   Pulmonary/Chest: No respiratory distress.  Abdominal: Normal appearance. She exhibits no distension.  Soft and nontender to palpation.   Musculoskeletal: Normal range of motion.  She has tenderness to the mid to upper thoracic  Neurological: She is alert and oriented to person, place, and time. No cranial nerve deficit.  she is no sensory deficit.  No motor deficit. Skin: Skin is warm and dry. No rash noted.  Psychiatric: She has a normal mood and affect. Her behavior is normal.    ED Treatments / Results  Labs (all labs ordered are listed, but only abnormal results are displayed) Labs Reviewed  PROTIME-INR - Abnormal; Notable for the following:       Result Value   Prothrombin Time 16.7 (*)    All other components within normal limits  BASIC METABOLIC PANEL - Abnormal;  Notable for the following:    Glucose, Bld 127 (*)    BUN 24 (*)    Creatinine, Ser 1.35 (*)    GFR calc non Af Amer 35 (*)    GFR calc Af Amer 40 (*)    Anion gap 4 (*)    All other components within normal limits  CBC WITH DIFFERENTIAL/PLATELET - Abnormal; Notable for the following:    WBC 10.9 (*)    Hemoglobin 11.8 (*)    Neutro Abs 9.1 (*)    All other components within normal limits  CBC - Abnormal; Notable for the following:    RBC 3.51 (*)  Hemoglobin 10.4 (*)    HCT 33.9 (*)    Platelets 148 (*)    All other components within normal limits  COMPREHENSIVE METABOLIC PANEL - Abnormal; Notable for the following:    Chloride 100 (*)    Glucose, Bld 133 (*)    BUN 21 (*)    Creatinine, Ser 1.24 (*)    Total Protein 6.2 (*)    Albumin 3.3 (*)    GFR calc non Af Amer 38 (*)    GFR calc Af Amer 45 (*)    All other components within normal limits  CBG MONITORING, ED - Abnormal; Notable for the following:    Glucose-Capillary 102 (*)    All other components within normal limits  MRSA PCR SCREENING  ETHANOL  I-STAT CG4 LACTIC ACID, ED    EKG  EKG Interpretation  Date/Time:  Saturday July 02 2016 13:49:10 EDT Ventricular Rate:  64 PR Interval:    QRS Duration: 95 QT Interval:  423 QTC Calculation: 437 R Axis:   47 Text Interpretation:  Sinus rhythm Prolonged PR interval Confirmed by Audie Pinto  MD, Inger Wiest ([id number]) on 07/02/2016 2:20:26 PM       Radiology Dg Chest 2 View  Result Date: 07/02/2016 CLINICAL DATA:  Fall several hours ago down her basement stairs; Large bruise to forehead; Painful right shoulder; Pain in mid back EXAM: CHEST  2 VIEW COMPARISON:  None. FINDINGS: Cardiac silhouette is top-normal in size. No mediastinal or hilar masses or evidence of adenopathy. Left anterior chest wall sequential pacemaker is well positioned. Clear lungs.  No pleural effusion or pneumothorax. Bony thorax is demineralized. There is a mild compression deformity of a mid  thoracic vertebra. IMPRESSION: 1. No acute cardiopulmonary disease. 2. Mild compression fracture of a mid thoracic vertebrae of unclear chronicity. Electronically Signed   By: Lajean Manes M.D.   On: 07/02/2016 15:25   Dg Thoracic Spine 2 View  Result Date: 07/02/2016 CLINICAL DATA:  Fall several hours ago down her basement stairs; Large bruise to forehead; Painful right shoulder; Pain in mid back EXAM: THORACIC SPINE 2 VIEWS COMPARISON:  None. FINDINGS: Compression deformity in the mid thoracic spine (T7) with approximately 30% loss vertebral body height anteriorly is indeterminate age. No retropulsion. No additional evidence of fracture. IMPRESSION: Age-indeterminate mild compression deformity at T7. Electronically Signed   By: Suzy Bouchard M.D.   On: 07/02/2016 15:26   Dg Shoulder Right  Result Date: 07/02/2016 CLINICAL DATA:  Fall several hours ago down her basement stairs; Large bruise to forehead; Painful right shoulder; Pain in mid back EXAM: RIGHT SHOULDER - 2+ VIEW COMPARISON:  None. FINDINGS: No fracture or dislocation. Bones are demineralized. No significant arthropathic change. Soft tissues are unremarkable. IMPRESSION: No fracture or dislocation. Electronically Signed   By: Lajean Manes M.D.   On: 07/02/2016 15:24   Ct Head Wo Contrast  Result Date: 07/02/2016 CLINICAL DATA:  Patient status post fall down the stairs. Positive reported loss of consciousness. Soft tissue swelling and hematoma overlying the left frontal calvarium. EXAM: CT HEAD WITHOUT CONTRAST CT CERVICAL SPINE WITHOUT CONTRAST TECHNIQUE: Multidetector CT imaging of the head and cervical spine was performed following the standard protocol without intravenous contrast. Multiplanar CT image reconstructions of the cervical spine were also generated. COMPARISON:  Brain CT 04/12/2015. FINDINGS: CT HEAD FINDINGS Brain: Ventricles and sulci are appropriate for patient's age. Periventricular and subcortical white matter  hypodensity compatible with chronic microvascular ischemic changes. Old right basal ganglia infarct. No evidence  for acute cortically based infarct, intracranial hemorrhage, mass lesion or mass-effect. Vascular: Unremarkable. Skull: Intact. Sinuses/Orbits: Paranasal sinuses and mastoid air cells are well aerated. Orbits are unremarkable. Other: Soft tissue swelling overlying the left frontal calvarium. CT CERVICAL SPINE FINDINGS Normal anatomic alignment. Multilevel degenerative disc disease most pronounced C4-5, C5-6 and C6-7. Craniocervical junction is intact. No acute cervical spine fracture. Prevertebral soft tissues unremarkable. Right greater than left apical pleural parenchymal thickening. Nondisplaced left first rib fracture. This is best demonstrated on image 54; series 7. Nondisplaced rib fractures through the posterior medial right first and third ribs (image 45; series 6) and (image 62; series 6). Mildly displaced fracture through the posterior medial right second rib (image 53; series 6). Possible nondisplaced fracture through the medial left fourth rib (image 256; series 5). IMPRESSION: Bilateral rib fractures involving the posterior medial right first, second and third ribs. Nondisplaced fracture through the posterior left first rib. Possible nondisplaced fracture through the medial left fourth rib. No acute intracranial process. Old right basal ganglia lacunar infarct. No acute cervical spine fracture. Soft tissue swelling overlying the left frontal calvarium without underlying skull fracture. Electronically Signed   By: Lovey Newcomer M.D.   On: 07/02/2016 16:03   Ct Chest W Contrast  Result Date: 07/02/2016 CLINICAL DATA:  Fall down 13 stairs with rib pain and forehead hematoma. EXAM: CT CHEST, ABDOMEN, AND PELVIS WITH CONTRAST TECHNIQUE: Multidetector CT imaging of the chest, abdomen and pelvis was performed following the standard protocol during bolus administration of intravenous contrast.  CONTRAST:  17mL ISOVUE-300 IOPAMIDOL (ISOVUE-300) INJECTION 61% COMPARISON:  None. FINDINGS: CT CHEST FINDINGS Cardiovascular: Pacemaker over the left chest wall. There is mild cardiomegaly. Mild calcified plaque over the left anterior descending coronary artery. Calcified plaque over the thoracic aorta. Remaining vascular structures are within normal. Mediastinum/Nodes: No evidence of mediastinal or hilar adenopathy. Remaining mediastinal structures are within normal. Lungs/Pleura: Lungs are well inflated with mild bibasilar dependent atelectasis. Tiny sliver of a pneumothorax over the anterior right midlung tiny adjacent contusion. Possible tiny amount of air just anterior to the heart. Adjacent Airways are normal. Musculoskeletal: Subtle fractures of the posterior medial aspect at the costovertebral junction of the right second and third ribs. There is a moderate T4 compression fracture age indeterminate. There is also a mild compression deformity of T7 age indeterminate. There are degenerative changes of the spine. CT ABDOMEN PELVIS FINDINGS Hepatobiliary: Previous cholecystectomy. Liver and biliary tree are otherwise within normal. Pancreas: Within normal. Spleen: Within normal. Adrenals/Urinary Tract: Adrenal glands are normal. Atrophic left kidney with a couple simple cysts over the upper pole. Right kidney is normal in size without hydronephrosis. There is a 4 mm stone over the posterior mid to upper pole. There are multiple right renal cysts. Bladder is unremarkable. Visualized ureters are unremarkable. Stomach/Bowel: Stomach and small bowel are within normal. Appendix is normal. Colon is unremarkable. There is moderate fecal retention throughout the colon. Vascular/Lymphatic: There is calcified plaque over the abdominal aorta and iliac arteries. There is no evidence adenopathy. Reproductive: Within normal. Other: Minimal free pelvic fluid. No focal inflammatory change. No free peritoneal air.  Musculoskeletal: Degenerative change of the spine. IMPRESSION: Tiny sliver of a right pneumothorax over the anterior mid lung with small associated pulmonary contusion. Fractures of the posterior medial costovertebral junction of the right second and third ribs. No acute findings in the abdomen/ pelvis. Mild cardiomegaly and minimal atherosclerotic coronary artery disease. Aortic atherosclerosis. Atrophic left kidney.  Bilateral renal cysts. Small amount  free fluid in the pelvis. Compression fractures of T4 and T7 age indeterminate but likely chronic. Critical Value/emergent results were called by telephone at the time of interpretation on 07/02/2016 at 6:42 pm to Dr. Malvin Johns, who verbally acknowledged these results. Electronically Signed   By: Marin Olp M.D.   On: 07/02/2016 18:41   Ct Cervical Spine Wo Contrast  Result Date: 07/02/2016 CLINICAL DATA:  Patient status post fall down the stairs. Positive reported loss of consciousness. Soft tissue swelling and hematoma overlying the left frontal calvarium. EXAM: CT HEAD WITHOUT CONTRAST CT CERVICAL SPINE WITHOUT CONTRAST TECHNIQUE: Multidetector CT imaging of the head and cervical spine was performed following the standard protocol without intravenous contrast. Multiplanar CT image reconstructions of the cervical spine were also generated. COMPARISON:  Brain CT 04/12/2015. FINDINGS: CT HEAD FINDINGS Brain: Ventricles and sulci are appropriate for patient's age. Periventricular and subcortical white matter hypodensity compatible with chronic microvascular ischemic changes. Old right basal ganglia infarct. No evidence for acute cortically based infarct, intracranial hemorrhage, mass lesion or mass-effect. Vascular: Unremarkable. Skull: Intact. Sinuses/Orbits: Paranasal sinuses and mastoid air cells are well aerated. Orbits are unremarkable. Other: Soft tissue swelling overlying the left frontal calvarium. CT CERVICAL SPINE FINDINGS Normal anatomic  alignment. Multilevel degenerative disc disease most pronounced C4-5, C5-6 and C6-7. Craniocervical junction is intact. No acute cervical spine fracture. Prevertebral soft tissues unremarkable. Right greater than left apical pleural parenchymal thickening. Nondisplaced left first rib fracture. This is best demonstrated on image 54; series 7. Nondisplaced rib fractures through the posterior medial right first and third ribs (image 45; series 6) and (image 62; series 6). Mildly displaced fracture through the posterior medial right second rib (image 53; series 6). Possible nondisplaced fracture through the medial left fourth rib (image 256; series 5). IMPRESSION: Bilateral rib fractures involving the posterior medial right first, second and third ribs. Nondisplaced fracture through the posterior left first rib. Possible nondisplaced fracture through the medial left fourth rib. No acute intracranial process. Old right basal ganglia lacunar infarct. No acute cervical spine fracture. Soft tissue swelling overlying the left frontal calvarium without underlying skull fracture. Electronically Signed   By: Lovey Newcomer M.D.   On: 07/02/2016 16:03   Ct Abdomen Pelvis W Contrast  Result Date: 07/02/2016 CLINICAL DATA:  Fall down 13 stairs with rib pain and forehead hematoma. EXAM: CT CHEST, ABDOMEN, AND PELVIS WITH CONTRAST TECHNIQUE: Multidetector CT imaging of the chest, abdomen and pelvis was performed following the standard protocol during bolus administration of intravenous contrast. CONTRAST:  52mL ISOVUE-300 IOPAMIDOL (ISOVUE-300) INJECTION 61% COMPARISON:  None. FINDINGS: CT CHEST FINDINGS Cardiovascular: Pacemaker over the left chest wall. There is mild cardiomegaly. Mild calcified plaque over the left anterior descending coronary artery. Calcified plaque over the thoracic aorta. Remaining vascular structures are within normal. Mediastinum/Nodes: No evidence of mediastinal or hilar adenopathy. Remaining mediastinal  structures are within normal. Lungs/Pleura: Lungs are well inflated with mild bibasilar dependent atelectasis. Tiny sliver of a pneumothorax over the anterior right midlung tiny adjacent contusion. Possible tiny amount of air just anterior to the heart. Adjacent Airways are normal. Musculoskeletal: Subtle fractures of the posterior medial aspect at the costovertebral junction of the right second and third ribs. There is a moderate T4 compression fracture age indeterminate. There is also a mild compression deformity of T7 age indeterminate. There are degenerative changes of the spine. CT ABDOMEN PELVIS FINDINGS Hepatobiliary: Previous cholecystectomy. Liver and biliary tree are otherwise within normal. Pancreas: Within normal. Spleen: Within normal.  Adrenals/Urinary Tract: Adrenal glands are normal. Atrophic left kidney with a couple simple cysts over the upper pole. Right kidney is normal in size without hydronephrosis. There is a 4 mm stone over the posterior mid to upper pole. There are multiple right renal cysts. Bladder is unremarkable. Visualized ureters are unremarkable. Stomach/Bowel: Stomach and small bowel are within normal. Appendix is normal. Colon is unremarkable. There is moderate fecal retention throughout the colon. Vascular/Lymphatic: There is calcified plaque over the abdominal aorta and iliac arteries. There is no evidence adenopathy. Reproductive: Within normal. Other: Minimal free pelvic fluid. No focal inflammatory change. No free peritoneal air. Musculoskeletal: Degenerative change of the spine. IMPRESSION: Tiny sliver of a right pneumothorax over the anterior mid lung with small associated pulmonary contusion. Fractures of the posterior medial costovertebral junction of the right second and third ribs. No acute findings in the abdomen/ pelvis. Mild cardiomegaly and minimal atherosclerotic coronary artery disease. Aortic atherosclerosis. Atrophic left kidney.  Bilateral renal cysts. Small  amount free fluid in the pelvis. Compression fractures of T4 and T7 age indeterminate but likely chronic. Critical Value/emergent results were called by telephone at the time of interpretation on 07/02/2016 at 6:42 pm to Dr. Malvin Johns, who verbally acknowledged these results. Electronically Signed   By: Marin Olp M.D.   On: 07/02/2016 18:41   Dg Hand Complete Left  Result Date: 07/02/2016 CLINICAL DATA:  Left hand pain with swelling and bruising posteriorly. Fall. EXAM: LEFT HAND - COMPLETE 3+ VIEW COMPARISON:  None. FINDINGS: Mild degenerative change over the radiocarpal joint and carpal bones. Minimal degenerative change of the interphalangeal joints. Mild diffuse osteopenia. There is a minimally displaced fracture of the first distal phalanx. IMPRESSION: Minimally displaced fracture of the first distal phalanx. Electronically Signed   By: Marin Olp M.D.   On: 07/02/2016 21:13    Procedures Procedures (including critical care time)  Medications Ordered in ED Medications  metoprolol succinate (TOPROL-XL) 24 hr tablet 25 mg (25 mg Oral Given 07/03/16 0622)  flecainide (TAMBOCOR) tablet 75 mg (75 mg Oral Given 07/03/16 0853)  levothyroxine (SYNTHROID, LEVOTHROID) tablet 88 mcg (88 mcg Oral Given 07/03/16 0853)  lisinopril (PRINIVIL,ZESTRIL) tablet 10 mg (10 mg Oral Given 07/03/16 0853)  0.9 %  sodium chloride infusion ( Intravenous New Bag/Given 07/03/16 0200)  morphine 2 MG/ML injection 2-4 mg (not administered)  ondansetron (ZOFRAN) tablet 4 mg (not administered)    Or  ondansetron (ZOFRAN) injection 4 mg (not administered)  acetaminophen (TYLENOL) tablet 1,000 mg (1,000 mg Oral Given 07/03/16 0229)  iopamidol (ISOVUE-300) 61 % injection (80 mLs  Contrast Given 07/02/16 1742)  acetaminophen (TYLENOL) tablet 1,000 mg (1,000 mg Oral Given 07/02/16 1902)     Initial Impression / Assessment and Plan / ED Course  I have reviewed the triage vital signs and the nursing notes.  Pertinent  labs & imaging results that were available during my care of the patient were reviewed by me and considered in my medical decision making (see chart for details).  Clinical Course  Discussed radiographic findings with trauma surgery.  Will plan on CT of chest and abdomen pelvis.  Trauma will see following CT scans.    Final Clinical Impressions(s) / ED Diagnoses   Final diagnoses:  Pneumothorax    New Prescriptions Current Discharge Medication List       Leonard Schwartz, MD 07/03/16 [phone]

## 2016-07-02 NOTE — ED Notes (Signed)
[employer] called and no events record and electrical activity wnl

## 2016-07-02 NOTE — Progress Notes (Signed)
Orthopedic Tech Progress Note Patient Details:  Jessica Myers 13-Oct-1929 [id number]  Ortho Devices Type of Ortho Device: Finger splint Ortho Device/Splint Location: LUE thumb Ortho Device/Splint Interventions: Ordered, Application   Braulio Bosch 07/02/2016, 9:36 PM

## 2016-07-02 NOTE — ED Notes (Signed)
Pt returned from CT °

## 2016-07-02 NOTE — ED Provider Notes (Signed)
Results for orders placed or performed during the hospital encounter of 07/02/16  Protime-INR  Result Value Ref Range   Prothrombin Time 16.7 (H) 11.4 - 15.2 seconds   INR [id number]   Basic metabolic panel  Result Value Ref Range   Sodium 138 135 - 145 mmol/L   Potassium 4.5 3.5 - 5.1 mmol/L   Chloride 105 101 - 111 mmol/L   CO2 29 22 - 32 mmol/L   Glucose, Bld 127 (H) 65 - 99 mg/dL   BUN 24 (H) 6 - 20 mg/dL   Creatinine, Ser 1.35 (H) 0.44 - 1.00 mg/dL   Calcium 9.3 8.9 - 10.3 mg/dL   GFR calc non Af Amer 35 (L) >60 mL/min   GFR calc Af Amer 40 (L) >60 mL/min   Anion gap 4 (L) 5 - 15  CBC with Differential/Platelet  Result Value Ref Range   WBC 10.9 (H) 4.0 - 10.5 K/uL   RBC 3.89 3.87 - 5.11 MIL/uL   Hemoglobin 11.8 (L) 12.0 - 15.0 g/dL   HCT 37.4 36.0 - 46.0 %   MCV 96.1 78.0 - 100.0 fL   MCH 30.3 26.0 - 34.0 pg   MCHC 31.6 30.0 - 36.0 g/dL   RDW 12.9 11.5 - 15.5 %   Platelets 169 150 - 400 K/uL   Neutrophils Relative % 84 %   Lymphocytes Relative 8 %   Monocytes Relative 7 %   Eosinophils Relative 1 %   Basophils Relative 0 %   Neutro Abs 9.1 (H) 1.7 - 7.7 K/uL   Lymphs Abs 0.9 0.7 - 4.0 K/uL   Monocytes Absolute 0.8 0.1 - 1.0 K/uL   Eosinophils Absolute 0.1 0.0 - 0.7 K/uL   Basophils Absolute 0.0 0.0 - 0.1 K/uL   Smear Review MORPHOLOGY UNREMARKABLE   Ethanol  Result Value Ref Range   Alcohol, Ethyl (B) <5 <5 mg/dL  CBG monitoring, ED  Result Value Ref Range   Glucose-Capillary 102 (H) 65 - 99 mg/dL  I-Stat CG4 Lactic Acid, ED  Result Value Ref Range   Lactic Acid, Venous 1.68 0.5 - 1.9 mmol/L   Dg Chest 2 View  Result Date: 07/02/2016 CLINICAL DATA:  Fall several hours ago down her basement stairs; Large bruise to forehead; Painful right shoulder; Pain in mid back EXAM: CHEST  2 VIEW COMPARISON:  None. FINDINGS: Cardiac silhouette is top-normal in size. No mediastinal or hilar masses or evidence of adenopathy. Left anterior chest wall sequential pacemaker is well  positioned. Clear lungs.  No pleural effusion or pneumothorax. Bony thorax is demineralized. There is a mild compression deformity of a mid thoracic vertebra. IMPRESSION: 1. No acute cardiopulmonary disease. 2. Mild compression fracture of a mid thoracic vertebrae of unclear chronicity. Electronically Signed   By: Lajean Manes M.D.   On: 07/02/2016 15:25   Dg Thoracic Spine 2 View  Result Date: 07/02/2016 CLINICAL DATA:  Fall several hours ago down her basement stairs; Large bruise to forehead; Painful right shoulder; Pain in mid back EXAM: THORACIC SPINE 2 VIEWS COMPARISON:  None. FINDINGS: Compression deformity in the mid thoracic spine (T7) with approximately 30% loss vertebral body height anteriorly is indeterminate age. No retropulsion. No additional evidence of fracture. IMPRESSION: Age-indeterminate mild compression deformity at T7. Electronically Signed   By: Suzy Bouchard M.D.   On: 07/02/2016 15:26   Dg Shoulder Right  Result Date: 07/02/2016 CLINICAL DATA:  Fall several hours ago down her basement stairs; Large bruise to forehead; Painful right shoulder;  Pain in mid back EXAM: RIGHT SHOULDER - 2+ VIEW COMPARISON:  None. FINDINGS: No fracture or dislocation. Bones are demineralized. No significant arthropathic change. Soft tissues are unremarkable. IMPRESSION: No fracture or dislocation. Electronically Signed   By: Lajean Manes M.D.   On: 07/02/2016 15:24   Ct Head Wo Contrast  Result Date: 07/02/2016 CLINICAL DATA:  Patient status post fall down the stairs. Positive reported loss of consciousness. Soft tissue swelling and hematoma overlying the left frontal calvarium. EXAM: CT HEAD WITHOUT CONTRAST CT CERVICAL SPINE WITHOUT CONTRAST TECHNIQUE: Multidetector CT imaging of the head and cervical spine was performed following the standard protocol without intravenous contrast. Multiplanar CT image reconstructions of the cervical spine were also generated. COMPARISON:  Brain CT 04/12/2015.  FINDINGS: CT HEAD FINDINGS Brain: Ventricles and sulci are appropriate for patient's age. Periventricular and subcortical white matter hypodensity compatible with chronic microvascular ischemic changes. Old right basal ganglia infarct. No evidence for acute cortically based infarct, intracranial hemorrhage, mass lesion or mass-effect. Vascular: Unremarkable. Skull: Intact. Sinuses/Orbits: Paranasal sinuses and mastoid air cells are well aerated. Orbits are unremarkable. Other: Soft tissue swelling overlying the left frontal calvarium. CT CERVICAL SPINE FINDINGS Normal anatomic alignment. Multilevel degenerative disc disease most pronounced C4-5, C5-6 and C6-7. Craniocervical junction is intact. No acute cervical spine fracture. Prevertebral soft tissues unremarkable. Right greater than left apical pleural parenchymal thickening. Nondisplaced left first rib fracture. This is best demonstrated on image 54; series 7. Nondisplaced rib fractures through the posterior medial right first and third ribs (image 45; series 6) and (image 62; series 6). Mildly displaced fracture through the posterior medial right second rib (image 53; series 6). Possible nondisplaced fracture through the medial left fourth rib (image 256; series 5). IMPRESSION: Bilateral rib fractures involving the posterior medial right first, second and third ribs. Nondisplaced fracture through the posterior left first rib. Possible nondisplaced fracture through the medial left fourth rib. No acute intracranial process. Old right basal ganglia lacunar infarct. No acute cervical spine fracture. Soft tissue swelling overlying the left frontal calvarium without underlying skull fracture. Electronically Signed   By: Lovey Newcomer M.D.   On: 07/02/2016 16:03   Ct Chest W Contrast  Result Date: 07/02/2016 CLINICAL DATA:  Fall down 13 stairs with rib pain and forehead hematoma. EXAM: CT CHEST, ABDOMEN, AND PELVIS WITH CONTRAST TECHNIQUE: Multidetector CT imaging  of the chest, abdomen and pelvis was performed following the standard protocol during bolus administration of intravenous contrast. CONTRAST:  8mL ISOVUE-300 IOPAMIDOL (ISOVUE-300) INJECTION 61% COMPARISON:  None. FINDINGS: CT CHEST FINDINGS Cardiovascular: Pacemaker over the left chest wall. There is mild cardiomegaly. Mild calcified plaque over the left anterior descending coronary artery. Calcified plaque over the thoracic aorta. Remaining vascular structures are within normal. Mediastinum/Nodes: No evidence of mediastinal or hilar adenopathy. Remaining mediastinal structures are within normal. Lungs/Pleura: Lungs are well inflated with mild bibasilar dependent atelectasis. Tiny sliver of a pneumothorax over the anterior right midlung tiny adjacent contusion. Possible tiny amount of air just anterior to the heart. Adjacent Airways are normal. Musculoskeletal: Subtle fractures of the posterior medial aspect at the costovertebral junction of the right second and third ribs. There is a moderate T4 compression fracture age indeterminate. There is also a mild compression deformity of T7 age indeterminate. There are degenerative changes of the spine. CT ABDOMEN PELVIS FINDINGS Hepatobiliary: Previous cholecystectomy. Liver and biliary tree are otherwise within normal. Pancreas: Within normal. Spleen: Within normal. Adrenals/Urinary Tract: Adrenal glands are normal. Atrophic left kidney with  a couple simple cysts over the upper pole. Right kidney is normal in size without hydronephrosis. There is a 4 mm stone over the posterior mid to upper pole. There are multiple right renal cysts. Bladder is unremarkable. Visualized ureters are unremarkable. Stomach/Bowel: Stomach and small bowel are within normal. Appendix is normal. Colon is unremarkable. There is moderate fecal retention throughout the colon. Vascular/Lymphatic: There is calcified plaque over the abdominal aorta and iliac arteries. There is no evidence  adenopathy. Reproductive: Within normal. Other: Minimal free pelvic fluid. No focal inflammatory change. No free peritoneal air. Musculoskeletal: Degenerative change of the spine. IMPRESSION: Tiny sliver of a right pneumothorax over the anterior mid lung with small associated pulmonary contusion. Fractures of the posterior medial costovertebral junction of the right second and third ribs. No acute findings in the abdomen/ pelvis. Mild cardiomegaly and minimal atherosclerotic coronary artery disease. Aortic atherosclerosis. Atrophic left kidney.  Bilateral renal cysts. Small amount free fluid in the pelvis. Compression fractures of T4 and T7 age indeterminate but likely chronic. Critical Value/emergent results were called by telephone at the time of interpretation on 07/02/2016 at 6:42 pm to Dr. Malvin Johns, who verbally acknowledged these results. Electronically Signed   By: Marin Olp M.D.   On: 07/02/2016 18:41   Ct Cervical Spine Wo Contrast  Result Date: 07/02/2016 CLINICAL DATA:  Patient status post fall down the stairs. Positive reported loss of consciousness. Soft tissue swelling and hematoma overlying the left frontal calvarium. EXAM: CT HEAD WITHOUT CONTRAST CT CERVICAL SPINE WITHOUT CONTRAST TECHNIQUE: Multidetector CT imaging of the head and cervical spine was performed following the standard protocol without intravenous contrast. Multiplanar CT image reconstructions of the cervical spine were also generated. COMPARISON:  Brain CT 04/12/2015. FINDINGS: CT HEAD FINDINGS Brain: Ventricles and sulci are appropriate for patient's age. Periventricular and subcortical white matter hypodensity compatible with chronic microvascular ischemic changes. Old right basal ganglia infarct. No evidence for acute cortically based infarct, intracranial hemorrhage, mass lesion or mass-effect. Vascular: Unremarkable. Skull: Intact. Sinuses/Orbits: Paranasal sinuses and mastoid air cells are well aerated. Orbits are  unremarkable. Other: Soft tissue swelling overlying the left frontal calvarium. CT CERVICAL SPINE FINDINGS Normal anatomic alignment. Multilevel degenerative disc disease most pronounced C4-5, C5-6 and C6-7. Craniocervical junction is intact. No acute cervical spine fracture. Prevertebral soft tissues unremarkable. Right greater than left apical pleural parenchymal thickening. Nondisplaced left first rib fracture. This is best demonstrated on image 54; series 7. Nondisplaced rib fractures through the posterior medial right first and third ribs (image 45; series 6) and (image 62; series 6). Mildly displaced fracture through the posterior medial right second rib (image 53; series 6). Possible nondisplaced fracture through the medial left fourth rib (image 256; series 5). IMPRESSION: Bilateral rib fractures involving the posterior medial right first, second and third ribs. Nondisplaced fracture through the posterior left first rib. Possible nondisplaced fracture through the medial left fourth rib. No acute intracranial process. Old right basal ganglia lacunar infarct. No acute cervical spine fracture. Soft tissue swelling overlying the left frontal calvarium without underlying skull fracture. Electronically Signed   By: Lovey Newcomer M.D.   On: 07/02/2016 16:03   Ct Abdomen Pelvis W Contrast  Result Date: 07/02/2016 CLINICAL DATA:  Fall down 13 stairs with rib pain and forehead hematoma. EXAM: CT CHEST, ABDOMEN, AND PELVIS WITH CONTRAST TECHNIQUE: Multidetector CT imaging of the chest, abdomen and pelvis was performed following the standard protocol during bolus administration of intravenous contrast. CONTRAST:  14mL ISOVUE-300 IOPAMIDOL (ISOVUE-300)  INJECTION 61% COMPARISON:  None. FINDINGS: CT CHEST FINDINGS Cardiovascular: Pacemaker over the left chest wall. There is mild cardiomegaly. Mild calcified plaque over the left anterior descending coronary artery. Calcified plaque over the thoracic aorta. Remaining  vascular structures are within normal. Mediastinum/Nodes: No evidence of mediastinal or hilar adenopathy. Remaining mediastinal structures are within normal. Lungs/Pleura: Lungs are well inflated with mild bibasilar dependent atelectasis. Tiny sliver of a pneumothorax over the anterior right midlung tiny adjacent contusion. Possible tiny amount of air just anterior to the heart. Adjacent Airways are normal. Musculoskeletal: Subtle fractures of the posterior medial aspect at the costovertebral junction of the right second and third ribs. There is a moderate T4 compression fracture age indeterminate. There is also a mild compression deformity of T7 age indeterminate. There are degenerative changes of the spine. CT ABDOMEN PELVIS FINDINGS Hepatobiliary: Previous cholecystectomy. Liver and biliary tree are otherwise within normal. Pancreas: Within normal. Spleen: Within normal. Adrenals/Urinary Tract: Adrenal glands are normal. Atrophic left kidney with a couple simple cysts over the upper pole. Right kidney is normal in size without hydronephrosis. There is a 4 mm stone over the posterior mid to upper pole. There are multiple right renal cysts. Bladder is unremarkable. Visualized ureters are unremarkable. Stomach/Bowel: Stomach and small bowel are within normal. Appendix is normal. Colon is unremarkable. There is moderate fecal retention throughout the colon. Vascular/Lymphatic: There is calcified plaque over the abdominal aorta and iliac arteries. There is no evidence adenopathy. Reproductive: Within normal. Other: Minimal free pelvic fluid. No focal inflammatory change. No free peritoneal air. Musculoskeletal: Degenerative change of the spine. IMPRESSION: Tiny sliver of a right pneumothorax over the anterior mid lung with small associated pulmonary contusion. Fractures of the posterior medial costovertebral junction of the right second and third ribs. No acute findings in the abdomen/ pelvis. Mild cardiomegaly and  minimal atherosclerotic coronary artery disease. Aortic atherosclerosis. Atrophic left kidney.  Bilateral renal cysts. Small amount free fluid in the pelvis. Compression fractures of T4 and T7 age indeterminate but likely chronic. Critical Value/emergent results were called by telephone at the time of interpretation on 07/02/2016 at 6:42 pm to Dr. Malvin Johns, who verbally acknowledged these results. Electronically Signed   By: Marin Olp M.D.   On: 07/02/2016 18:41   Dg Hand Complete Left  Result Date: 07/02/2016 CLINICAL DATA:  Left hand pain with swelling and bruising posteriorly. Fall. EXAM: LEFT HAND - COMPLETE 3+ VIEW COMPARISON:  None. FINDINGS: Mild degenerative change over the radiocarpal joint and carpal bones. Minimal degenerative change of the interphalangeal joints. Mild diffuse osteopenia. There is a minimally displaced fracture of the first distal phalanx. IMPRESSION: Minimally displaced fracture of the first distal phalanx. Electronically Signed   By: Marin Olp M.D.   On: 07/02/2016 21:13   Care taken over from Dr. Audie Pinto.  Pt with right first and second ribs, small PTX and pulmonary contusion.  Seen by Dr. Georgette Dover who will admit.  I did add on an xray of there left hand as she has swelling and ecchymosis.  Shows fx of distal phalanx.  Finger splint placed.   Malvin Johns, MD 07/02/16 [id number]

## 2016-07-02 NOTE — ED Notes (Signed)
Attempted to call report

## 2016-07-03 ENCOUNTER — Inpatient Hospital Stay (HOSPITAL_COMMUNITY): Payer: PPO

## 2016-07-03 DIAGNOSIS — S299XXA Unspecified injury of thorax, initial encounter: Secondary | ICD-10-CM | POA: Diagnosis not present

## 2016-07-03 DIAGNOSIS — S270XXA Traumatic pneumothorax, initial encounter: Secondary | ICD-10-CM | POA: Diagnosis not present

## 2016-07-03 DIAGNOSIS — J939 Pneumothorax, unspecified: Secondary | ICD-10-CM | POA: Diagnosis not present

## 2016-07-03 DIAGNOSIS — S27321A Contusion of lung, unilateral, initial encounter: Secondary | ICD-10-CM | POA: Diagnosis not present

## 2016-07-03 DIAGNOSIS — S2241XA Multiple fractures of ribs, right side, initial encounter for closed fracture: Secondary | ICD-10-CM | POA: Diagnosis not present

## 2016-07-03 LAB — CBC
HEMATOCRIT: 33.9 % — AB (ref 36.0–46.0)
Hemoglobin: 10.4 g/dL — ABNORMAL LOW (ref 12.0–15.0)
MCH: 29.6 pg (ref 26.0–34.0)
MCHC: 30.7 g/dL (ref 30.0–36.0)
MCV: 96.6 fL (ref 78.0–100.0)
PLATELETS: 148 10*3/uL — AB (ref 150–400)
RBC: 3.51 MIL/uL — ABNORMAL LOW (ref 3.87–5.11)
RDW: 13 % (ref 11.5–15.5)
WBC: 8.4 10*3/uL (ref 4.0–10.5)

## 2016-07-03 LAB — MRSA PCR SCREENING: MRSA by PCR: NEGATIVE

## 2016-07-03 LAB — COMPREHENSIVE METABOLIC PANEL
ALBUMIN: 3.3 g/dL — AB (ref 3.5–5.0)
ALT: 18 U/L (ref 14–54)
ANION GAP: 9 (ref 5–15)
AST: 33 U/L (ref 15–41)
Alkaline Phosphatase: 53 U/L (ref 38–126)
BILIRUBIN TOTAL: 1.1 mg/dL (ref 0.3–1.2)
BUN: 21 mg/dL — AB (ref 6–20)
CHLORIDE: 100 mmol/L — AB (ref 101–111)
CO2: 27 mmol/L (ref 22–32)
Calcium: 9.4 mg/dL (ref 8.9–10.3)
Creatinine, Ser: 1.24 mg/dL — ABNORMAL HIGH (ref 0.44–1.00)
GFR calc Af Amer: 45 mL/min — ABNORMAL LOW (ref >60)
GFR calc non Af Amer: 38 mL/min — ABNORMAL LOW (ref >60)
GLUCOSE: 133 mg/dL — AB (ref 65–99)
POTASSIUM: 4.2 mmol/L (ref 3.5–5.1)
SODIUM: 136 mmol/L (ref 135–145)
TOTAL PROTEIN: 6.2 g/dL — AB (ref 6.5–8.1)

## 2016-07-03 MED ORDER — ACETAMINOPHEN 500 MG PO TABS
1000.0000 mg | ORAL_TABLET | Freq: Four times a day (QID) | ORAL | Status: DC | PRN
  Administered 2016-07-03 – 2016-07-04 (×3): 1000 mg via ORAL
  Filled 2016-07-03 (×3): qty 2

## 2016-07-03 MED ORDER — ALPRAZOLAM 0.25 MG PO TABS
0.2500 mg | ORAL_TABLET | Freq: Once | ORAL | Status: AC
  Administered 2016-07-03: 0.25 mg via ORAL
  Filled 2016-07-03: qty 1

## 2016-07-03 NOTE — Progress Notes (Signed)
Subjective: Complains of some nausea today. No vomiting. Tolerating clears  Objective: Vital signs in last 24 hours: Temp:  [97.8 F (36.6 C)-98.9 F (37.2 C)] 98.1 F (36.7 C) (09/17 0614) Pulse Rate:  [59-68] 63 (09/17 0614) Resp:  [16-25] 19 (09/17 0614) BP: (117-154)/(59-84) 137/65 (09/17 0614) SpO2:  [95 %-100 %] 95 % (09/17 0614) Last BM Date: 07/02/16  Intake/Output from previous day: 09/16 0701 - 09/17 0700 In: 240 [P.O.:240] Out: 1 [Emesis/NG output:1] Intake/Output this shift: No intake/output data recorded.  Resp: clear to auscultation bilaterally Cardio: irregularly irregular rhythm GI: soft, nontender  Lab Results:   Recent Labs  07/02/16 1438 07/03/16 0350  WBC 10.9* 8.4  HGB 11.8* 10.4*  HCT 37.4 33.9*  PLT 169 148*   BMET  Recent Labs  07/02/16 1438 07/03/16 0350  NA 138 136  K 4.5 4.2  CL 105 100*  CO2 29 27  GLUCOSE 127* 133*  BUN 24* 21*  CREATININE 1.35* 1.24*  CALCIUM 9.3 9.4   PT/INR  Recent Labs  07/02/16 1438  LABPROT 16.7*  INR 1.34   ABG No results for input(s): PHART, HCO3 in the last 72 hours.  Invalid input(s): PCO2, PO2  Studies/Results: Dg Chest 2 View  Result Date: 07/02/2016 CLINICAL DATA:  Fall several hours ago down her basement stairs; Large bruise to forehead; Painful right shoulder; Pain in mid back EXAM: CHEST  2 VIEW COMPARISON:  None. FINDINGS: Cardiac silhouette is top-normal in size. No mediastinal or hilar masses or evidence of adenopathy. Left anterior chest wall sequential pacemaker is well positioned. Clear lungs.  No pleural effusion or pneumothorax. Bony thorax is demineralized. There is a mild compression deformity of a mid thoracic vertebra. IMPRESSION: 1. No acute cardiopulmonary disease. 2. Mild compression fracture of a mid thoracic vertebrae of unclear chronicity. Electronically Signed   By: Lajean Manes M.D.   On: 07/02/2016 15:25   Dg Thoracic Spine 2 View  Result Date:  07/02/2016 CLINICAL DATA:  Fall several hours ago down her basement stairs; Large bruise to forehead; Painful right shoulder; Pain in mid back EXAM: THORACIC SPINE 2 VIEWS COMPARISON:  None. FINDINGS: Compression deformity in the mid thoracic spine (T7) with approximately 30% loss vertebral body height anteriorly is indeterminate age. No retropulsion. No additional evidence of fracture. IMPRESSION: Age-indeterminate mild compression deformity at T7. Electronically Signed   By: Suzy Bouchard M.D.   On: 07/02/2016 15:26   Dg Shoulder Right  Result Date: 07/02/2016 CLINICAL DATA:  Fall several hours ago down her basement stairs; Large bruise to forehead; Painful right shoulder; Pain in mid back EXAM: RIGHT SHOULDER - 2+ VIEW COMPARISON:  None. FINDINGS: No fracture or dislocation. Bones are demineralized. No significant arthropathic change. Soft tissues are unremarkable. IMPRESSION: No fracture or dislocation. Electronically Signed   By: Lajean Manes M.D.   On: 07/02/2016 15:24   Ct Head Wo Contrast  Result Date: 07/02/2016 CLINICAL DATA:  Patient status post fall down the stairs. Positive reported loss of consciousness. Soft tissue swelling and hematoma overlying the left frontal calvarium. EXAM: CT HEAD WITHOUT CONTRAST CT CERVICAL SPINE WITHOUT CONTRAST TECHNIQUE: Multidetector CT imaging of the head and cervical spine was performed following the standard protocol without intravenous contrast. Multiplanar CT image reconstructions of the cervical spine were also generated. COMPARISON:  Brain CT 04/12/2015. FINDINGS: CT HEAD FINDINGS Brain: Ventricles and sulci are appropriate for patient's age. Periventricular and subcortical white matter hypodensity compatible with chronic microvascular ischemic changes. Old right basal ganglia  infarct. No evidence for acute cortically based infarct, intracranial hemorrhage, mass lesion or mass-effect. Vascular: Unremarkable. Skull: Intact. Sinuses/Orbits: Paranasal  sinuses and mastoid air cells are well aerated. Orbits are unremarkable. Other: Soft tissue swelling overlying the left frontal calvarium. CT CERVICAL SPINE FINDINGS Normal anatomic alignment. Multilevel degenerative disc disease most pronounced C4-5, C5-6 and C6-7. Craniocervical junction is intact. No acute cervical spine fracture. Prevertebral soft tissues unremarkable. Right greater than left apical pleural parenchymal thickening. Nondisplaced left first rib fracture. This is best demonstrated on image 54; series 7. Nondisplaced rib fractures through the posterior medial right first and third ribs (image 45; series 6) and (image 62; series 6). Mildly displaced fracture through the posterior medial right second rib (image 53; series 6). Possible nondisplaced fracture through the medial left fourth rib (image 256; series 5). IMPRESSION: Bilateral rib fractures involving the posterior medial right first, second and third ribs. Nondisplaced fracture through the posterior left first rib. Possible nondisplaced fracture through the medial left fourth rib. No acute intracranial process. Old right basal ganglia lacunar infarct. No acute cervical spine fracture. Soft tissue swelling overlying the left frontal calvarium without underlying skull fracture. Electronically Signed   By: Lovey Newcomer M.D.   On: 07/02/2016 16:03   Ct Chest W Contrast  Result Date: 07/02/2016 CLINICAL DATA:  Fall down 13 stairs with rib pain and forehead hematoma. EXAM: CT CHEST, ABDOMEN, AND PELVIS WITH CONTRAST TECHNIQUE: Multidetector CT imaging of the chest, abdomen and pelvis was performed following the standard protocol during bolus administration of intravenous contrast. CONTRAST:  11mL ISOVUE-300 IOPAMIDOL (ISOVUE-300) INJECTION 61% COMPARISON:  None. FINDINGS: CT CHEST FINDINGS Cardiovascular: Pacemaker over the left chest wall. There is mild cardiomegaly. Mild calcified plaque over the left anterior descending coronary artery.  Calcified plaque over the thoracic aorta. Remaining vascular structures are within normal. Mediastinum/Nodes: No evidence of mediastinal or hilar adenopathy. Remaining mediastinal structures are within normal. Lungs/Pleura: Lungs are well inflated with mild bibasilar dependent atelectasis. Tiny sliver of a pneumothorax over the anterior right midlung tiny adjacent contusion. Possible tiny amount of air just anterior to the heart. Adjacent Airways are normal. Musculoskeletal: Subtle fractures of the posterior medial aspect at the costovertebral junction of the right second and third ribs. There is a moderate T4 compression fracture age indeterminate. There is also a mild compression deformity of T7 age indeterminate. There are degenerative changes of the spine. CT ABDOMEN PELVIS FINDINGS Hepatobiliary: Previous cholecystectomy. Liver and biliary tree are otherwise within normal. Pancreas: Within normal. Spleen: Within normal. Adrenals/Urinary Tract: Adrenal glands are normal. Atrophic left kidney with a couple simple cysts over the upper pole. Right kidney is normal in size without hydronephrosis. There is a 4 mm stone over the posterior mid to upper pole. There are multiple right renal cysts. Bladder is unremarkable. Visualized ureters are unremarkable. Stomach/Bowel: Stomach and small bowel are within normal. Appendix is normal. Colon is unremarkable. There is moderate fecal retention throughout the colon. Vascular/Lymphatic: There is calcified plaque over the abdominal aorta and iliac arteries. There is no evidence adenopathy. Reproductive: Within normal. Other: Minimal free pelvic fluid. No focal inflammatory change. No free peritoneal air. Musculoskeletal: Degenerative change of the spine. IMPRESSION: Tiny sliver of a right pneumothorax over the anterior mid lung with small associated pulmonary contusion. Fractures of the posterior medial costovertebral junction of the right second and third ribs. No acute  findings in the abdomen/ pelvis. Mild cardiomegaly and minimal atherosclerotic coronary artery disease. Aortic atherosclerosis. Atrophic left kidney.  Bilateral  renal cysts. Small amount free fluid in the pelvis. Compression fractures of T4 and T7 age indeterminate but likely chronic. Critical Value/emergent results were called by telephone at the time of interpretation on 07/02/2016 at 6:42 pm to Dr. Malvin Johns, who verbally acknowledged these results. Electronically Signed   By: Marin Olp M.D.   On: 07/02/2016 18:41   Ct Cervical Spine Wo Contrast  Result Date: 07/02/2016 CLINICAL DATA:  Patient status post fall down the stairs. Positive reported loss of consciousness. Soft tissue swelling and hematoma overlying the left frontal calvarium. EXAM: CT HEAD WITHOUT CONTRAST CT CERVICAL SPINE WITHOUT CONTRAST TECHNIQUE: Multidetector CT imaging of the head and cervical spine was performed following the standard protocol without intravenous contrast. Multiplanar CT image reconstructions of the cervical spine were also generated. COMPARISON:  Brain CT 04/12/2015. FINDINGS: CT HEAD FINDINGS Brain: Ventricles and sulci are appropriate for patient's age. Periventricular and subcortical white matter hypodensity compatible with chronic microvascular ischemic changes. Old right basal ganglia infarct. No evidence for acute cortically based infarct, intracranial hemorrhage, mass lesion or mass-effect. Vascular: Unremarkable. Skull: Intact. Sinuses/Orbits: Paranasal sinuses and mastoid air cells are well aerated. Orbits are unremarkable. Other: Soft tissue swelling overlying the left frontal calvarium. CT CERVICAL SPINE FINDINGS Normal anatomic alignment. Multilevel degenerative disc disease most pronounced C4-5, C5-6 and C6-7. Craniocervical junction is intact. No acute cervical spine fracture. Prevertebral soft tissues unremarkable. Right greater than left apical pleural parenchymal thickening. Nondisplaced left first  rib fracture. This is best demonstrated on image 54; series 7. Nondisplaced rib fractures through the posterior medial right first and third ribs (image 45; series 6) and (image 62; series 6). Mildly displaced fracture through the posterior medial right second rib (image 53; series 6). Possible nondisplaced fracture through the medial left fourth rib (image 256; series 5). IMPRESSION: Bilateral rib fractures involving the posterior medial right first, second and third ribs. Nondisplaced fracture through the posterior left first rib. Possible nondisplaced fracture through the medial left fourth rib. No acute intracranial process. Old right basal ganglia lacunar infarct. No acute cervical spine fracture. Soft tissue swelling overlying the left frontal calvarium without underlying skull fracture. Electronically Signed   By: Lovey Newcomer M.D.   On: 07/02/2016 16:03   Ct Abdomen Pelvis W Contrast  Result Date: 07/02/2016 CLINICAL DATA:  Fall down 13 stairs with rib pain and forehead hematoma. EXAM: CT CHEST, ABDOMEN, AND PELVIS WITH CONTRAST TECHNIQUE: Multidetector CT imaging of the chest, abdomen and pelvis was performed following the standard protocol during bolus administration of intravenous contrast. CONTRAST:  51mL ISOVUE-300 IOPAMIDOL (ISOVUE-300) INJECTION 61% COMPARISON:  None. FINDINGS: CT CHEST FINDINGS Cardiovascular: Pacemaker over the left chest wall. There is mild cardiomegaly. Mild calcified plaque over the left anterior descending coronary artery. Calcified plaque over the thoracic aorta. Remaining vascular structures are within normal. Mediastinum/Nodes: No evidence of mediastinal or hilar adenopathy. Remaining mediastinal structures are within normal. Lungs/Pleura: Lungs are well inflated with mild bibasilar dependent atelectasis. Tiny sliver of a pneumothorax over the anterior right midlung tiny adjacent contusion. Possible tiny amount of air just anterior to the heart. Adjacent Airways are  normal. Musculoskeletal: Subtle fractures of the posterior medial aspect at the costovertebral junction of the right second and third ribs. There is a moderate T4 compression fracture age indeterminate. There is also a mild compression deformity of T7 age indeterminate. There are degenerative changes of the spine. CT ABDOMEN PELVIS FINDINGS Hepatobiliary: Previous cholecystectomy. Liver and biliary tree are otherwise within normal. Pancreas: Within  normal. Spleen: Within normal. Adrenals/Urinary Tract: Adrenal glands are normal. Atrophic left kidney with a couple simple cysts over the upper pole. Right kidney is normal in size without hydronephrosis. There is a 4 mm stone over the posterior mid to upper pole. There are multiple right renal cysts. Bladder is unremarkable. Visualized ureters are unremarkable. Stomach/Bowel: Stomach and small bowel are within normal. Appendix is normal. Colon is unremarkable. There is moderate fecal retention throughout the colon. Vascular/Lymphatic: There is calcified plaque over the abdominal aorta and iliac arteries. There is no evidence adenopathy. Reproductive: Within normal. Other: Minimal free pelvic fluid. No focal inflammatory change. No free peritoneal air. Musculoskeletal: Degenerative change of the spine. IMPRESSION: Tiny sliver of a right pneumothorax over the anterior mid lung with small associated pulmonary contusion. Fractures of the posterior medial costovertebral junction of the right second and third ribs. No acute findings in the abdomen/ pelvis. Mild cardiomegaly and minimal atherosclerotic coronary artery disease. Aortic atherosclerosis. Atrophic left kidney.  Bilateral renal cysts. Small amount free fluid in the pelvis. Compression fractures of T4 and T7 age indeterminate but likely chronic. Critical Value/emergent results were called by telephone at the time of interpretation on 07/02/2016 at 6:42 pm to Dr. Malvin Johns, who verbally acknowledged these results.  Electronically Signed   By: Marin Olp M.D.   On: 07/02/2016 18:41   Dg Hand Complete Left  Result Date: 07/02/2016 CLINICAL DATA:  Left hand pain with swelling and bruising posteriorly. Fall. EXAM: LEFT HAND - COMPLETE 3+ VIEW COMPARISON:  None. FINDINGS: Mild degenerative change over the radiocarpal joint and carpal bones. Minimal degenerative change of the interphalangeal joints. Mild diffuse osteopenia. There is a minimally displaced fracture of the first distal phalanx. IMPRESSION: Minimally displaced fracture of the first distal phalanx. Electronically Signed   By: Marin Olp M.D.   On: 07/02/2016 21:13    Anti-infectives: Anti-infectives    None      Assessment/Plan: s/p * No surgery found * Advance diet once nausea resolves R rib fxs. Pain control R pneumothorax and pulmonary contusion. Clinically stable. Check cxr this am If this looks ok then may transfer to floor later today eliquis on hold for now. Hg down slightly Afib. stable  LOS: 1 day    TOTH III,PAUL S 07/03/2016

## 2016-07-04 DIAGNOSIS — D62 Acute posthemorrhagic anemia: Secondary | ICD-10-CM | POA: Diagnosis not present

## 2016-07-04 DIAGNOSIS — S27321A Contusion of lung, unilateral, initial encounter: Secondary | ICD-10-CM | POA: Diagnosis not present

## 2016-07-04 DIAGNOSIS — S270XXA Traumatic pneumothorax, initial encounter: Secondary | ICD-10-CM | POA: Diagnosis not present

## 2016-07-04 DIAGNOSIS — S2241XA Multiple fractures of ribs, right side, initial encounter for closed fracture: Secondary | ICD-10-CM | POA: Diagnosis not present

## 2016-07-04 MED ORDER — MORPHINE SULFATE (PF) 2 MG/ML IV SOLN: 1.0000 mg | INTRAVENOUS | Status: DC | PRN

## 2016-07-04 MED ORDER — METHOCARBAMOL 500 MG PO TABS
500.0000 mg | ORAL_TABLET | Freq: Four times a day (QID) | ORAL | Status: DC | PRN
  Administered 2016-07-05 – 2016-07-07 (×7): 500 mg via ORAL
  Filled 2016-07-04 (×8): qty 1

## 2016-07-04 MED ORDER — APIXABAN 5 MG PO TABS
5.0000 mg | ORAL_TABLET | Freq: Two times a day (BID) | ORAL | Status: DC
  Administered 2016-07-04 – 2016-07-07 (×7): 5 mg via ORAL
  Filled 2016-07-04 (×7): qty 1

## 2016-07-04 MED ORDER — TRAMADOL HCL 50 MG PO TABS
50.0000 mg | ORAL_TABLET | Freq: Four times a day (QID) | ORAL | Status: DC
  Administered 2016-07-04 – 2016-07-05 (×4): 50 mg via ORAL
  Filled 2016-07-04 (×5): qty 1

## 2016-07-04 NOTE — Progress Notes (Signed)
Patient's granddaughter, Jessica Myers, in room assisting with care.  Granddaughter discovered 2.5 yellow oblong tablets and 2 half pieces of white oblong tablets in the patient's bed when the patient was assisted to the bathroom.  The patient was asked what they were and she wasn't sure, but stated "I carry extra medications with me and they probably fell out of my pocketbook".  I explicitly instructed the patient in front of Jessica Myers, NOT to take any of her own medications, only what we provide.  I also explained the reasons for this.  Patient verbalized her understanding.  I requested Jessica Myers to go through her grandmother's purse and remove any medications she finds.  She stated "I already have and I removed a bottle of Xanax".  I requested that she remove any additional medications she might find.  She agreed to do so.  The tablets were placed in the sharps container; Jessica Myers witnessed this action.

## 2016-07-04 NOTE — Progress Notes (Signed)
Trauma Service Note  Subjective: Patient has been out of bed.  No acute distress  Will order PT/OT.  Nurse has been great about getting the patient out of bed.  Objective: Vital signs in last 24 hours: Temp:  [97.8 F (36.6 C)-98.8 F (37.1 C)] 98.8 F (37.1 C) (09/18 0735) Pulse Rate:  [60-68] 65 (09/18 0735) Resp:  [18-23] 21 (09/18 0735) BP: (126-153)/(60-77) 139/74 (09/18 0735) SpO2:  [91 %-98 %] 94 % (09/18 0735) Last BM Date: 06/30/16 (stated it has been about 3 days)  Intake/Output from previous day: 09/17 0701 - 09/18 0700 In: 657.5 [I.V.:657.5] Out: -  Intake/Output this shift: Total I/O In: -  Out: 150 [Urine:150]  General: No acute distress.  Purple forehead  Lungs: Clear.  IS up to 1000  Abd: Benign  Extremities: Right shoulder pain  Neuro: Intact  Lab Results: CBC   Recent Labs  07/02/16 1438 07/03/16 0350  WBC 10.9* 8.4  HGB 11.8* 10.4*  HCT 37.4 33.9*  PLT 169 148*   BMET  Recent Labs  07/02/16 1438 07/03/16 0350  NA 138 136  K 4.5 4.2  CL 105 100*  CO2 29 27  GLUCOSE 127* 133*  BUN 24* 21*  CREATININE 1.35* 1.24*  CALCIUM 9.3 9.4   PT/INR  Recent Labs  07/02/16 1438  LABPROT 16.7*  INR 1.34   ABG No results for input(s): PHART, HCO3 in the last 72 hours.  Invalid input(s): PCO2, PO2  Studies/Results: Dg Chest 2 View  Result Date: 07/03/2016 CLINICAL DATA:  Pneumothorax.  Fall down stairs EXAM: CHEST  2 VIEW COMPARISON:  CT 07/02/2016 FINDINGS: Normal cardiac silhouette. LEFT-sided pacer. No pulmonary contusion or pleural fluid. No pneumothorax appreciated. On lateral projection, compression fracture at at T4 with approximately 50% loss vertebral body height and T7 with approximately 30% loss of vertebral height. IMPRESSION: 1. No evidence pneumothorax. 2. Compression fractures at T4 and T7, age-indeterminate. Electronically Signed   By: Suzy Bouchard M.D.   On: 07/03/2016 13:41   Dg Chest 2 View  Result Date:  07/02/2016 CLINICAL DATA:  Fall several hours ago down her basement stairs; Large bruise to forehead; Painful right shoulder; Pain in mid back EXAM: CHEST  2 VIEW COMPARISON:  None. FINDINGS: Cardiac silhouette is top-normal in size. No mediastinal or hilar masses or evidence of adenopathy. Left anterior chest wall sequential pacemaker is well positioned. Clear lungs.  No pleural effusion or pneumothorax. Bony thorax is demineralized. There is a mild compression deformity of a mid thoracic vertebra. IMPRESSION: 1. No acute cardiopulmonary disease. 2. Mild compression fracture of a mid thoracic vertebrae of unclear chronicity. Electronically Signed   By: Lajean Manes M.D.   On: 07/02/2016 15:25   Dg Thoracic Spine 2 View  Result Date: 07/02/2016 CLINICAL DATA:  Fall several hours ago down her basement stairs; Large bruise to forehead; Painful right shoulder; Pain in mid back EXAM: THORACIC SPINE 2 VIEWS COMPARISON:  None. FINDINGS: Compression deformity in the mid thoracic spine (T7) with approximately 30% loss vertebral body height anteriorly is indeterminate age. No retropulsion. No additional evidence of fracture. IMPRESSION: Age-indeterminate mild compression deformity at T7. Electronically Signed   By: Suzy Bouchard M.D.   On: 07/02/2016 15:26   Dg Shoulder Right  Result Date: 07/02/2016 CLINICAL DATA:  Fall several hours ago down her basement stairs; Large bruise to forehead; Painful right shoulder; Pain in mid back EXAM: RIGHT SHOULDER - 2+ VIEW COMPARISON:  None. FINDINGS: No fracture or dislocation.  Bones are demineralized. No significant arthropathic change. Soft tissues are unremarkable. IMPRESSION: No fracture or dislocation. Electronically Signed   By: Lajean Manes M.D.   On: 07/02/2016 15:24   Ct Head Wo Contrast  Result Date: 07/02/2016 CLINICAL DATA:  Patient status post fall down the stairs. Positive reported loss of consciousness. Soft tissue swelling and hematoma overlying the left  frontal calvarium. EXAM: CT HEAD WITHOUT CONTRAST CT CERVICAL SPINE WITHOUT CONTRAST TECHNIQUE: Multidetector CT imaging of the head and cervical spine was performed following the standard protocol without intravenous contrast. Multiplanar CT image reconstructions of the cervical spine were also generated. COMPARISON:  Brain CT 04/12/2015. FINDINGS: CT HEAD FINDINGS Brain: Ventricles and sulci are appropriate for patient's age. Periventricular and subcortical white matter hypodensity compatible with chronic microvascular ischemic changes. Old right basal ganglia infarct. No evidence for acute cortically based infarct, intracranial hemorrhage, mass lesion or mass-effect. Vascular: Unremarkable. Skull: Intact. Sinuses/Orbits: Paranasal sinuses and mastoid air cells are well aerated. Orbits are unremarkable. Other: Soft tissue swelling overlying the left frontal calvarium. CT CERVICAL SPINE FINDINGS Normal anatomic alignment. Multilevel degenerative disc disease most pronounced C4-5, C5-6 and C6-7. Craniocervical junction is intact. No acute cervical spine fracture. Prevertebral soft tissues unremarkable. Right greater than left apical pleural parenchymal thickening. Nondisplaced left first rib fracture. This is best demonstrated on image 54; series 7. Nondisplaced rib fractures through the posterior medial right first and third ribs (image 45; series 6) and (image 62; series 6). Mildly displaced fracture through the posterior medial right second rib (image 53; series 6). Possible nondisplaced fracture through the medial left fourth rib (image 256; series 5). IMPRESSION: Bilateral rib fractures involving the posterior medial right first, second and third ribs. Nondisplaced fracture through the posterior left first rib. Possible nondisplaced fracture through the medial left fourth rib. No acute intracranial process. Old right basal ganglia lacunar infarct. No acute cervical spine fracture. Soft tissue swelling overlying  the left frontal calvarium without underlying skull fracture. Electronically Signed   By: Lovey Newcomer M.D.   On: 07/02/2016 16:03   Ct Chest W Contrast  Result Date: 07/02/2016 CLINICAL DATA:  Fall down 13 stairs with rib pain and forehead hematoma. EXAM: CT CHEST, ABDOMEN, AND PELVIS WITH CONTRAST TECHNIQUE: Multidetector CT imaging of the chest, abdomen and pelvis was performed following the standard protocol during bolus administration of intravenous contrast. CONTRAST:  56mL ISOVUE-300 IOPAMIDOL (ISOVUE-300) INJECTION 61% COMPARISON:  None. FINDINGS: CT CHEST FINDINGS Cardiovascular: Pacemaker over the left chest wall. There is mild cardiomegaly. Mild calcified plaque over the left anterior descending coronary artery. Calcified plaque over the thoracic aorta. Remaining vascular structures are within normal. Mediastinum/Nodes: No evidence of mediastinal or hilar adenopathy. Remaining mediastinal structures are within normal. Lungs/Pleura: Lungs are well inflated with mild bibasilar dependent atelectasis. Tiny sliver of a pneumothorax over the anterior right midlung tiny adjacent contusion. Possible tiny amount of air just anterior to the heart. Adjacent Airways are normal. Musculoskeletal: Subtle fractures of the posterior medial aspect at the costovertebral junction of the right second and third ribs. There is a moderate T4 compression fracture age indeterminate. There is also a mild compression deformity of T7 age indeterminate. There are degenerative changes of the spine. CT ABDOMEN PELVIS FINDINGS Hepatobiliary: Previous cholecystectomy. Liver and biliary tree are otherwise within normal. Pancreas: Within normal. Spleen: Within normal. Adrenals/Urinary Tract: Adrenal glands are normal. Atrophic left kidney with a couple simple cysts over the upper pole. Right kidney is normal in size without hydronephrosis. There is  a 4 mm stone over the posterior mid to upper pole. There are multiple right renal cysts.  Bladder is unremarkable. Visualized ureters are unremarkable. Stomach/Bowel: Stomach and small bowel are within normal. Appendix is normal. Colon is unremarkable. There is moderate fecal retention throughout the colon. Vascular/Lymphatic: There is calcified plaque over the abdominal aorta and iliac arteries. There is no evidence adenopathy. Reproductive: Within normal. Other: Minimal free pelvic fluid. No focal inflammatory change. No free peritoneal air. Musculoskeletal: Degenerative change of the spine. IMPRESSION: Tiny sliver of a right pneumothorax over the anterior mid lung with small associated pulmonary contusion. Fractures of the posterior medial costovertebral junction of the right second and third ribs. No acute findings in the abdomen/ pelvis. Mild cardiomegaly and minimal atherosclerotic coronary artery disease. Aortic atherosclerosis. Atrophic left kidney.  Bilateral renal cysts. Small amount free fluid in the pelvis. Compression fractures of T4 and T7 age indeterminate but likely chronic. Critical Value/emergent results were called by telephone at the time of interpretation on 07/02/2016 at 6:42 pm to Dr. Malvin Johns, who verbally acknowledged these results. Electronically Signed   By: Marin Olp M.D.   On: 07/02/2016 18:41   Ct Cervical Spine Wo Contrast  Result Date: 07/02/2016 CLINICAL DATA:  Patient status post fall down the stairs. Positive reported loss of consciousness. Soft tissue swelling and hematoma overlying the left frontal calvarium. EXAM: CT HEAD WITHOUT CONTRAST CT CERVICAL SPINE WITHOUT CONTRAST TECHNIQUE: Multidetector CT imaging of the head and cervical spine was performed following the standard protocol without intravenous contrast. Multiplanar CT image reconstructions of the cervical spine were also generated. COMPARISON:  Brain CT 04/12/2015. FINDINGS: CT HEAD FINDINGS Brain: Ventricles and sulci are appropriate for patient's age. Periventricular and subcortical white  matter hypodensity compatible with chronic microvascular ischemic changes. Old right basal ganglia infarct. No evidence for acute cortically based infarct, intracranial hemorrhage, mass lesion or mass-effect. Vascular: Unremarkable. Skull: Intact. Sinuses/Orbits: Paranasal sinuses and mastoid air cells are well aerated. Orbits are unremarkable. Other: Soft tissue swelling overlying the left frontal calvarium. CT CERVICAL SPINE FINDINGS Normal anatomic alignment. Multilevel degenerative disc disease most pronounced C4-5, C5-6 and C6-7. Craniocervical junction is intact. No acute cervical spine fracture. Prevertebral soft tissues unremarkable. Right greater than left apical pleural parenchymal thickening. Nondisplaced left first rib fracture. This is best demonstrated on image 54; series 7. Nondisplaced rib fractures through the posterior medial right first and third ribs (image 45; series 6) and (image 62; series 6). Mildly displaced fracture through the posterior medial right second rib (image 53; series 6). Possible nondisplaced fracture through the medial left fourth rib (image 256; series 5). IMPRESSION: Bilateral rib fractures involving the posterior medial right first, second and third ribs. Nondisplaced fracture through the posterior left first rib. Possible nondisplaced fracture through the medial left fourth rib. No acute intracranial process. Old right basal ganglia lacunar infarct. No acute cervical spine fracture. Soft tissue swelling overlying the left frontal calvarium without underlying skull fracture. Electronically Signed   By: Lovey Newcomer M.D.   On: 07/02/2016 16:03   Ct Abdomen Pelvis W Contrast  Result Date: 07/02/2016 CLINICAL DATA:  Fall down 13 stairs with rib pain and forehead hematoma. EXAM: CT CHEST, ABDOMEN, AND PELVIS WITH CONTRAST TECHNIQUE: Multidetector CT imaging of the chest, abdomen and pelvis was performed following the standard protocol during bolus administration of  intravenous contrast. CONTRAST:  25mL ISOVUE-300 IOPAMIDOL (ISOVUE-300) INJECTION 61% COMPARISON:  None. FINDINGS: CT CHEST FINDINGS Cardiovascular: Pacemaker over the left chest wall. There  is mild cardiomegaly. Mild calcified plaque over the left anterior descending coronary artery. Calcified plaque over the thoracic aorta. Remaining vascular structures are within normal. Mediastinum/Nodes: No evidence of mediastinal or hilar adenopathy. Remaining mediastinal structures are within normal. Lungs/Pleura: Lungs are well inflated with mild bibasilar dependent atelectasis. Tiny sliver of a pneumothorax over the anterior right midlung tiny adjacent contusion. Possible tiny amount of air just anterior to the heart. Adjacent Airways are normal. Musculoskeletal: Subtle fractures of the posterior medial aspect at the costovertebral junction of the right second and third ribs. There is a moderate T4 compression fracture age indeterminate. There is also a mild compression deformity of T7 age indeterminate. There are degenerative changes of the spine. CT ABDOMEN PELVIS FINDINGS Hepatobiliary: Previous cholecystectomy. Liver and biliary tree are otherwise within normal. Pancreas: Within normal. Spleen: Within normal. Adrenals/Urinary Tract: Adrenal glands are normal. Atrophic left kidney with a couple simple cysts over the upper pole. Right kidney is normal in size without hydronephrosis. There is a 4 mm stone over the posterior mid to upper pole. There are multiple right renal cysts. Bladder is unremarkable. Visualized ureters are unremarkable. Stomach/Bowel: Stomach and small bowel are within normal. Appendix is normal. Colon is unremarkable. There is moderate fecal retention throughout the colon. Vascular/Lymphatic: There is calcified plaque over the abdominal aorta and iliac arteries. There is no evidence adenopathy. Reproductive: Within normal. Other: Minimal free pelvic fluid. No focal inflammatory change. No free  peritoneal air. Musculoskeletal: Degenerative change of the spine. IMPRESSION: Tiny sliver of a right pneumothorax over the anterior mid lung with small associated pulmonary contusion. Fractures of the posterior medial costovertebral junction of the right second and third ribs. No acute findings in the abdomen/ pelvis. Mild cardiomegaly and minimal atherosclerotic coronary artery disease. Aortic atherosclerosis. Atrophic left kidney.  Bilateral renal cysts. Small amount free fluid in the pelvis. Compression fractures of T4 and T7 age indeterminate but likely chronic. Critical Value/emergent results were called by telephone at the time of interpretation on 07/02/2016 at 6:42 pm to Dr. Malvin Johns, who verbally acknowledged these results. Electronically Signed   By: Marin Olp M.D.   On: 07/02/2016 18:41   Dg Hand Complete Left  Result Date: 07/02/2016 CLINICAL DATA:  Left hand pain with swelling and bruising posteriorly. Fall. EXAM: LEFT HAND - COMPLETE 3+ VIEW COMPARISON:  None. FINDINGS: Mild degenerative change over the radiocarpal joint and carpal bones. Minimal degenerative change of the interphalangeal joints. Mild diffuse osteopenia. There is a minimally displaced fracture of the first distal phalanx. IMPRESSION: Minimally displaced fracture of the first distal phalanx. Electronically Signed   By: Marin Olp M.D.   On: 07/02/2016 21:13    Anti-infectives: Anti-infectives    None      Assessment/Plan: s/p  Advance diet Plan for discharge tomorrow Restart Eliquis  Transfer to the floor with telemetry.  LOS: 2 days   Kathryne Eriksson. Dahlia Bailiff, MD, FACS [phone] Trauma Surgeon 07/04/2016

## 2016-07-04 NOTE — Progress Notes (Signed)
ANTICOAGULATION CONSULT NOTE - Initial Consult  Pharmacy Consult for apixiban Indication: atrial fibrillation  Allergies  Allergen Reactions  . Biaxin [Clarithromycin] Shortness Of Breath and Rash    Unknown  . Penicillins Hives    Has patient had a PCN reaction causing immediate rash, facial/tongue/throat swelling, SOB or lightheadedness with hypotension: Yes Has patient had a PCN reaction causing severe rash involving mucus membranes or skin necrosis: No Has patient had a PCN reaction that required hospitalization No Has patient had a PCN reaction occurring within the last 10 years: No If all of the above answers are "NO", then may proceed with Cephalosporin use.    . Shellfish Allergy Nausea And Vomiting  . Sulfa Antibiotics Other (See Comments)    "bad reaction," per patient  . Celebrex [Celecoxib] Rash     Vital Signs: Temp: 98.8 F (37.1 C) (09/18 0735) Temp Source: Oral (09/18 0735) BP: 139/74 (09/18 0735) Pulse Rate: 65 (09/18 0735)  Labs:  Recent Labs  07/02/16 1438 07/03/16 0350  HGB 11.8* 10.4*  HCT 37.4 33.9*  PLT 169 148*  LABPROT 16.7*  --   INR 1.34  --   CREATININE 1.35* 1.24*    CrCl cannot be calculated (Unknown ideal weight.).   Medical History: Past Medical History:  Diagnosis Date  . A-fib ([place])   . Anxiety   . Bradycardia    s/p PPM October 2014  . Chronic anticoagulation   . Headache(784.0)   . High cholesterol   . Hypertension   . Stroke [facility]) September 2014   thrombolytic therapy  . Thyroid disease   . Vertical vertigo     Medications:  Prescriptions Prior to Admission  Medication Sig Dispense Refill Last Dose  . acetaminophen (TYLENOL) 500 MG tablet Take 500-1,000 mg by mouth every 6 (six) hours as needed for mild pain.   Past Month at Unknown time  . ALPRAZolam (XANAX) 0.5 MG tablet Take 0.125 mg by mouth daily as needed for sleep or anxiety.    Past Month at Unknown time  . apixaban (ELIQUIS) 5 MG TABS tablet Take 1  tablet (5 mg total) by mouth 2 (two) times daily. 60 tablet 11 07/02/2016 at am  . atorvastatin (LIPITOR) 10 MG tablet Take 10 mg by mouth daily.   07/01/2016 at pm  . cholecalciferol (VITAMIN D) 1000 UNITS tablet Take 1,000 Units by mouth daily.    07/01/2016 at pm  . flecainide (TAMBOCOR) 150 MG tablet Take 0.5 tablets (75 mg total) by mouth 2 (two) times daily. 30 tablet 11 07/02/2016 at 0800  . levothyroxine (SYNTHROID, LEVOTHROID) 88 MCG tablet Take 88 mcg by mouth daily before breakfast.   07/02/2016 at am  . lisinopril (PRINIVIL,ZESTRIL) 10 MG tablet Take 10 mg by mouth 2 (two) times daily.   07/02/2016 at am  . loratadine (CLARITIN) 10 MG tablet TAKE 1 TABLET (10 MG TOTAL) BY MOUTH DAILY. (Patient taking differently: Take 10 mg by mouth once a day as needed for allergies) 30 tablet 2 Taking  . meclizine (ANTIVERT) 25 MG tablet Take 25 mg by mouth 3 (three) times daily as needed for dizziness.   07/01/2016 at pm  . metoprolol succinate (TOPROL-XL) 25 MG 24 hr tablet TAKE 1 TABLET BY MOUTH 3 TIMES DAILY. (Patient taking differently: Take 25 mg by mouth three times a day) 270 tablet 3 07/02/2016 at 0800  . omeprazole (PRILOSEC) 20 MG capsule Take 20 mg by mouth at bedtime.    07/01/2016 at 2000  Scheduled:  . flecainide  75 mg Oral BID  . levothyroxine  88 mcg Oral QAC breakfast  . lisinopril  10 mg Oral BID  . metoprolol succinate  25 mg Oral Q8H  . traMADol  50 mg Oral Q6H    Assessment: 82 yo female s/p fall with hematoma on her forehead. She was on apixiban PTA for afib and pharmacy has been consulted to restart. -Hg 11.8>>10.4, plt= 148   Goal of Therapy:  Monitor platelets by anticoagulation protocol: Yes   Plan:  -apixiban 5mg  po bid -Will follow patient progress  Hildred Laser, Pharm D 07/04/2016 10:31 AM

## 2016-07-04 NOTE — Progress Notes (Signed)
Report called to Remo Lipps for 6 [employer] 25.

## 2016-07-05 DIAGNOSIS — S27329A Contusion of lung, unspecified, initial encounter: Secondary | ICD-10-CM | POA: Diagnosis present

## 2016-07-05 DIAGNOSIS — D62 Acute posthemorrhagic anemia: Secondary | ICD-10-CM | POA: Diagnosis present

## 2016-07-05 DIAGNOSIS — Z7901 Long term (current) use of anticoagulants: Secondary | ICD-10-CM

## 2016-07-05 DIAGNOSIS — S62639A Displaced fracture of distal phalanx of unspecified finger, initial encounter for closed fracture: Secondary | ICD-10-CM | POA: Diagnosis present

## 2016-07-05 DIAGNOSIS — W108XXA Fall (on) (from) other stairs and steps, initial encounter: Secondary | ICD-10-CM | POA: Diagnosis present

## 2016-07-05 DIAGNOSIS — S2241XA Multiple fractures of ribs, right side, initial encounter for closed fracture: Secondary | ICD-10-CM | POA: Diagnosis not present

## 2016-07-05 DIAGNOSIS — S270XXA Traumatic pneumothorax, initial encounter: Secondary | ICD-10-CM | POA: Diagnosis present

## 2016-07-05 LAB — CBC WITH DIFFERENTIAL/PLATELET
BASOS ABS: 0 10*3/uL (ref 0.0–0.1)
BASOS PCT: 0 %
Eosinophils Absolute: 0.2 10*3/uL (ref 0.0–0.7)
Eosinophils Relative: 3 %
HEMATOCRIT: 27.6 % — AB (ref 36.0–46.0)
HEMOGLOBIN: 8.8 g/dL — AB (ref 12.0–15.0)
LYMPHS PCT: 16 %
Lymphs Abs: 1.2 10*3/uL (ref 0.7–4.0)
MCH: 30.1 pg (ref 26.0–34.0)
MCHC: 31.9 g/dL (ref 30.0–36.0)
MCV: 94.5 fL (ref 78.0–100.0)
MONO ABS: 0.8 10*3/uL (ref 0.1–1.0)
Monocytes Relative: 11 %
NEUTROS ABS: 5.2 10*3/uL (ref 1.7–7.7)
NEUTROS PCT: 70 %
Platelets: 135 10*3/uL — ABNORMAL LOW (ref 150–400)
RBC: 2.92 MIL/uL — AB (ref 3.87–5.11)
RDW: 12.9 % (ref 11.5–15.5)
WBC: 7.4 10*3/uL (ref 4.0–10.5)

## 2016-07-05 MED ORDER — TRAMADOL HCL 50 MG PO TABS
50.0000 mg | ORAL_TABLET | Freq: Four times a day (QID) | ORAL | Status: DC | PRN
Start: 2016-07-05 — End: 2016-07-07
  Administered 2016-07-06: 100 mg via ORAL
  Administered 2016-07-06 – 2016-07-07 (×2): 50 mg via ORAL
  Filled 2016-07-05 (×4): qty 1
  Filled 2016-07-05: qty 2

## 2016-07-05 MED ORDER — POLYETHYLENE GLYCOL 3350 17 G PO PACK
17.0000 g | PACK | Freq: Every day | ORAL | Status: DC
  Administered 2016-07-05 – 2016-07-07 (×3): 17 g via ORAL
  Filled 2016-07-05 (×3): qty 1

## 2016-07-05 MED ORDER — DOCUSATE SODIUM 100 MG PO CAPS
100.0000 mg | ORAL_CAPSULE | Freq: Two times a day (BID) | ORAL | Status: DC
  Administered 2016-07-05 – 2016-07-07 (×5): 100 mg via ORAL
  Filled 2016-07-05 (×5): qty 1

## 2016-07-05 MED ORDER — MORPHINE SULFATE (PF) 2 MG/ML IV SOLN: 2.0000 mg | INTRAVENOUS | Status: DC | PRN

## 2016-07-05 NOTE — Care Management Note (Signed)
Case Management Note  Patient Details  Name: Jessica Myers MRN: [id number] Date of Birth: 08/24/1929  Subjective/Objective: Pt admitted on 07/02/16 s/p fall down 13 stairs.  Pt sustained Lt forehead contusion, Rt occult PTX with pulmonary contusion, fractured Rt 2-3 ribs, T4-T7 compression fx, and Lt first distal phalanx fx.  PTA, pt independent, lives with spouse.                    Action/Plan: Met with pt, husband and granddaughter to discuss discharge planning.   G-daughter is a [employer] scientist, and lives in [place], [place].  PT recommending HHPT and RW with 24h supervision.  Family states 24h supervision can be provided at dc.  OT consult pending; will continue to follow for home needs.    Expected Discharge Date:   Expected Discharge Plan:  [place]  In-House Referral:     Discharge planning Services  CM Consult  Post Acute Care Choice:    Choice offered to:     DME Arranged:    DME Agency:     HH Arranged:    [place] Agency:     Status of Service:  In process, will continue to follow  If discussed at Long Length of Stay Meetings, dates discussed:    Additional Comments:  Reinaldo Raddle, RN, BSN  Trauma/Neuro ICU Case Manager [phone]

## 2016-07-05 NOTE — Care Management Important Message (Signed)
Important Message  Patient Details  Name: Jessica Myers MRN: [id number] Date of Birth: 1929/01/22   Medicare Important Message Given:  Yes    Jann Milkovich 07/05/2016, 11:03 AM

## 2016-07-05 NOTE — Evaluation (Signed)
Occupational Therapy Evaluation Patient Details Name: Jessica Myers MRN: [id number] DOB: 04-14-1929 Today's Date: 07/05/2016    History of Present Illness pt presents after a fall down stairs sustaining R 2-3 rib fxs, R PTX, and L thumb fx.  pt with hx of A-fib, Anxiety, Vertigo, HTN, CVA, and Pacemaker.   Clinical Impression   Pt was active in the community, driving and independent in ADL. 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 Pt reports many falls. Discussed home safety and fall prevention at length with pt and her supportive family. Will follow acutely.    Follow Up Recommendations  Home health OT;Supervision/Assistance - 24 hour    Equipment Recommendations  3 in 1 bedside comode    Recommendations for Other Services       Precautions / Restrictions Precautions Precautions: Fall Precaution Comments: Watch BP, pt with hx of multiple falls Restrictions Weight Bearing Restrictions: No      Mobility Bed Mobility Overal bed mobility: Modified Independent             General bed mobility comments: increased time, use of rail, HOB up 20 degrees  Transfers Overall transfer level: Needs assistance Equipment used: 1 person hand held assist Transfers: Sit to/from Stand Sit to Stand: Min guard         General transfer comment: used hand held assist    Balance     Sitting balance-Leahy Scale: Good       Standing balance-Leahy Scale: Poor                              ADL Overall ADL's : Needs assistance/impaired Eating/Feeding: Set up;Sitting   Grooming: Wash/dry hands;Min guard;Standing   Upper Body Bathing: Supervision/ safety;Sitting   Lower Body Bathing: Minimal assistance;Sit to/from stand   Upper Body Dressing : Set up;Sitting   Lower Body Dressing: Sit to/from stand;Minimal assistance   Toilet Transfer: Minimal assistance;Ambulation;Comfort height toilet;Grab bars   Toileting- Clothing Manipulation and Hygiene:  Minimal assistance;Sit to/from stand Toileting - Clothing Manipulation Details (indicate cue type and reason): assist to pull up panties, keep gown out of toilet     Functional mobility during ADLs: Minimal assistance (hand held) General ADL Comments: Long conversation about fall prevention with pt and 3 granddaughters. Pt uses a towel rod to pull up from standard toilet, recommended 3 in 1. Pt typically gets down in tub, recommended tub bench and use of hand held shower head. Pt with vertigo and has experienced dizziness with picking items up from floor, recommended reacher. Discussed life alert and safer options as pts' bed is very high.     Vision     Perception     Praxis      Pertinent Vitals/Pain Pain Assessment: Faces Faces Pain Scale: Hurts even more Pain Location: all over Pain Descriptors / Indicators: Sore;Grimacing;Guarding Pain Intervention(s): Monitored during session;Repositioned;Premedicated before session     Hand Dominance Right   Extremity/Trunk Assessment Upper Extremity Assessment Upper Extremity Assessment: RUE deficits/detail;LUE deficits/detail RUE Deficits / Details: generalized weakness, sore shoulder from fall, full AROM LUE Deficits / Details: fractured thumb, generalized weakness LUE Coordination: decreased fine motor   Lower Extremity Assessment Lower Extremity Assessment: Defer to PT evaluation   Cervical / Trunk Assessment Cervical / Trunk Assessment: Kyphotic   Communication Communication Communication: No difficulties   Cognition Arousal/Alertness: Awake/alert Behavior During Therapy: WFL for tasks assessed/performed Overall Cognitive Status: Within Functional Limits for tasks assessed  General Comments       Exercises       Shoulder Instructions      Home Living Family/patient expects to be discharged to:: Private residence Living Arrangements: Spouse/significant other Available Help at Discharge:  Family;Available 24 hours/day (spouse is also elderly) Type of Home: House Home Access: Stairs to enter [employer] of Steps: 7 Entrance Stairs-Rails: Left Home Layout: One level;Laundry or work area in basement     [employer] Shower/Tub: Risk analyst characteristics: Architectural technologist: Radio producer: None          Prior Functioning/Environment Level of Independence: Independent        Comments: pt drives, teaches at [employer], pt does admit that spouse performs most homemaking tasks, and that she typically holds on to furniture or walls when moving around her home.          OT Problem List: Decreased strength;Decreased activity tolerance;Impaired balance (sitting and/or standing);Decreased coordination;Decreased cognition;Decreased safety awareness;Decreased knowledge of use of DME or AE;Impaired UE functional use;Pain   OT Treatment/Interventions: Self-care/ADL training;DME and/or AE instruction;Patient/family education;Therapeutic activities    OT Goals(Current goals can be found in the care plan section) Acute Rehab OT Goals Patient Stated Goal: Home OT Goal Formulation: With patient Time For Goal Achievement: 07/12/16 Potential to Achieve Goals: Good ADL Goals Pt Will Perform Grooming: with supervision;standing Pt Will Perform Lower Body Bathing: with supervision;sit to/from stand Pt Will Perform Lower Body Dressing: with supervision;sit to/from stand Pt Will Transfer to Toilet: with supervision;ambulating;bedside commode (over toilet) Pt Will Perform Toileting - Clothing Manipulation and hygiene: with supervision;sit to/from stand Pt Will Perform Tub/Shower Transfer: Tub transfer;ambulating;tub bench;rolling walker;with supervision Additional ADL Goal #1: Pt will gather items for ADL around room safely with RW.  OT Frequency: Min 2X/week   Barriers to D/C:            Co-evaluation              End of Session  Equipment Utilized During Treatment: Rolling walker;Gait belt  Activity Tolerance: Patient tolerated treatment well Patient left: in bed;with call bell/phone within reach;with family/visitor present   Time: [id number] OT Time Calculation (min): 55 min Charges:  OT General Charges $OT Visit: 1 Procedure OT Evaluation $OT Eval Moderate Complexity: 1 Procedure OT Treatments $Self Care/Home Management : 38-52 mins G-Codes:    Malka So 07/05/2016, 4:59 PM  [phone]

## 2016-07-05 NOTE — Progress Notes (Signed)
Patient ID: Jessica Myers, female   DOB: Jun 27, 1929, 82 y.o.   MRN: [id number]   LOS: 3 days   Subjective: No unexpected c/o.   Objective: Vital signs in last 24 hours: Temp:  [98.1 F (36.7 C)-98.7 F (37.1 C)] 98.4 F (36.9 C) (09/19 0549) Pulse Rate:  [61-69] 69 (09/19 0549) Resp:  [18-19] 18 (09/19 0549) BP: (111-138)/(55-60) 125/59 (09/19 0549) SpO2:  [90 %-92 %] 90 % (09/19 0549) Last BM Date: 06/30/16   IS: 766ml   Laboratory  CBC  Recent Labs  07/03/16 0350 07/05/16 0257  WBC 8.4 7.4  HGB 10.4* 8.8*  HCT 33.9* 27.6*  PLT 148* 135*    Physical Exam General appearance: alert and no distress Resp: clear to auscultation bilaterally Cardio: regular rate and rhythm   Assessment/Plan: Fall down stairs Left first distal phalanx fracture -- Splinted Multiple right rib fxs w/pulmonary contusion, small PTX -- Pulmonary toilet Anticoagulated on Elequis ABL anemia -- Check tomorrow FEN -- No issues VTE -- SCD's Dispo -- PT/OT    Lisette Abu, PA-C Pager: [phone] General Trauma PA Pager: [phone]  07/05/2016

## 2016-07-05 NOTE — Evaluation (Signed)
Physical Therapy Evaluation Patient Details Name: Jessica Myers MRN: [id number] DOB: Aug 24, 1929 Today's Date: 07/05/2016   History of Present Illness  pt presents after a fall down stairs sustaining R 2-3 rib fxs, R PTX, and L thumb fx.  pt with hx of A-fib, Anxiety, Vertigo, HTN, CVA, and Pacemaker.  Clinical Impression  Pt generally unsteady and per pt report may have been unsteady at home for some time.  During ambulation pt indicated feeling lightheaded and like she would pass out.  BP taken in sitting 131/65 and after standing 3 mins 93/56 with reports of lightheadedness.  RN made aware.  Will continue to follow and would benefit from continued therapies.      Follow Up Recommendations Home health PT;Supervision/Assistance - 24 hour    Equipment Recommendations  Rolling walker with 5" wheels    Recommendations for Other Services       Precautions / Restrictions Precautions Precautions: Fall Precaution Comments: Watch BP Restrictions Weight Bearing Restrictions: No      Mobility  Bed Mobility Overal bed mobility: Needs Assistance Bed Mobility: Supine to Sit     Supine to sit: Min assist     General bed mobility comments: A with bringing trunk up to sitting.    Transfers Overall transfer level: Needs assistance Equipment used: None;Rolling walker (2 wheeled) Transfers: Sit to/from Stand Sit to Stand: Min assist;Min guard         General transfer comment: pt initially came to standing without AD, but needed MinA for steadying.  With use of RW pt able to perform with only MinG.    Ambulation/Gait Ambulation/Gait assistance: Min guard;Min assist Ambulation Distance (Feet): 40 Feet Assistive device: None;Rolling walker (2 wheeled) Gait Pattern/deviations: Step-through pattern;Decreased stride length     General Gait Details: pt initially ambulating without AD and needed MinA for balance, but once given RW pt was MinG.  pt indicated during ambulation feeling  lightheaded and like she was going to pass out.  BP checked in sitting and standing and found to be [id number] systolic and dropped to 93 systolic after standing 3 mins.    Stairs            Wheelchair Mobility    Modified Rankin (Stroke Patients Only)       Balance Overall balance assessment: Needs assistance Sitting-balance support: No upper extremity supported;Feet supported Sitting balance-Leahy Scale: Good     Standing balance support: Single extremity supported;Bilateral upper extremity supported;No upper extremity supported;During functional activity Standing balance-Leahy Scale: Poor                               Pertinent Vitals/Pain Pain Assessment: 0-10 Pain Score: 5  Pain Location: R ribs and shoulder Pain Descriptors / Indicators: Aching;Grimacing;Guarding Pain Intervention(s): Monitored during session;Repositioned;Premedicated before session    Home Living Family/patient expects to be discharged to:: Private residence Living Arrangements: Spouse/significant other Available Help at Discharge: Family;Available 24 hours/day Type of Home: House Home Access: Stairs to enter Entrance Stairs-Rails: Left Entrance Stairs-Number of Steps: 7 Home Layout: One level;Laundry or work area in [employer]: None      Prior Function Level of Independence: Independent         Comments: pt does admit that spouse performs most homemaking tasks, and that she typically holds on to furniture or walls when moving around her home.       Hand Dominance  Extremity/Trunk Assessment   Upper Extremity Assessment: Defer to OT evaluation           Lower Extremity Assessment: Generalized weakness      Cervical / Trunk Assessment: Kyphotic  Communication   Communication: No difficulties  Cognition Arousal/Alertness: Awake/alert Behavior During Therapy: WFL for tasks assessed/performed Overall Cognitive Status: Within Functional Limits for  tasks assessed                      General Comments      Exercises     Assessment/Plan    PT Assessment Patient needs continued PT services  PT Problem List Decreased strength;Decreased activity tolerance;Decreased balance;Decreased mobility;Decreased knowledge of use of DME;Cardiopulmonary status limiting activity          PT Treatment Interventions DME instruction;Gait training;Stair training;Functional mobility training;Therapeutic activities;Therapeutic exercise;Balance training;Patient/family education    PT Goals (Current goals can be found in the Care Plan section)  Acute Rehab PT Goals Patient Stated Goal: Home PT Goal Formulation: With patient Time For Goal Achievement: 07/12/16 Potential to Achieve Goals: Good    Frequency Min 3X/week   Barriers to discharge        Co-evaluation               End of Session Equipment Utilized During Treatment: Gait belt Activity Tolerance: Patient tolerated treatment well Patient left: in chair;with call bell/phone within reach;with family/visitor present Nurse Communication: Mobility status         Time: [id number] PT Time Calculation (min) (ACUTE ONLY): 42 min   Charges:   PT Evaluation $PT Eval Moderate Complexity: 1 Procedure PT Treatments $Gait Training: 8-22 mins $Therapeutic Activity: 8-22 mins   PT G CodesCatarina Hartshorn, [place] 07/05/2016, 12:23 PM

## 2016-07-06 ENCOUNTER — Encounter (HOSPITAL_COMMUNITY): Payer: Self-pay | Admitting: General Practice

## 2016-07-06 DIAGNOSIS — S2241XA Multiple fractures of ribs, right side, initial encounter for closed fracture: Secondary | ICD-10-CM | POA: Diagnosis not present

## 2016-07-06 DIAGNOSIS — S27321A Contusion of lung, unilateral, initial encounter: Secondary | ICD-10-CM | POA: Diagnosis not present

## 2016-07-06 DIAGNOSIS — D62 Acute posthemorrhagic anemia: Secondary | ICD-10-CM | POA: Diagnosis not present

## 2016-07-06 DIAGNOSIS — S270XXA Traumatic pneumothorax, initial encounter: Secondary | ICD-10-CM | POA: Diagnosis not present

## 2016-07-06 LAB — CBC
HCT: 28.5 % — ABNORMAL LOW (ref 36.0–46.0)
Hemoglobin: 9.1 g/dL — ABNORMAL LOW (ref 12.0–15.0)
MCH: 30.5 pg (ref 26.0–34.0)
MCHC: 31.9 g/dL (ref 30.0–36.0)
MCV: 95.6 fL (ref 78.0–100.0)
PLATELETS: 158 10*3/uL (ref 150–400)
RBC: 2.98 MIL/uL — ABNORMAL LOW (ref 3.87–5.11)
RDW: 12.9 % (ref 11.5–15.5)
WBC: 7.1 10*3/uL (ref 4.0–10.5)

## 2016-07-06 NOTE — Progress Notes (Signed)
Occupational Therapy Treatment Patient Details Name: Jessica Myers MRN: [id number] DOB: [birth year] Today's Date: 07/06/2016    History of present illness pt presents after a fall down stairs sustaining R 2-3 rib fxs, R PTX, and L thumb fx.  pt with hx of A-fib, Anxiety, Vertigo, HTN, CVA, and Pacemaker.   OT comments  Demonstrated use of tub transfer bench and also educated on option of using a tub seat and grab bar on edge of tub. Pt instructed in use of reacher to avoid inverting head which increases dizziness and risk of fall. Pt stating she is not likely to use walker at home, but is agreeable to a cane, communicated with PT.     Follow Up Recommendations  Home health OT;Supervision/Assistance - 24 hour    Equipment Recommendations  3 in 1 bedside comode    Recommendations for Other Services      Precautions / Restrictions Precautions Precautions: Fall Restrictions Weight Bearing Restrictions: No Other Position/Activity Restrictions: pt cued herself to stand momentarily before ambulating       Mobility Bed Mobility Overal bed mobility: Modified Independent             General bed mobility comments: increased time, use of rail, HOB up 20 degrees  Transfers Overall transfer level: Needs assistance Equipment used: 1 person hand held assist Transfers: Sit to/from Stand Sit to Stand: Min assist         General transfer comment: pt encouraged to push up from sitting surface, attempting to reach and pull up on therapist, steadying assist    Balance     Sitting balance-Leahy Scale: Good       Standing balance-Leahy Scale: Poor                     ADL Overall ADL's : Needs assistance/impaired     Grooming: Wash/dry hands;Standing;Brushing hair;Min guard;Moderate assistance Grooming Details (indicate cue type and reason): pt with poor standing tolerance due to back pain         Upper Body Dressing : Minimal assistance;Standing Upper Body  Dressing Details (indicate cue type and reason): front opening gown     Toilet Transfer: Minimal assistance;Ambulation;Comfort height toilet;Grab bars;BSC (BSC over toilet)   Toileting- Clothing Manipulation and Hygiene: Min guard;Sit to/from Nurse, children's Details (indicate cue type and reason): educated pt and family in use of tub transfer bench, pt thinks it would be in the way, also discussed use of shower seat with grab bar and hand held shower head Functional mobility during ADLs: Minimal assistance (hand held) General ADL Comments: Pt stating she is not likely to use RW. Husband nervous about taking her home. Communicated with PT in engage husband and that pt prefers a cane      Vision                     Perception     Praxis      Cognition   Behavior During Therapy: [facility] for tasks assessed/performed Overall Cognitive Status: Within Functional Limits for tasks assessed                       Extremity/Trunk Assessment               Exercises     Shoulder Instructions       General Comments      Pertinent Vitals/ Pain       Pain  Assessment: Faces Faces Pain Scale: Hurts even more Pain Location: back, ribs Pain Descriptors / Indicators: Aching;Grimacing;Guarding Pain Intervention(s): Monitored during session;Premedicated before session;Repositioned  Home Living                                          Prior Functioning/Environment              Frequency  Min 2X/week        Progress Toward Goals  OT Goals(current goals can now be found in the care plan section)  Progress towards OT goals: Progressing toward goals  Acute Rehab OT Goals Patient Stated Goal: Home Time For Goal Achievement: 07/12/16 Potential to Achieve Goals: Good  Plan Discharge plan remains appropriate    Co-evaluation                 End of Session Equipment Utilized During Treatment: Gait belt   Activity  Tolerance Patient limited by pain   Patient Left in chair;with call bell/phone within reach;with nursing/sitter in room   Nurse Communication Mobility status        Time: [id number] OT Time Calculation (min): 39 min  Charges: OT General Charges $OT Visit: 1 Procedure OT Treatments $Self Care/Home Management : 38-52 mins  Malka So 07/06/2016, 10:23 AM  [phone]

## 2016-07-06 NOTE — Discharge Instructions (Signed)

## 2016-07-06 NOTE — Progress Notes (Signed)
Patient ID: UDY PORE, female   DOB: January 15, 1929, 82 y.o.   MRN: [id number]   LOS: 4 days   Subjective: Feeling good, no further dizzy episodes.   Objective: Vital signs in last 24 hours: Temp:  [98 F (36.7 C)-98.2 F (36.8 C)] 98.2 F (36.8 C) (09/20 0501) Pulse Rate:  [60-63] 60 (09/20 0501) Resp:  [18] 18 (09/20 0501) BP: (129-146)/(57-72) 129/58 (09/20 0501) SpO2:  [92 %-93 %] 93 % (09/20 0501) Weight:  [64.9 kg (143 lb)] 64.9 kg (143 lb) (09/19 1140) Last BM Date: 06/30/16   Laboratory  CBC  Recent Labs  07/05/16 0257 07/06/16 0533  WBC 7.4 7.1  HGB 8.8* 9.1*  HCT 27.6* 28.5*  PLT 135* 158    Physical Exam General appearance: alert and no distress Resp: clear to auscultation bilaterally Cardio: regular rate and rhythm   Assessment/Plan: Fall down stairs Left first distal phalanx fracture -- Splinted Multiple right rib fxs w/pulmonary contusion, small PTX -- Pulmonary toilet Anticoagulated on Elequis ABL anemia -- Stable FEN -- No issues VTE -- SCD's Dispo -- PT/OT, likely home this afternoon after therapies    Lisette Abu, PA-C Pager: [phone] General Trauma PA Pager: [phone]  07/06/2016

## 2016-07-06 NOTE — Progress Notes (Signed)
Physical Therapy Treatment Patient Details Name: Jessica Myers MRN: [id number] DOB: [birth year] Today's Date: 07/06/2016    History of Present Illness pt presents after a fall down stairs sustaining R 2-3 rib fxs, R PTX, and L thumb fx.  pt with hx of A-fib, Anxiety, Vertigo, HTN, CVA, and Pacemaker.    PT Comments    Pt is progressing with mobility, she ambulated 77' with min assist. Lengthy discussion with pt/spouse/2 grandaughters about DC plan. Pt's spouse is feeling that pt is not ready to DC home, he's concerned about her pain and ability to tolerate long ride home and manage stairs to get into home. She would benefit from one more PT visit for stair training. Discussed ST-SNF option, pt declined. Will notify RN of pt/family's concerns.    Follow Up Recommendations  Home health PT;Supervision/Assistance - 24 hour; Home health aide     Equipment Recommendations  Rolling walker with 5" wheels;3in1 (PT)    Recommendations for Other Services       Precautions / Restrictions Precautions Precautions: Fall Precaution Comments:  pt with hx of multiple falls Restrictions Weight Bearing Restrictions: No Other Position/Activity Restrictions: pt cued herself to stand momentarily before ambulating    Mobility  Bed Mobility Overal bed mobility: Modified Independent Bed Mobility: Sit to Supine       Sit to supine: Modified independent (Device/Increase time)   General bed mobility comments: increased time, use of rail  Transfers Overall transfer level: Needs assistance Equipment used: Straight cane Transfers: Sit to/from Stand Sit to Stand: Min guard         General transfer comment: VCs to use armrests to push up, no physical assist needed, increased time, movement somewhat labored due to pain  Ambulation/Gait Ambulation/Gait assistance: Min assist Ambulation Distance (Feet): 75 Feet Assistive device: 1 person hand held assist;Straight cane Gait Pattern/deviations:  Decreased step length - right;Decreased step length - left;Step-through pattern   Gait velocity interpretation: Below normal speed for age/gender General Gait Details: pt stated she prefers to use SPC rather than RW, using SPC on R she required HHA (from her husband) on L and at end of walked stated she realizes the RW provides more support and would be better to use, no LOB, distance limited by pain/fatigue   Stairs            Wheelchair Mobility    Modified Rankin (Stroke Patients Only)       Balance     Sitting balance-Leahy Scale: Good       Standing balance-Leahy Scale: Poor                      Cognition Arousal/Alertness: Awake/alert Behavior During Therapy: WFL for tasks assessed/performed Overall Cognitive Status: Within Functional Limits for tasks assessed                      Exercises      General Comments        Pertinent Vitals/Pain Pain Assessment: Faces Pain Score: 4  Faces Pain Scale: Hurts even more Pain Location: back, ribs Pain Descriptors / Indicators: Sore Pain Intervention(s): Premedicated before session;Monitored during session;Limited activity within patient's tolerance    Home Living                      Prior Function            PT Goals (current goals can now be found in the care plan  section) Acute Rehab PT Goals Patient Stated Goal: Home PT Goal Formulation: With patient/family Time For Goal Achievement: 07/12/16 Potential to Achieve Goals: Good Progress towards PT goals: Progressing toward goals    Frequency    Min 3X/week      PT Plan      Co-evaluation             End of Session Equipment Utilized During Treatment: Gait belt Activity Tolerance: Patient limited by fatigue;Patient limited by pain Patient left: with call bell/phone within reach;with family/visitor present;in bed     Time: 1157-1229 PT Time Calculation (min) (ACUTE ONLY): 32 min  Charges:  $Gait Training:  8-22 mins $Therapeutic Activity: 8-22 mins                    G Codes:      Philomena Doheny 07/06/2016, 12:53 PM [phone]

## 2016-07-07 ENCOUNTER — Other Ambulatory Visit: Payer: Self-pay

## 2016-07-07 ENCOUNTER — Inpatient Hospital Stay
Admission: RE | Admit: 2016-07-07 | Discharge: 2016-08-04 | Disposition: A | Discharge status: Home / Self Care | Payer: PPO | Source: Ambulatory Visit | Attending: Internal Medicine | Admitting: Internal Medicine

## 2016-07-07 DIAGNOSIS — Z9181 History of falling: Secondary | ICD-10-CM | POA: Diagnosis not present

## 2016-07-07 DIAGNOSIS — M6281 Muscle weakness (generalized): Secondary | ICD-10-CM | POA: Diagnosis not present

## 2016-07-07 DIAGNOSIS — F411 Generalized anxiety disorder: Secondary | ICD-10-CM | POA: Diagnosis not present

## 2016-07-07 DIAGNOSIS — S62522D Displaced fracture of distal phalanx of left thumb, subsequent encounter for fracture with routine healing: Secondary | ICD-10-CM | POA: Diagnosis not present

## 2016-07-07 DIAGNOSIS — S27329D Contusion of lung, unspecified, subsequent encounter: Secondary | ICD-10-CM | POA: Diagnosis not present

## 2016-07-07 DIAGNOSIS — M79601 Pain in right arm: Secondary | ICD-10-CM | POA: Diagnosis not present

## 2016-07-07 DIAGNOSIS — Z950 Presence of cardiac pacemaker: Secondary | ICD-10-CM | POA: Diagnosis not present

## 2016-07-07 DIAGNOSIS — S27321A Contusion of lung, unilateral, initial encounter: Secondary | ICD-10-CM | POA: Diagnosis not present

## 2016-07-07 DIAGNOSIS — E039 Hypothyroidism, unspecified: Secondary | ICD-10-CM | POA: Diagnosis not present

## 2016-07-07 DIAGNOSIS — R51 Headache: Secondary | ICD-10-CM | POA: Diagnosis not present

## 2016-07-07 DIAGNOSIS — R55 Syncope and collapse: Secondary | ICD-10-CM | POA: Diagnosis not present

## 2016-07-07 DIAGNOSIS — E785 Hyperlipidemia, unspecified: Secondary | ICD-10-CM | POA: Diagnosis not present

## 2016-07-07 DIAGNOSIS — S2241XA Multiple fractures of ribs, right side, initial encounter for closed fracture: Secondary | ICD-10-CM | POA: Diagnosis not present

## 2016-07-07 DIAGNOSIS — Z7901 Long term (current) use of anticoagulants: Secondary | ICD-10-CM | POA: Diagnosis not present

## 2016-07-07 DIAGNOSIS — S270XXA Traumatic pneumothorax, initial encounter: Secondary | ICD-10-CM | POA: Diagnosis not present

## 2016-07-07 DIAGNOSIS — D500 Iron deficiency anemia secondary to blood loss (chronic): Secondary | ICD-10-CM | POA: Diagnosis not present

## 2016-07-07 DIAGNOSIS — R279 Unspecified lack of coordination: Secondary | ICD-10-CM | POA: Diagnosis not present

## 2016-07-07 DIAGNOSIS — D62 Acute posthemorrhagic anemia: Secondary | ICD-10-CM | POA: Diagnosis not present

## 2016-07-07 DIAGNOSIS — I482 Chronic atrial fibrillation: Secondary | ICD-10-CM | POA: Diagnosis not present

## 2016-07-07 DIAGNOSIS — I10 Essential (primary) hypertension: Secondary | ICD-10-CM | POA: Diagnosis not present

## 2016-07-07 DIAGNOSIS — S2241XD Multiple fractures of ribs, right side, subsequent encounter for fracture with routine healing: Secondary | ICD-10-CM | POA: Diagnosis not present

## 2016-07-07 MED ORDER — TRAMADOL HCL 50 MG PO TABS: 50.0000 mg | ORAL_TABLET | Freq: Four times a day (QID) | ORAL | 5 refills | Status: DC | PRN

## 2016-07-07 MED ORDER — TRAMADOL HCL 50 MG PO TABS
50.0000 mg | ORAL_TABLET | Freq: Four times a day (QID) | ORAL | 0 refills | Status: DC | PRN
Start: 2016-07-07 — End: 2016-07-07

## 2016-07-07 MED ORDER — ALPRAZOLAM 0.25 MG PO TABS: 0.1250 mg | ORAL_TABLET | Freq: Every day | ORAL | 0 refills | Status: DC | PRN

## 2016-07-07 MED ORDER — METHOCARBAMOL 500 MG PO TABS: 500.0000 mg | ORAL_TABLET | Freq: Four times a day (QID) | ORAL | Status: DC | PRN

## 2016-07-07 MED ORDER — ALPRAZOLAM 0.25 MG PO TABS: 0.1250 mg | ORAL_TABLET | Freq: Every day | ORAL | 5 refills | Status: DC | PRN

## 2016-07-07 NOTE — Telephone Encounter (Signed)
RX faxed to [facility] @ [phone]. Phone number [phone]  

## 2016-07-07 NOTE — Clinical Social Work Note (Signed)
Patient will discharge today per MD order. Patient will discharge to: [facility] RN to call report prior to transportation to: [phone] Transportation: [facility]- to be arranged after insurance authorization received.  CSW sent discharge summary to SNF for review.    Nonnie Done, MSW, LCSW  ([id number]) [phone]  Licensed Clinical Social Worker

## 2016-07-07 NOTE — Progress Notes (Signed)
Patient ID: Jessica Myers, female   DOB: [birth year], 82 y.o.   MRN: [id number]   LOS: 5 days   Subjective: No new c/o.   Objective: Vital signs in last 24 hours: Temp:  [98.1 F (36.7 C)-98.9 F (37.2 C)] 98.8 F (37.1 C) (09/21 0550) Pulse Rate:  [60-65] 62 (09/21 0550) Resp:  [18] 18 (09/21 0550) BP: (134-143)/(48-68) 140/56 (09/21 0550) SpO2:  [92 %-95 %] 92 % (09/21 0550) Last BM Date: 06/30/16   Physical Exam General appearance: alert and no distress Resp: clear to auscultation bilaterally Cardio: regular rate and rhythm GI: normal findings: bowel sounds normal and soft, non-tender   Assessment/Plan: Fall down stairs Left first distal phalanx fracture -- Splinted Multiple right rib fxs w/pulmonary contusion, small PTX -- Pulmonary toilet Anticoagulated on Elequis ABL anemia -- Stable FEN-- No issues VTE -- SCD's Dispo-- PT/OT, likely home this afternoon after stair training    Lisette Abu, PA-C Pager: [phone] General Trauma PA Pager: [phone]  07/07/2016

## 2016-07-07 NOTE — Progress Notes (Signed)
Report called to Kennon Rounds

## 2016-07-07 NOTE — Progress Notes (Signed)
Updated pt's granddaughter by phone, with pt's permission, regarding new plan for short term SNF for rehab.  Granddaughter made aware that CSW will follow up with family with bed offers/bed availability.    Will follow progress.  Reinaldo Raddle, RN, BSN  Trauma/Neuro ICU Case Manager [phone]

## 2016-07-07 NOTE — Clinical Social Work Placement (Signed)
   CLINICAL SOCIAL WORK PLACEMENT  NOTE  Date:  07/07/2016  Patient Details  Name: Jessica Myers MRN: [id number] Date of Birth: 1928-11-22  Clinical Social Work is seeking post-discharge placement for this patient at the [place] level of care (*CSW will initial, date and re-position this form in  chart as items are completed):  Yes   Patient/family provided with [place] Work Department's list of facilities offering this level of care within the geographic area requested by the patient (or if unable, by the patient's family).  Yes   Patient/family informed of their freedom to choose among providers that offer the needed level of care, that participate in Medicare, Medicaid or managed care program needed by the patient, have an available bed and are willing to accept the patient.  Yes   Patient/family informed of [facility]'s ownership interest in [facility] and [facility], as well as of the fact that they are under no obligation to receive care at these facilities.  PASRR submitted to EDS on 07/07/16     PASRR number received on 07/07/16     Existing PASRR number confirmed on       FL2 transmitted to all facilities in geographic area requested by pt/family on 07/07/16     FL2 transmitted to all facilities within larger geographic area on       Patient informed that his/her managed care company has contracts with or will negotiate with certain facilities, including the following:        Yes   Patient/family informed of bed offers received.  Patient chooses bed at       Physician recommends and patient chooses bed at      Patient to be transferred to   on  .  Patient to be transferred to facility by PTAR     Patient family notified on   of transfer.  Name of family member notified:        PHYSICIAN Please prepare priority discharge summary, including medications     Additional Comment:     _______________________________________________ Dulcy Fanny, LCSW 07/07/2016, 10:52 AM

## 2016-07-07 NOTE — Care Management Note (Signed)
Case Management Note  Patient Details  Name: Jessica Myers MRN: [id number] Date of Birth: April 04, 1929  Subjective/Objective:  Pt and family have decided to go to SNF for rehab; CSW consulted to facilitate dc to SNF.  Pt/family prefer [facility] in [place].                  Action/Plan: Plan dc to [facility] today, per CSW arrangements.   Expected Discharge Date:  07/05/16               Expected Discharge Plan:  Skilled Nursing Facility  In-House Referral:  Clinical Social Work  Discharge planning Services  CM Consult  Post Acute Care Choice:    Choice offered to:     DME Arranged:    DME Agency:     HH Arranged:    [place] Agency:     Status of Service:  Completed, signed off  If discussed at [employer], dates discussed:    Additional Comments:  Ella Bodo, RN 07/07/2016, 2:50 PM

## 2016-07-07 NOTE — Clinical Social Work Placement (Signed)
   CLINICAL SOCIAL WORK PLACEMENT  NOTE  Date:  07/07/2016  Patient Details  Name: Jessica Myers MRN: [id number] Date of Birth: 05/21/1929  Clinical Social Work is seeking post-discharge placement for this patient at the [place] level of care (*CSW will initial, date and re-position this form in  chart as items are completed):  Yes   Patient/family provided with [facility] Work Department's list of facilities offering this level of care within the geographic area requested by the patient (or if unable, by the patient's family).  Yes   Patient/family informed of their freedom to choose among providers that offer the needed level of care, that participate in Medicare, Medicaid or managed care program needed by the patient, have an available bed and are willing to accept the patient.  Yes   Patient/family informed of [facility]'s ownership interest in [facility] and [facility], as well as of the fact that they are under no obligation to receive care at these facilities.  PASRR submitted to EDS on 07/07/16     PASRR number received on 07/07/16     Existing PASRR number confirmed on       FL2 transmitted to all facilities in geographic area requested by pt/family on 07/07/16     FL2 transmitted to all facilities within larger geographic area on       Patient informed that his/her managed care company has contracts with or will negotiate with certain facilities, including the following:        Yes   Patient/family informed of bed offers received.  Patient chooses bed at [facility]     Physician recommends and patient chooses bed at      Patient to be transferred to [facility] on 07/07/16.  Patient to be transferred to facility by PTAR     Patient family notified on 07/07/16 of transfer.  Name of family member notified:  patient and husband [name] updated at bedside     PHYSICIAN Please prepare priority discharge  summary, including medications     Additional Comment:    _______________________________________________ Dulcy Fanny, LCSW 07/07/2016, 1:23 PM

## 2016-07-07 NOTE — Discharge Summary (Signed)
Physician Discharge Summary  Patient ID: Jessica Myers MRN: [id number] DOB/AGE: [birth year] 82 y.o.  Admit date: 07/02/2016 Discharge date: 07/07/2016  Discharge Diagnoses Patient Active Problem List   Diagnosis Date Noted  . Fall down stairs 07/05/2016  . Long-term (current) use of anticoagulants 07/05/2016  . Closed fracture of distal phalanx of digit of left hand 07/05/2016  . Pulmonary contusion 07/05/2016  . Traumatic pneumothorax 07/05/2016  . Acute blood loss anemia 07/05/2016  . Multiple fractures of ribs of right side 07/02/2016  . Musculoskeletal arm pain 01/03/2014  . Hoarseness, persistent 01/03/2014  . Pacemaker 10/24/2013  . Atrial fibrillation ([place]) 10/24/2013  . Syncope 07/25/2013  . Stroke ([place]) 07/16/2013  . Acute right MCA stroke ([place]) 07/16/2013  . Acute left hemiparesis ([place]) 07/16/2013  . Accelerated hypertension 07/16/2013  . Dyslipidemia, goal LDL below 100 07/16/2013  . Dysarthria as late effect of stroke 07/16/2013  . Unspecified hypothyroidism 07/16/2013  . Encounter for long-term (current) use of medications 07/16/2013    Consultants None   Procedures None   HPI: Jessica Myers presented after falling down approximately 13 stairs. She denied any presyncopal events stating that she just tripped and fell. The stairs were carpeted. She denied any loss of consciousness. She was evaluated by the emergency department with plain films and a CT scan of the head and neck. That showed no intracranial injury. There was a question of bilateral first rib fractures on the lower scans of the neck. Therefore a CT scan of the chest abdomen and pelvis was ordered. She was admitted by the trauma service.   Hospital Course: The patient did not suffer any respiratory compromise from her rib fractures. Her Elequis was briefly held then restarted. She developed an acute blood loss anemia that stabilized around 8 and she did not require any transfusions. Her pain was controlled  on oral medications. Her thumb fracture was placed in a splint and we did not feel it necessary to call hand surgery. She was mobilized with physical and occupational therapies who recommended home with home health but the patient and, more so, her husband did not feel that was a safe option. Therefore we secured a skilled nursing facility bed and she was discharged there for short-term rehabilitation in good condition.     Medication List    TAKE these medications   acetaminophen 500 MG tablet Commonly known as:  TYLENOL Take 500-1,000 mg by mouth every 6 (six) hours as needed for mild pain.   ALPRAZolam 0.25 MG tablet Commonly known as:  XANAX Take 0.5 tablets (0.125 mg total) by mouth daily as needed for sleep or anxiety. What changed:  medication strength   apixaban 5 MG Tabs tablet Commonly known as:  ELIQUIS Take 1 tablet (5 mg total) by mouth 2 (two) times daily.   atorvastatin 10 MG tablet Commonly known as:  LIPITOR Take 10 mg by mouth daily.   cholecalciferol 1000 units tablet Commonly known as:  VITAMIN D Take 1,000 Units by mouth daily.   flecainide 150 MG tablet Commonly known as:  TAMBOCOR Take 0.5 tablets (75 mg total) by mouth 2 (two) times daily.   levothyroxine 88 MCG tablet Commonly known as:  SYNTHROID, LEVOTHROID Take 88 mcg by mouth daily before breakfast.   lisinopril 10 MG tablet Commonly known as:  PRINIVIL,ZESTRIL Take 10 mg by mouth 2 (two) times daily.   loratadine 10 MG tablet Commonly known as:  CLARITIN TAKE 1 TABLET (10 MG TOTAL) BY MOUTH DAILY. What changed:  See the new instructions.   meclizine 25 MG tablet Commonly known as:  ANTIVERT Take 25 mg by mouth 3 (three) times daily as needed for dizziness.   methocarbamol 500 MG tablet Commonly known as:  ROBAXIN Take 1 tablet (500 mg total) by mouth every 6 (six) hours as needed for muscle spasms.   metoprolol succinate 25 MG 24 hr tablet Commonly known as:  TOPROL-XL TAKE 1 TABLET  BY MOUTH 3 TIMES DAILY. What changed:  See the new instructions.   omeprazole 20 MG capsule Commonly known as:  PRILOSEC Take 20 mg by mouth at bedtime.   traMADol 50 MG tablet Commonly known as:  ULTRAM Take 1-2 tablets (50-100 mg total) by mouth every 6 (six) hours as needed (Pain).       Follow-up Information    SHAH,ASHISH, MD .   Specialty:  Internal Medicine Contact information: [place] [address] [phone]        [facility] [place] .   Why:  Call as needed Contact information: [address] [id number] A[place] [phone]           Signed: Lisette Abu, PA-C Pager: [phone] General Trauma PA Pager: [phone] 07/07/2016, 12:11 PM

## 2016-07-07 NOTE — Progress Notes (Signed)
EMS unsure if pt was on PTAR list from earlier today.  Pt now placed on EMS list for pickup/tx to the [facility].  Creta Levin, LCSW Evening/ED Coverage [id number]

## 2016-07-07 NOTE — NC FL2 (Signed)
[place] MEDICAID FL2 LEVEL OF CARE SCREENING TOOL     IDENTIFICATION  Patient Name: Jessica Myers Birthdate: September 28, 1929 Sex: female Admission Date (Current Location): 07/02/2016  [facility] and [place] Number:  [employer] and Address:  [facility] [place] [place] [address], [place], [place] [address]      Provider Number: [id number]  Attending Physician Name and Address:  Trauma Md, MD  Relative Name and Phone Number:       Current Level of Care: Hospital Recommended Level of Care: [place] Prior Approval Number:    Date Approved/Denied:   PASRR Number: [id number]  Discharge Plan: SNF    Current Diagnoses: Patient Active Problem List   Diagnosis Date Noted  . Fall down stairs 07/05/2016  . Long-term (current) use of anticoagulants 07/05/2016  . Closed fracture of distal phalanx of digit of left hand 07/05/2016  . Pulmonary contusion 07/05/2016  . Traumatic pneumothorax 07/05/2016  . Acute blood loss anemia 07/05/2016  . Multiple fractures of ribs of right side 07/02/2016  . Musculoskeletal arm pain 01/03/2014  . Hoarseness, persistent 01/03/2014  . Pacemaker 10/24/2013  . Atrial fibrillation ([place]) 10/24/2013  . Syncope 07/25/2013  . Stroke ([place]) 07/16/2013  . Acute right MCA stroke ([place]) 07/16/2013  . Acute left hemiparesis ([place]) 07/16/2013  . Accelerated hypertension 07/16/2013  . Dyslipidemia, goal LDL below 100 07/16/2013  . Dysarthria as late effect of stroke 07/16/2013  . Unspecified hypothyroidism 07/16/2013  . Encounter for long-term (current) use of medications 07/16/2013    Orientation RESPIRATION BLADDER Height & Weight     Self, Time, Situation, Place  Normal Continent Weight: 143 lb (64.9 kg) Height:  5\' 5"  (165.1 cm)  BEHAVIORAL SYMPTOMS/MOOD NEUROLOGICAL BOWEL NUTRITION STATUS      Continent    AMBULATORY STATUS COMMUNICATION OF NEEDS Skin   Limited Assist Verbally Surgical wounds                      Personal Care Assistance Level of Assistance  Bathing, Dressing Bathing Assistance: Limited assistance   Dressing Assistance: Limited assistance     Functional Limitations Info             SPECIAL CARE FACTORS FREQUENCY  PT (By licensed PT), OT (By licensed OT)     PT Frequency: daily OT Frequency: daily            Contractures Contractures Info: Not present    Additional Factors Info                  Current Medications (07/07/2016):  This is the current hospital active medication list Current Facility-Administered Medications  Medication Dose Route Frequency Provider Last Rate Last Dose  . acetaminophen (TYLENOL) tablet 1,000 mg  1,000 mg Oral Q6H PRN Donnie Mesa, MD   1,000 mg at 07/04/16 0807  . apixaban (ELIQUIS) tablet 5 mg  5 mg Oral BID Kris Mouton, RPH   5 mg at 07/07/16 [id number]  . docusate sodium (COLACE) capsule 100 mg  100 mg Oral BID Lisette Abu, PA-C   100 mg at 07/07/16 [id number]  . flecainide (TAMBOCOR) tablet 75 mg  75 mg Oral BID Donnie Mesa, MD   75 mg at 07/07/16 0813  . levothyroxine (SYNTHROID, LEVOTHROID) tablet 88 mcg  88 mcg Oral QAC breakfast Donnie Mesa, MD   88 mcg at 07/07/16 0813  . lisinopril (PRINIVIL,ZESTRIL) tablet 10 mg  10 mg Oral BID Donnie Mesa,  MD   10 mg at 07/07/16 0814  . methocarbamol (ROBAXIN) tablet 500 mg  500 mg Oral Q6H PRN Judeth Horn, MD   500 mg at 07/07/16 0855  . metoprolol succinate (TOPROL-XL) 24 hr tablet 25 mg  25 mg Oral Q8H Donnie Mesa, MD   25 mg at 07/07/16 0657  . morphine 2 MG/ML injection 2 mg  2 mg Intravenous Q4H PRN Lisette Abu, PA-C      . ondansetron [facility]) tablet 4 mg  4 mg Oral Q6H PRN Donnie Mesa, MD       Or  . ondansetron [facility]) injection 4 mg  4 mg Intravenous Q6H PRN Donnie Mesa, MD      . polyethylene glycol (MIRALAX / GLYCOLAX) packet 17 g  17 g Oral Daily Lisette Abu, PA-C   17 g at 07/07/16 [id number]  . traMADol (ULTRAM) tablet 50-100 mg  50-100 mg  Oral Q6H PRN Lisette Abu, PA-C   50 mg at 07/06/16 1027     Discharge Medications: Please see discharge summary for a list of discharge medications.  Relevant Imaging Results:  Relevant Lab Results:   Additional Information SSN: [id number]  Dulcy Fanny, LCSW

## 2016-07-07 NOTE — Progress Notes (Signed)
Physical Therapy Treatment Patient Details Name: Jessica Myers MRN: [id number] DOB: 06/04/1929 Today's Date: 07/07/2016    History of Present Illness pt presents after a fall down stairs sustaining R 2-3 rib fxs, R PTX, and L thumb fx.  pt with hx of A-fib, Anxiety, Vertigo, HTN, CVA, and Pacemaker.    PT Comments    Performed stair training with pt today and after assessing stairs and her bed mobility, feel that short term SNF is best option for pt at this time.  Her husband would not be able to safely assist her with her current mobility needs.  Pt also appears quite anxious about stairs and would benefit from continued training before returning home.  At this time, pt and husband are agreeable to SNF.  Follow Up Recommendations  SNF     Equipment Recommendations  None recommended by PT    Recommendations for Other Services       Precautions / Restrictions Precautions Precautions: Fall Precaution Comments:  pt with hx of multiple falls Restrictions Weight Bearing Restrictions: No    Mobility  Bed Mobility Overal bed mobility: Needs Assistance       Supine to sit: Min assist     General bed mobility comments: MIN A to get trunk upright due to pain  Transfers Overall transfer level: Needs assistance Equipment used: Rolling walker (2 wheeled) Transfers: Sit to/from Stand Sit to Stand: Min guard         General transfer comment: cues for hand placement with every transfer  Ambulation/Gait Ambulation/Gait assistance: Min guard Ambulation Distance (Feet): 100 Feet Assistive device: Rolling walker (2 wheeled) Gait Pattern/deviations: Step-through pattern     General Gait Details: c/o B knee fatigue and arthritis   Stairs Stairs: Yes Stairs assistance: Min assist Stair Management: One rail Left;Sideways;Step to pattern Number of Stairs: 5 (x2) General stair comments: Stair training on practice stairs with MIN A and pt reporting feeling like her legs are  going to give way.  MIN A with sideways pattern so B UE supported and cues needed for proper foot placement to not get legs caught up on one another  Wheelchair Mobility    Modified Rankin (Stroke Patients Only)       Balance Overall balance assessment: History of Falls   Sitting balance-Leahy Scale: Good       Standing balance-Leahy Scale: Poor                      Cognition Arousal/Alertness: Awake/alert Behavior During Therapy: WFL for tasks assessed/performed Overall Cognitive Status: Within Functional Limits for tasks assessed                      Exercises      General Comments General comments (skin integrity, edema, etc.): bruising on face and hips      Pertinent Vitals/Pain Pain Assessment: Faces Faces Pain Scale: Hurts little more Pain Location: rib fx with bed mobility Pain Descriptors / Indicators: Grimacing Pain Intervention(s): Repositioned;Limited activity within patient's tolerance;Monitored during session    Home Living                      Prior Function            PT Goals (current goals can now be found in the care plan section) Acute Rehab PT Goals PT Goal Formulation: With patient/family Time For Goal Achievement: 07/12/16 Potential to Achieve Goals: Good Progress towards PT goals: Progressing  toward goals    Frequency    Min 3X/week      PT Plan Discharge plan needs to be updated    Co-evaluation             End of Session Equipment Utilized During Treatment: Gait belt Activity Tolerance: Patient limited by fatigue Patient left: with call bell/phone within reach;in chair;with family/visitor present     Time: [id number] PT Time Calculation (min) (ACUTE ONLY): 46 min  Charges:  $Gait Training: 23-37 mins $Therapeutic Activity: 8-22 mins                    G Codes:      Haru Shaff LUBECK 07/07/2016, 11:40 AM

## 2016-07-07 NOTE — Clinical Social Work Note (Signed)
Clinical Social Work Assessment  Patient Details  Name: Jessica Myers MRN: [id number] Date of Birth: 1929/08/04  Date of referral:  07/07/16               Reason for consult:  Facility Placement                Permission sought to share information with:  Facility Sport and exercise psychologist, Case Optician, dispensing granted to share information::  Yes, Verbal Permission Granted  Name::        Agency::   ([facility])  Relationship::     Contact Information:     Housing/Transportation Living arrangements for the past 2 months:  [place] of Information:  Patient Patient Interpreter Needed:  None Criminal Activity/Legal Involvement Pertinent to Current Situation/Hospitalization:  No - Comment as needed Significant Relationships:  Spouse Lives with:  Spouse Do you feel safe going back to the place where you live?  No Need for family participation in patient care:  No (Coment)  Care giving concerns:  Husband wishes for patient to receive STR close to home making visitation easier for family.   Social Worker assessment / plan:  CSW spoke patient regarding disposition. Patient states she is leary in regards to returning home from the hospital as she feels that she cannot successfully handle her ADLs.  Patient is requesting SNF at this time.  Payer source is [employer] and will require authorization.  Patient aware.  Patient states she is from [facility] and would like to receive STR at [facility].  Referral made and insurance authorization started.  Employment status:  Retired Forensic scientist:  Programmer, applications ([employer]) PT Recommendations:  [place] / Referral to community resources:  [place]  Patient/Family's Response to care:  Patient is agreeable to SNF/STR  Patient/Family's Understanding of and Emotional Response to Diagnosis, Current Treatment, and Prognosis:   Patient expresses concern regarding the transition process and hesitancy regarding going home.  Patient was provided education regarding the transition process and CSW offered support.  Patient expressed gratitude.   Emotional Assessment Appearance:  Appears older than stated age Attitude/Demeanor/Rapport:    Affect (typically observed):  Accepting, Adaptable Orientation:  Oriented to Self, Oriented to Place, Oriented to  Time, Oriented to Situation Alcohol / Substance use:  Not Applicable Psych involvement (Current and /or in the community):  No (Comment)  Discharge Needs  Concerns to be addressed:  No discharge needs identified Readmission within the last 30 days:  No Current discharge risk:  None Barriers to Discharge:  Ship broker, Continued Medical Work up   [employer], LCSW 07/07/2016, 12:40 PM

## 2016-07-11 ENCOUNTER — Encounter: Payer: Self-pay | Admitting: Internal Medicine

## 2016-07-11 ENCOUNTER — Non-Acute Institutional Stay (SKILLED_NURSING_FACILITY): Payer: PPO | Admitting: Internal Medicine

## 2016-07-11 DIAGNOSIS — I482 Chronic atrial fibrillation, unspecified: Secondary | ICD-10-CM

## 2016-07-11 DIAGNOSIS — D649 Anemia, unspecified: Secondary | ICD-10-CM | POA: Diagnosis not present

## 2016-07-11 DIAGNOSIS — I10 Essential (primary) hypertension: Secondary | ICD-10-CM

## 2016-07-11 DIAGNOSIS — W108XXS Fall (on) (from) other stairs and steps, sequela: Secondary | ICD-10-CM | POA: Diagnosis not present

## 2016-07-11 NOTE — [place] Notes (Signed)
Provider:  Veleta Miners Location:   [place] Room Number: 152/D Place of Service:  SNF (31)  PCP: Monico Blitz, MD Patient Care Team: Monico Blitz, MD as PCP - General (Internal Medicine) Evans Lance, MD as Consulting Physician (Cardiology)  Extended Emergency Contact Information Primary Emergency Contact: Buttrey,Leroy Address: [address]          [place], [place] [address] [place] Phone: [phone] Mobile Phone: [phone] Relation: Spouse Secondary Emergency Contact: Christell Faith [place] Phone: [phone] Mobile Phone: [phone] Relation: Son  Code Status: Full Code Goals of Care: Advanced Directive information Advanced Directives 07/11/2016  Does patient have an advance directive? Yes  Type of Advance Directive (No Data)  Copy of advanced directive(s) in chart? Yes  Would patient like information on creating an advanced directive? -  Pre-existing out of facility DNR order (yellow form or pink MOST form) -      Chief Complaint  Patient presents with  . New Admit To SNF    HPI: Patient is a 82 y.o. female seen today for admission to SNF for therapy. Patient lives with her husband and it seems like was doing something upstairs in her house when she lost her balance and fell down 13 stairs. She tried to grab the railing but was unable to do it. Her husband found her at bottom of the stairs.She did not loose consciousness. She had no Chest pain or Dyspnea. She did say she doesn't t remember much after just felling down. No dizziness. She did sustain Right 2-3 ribs fracture, Left First distal phalanx Fracture and Pulmonary contusion also Left forehead contusion.  Patient is feeling well now. Walking with Gilford Rile. She says she wants to go home with her husband. Though her Bathrooms and bedrooms are on first floor. Past Medical History:  Diagnosis Date  . A-fib ([place])   . Anxiety   . Bradycardia      s/p PPM October 2014  . Chronic anticoagulation   . Headache(784.0)   . High cholesterol   . Hypertension   . Multiple rib fractures 07/01/2016   fell down stairs   . Pulmonary contusion 06/2016   from a fall   . Stroke [facility]) September 2014   thrombolytic therapy  . Thyroid disease   . Vertical vertigo    Past Surgical History:  Procedure Laterality Date  . CHOLECYSTECTOMY    . PACEMAKER INSERTION    . PERMANENT PACEMAKER INSERTION N/A 07/25/2013   Procedure: PERMANENT PACEMAKER INSERTION;  Surgeon: Deboraha Sprang, MD;  Location: [facility] CATH LAB;  Service: Cardiovascular;  Laterality: N/A;  . TEE WITHOUT CARDIOVERSION N/A 07/16/2013   Procedure: TRANSESOPHAGEAL ECHOCARDIOGRAM (TEE);  Surgeon: Thayer Headings, MD;  Location: [facility];  Service: Cardiovascular;  Laterality: N/A;    reports that she has never smoked. She has never used smokeless tobacco. She reports that she does not drink alcohol or use drugs. Social History   Social History  . Marital status: Married    Spouse name: leroy  . Number of children: 2  . Years of education: 12   Occupational History  . retired    Social History Main Topics  . Smoking status: Never Smoker  . Smokeless tobacco: Never Used  . Alcohol use No  . Drug use: No  . Sexual activity: No   Other Topics Concern  . Not on file   Social History Narrative   Patient lives at home with  her husband.    Functional Status Survey:    Family History  Problem Relation Age of Onset  . Cancer Mother   . Cancer Father     Health Maintenance  Topic Date Due  . TETANUS/TDAP  04/02/1950  . ZOSTAVAX  04/03/1991  . DEXA SCAN  04/02/1996  . PNA vac Low Risk Adult (1 of 2 - PCV13) 04/02/1996  . INFLUENZA VACCINE  Completed    Allergies  Allergen Reactions  . Biaxin [Clarithromycin] Shortness Of Breath and Rash    Unknown  . Penicillins Hives    Has patient had a PCN reaction causing immediate rash, facial/tongue/throat swelling, SOB  or lightheadedness with hypotension: Yes Has patient had a PCN reaction causing severe rash involving mucus membranes or skin necrosis: No Has patient had a PCN reaction that required hospitalization No Has patient had a PCN reaction occurring within the last 10 years: No If all of the above answers are "NO", then may proceed with Cephalosporin use.    . Shellfish Allergy Nausea And Vomiting  . Sulfa Antibiotics Other (See Comments)    "bad reaction," per patient  . Celebrex [Celecoxib] Rash    Current Outpatient Prescriptions on File Prior to Visit  Medication Sig Dispense Refill  . acetaminophen (TYLENOL) 500 MG tablet Take 500-1,000 mg by mouth every 6 (six) hours as needed for mild pain.    Marland Kitchen ALPRAZolam (XANAX) 0.25 MG tablet Take 0.5 tablets (0.125 mg total) by mouth daily as needed for sleep or anxiety. 30 tablet 5  . apixaban (ELIQUIS) 5 MG TABS tablet Take 1 tablet (5 mg total) by mouth 2 (two) times daily. 60 tablet 11  . atorvastatin (LIPITOR) 10 MG tablet Take 10 mg by mouth daily.    . cholecalciferol (VITAMIN D) 1000 UNITS tablet Take 1,000 Units by mouth daily.     . flecainide (TAMBOCOR) 150 MG tablet Take 0.5 tablets (75 mg total) by mouth 2 (two) times daily. 30 tablet 11  . levothyroxine (SYNTHROID, LEVOTHROID) 88 MCG tablet Take 88 mcg by mouth daily before breakfast.    . lisinopril (PRINIVIL,ZESTRIL) 10 MG tablet Take 10 mg by mouth 2 (two) times daily.    Marland Kitchen loratadine (CLARITIN) 10 MG tablet TAKE 1 TABLET (10 MG TOTAL) BY MOUTH DAILY. 30 tablet 2  . meclizine (ANTIVERT) 25 MG tablet Take 25 mg by mouth 3 (three) times daily as needed for dizziness.    . methocarbamol (ROBAXIN) 500 MG tablet Take 1 tablet (500 mg total) by mouth every 6 (six) hours as needed for muscle spasms.    . metoprolol succinate (TOPROL-XL) 25 MG 24 hr tablet TAKE 1 TABLET BY MOUTH 3 TIMES DAILY. 270 tablet 3  . omeprazole (PRILOSEC) 20 MG capsule Take 20 mg by mouth at bedtime.     . traMADol  (ULTRAM) 50 MG tablet Take 1 tablet (50 mg total) by mouth every 6 (six) hours as needed (Pain). 120 tablet 5   No current facility-administered medications on file prior to visit.      Review of Systems  Constitutional: Negative.   HENT: Negative.   Respiratory: Negative.   Cardiovascular: Negative.   Gastrointestinal: Negative.     Vitals:   07/11/16 1102  BP: 126/82  Pulse: 84  Resp: 16  Temp: 98.6 F (37 C)  TempSrc: Oral   There is no height or weight on file to calculate BMI. Physical Exam  Constitutional: She is oriented to person, place, and time. She appears well-developed  and well-nourished.  HENT:  Head: Normocephalic.  Eyes: Pupils are equal, round, and reactive to light.  Neck: Normal range of motion.  Cardiovascular: Normal rate and normal heart sounds.  An irregular rhythm present.  Pulmonary/Chest: Effort normal and breath sounds normal. No respiratory distress. She has no wheezes. She has no rales.  Abdominal: Soft. Bowel sounds are normal. She exhibits no distension. There is no tenderness. There is no rebound.  Neurological: She is alert and oriented to person, place, and time.  Good Motor strength in all extremities.  Skin:  Has multiple Bruises in forehead, buttocks and right side.of chest.     Labs reviewed: Basic Metabolic Panel:  Recent Labs  07/02/16 1438 07/03/16 0350  NA 138 136  K 4.5 4.2  CL 105 100*  CO2 29 27  GLUCOSE 127* 133*  BUN 24* 21*  CREATININE 1.35* 1.24*  CALCIUM 9.3 9.4   Liver Function Tests:  Recent Labs  07/03/16 0350  AST 33  ALT 18  ALKPHOS 53  BILITOT 1.1  PROT 6.2*  ALBUMIN 3.3*   No results for input(s): LIPASE, AMYLASE in the last 8760 hours. No results for input(s): AMMONIA in the last 8760 hours. CBC:  Recent Labs  07/02/16 1438 07/03/16 0350 07/05/16 0257 07/06/16 0533  WBC 10.9* 8.4 7.4 7.1  NEUTROABS 9.1*  --  5.2  --   HGB 11.8* 10.4* 8.8* 9.1*  HCT 37.4 33.9* 27.6* 28.5*  MCV  96.1 96.6 94.5 95.6  PLT 169 148* 135* 158   Cardiac Enzymes: No results for input(s): CKTOTAL, CKMB, CKMBINDEX, TROPONINI in the last 8760 hours. BNP: Invalid input(s): POCBNP Lab Results  Component Value Date   HGBA1C 5.2 07/12/2013   Lab Results  Component Value Date   TSH 1.173 07/25/2013   No results found for: VITAMINB12 No results found for: FOLATE No results found for: IRON, TIBC, FERRITIN  Imaging and Procedures obtained prior to SNF admission: No results found.  Assessment/Plan  Fall down stairs Patient is doing well with therapy. She is walking with the walker. She is going to continue therapy. Fall precautions Need to be careful on discharge due to her multilevel house.  Chronic atrial fibrillation   Also had pace maker. Stable on Eliquis.  Essential hypertension BP stable in Facility  Anemia  Patient had been HGB of 8.8 in hospital but got better subsquently. Repeat HGB in few days.      Family/ staff Communication:   Labs/tests ordered:

## 2016-07-15 ENCOUNTER — Encounter: Payer: Self-pay | Admitting: Internal Medicine

## 2016-07-15 ENCOUNTER — Encounter (HOSPITAL_COMMUNITY)
Admission: RE | Admit: 2016-07-15 | Discharge: 2016-07-15 | Disposition: A | Discharge status: Home / Self Care | Payer: PPO | Source: Skilled Nursing Facility | Attending: Internal Medicine | Admitting: Internal Medicine

## 2016-07-15 ENCOUNTER — Non-Acute Institutional Stay (SKILLED_NURSING_FACILITY): Payer: PPO | Admitting: Internal Medicine

## 2016-07-15 DIAGNOSIS — I10 Essential (primary) hypertension: Secondary | ICD-10-CM

## 2016-07-15 DIAGNOSIS — S62639A Displaced fracture of distal phalanx of unspecified finger, initial encounter for closed fracture: Secondary | ICD-10-CM

## 2016-07-15 DIAGNOSIS — D62 Acute posthemorrhagic anemia: Secondary | ICD-10-CM

## 2016-07-15 DIAGNOSIS — S2241XD Multiple fractures of ribs, right side, subsequent encounter for fracture with routine healing: Secondary | ICD-10-CM | POA: Diagnosis not present

## 2016-07-15 DIAGNOSIS — I482 Chronic atrial fibrillation, unspecified: Secondary | ICD-10-CM

## 2016-07-15 LAB — CBC
HCT: 34.5 % — ABNORMAL LOW (ref 36.0–46.0)
Hemoglobin: 10.9 g/dL — ABNORMAL LOW (ref 12.0–15.0)
MCH: 31.3 pg (ref 26.0–34.0)
MCHC: 31.6 g/dL (ref 30.0–36.0)
MCV: 99.1 fL (ref 78.0–100.0)
PLATELETS: 354 10*3/uL (ref 150–400)
RBC: 3.48 MIL/uL — AB (ref 3.87–5.11)
RDW: 14.5 % (ref 11.5–15.5)
WBC: 7.2 10*3/uL (ref 4.0–10.5)

## 2016-07-15 LAB — BASIC METABOLIC PANEL
Anion gap: 5 (ref 5–15)
BUN: 21 mg/dL — AB (ref 6–20)
CHLORIDE: 102 mmol/L (ref 101–111)
CO2: 29 mmol/L (ref 22–32)
CREATININE: 1.26 mg/dL — AB (ref 0.44–1.00)
Calcium: 9.3 mg/dL (ref 8.9–10.3)
GFR calc Af Amer: 44 mL/min — ABNORMAL LOW (ref >60)
GFR, EST NON AFRICAN AMERICAN: 38 mL/min — AB (ref >60)
GLUCOSE: 91 mg/dL (ref 65–99)
POTASSIUM: 4 mmol/L (ref 3.5–5.1)
SODIUM: 136 mmol/L (ref 135–145)

## 2016-07-15 NOTE — Progress Notes (Signed)
Location:   [place] Room Number: 152/D Place of Service:  SNF (31)  Provider: Granville Lewis  PCP: Monico Blitz, MD Patient Care Team: Monico Blitz, MD as PCP - General (Internal Medicine) Evans Lance, MD as Consulting Physician (Cardiology)  Extended Emergency Contact Information Primary Emergency Contact: Kruse,Leroy Address: [address]          [place],  [address] [place] Phone: [phone] Mobile Phone: [phone] Relation: Spouse Secondary Emergency Contact: Christell Faith [place] Phone: [phone] Mobile Phone: [phone] Relation: Son  Code Status: Full Code Goals of care:  Advanced Directive information Advanced Directives 07/15/2016  Does patient have an advance directive? Yes  Type of Advance Directive (No Data)  Does patient want to make changes to advanced directive? No - Patient declined  Copy of advanced directive(s) in chart? Yes  Would patient like information on creating an advanced directive? -  Pre-existing out of facility DNR order (yellow form or pink MOST form) -     Allergies  Allergen Reactions  . Biaxin [Clarithromycin] Shortness Of Breath and Rash    Unknown  . Penicillins Hives    Has patient had a PCN reaction causing immediate rash, facial/tongue/throat swelling, SOB or lightheadedness with hypotension: Yes Has patient had a PCN reaction causing severe rash involving mucus membranes or skin necrosis: No Has patient had a PCN reaction that required hospitalization No Has patient had a PCN reaction occurring within the last 10 years: No If all of the above answers are "NO", then may proceed with Cephalosporin use.    . Shellfish Allergy Nausea And Vomiting  . Sulfa Antibiotics Other (See Comments)    "bad reaction," per patient  . Celebrex [Celecoxib] Rash    Chief Complaint  Patient presents with  . Discharge Note    HPI:  82 y.o. female  seen  today for discharge-she is been here for rehabilitation after sustaining a fall at home.  Apparently she lost her balance and fell down 13 stairs.  She sustained right second and third rib fractures a left first distal phalanx fracture and pulmonary contusion.  As well as a forehead contusion.  She appears to have made a good recovery from that she is now ambulating pretty well with her walker has done well with therapy hematoma appears to be slowly resolving her pain appears to be under control-.  She does live with her husband and has a very supportive daughter who is actually with certain day.  She also has a history of chronic atrial fibrillation which appears to be rate controlled on Lopressor-she is on  Eliquist for anticoagulation-cardiology apparently will assess the need to continue this with her history of falls.  She also had anemia with a hemoglobin of 8.8 the hospital but this has improved most recently up to 9.1-this will need to be rechecked.  Clinically she appears stable she is stronger does not complain of any syncope shortness of breath or increased weakness.        Past Medical History:  Diagnosis Date  . A-fib ([place])   . Anxiety   . Bradycardia    s/p PPM October 2014  . Chronic anticoagulation   . Headache(784.0)   . High cholesterol   . Hypertension   . Multiple rib fractures 07/01/2016   fell down stairs   . Pulmonary contusion 06/2016   from a fall   . Stroke [facility]) September 2014   thrombolytic therapy  .  Thyroid disease   . Vertical vertigo     Past Surgical History:  Procedure Laterality Date  . CHOLECYSTECTOMY    . PACEMAKER INSERTION    . PERMANENT PACEMAKER INSERTION N/A 07/25/2013   Procedure: PERMANENT PACEMAKER INSERTION;  Surgeon: Deboraha Sprang, MD;  Location: [facility] CATH LAB;  Service: Cardiovascular;  Laterality: N/A;  . TEE WITHOUT CARDIOVERSION N/A 07/16/2013   Procedure: TRANSESOPHAGEAL ECHOCARDIOGRAM (TEE);  Surgeon: Thayer Headings, MD;  Location: [facility];  Service: Cardiovascular;  Laterality: N/A;      reports that she has never smoked. She has never used smokeless tobacco. She reports that she does not drink alcohol or use drugs. Social History   Social History  . Marital status: Married    Spouse name: leroy  . Number of children: 2  . Years of education: 12   Occupational History  . retired    Social History Main Topics  . Smoking status: Never Smoker  . Smokeless tobacco: Never Used  . Alcohol use No  . Drug use: No  . Sexual activity: No   Other Topics Concern  . Not on file   Social History Narrative   Patient lives at home with her husband.   Functional Status Survey:    Allergies  Allergen Reactions  . Biaxin [Clarithromycin] Shortness Of Breath and Rash    Unknown  . Penicillins Hives    Has patient had a PCN reaction causing immediate rash, facial/tongue/throat swelling, SOB or lightheadedness with hypotension: Yes Has patient had a PCN reaction causing severe rash involving mucus membranes or skin necrosis: No Has patient had a PCN reaction that required hospitalization No Has patient had a PCN reaction occurring within the last 10 years: No If all of the above answers are "NO", then may proceed with Cephalosporin use.    . Shellfish Allergy Nausea And Vomiting  . Sulfa Antibiotics Other (See Comments)    "bad reaction," per patient  . Celebrex [Celecoxib] Rash    Pertinent  Health Maintenance Due  Topic Date Due  . DEXA SCAN  04/02/1996  . PNA vac Low Risk Adult (1 of 2 - PCV13) 04/02/1996  . INFLUENZA VACCINE  Completed    Medications: Current Outpatient Prescriptions on File Prior to Visit  Medication Sig Dispense Refill  . acetaminophen (TYLENOL) 500 MG tablet Take 500-1,000 mg by mouth every 6 (six) hours as needed for mild pain.    Marland Kitchen ALPRAZolam (XANAX) 0.25 MG tablet Take 0.5 tablets (0.125 mg total) by mouth daily as needed for sleep or anxiety. 30  tablet 5  . apixaban (ELIQUIS) 5 MG TABS tablet Take 1 tablet (5 mg total) by mouth 2 (two) times daily. 60 tablet 11  . atorvastatin (LIPITOR) 10 MG tablet Take 10 mg by mouth daily.    . cholecalciferol (VITAMIN D) 1000 UNITS tablet Take 1,000 Units by mouth daily.     . flecainide (TAMBOCOR) 150 MG tablet Take 0.5 tablets (75 mg total) by mouth 2 (two) times daily. 30 tablet 11  . levothyroxine (SYNTHROID, LEVOTHROID) 88 MCG tablet Take 88 mcg by mouth daily before breakfast.    . lisinopril (PRINIVIL,ZESTRIL) 10 MG tablet Take 10 mg by mouth 2 (two) times daily.    Marland Kitchen loratadine (CLARITIN) 10 MG tablet TAKE 1 TABLET (10 MG TOTAL) BY MOUTH DAILY. 30 tablet 2  . meclizine (ANTIVERT) 25 MG tablet Take 25 mg by mouth 3 (three) times daily as needed for dizziness.    Marland Kitchen  methocarbamol (ROBAXIN) 500 MG tablet Take 1 tablet (500 mg total) by mouth every 6 (six) hours as needed for muscle spasms.    . metoprolol succinate (TOPROL-XL) 25 MG 24 hr tablet TAKE 1 TABLET BY MOUTH 3 TIMES DAILY. 270 tablet 3  . omeprazole (PRILOSEC) 20 MG capsule Take 20 mg by mouth at bedtime.     . polyethylene glycol (MIRALAX / GLYCOLAX) packet Take 17 g by mouth daily.    . traMADol (ULTRAM) 50 MG tablet Take 1 tablet (50 mg total) by mouth every 6 (six) hours as needed (Pain). 120 tablet 5   No current facility-administered medications on file prior to visit.     Review of Systems   In general does not complain of fever or chills says she feels stronger.  Skin does not complain of rashes or itching hematoma side of face as well as thorax area and back of neck appears to be slowly resolving.  Head ears eyes nose mouth and throat does not complain of visual changes or sore throat.  Respiratory is not complaining shortness breath or cough.  Cardiac no chest pain or significant lower extremity edema.  GI is not complaining of nausea vomiting diarrhea constipation or abdominal pain.  Musculoskeletal at this point  her pain appears to be significantly improved she does have tramadol as needed for pain.  Neurologic cranial dizziness headache or syncopal-type feelings.  Psych is not complaining of anxiety or depression does have when necessary low-dose Xanax as needed.      Temperature 97.9 pulse 66 respirations 18 blood pressure 129/81.  Physical Exam   In general this is a pleasant elderly female in no distress she is ambulating well with her walker.  Her skin is warm and dry there appears to be a brawny appearing resolving bruising left side of her face-she also has violaceous bruising of her thorax area shoulders bilaterally and for back and neck-.  Eyes pupils appear reactive to light sclerae are clear visual acuity appears grossly intact.  Oropharynx clear mucous membranes moist.  Chest is clear to auscultation there is no labored breathing.  Heart is regular rate and rhythm without murmur gallop or rub she does not have significant lower extremity edema.  Her abdomen soft nontender positive bowel sounds.  Musculoskeletal does move all extremities 4 ambulates fairly well with a rolling walker-she does have splinting and bandaging applied to her left thumb-lateral right hand area-radial pulses intact E refill intact.  Neurologic is grossly intact her speech is clear no lateralizing findings.  Psych she is alert and oriented pleasant and appropriate  Labs reviewed: Basic Metabolic Panel:  Recent Labs  07/02/16 1438 07/03/16 0350  NA 138 136  K 4.5 4.2  CL 105 100*  CO2 29 27  GLUCOSE 127* 133*  BUN 24* 21*  CREATININE 1.35* 1.24*  CALCIUM 9.3 9.4   Liver Function Tests:  Recent Labs  07/03/16 0350  AST 33  ALT 18  ALKPHOS 53  BILITOT 1.1  PROT 6.2*  ALBUMIN 3.3*   No results for input(s): LIPASE, AMYLASE in the last 8760 hours. No results for input(s): AMMONIA in the last 8760 hours. CBC:  Recent Labs  07/02/16 1438 07/03/16 0350 07/05/16 0257  07/06/16 0533  WBC 10.9* 8.4 7.4 7.1  NEUTROABS 9.1*  --  5.2  --   HGB 11.8* 10.4* 8.8* 9.1*  HCT 37.4 33.9* 27.6* 28.5*  MCV 96.1 96.6 94.5 95.6  PLT 169 148* 135* 158   Cardiac  Enzymes: No results for input(s): CKTOTAL, CKMB, CKMBINDEX, TROPONINI in the last 8760 hours. BNP: Invalid input(s): POCBNP CBG:  Recent Labs  07/02/16 1350  GLUCAP 102*    Procedures and Imaging Studies During Stay: Dg Chest 2 View  Result Date: 07/03/2016 CLINICAL DATA:  Pneumothorax.  Fall down stairs EXAM: CHEST  2 VIEW COMPARISON:  CT 07/02/2016 FINDINGS: Normal cardiac silhouette. LEFT-sided pacer. No pulmonary contusion or pleural fluid. No pneumothorax appreciated. On lateral projection, compression fracture at at T4 with approximately 50% loss vertebral body height and T7 with approximately 30% loss of vertebral height. IMPRESSION: 1. No evidence pneumothorax. 2. Compression fractures at T4 and T7, age-indeterminate. Electronically Signed   By: Suzy Bouchard M.D.   On: 07/03/2016 13:41   Dg Chest 2 View  Result Date: 07/02/2016 CLINICAL DATA:  Fall several hours ago down her basement stairs; Large bruise to forehead; Painful right shoulder; Pain in mid back EXAM: CHEST  2 VIEW COMPARISON:  None. FINDINGS: Cardiac silhouette is top-normal in size. No mediastinal or hilar masses or evidence of adenopathy. Left anterior chest wall sequential pacemaker is well positioned. Clear lungs.  No pleural effusion or pneumothorax. Bony thorax is demineralized. There is a mild compression deformity of a mid thoracic vertebra. IMPRESSION: 1. No acute cardiopulmonary disease. 2. Mild compression fracture of a mid thoracic vertebrae of unclear chronicity. Electronically Signed   By: Lajean Manes M.D.   On: 07/02/2016 15:25   Dg Thoracic Spine 2 View  Result Date: 07/02/2016 CLINICAL DATA:  Fall several hours ago down her basement stairs; Large bruise to forehead; Painful right shoulder; Pain in mid back EXAM:  THORACIC SPINE 2 VIEWS COMPARISON:  None. FINDINGS: Compression deformity in the mid thoracic spine (T7) with approximately 30% loss vertebral body height anteriorly is indeterminate age. No retropulsion. No additional evidence of fracture. IMPRESSION: Age-indeterminate mild compression deformity at T7. Electronically Signed   By: Suzy Bouchard M.D.   On: 07/02/2016 15:26   Dg Shoulder Right  Result Date: 07/02/2016 CLINICAL DATA:  Fall several hours ago down her basement stairs; Large bruise to forehead; Painful right shoulder; Pain in mid back EXAM: RIGHT SHOULDER - 2+ VIEW COMPARISON:  None. FINDINGS: No fracture or dislocation. Bones are demineralized. No significant arthropathic change. Soft tissues are unremarkable. IMPRESSION: No fracture or dislocation. Electronically Signed   By: Lajean Manes M.D.   On: 07/02/2016 15:24   Ct Head Wo Contrast  Result Date: 07/02/2016 CLINICAL DATA:  Patient status post fall down the stairs. Positive reported loss of consciousness. Soft tissue swelling and hematoma overlying the left frontal calvarium. EXAM: CT HEAD WITHOUT CONTRAST CT CERVICAL SPINE WITHOUT CONTRAST TECHNIQUE: Multidetector CT imaging of the head and cervical spine was performed following the standard protocol without intravenous contrast. Multiplanar CT image reconstructions of the cervical spine were also generated. COMPARISON:  Brain CT 04/12/2015. FINDINGS: CT HEAD FINDINGS Brain: Ventricles and sulci are appropriate for patient's age. Periventricular and subcortical white matter hypodensity compatible with chronic microvascular ischemic changes. Old right basal ganglia infarct. No evidence for acute cortically based infarct, intracranial hemorrhage, mass lesion or mass-effect. Vascular: Unremarkable. Skull: Intact. Sinuses/Orbits: Paranasal sinuses and mastoid air cells are well aerated. Orbits are unremarkable. Other: Soft tissue swelling overlying the left frontal calvarium. CT CERVICAL  SPINE FINDINGS Normal anatomic alignment. Multilevel degenerative disc disease most pronounced C4-5, C5-6 and C6-7. Craniocervical junction is intact. No acute cervical spine fracture. Prevertebral soft tissues unremarkable. Right greater than left apical pleural parenchymal  thickening. Nondisplaced left first rib fracture. This is best demonstrated on image 54; series 7. Nondisplaced rib fractures through the posterior medial right first and third ribs (image 45; series 6) and (image 62; series 6). Mildly displaced fracture through the posterior medial right second rib (image 53; series 6). Possible nondisplaced fracture through the medial left fourth rib (image 256; series 5). IMPRESSION: Bilateral rib fractures involving the posterior medial right first, second and third ribs. Nondisplaced fracture through the posterior left first rib. Possible nondisplaced fracture through the medial left fourth rib. No acute intracranial process. Old right basal ganglia lacunar infarct. No acute cervical spine fracture. Soft tissue swelling overlying the left frontal calvarium without underlying skull fracture. Electronically Signed   By: Lovey Newcomer M.D.   On: 07/02/2016 16:03   Ct Chest W Contrast  Result Date: 07/02/2016 CLINICAL DATA:  Fall down 13 stairs with rib pain and forehead hematoma. EXAM: CT CHEST, ABDOMEN, AND PELVIS WITH CONTRAST TECHNIQUE: Multidetector CT imaging of the chest, abdomen and pelvis was performed following the standard protocol during bolus administration of intravenous contrast. CONTRAST:  27mL ISOVUE-300 IOPAMIDOL (ISOVUE-300) INJECTION 61% COMPARISON:  None. FINDINGS: CT CHEST FINDINGS Cardiovascular: Pacemaker over the left chest wall. There is mild cardiomegaly. Mild calcified plaque over the left anterior descending coronary artery. Calcified plaque over the thoracic aorta. Remaining vascular structures are within normal. Mediastinum/Nodes: No evidence of mediastinal or hilar adenopathy.  Remaining mediastinal structures are within normal. Lungs/Pleura: Lungs are well inflated with mild bibasilar dependent atelectasis. Tiny sliver of a pneumothorax over the anterior right midlung tiny adjacent contusion. Possible tiny amount of air just anterior to the heart. Adjacent Airways are normal. Musculoskeletal: Subtle fractures of the posterior medial aspect at the costovertebral junction of the right second and third ribs. There is a moderate T4 compression fracture age indeterminate. There is also a mild compression deformity of T7 age indeterminate. There are degenerative changes of the spine. CT ABDOMEN PELVIS FINDINGS Hepatobiliary: Previous cholecystectomy. Liver and biliary tree are otherwise within normal. Pancreas: Within normal. Spleen: Within normal. Adrenals/Urinary Tract: Adrenal glands are normal. Atrophic left kidney with a couple simple cysts over the upper pole. Right kidney is normal in size without hydronephrosis. There is a 4 mm stone over the posterior mid to upper pole. There are multiple right renal cysts. Bladder is unremarkable. Visualized ureters are unremarkable. Stomach/Bowel: Stomach and small bowel are within normal. Appendix is normal. Colon is unremarkable. There is moderate fecal retention throughout the colon. Vascular/Lymphatic: There is calcified plaque over the abdominal aorta and iliac arteries. There is no evidence adenopathy. Reproductive: Within normal. Other: Minimal free pelvic fluid. No focal inflammatory change. No free peritoneal air. Musculoskeletal: Degenerative change of the spine. IMPRESSION: Tiny sliver of a right pneumothorax over the anterior mid lung with small associated pulmonary contusion. Fractures of the posterior medial costovertebral junction of the right second and third ribs. No acute findings in the abdomen/ pelvis. Mild cardiomegaly and minimal atherosclerotic coronary artery disease. Aortic atherosclerosis. Atrophic left kidney.  Bilateral  renal cysts. Small amount free fluid in the pelvis. Compression fractures of T4 and T7 age indeterminate but likely chronic. Critical Value/emergent results were called by telephone at the time of interpretation on 07/02/2016 at 6:42 pm to Dr. Malvin Johns, who verbally acknowledged these results. Electronically Signed   By: Marin Olp M.D.   On: 07/02/2016 18:41   Ct Cervical Spine Wo Contrast  Result Date: 07/02/2016 CLINICAL DATA:  Patient status post fall  down the stairs. Positive reported loss of consciousness. Soft tissue swelling and hematoma overlying the left frontal calvarium. EXAM: CT HEAD WITHOUT CONTRAST CT CERVICAL SPINE WITHOUT CONTRAST TECHNIQUE: Multidetector CT imaging of the head and cervical spine was performed following the standard protocol without intravenous contrast. Multiplanar CT image reconstructions of the cervical spine were also generated. COMPARISON:  Brain CT 04/12/2015. FINDINGS: CT HEAD FINDINGS Brain: Ventricles and sulci are appropriate for patient's age. Periventricular and subcortical white matter hypodensity compatible with chronic microvascular ischemic changes. Old right basal ganglia infarct. No evidence for acute cortically based infarct, intracranial hemorrhage, mass lesion or mass-effect. Vascular: Unremarkable. Skull: Intact. Sinuses/Orbits: Paranasal sinuses and mastoid air cells are well aerated. Orbits are unremarkable. Other: Soft tissue swelling overlying the left frontal calvarium. CT CERVICAL SPINE FINDINGS Normal anatomic alignment. Multilevel degenerative disc disease most pronounced C4-5, C5-6 and C6-7. Craniocervical junction is intact. No acute cervical spine fracture. Prevertebral soft tissues unremarkable. Right greater than left apical pleural parenchymal thickening. Nondisplaced left first rib fracture. This is best demonstrated on image 54; series 7. Nondisplaced rib fractures through the posterior medial right first and third ribs (image 45;  series 6) and (image 62; series 6). Mildly displaced fracture through the posterior medial right second rib (image 53; series 6). Possible nondisplaced fracture through the medial left fourth rib (image 256; series 5). IMPRESSION: Bilateral rib fractures involving the posterior medial right first, second and third ribs. Nondisplaced fracture through the posterior left first rib. Possible nondisplaced fracture through the medial left fourth rib. No acute intracranial process. Old right basal ganglia lacunar infarct. No acute cervical spine fracture. Soft tissue swelling overlying the left frontal calvarium without underlying skull fracture. Electronically Signed   By: Lovey Newcomer M.D.   On: 07/02/2016 16:03   Ct Abdomen Pelvis W Contrast  Result Date: 07/02/2016 CLINICAL DATA:  Fall down 13 stairs with rib pain and forehead hematoma. EXAM: CT CHEST, ABDOMEN, AND PELVIS WITH CONTRAST TECHNIQUE: Multidetector CT imaging of the chest, abdomen and pelvis was performed following the standard protocol during bolus administration of intravenous contrast. CONTRAST:  58mL ISOVUE-300 IOPAMIDOL (ISOVUE-300) INJECTION 61% COMPARISON:  None. FINDINGS: CT CHEST FINDINGS Cardiovascular: Pacemaker over the left chest wall. There is mild cardiomegaly. Mild calcified plaque over the left anterior descending coronary artery. Calcified plaque over the thoracic aorta. Remaining vascular structures are within normal. Mediastinum/Nodes: No evidence of mediastinal or hilar adenopathy. Remaining mediastinal structures are within normal. Lungs/Pleura: Lungs are well inflated with mild bibasilar dependent atelectasis. Tiny sliver of a pneumothorax over the anterior right midlung tiny adjacent contusion. Possible tiny amount of air just anterior to the heart. Adjacent Airways are normal. Musculoskeletal: Subtle fractures of the posterior medial aspect at the costovertebral junction of the right second and third ribs. There is a moderate T4  compression fracture age indeterminate. There is also a mild compression deformity of T7 age indeterminate. There are degenerative changes of the spine. CT ABDOMEN PELVIS FINDINGS Hepatobiliary: Previous cholecystectomy. Liver and biliary tree are otherwise within normal. Pancreas: Within normal. Spleen: Within normal. Adrenals/Urinary Tract: Adrenal glands are normal. Atrophic left kidney with a couple simple cysts over the upper pole. Right kidney is normal in size without hydronephrosis. There is a 4 mm stone over the posterior mid to upper pole. There are multiple right renal cysts. Bladder is unremarkable. Visualized ureters are unremarkable. Stomach/Bowel: Stomach and small bowel are within normal. Appendix is normal. Colon is unremarkable. There is moderate fecal retention throughout the colon.  Vascular/Lymphatic: There is calcified plaque over the abdominal aorta and iliac arteries. There is no evidence adenopathy. Reproductive: Within normal. Other: Minimal free pelvic fluid. No focal inflammatory change. No free peritoneal air. Musculoskeletal: Degenerative change of the spine. IMPRESSION: Tiny sliver of a right pneumothorax over the anterior mid lung with small associated pulmonary contusion. Fractures of the posterior medial costovertebral junction of the right second and third ribs. No acute findings in the abdomen/ pelvis. Mild cardiomegaly and minimal atherosclerotic coronary artery disease. Aortic atherosclerosis. Atrophic left kidney.  Bilateral renal cysts. Small amount free fluid in the pelvis. Compression fractures of T4 and T7 age indeterminate but likely chronic. Critical Value/emergent results were called by telephone at the time of interpretation on 07/02/2016 at 6:42 pm to Dr. Malvin Johns, who verbally acknowledged these results. Electronically Signed   By: Marin Olp M.D.   On: 07/02/2016 18:41   Dg Hand Complete Left  Result Date: 07/02/2016 CLINICAL DATA:  Left hand pain with  swelling and bruising posteriorly. Fall. EXAM: LEFT HAND - COMPLETE 3+ VIEW COMPARISON:  None. FINDINGS: Mild degenerative change over the radiocarpal joint and carpal bones. Minimal degenerative change of the interphalangeal joints. Mild diffuse osteopenia. There is a minimally displaced fracture of the first distal phalanx. IMPRESSION: Minimally displaced fracture of the first distal phalanx. Electronically Signed   By: Marin Olp M.D.   On: 07/02/2016 21:13    Assessment/Plan:   . She fall downstairs-she has done quite well with therapy pain appears to be under better control is walking quite well with a walker continued PT and OT at home- In regards to rib fractures and distal phalanx  fracture-follow up with orthopedics as needed.  #2 history of atrial fibrillation this appears rate controlled she has a pacemaker continues on Lopressor 25 mg 3 times a day she is on  Eliquist for anticoagulation pending cardiology review.  #3 history of hypertension this appears to be stable recent blood pressures 112/68-129/81 she is on Lopressor as well as lisinopril 10 mg a day.  #5 history of anemia this appears to be rebounding hemoglobin 9.1 on lab done September 20 will update this.  #6 history of renal insufficiency a creatinine of 1.24 on lab done on 07/03/2016 appears relatively baseline.  #7 history hypothyroidism she is on Synthroid 88 g daily--since she is about to go home will defer follow-up to primary care provider  Patient is being discharged with the following home health services:  PT and OT for strengthening with history of fall and weakness  Patient is being discharged with the following durable medical equipment:    Patient has been advised to f/u with their PCP in 1-2 weeks to bring them up to date on their rehab stay.  Social services at facility was responsible for arranging this appointment.  Pt was provided with a 30 day supply of prescriptions for medications and refills must  be obtained from their PCP.  For controlled substances, a more limited supply may be provided adequate until PCP appointment only.  [id number] note 30 minutes spent on this discharge summary greater than 50% of time spent coordinating plan of care for numerous diagnoses

## 2016-07-18 DIAGNOSIS — M50921 Unspecified cervical disc disorder at C4-C5 level: Secondary | ICD-10-CM | POA: Diagnosis not present

## 2016-07-18 DIAGNOSIS — S2241XD Multiple fractures of ribs, right side, subsequent encounter for fracture with routine healing: Secondary | ICD-10-CM | POA: Diagnosis not present

## 2016-07-18 DIAGNOSIS — Z7901 Long term (current) use of anticoagulants: Secondary | ICD-10-CM | POA: Diagnosis not present

## 2016-07-18 DIAGNOSIS — I10 Essential (primary) hypertension: Secondary | ICD-10-CM | POA: Diagnosis not present

## 2016-07-18 DIAGNOSIS — M50923 Unspecified cervical disc disorder at C6-C7 level: Secondary | ICD-10-CM | POA: Diagnosis not present

## 2016-07-18 DIAGNOSIS — M50922 Unspecified cervical disc disorder at C5-C6 level: Secondary | ICD-10-CM | POA: Diagnosis not present

## 2016-07-18 DIAGNOSIS — F419 Anxiety disorder, unspecified: Secondary | ICD-10-CM | POA: Diagnosis not present

## 2016-07-18 DIAGNOSIS — S62522D Displaced fracture of distal phalanx of left thumb, subsequent encounter for fracture with routine healing: Secondary | ICD-10-CM | POA: Diagnosis not present

## 2016-07-18 DIAGNOSIS — Z9181 History of falling: Secondary | ICD-10-CM | POA: Diagnosis not present

## 2016-07-18 DIAGNOSIS — I482 Chronic atrial fibrillation: Secondary | ICD-10-CM | POA: Diagnosis not present

## 2016-07-18 DIAGNOSIS — Z950 Presence of cardiac pacemaker: Secondary | ICD-10-CM | POA: Diagnosis not present

## 2016-07-20 DIAGNOSIS — S62522D Displaced fracture of distal phalanx of left thumb, subsequent encounter for fracture with routine healing: Secondary | ICD-10-CM | POA: Diagnosis not present

## 2016-07-20 DIAGNOSIS — Z950 Presence of cardiac pacemaker: Secondary | ICD-10-CM | POA: Diagnosis not present

## 2016-07-20 DIAGNOSIS — M50923 Unspecified cervical disc disorder at C6-C7 level: Secondary | ICD-10-CM | POA: Diagnosis not present

## 2016-07-20 DIAGNOSIS — Z7901 Long term (current) use of anticoagulants: Secondary | ICD-10-CM | POA: Diagnosis not present

## 2016-07-20 DIAGNOSIS — F419 Anxiety disorder, unspecified: Secondary | ICD-10-CM | POA: Diagnosis not present

## 2016-07-20 DIAGNOSIS — I10 Essential (primary) hypertension: Secondary | ICD-10-CM | POA: Diagnosis not present

## 2016-07-20 DIAGNOSIS — Z9181 History of falling: Secondary | ICD-10-CM | POA: Diagnosis not present

## 2016-07-20 DIAGNOSIS — M50922 Unspecified cervical disc disorder at C5-C6 level: Secondary | ICD-10-CM | POA: Diagnosis not present

## 2016-07-20 DIAGNOSIS — I482 Chronic atrial fibrillation: Secondary | ICD-10-CM | POA: Diagnosis not present

## 2016-07-20 DIAGNOSIS — M50921 Unspecified cervical disc disorder at C4-C5 level: Secondary | ICD-10-CM | POA: Diagnosis not present

## 2016-07-20 DIAGNOSIS — S2241XD Multiple fractures of ribs, right side, subsequent encounter for fracture with routine healing: Secondary | ICD-10-CM | POA: Diagnosis not present

## 2016-07-25 DIAGNOSIS — Z713 Dietary counseling and surveillance: Secondary | ICD-10-CM | POA: Diagnosis not present

## 2016-07-25 DIAGNOSIS — S2241XD Multiple fractures of ribs, right side, subsequent encounter for fracture with routine healing: Secondary | ICD-10-CM | POA: Diagnosis not present

## 2016-07-25 DIAGNOSIS — S6292XA Unspecified fracture of left wrist and hand, initial encounter for closed fracture: Secondary | ICD-10-CM | POA: Diagnosis not present

## 2016-07-25 DIAGNOSIS — Z6824 Body mass index (BMI) 24.0-24.9, adult: Secondary | ICD-10-CM | POA: Diagnosis not present

## 2016-07-26 DIAGNOSIS — S62525A Nondisplaced fracture of distal phalanx of left thumb, initial encounter for closed fracture: Secondary | ICD-10-CM | POA: Diagnosis not present

## 2016-07-27 DIAGNOSIS — I482 Chronic atrial fibrillation: Secondary | ICD-10-CM | POA: Diagnosis not present

## 2016-07-27 DIAGNOSIS — I10 Essential (primary) hypertension: Secondary | ICD-10-CM | POA: Diagnosis not present

## 2016-07-27 DIAGNOSIS — M50923 Unspecified cervical disc disorder at C6-C7 level: Secondary | ICD-10-CM | POA: Diagnosis not present

## 2016-07-27 DIAGNOSIS — Z7901 Long term (current) use of anticoagulants: Secondary | ICD-10-CM | POA: Diagnosis not present

## 2016-07-27 DIAGNOSIS — Z9181 History of falling: Secondary | ICD-10-CM | POA: Diagnosis not present

## 2016-07-27 DIAGNOSIS — M50922 Unspecified cervical disc disorder at C5-C6 level: Secondary | ICD-10-CM | POA: Diagnosis not present

## 2016-07-27 DIAGNOSIS — M50921 Unspecified cervical disc disorder at C4-C5 level: Secondary | ICD-10-CM | POA: Diagnosis not present

## 2016-07-27 DIAGNOSIS — S62522D Displaced fracture of distal phalanx of left thumb, subsequent encounter for fracture with routine healing: Secondary | ICD-10-CM | POA: Diagnosis not present

## 2016-07-27 DIAGNOSIS — F419 Anxiety disorder, unspecified: Secondary | ICD-10-CM | POA: Diagnosis not present

## 2016-07-27 DIAGNOSIS — S2241XD Multiple fractures of ribs, right side, subsequent encounter for fracture with routine healing: Secondary | ICD-10-CM | POA: Diagnosis not present

## 2016-07-27 DIAGNOSIS — Z950 Presence of cardiac pacemaker: Secondary | ICD-10-CM | POA: Diagnosis not present

## 2016-08-08 DIAGNOSIS — Z950 Presence of cardiac pacemaker: Secondary | ICD-10-CM | POA: Diagnosis not present

## 2016-08-08 DIAGNOSIS — I4891 Unspecified atrial fibrillation: Secondary | ICD-10-CM | POA: Diagnosis not present

## 2016-08-08 DIAGNOSIS — F419 Anxiety disorder, unspecified: Secondary | ICD-10-CM | POA: Diagnosis not present

## 2016-08-08 DIAGNOSIS — I10 Essential (primary) hypertension: Secondary | ICD-10-CM | POA: Diagnosis not present

## 2016-08-08 DIAGNOSIS — H6690 Otitis media, unspecified, unspecified ear: Secondary | ICD-10-CM | POA: Diagnosis not present

## 2016-08-08 DIAGNOSIS — S2241XD Multiple fractures of ribs, right side, subsequent encounter for fracture with routine healing: Secondary | ICD-10-CM | POA: Diagnosis not present

## 2016-08-08 DIAGNOSIS — I482 Chronic atrial fibrillation: Secondary | ICD-10-CM | POA: Diagnosis not present

## 2016-08-08 DIAGNOSIS — M50921 Unspecified cervical disc disorder at C4-C5 level: Secondary | ICD-10-CM | POA: Diagnosis not present

## 2016-08-08 DIAGNOSIS — M50923 Unspecified cervical disc disorder at C6-C7 level: Secondary | ICD-10-CM | POA: Diagnosis not present

## 2016-08-08 DIAGNOSIS — Z299 Encounter for prophylactic measures, unspecified: Secondary | ICD-10-CM | POA: Diagnosis not present

## 2016-08-08 DIAGNOSIS — M50922 Unspecified cervical disc disorder at C5-C6 level: Secondary | ICD-10-CM | POA: Diagnosis not present

## 2016-08-08 DIAGNOSIS — Z7901 Long term (current) use of anticoagulants: Secondary | ICD-10-CM | POA: Diagnosis not present

## 2016-08-08 DIAGNOSIS — S62522D Displaced fracture of distal phalanx of left thumb, subsequent encounter for fracture with routine healing: Secondary | ICD-10-CM | POA: Diagnosis not present

## 2016-08-08 DIAGNOSIS — E7800 Pure hypercholesterolemia, unspecified: Secondary | ICD-10-CM | POA: Diagnosis not present

## 2016-08-08 DIAGNOSIS — Z9181 History of falling: Secondary | ICD-10-CM | POA: Diagnosis not present

## 2016-08-11 DIAGNOSIS — I10 Essential (primary) hypertension: Secondary | ICD-10-CM | POA: Diagnosis not present

## 2016-08-11 DIAGNOSIS — Z950 Presence of cardiac pacemaker: Secondary | ICD-10-CM | POA: Diagnosis not present

## 2016-08-11 DIAGNOSIS — Z7901 Long term (current) use of anticoagulants: Secondary | ICD-10-CM | POA: Diagnosis not present

## 2016-08-11 DIAGNOSIS — S2241XD Multiple fractures of ribs, right side, subsequent encounter for fracture with routine healing: Secondary | ICD-10-CM | POA: Diagnosis not present

## 2016-08-11 DIAGNOSIS — M50922 Unspecified cervical disc disorder at C5-C6 level: Secondary | ICD-10-CM | POA: Diagnosis not present

## 2016-08-11 DIAGNOSIS — S62522D Displaced fracture of distal phalanx of left thumb, subsequent encounter for fracture with routine healing: Secondary | ICD-10-CM | POA: Diagnosis not present

## 2016-08-11 DIAGNOSIS — M50923 Unspecified cervical disc disorder at C6-C7 level: Secondary | ICD-10-CM | POA: Diagnosis not present

## 2016-08-11 DIAGNOSIS — Z9181 History of falling: Secondary | ICD-10-CM | POA: Diagnosis not present

## 2016-08-11 DIAGNOSIS — F419 Anxiety disorder, unspecified: Secondary | ICD-10-CM | POA: Diagnosis not present

## 2016-08-11 DIAGNOSIS — M50921 Unspecified cervical disc disorder at C4-C5 level: Secondary | ICD-10-CM | POA: Diagnosis not present

## 2016-08-11 DIAGNOSIS — I482 Chronic atrial fibrillation: Secondary | ICD-10-CM | POA: Diagnosis not present

## 2016-08-15 ENCOUNTER — Ambulatory Visit (INDEPENDENT_AMBULATORY_CARE_PROVIDER_SITE_OTHER): Payer: PPO | Admitting: *Deleted

## 2016-08-15 DIAGNOSIS — I495 Sick sinus syndrome: Secondary | ICD-10-CM

## 2016-08-16 NOTE — Progress Notes (Signed)
Remote pacemaker transmission.   

## 2016-08-19 ENCOUNTER — Encounter: Payer: Self-pay | Admitting: Cardiology

## 2016-08-24 DIAGNOSIS — I509 Heart failure, unspecified: Secondary | ICD-10-CM | POA: Diagnosis not present

## 2016-08-24 DIAGNOSIS — E7800 Pure hypercholesterolemia, unspecified: Secondary | ICD-10-CM | POA: Diagnosis not present

## 2016-08-24 DIAGNOSIS — I4891 Unspecified atrial fibrillation: Secondary | ICD-10-CM | POA: Diagnosis not present

## 2016-08-24 DIAGNOSIS — I10 Essential (primary) hypertension: Secondary | ICD-10-CM | POA: Diagnosis not present

## 2016-08-30 DIAGNOSIS — E2839 Other primary ovarian failure: Secondary | ICD-10-CM | POA: Diagnosis not present

## 2016-08-31 LAB — CUP PACEART REMOTE DEVICE CHECK
Battery Impedance: 158 Ohm
Brady Statistic AS VS Percent: 1 %
Date Time Interrogation Session: 20171030111403
Implantable Lead Implant Date: 20141009
Implantable Lead Location: 753859
Lead Channel Impedance Value: 911 Ohm
Lead Channel Pacing Threshold Amplitude: 0.75 V
Lead Channel Pacing Threshold Pulse Width: 0.4 ms
Lead Channel Setting Pacing Amplitude: 2.5 V
Lead Channel Setting Sensing Sensitivity: 5.6 mV
MDC IDC LEAD IMPLANT DT: 20141009
MDC IDC LEAD LOCATION: 753860
MDC IDC LEAD MODEL: 1944
MDC IDC LEAD MODEL: 1948
MDC IDC MSMT BATTERY REMAINING LONGEVITY: 131 mo
MDC IDC MSMT BATTERY VOLTAGE: 2.78 V
MDC IDC MSMT LEADCHNL RA IMPEDANCE VALUE: 589 Ohm
MDC IDC MSMT LEADCHNL RA PACING THRESHOLD AMPLITUDE: 0.625 V
MDC IDC MSMT LEADCHNL RA PACING THRESHOLD PULSEWIDTH: 0.4 ms
MDC IDC PG IMPLANT DT: 20141009
MDC IDC SET LEADCHNL RA PACING AMPLITUDE: 2 V
MDC IDC SET LEADCHNL RV PACING PULSEWIDTH: 0.4 ms
MDC IDC STAT BRADY AP VP PERCENT: 1 %
MDC IDC STAT BRADY AP VS PERCENT: 98 %
MDC IDC STAT BRADY AS VP PERCENT: 0 %

## 2016-09-05 ENCOUNTER — Encounter: Payer: Self-pay | Admitting: Cardiology

## 2016-09-13 DIAGNOSIS — Z713 Dietary counseling and surveillance: Secondary | ICD-10-CM | POA: Diagnosis not present

## 2016-09-13 DIAGNOSIS — Z299 Encounter for prophylactic measures, unspecified: Secondary | ICD-10-CM | POA: Diagnosis not present

## 2016-09-13 DIAGNOSIS — R42 Dizziness and giddiness: Secondary | ICD-10-CM | POA: Diagnosis not present

## 2016-09-13 DIAGNOSIS — I10 Essential (primary) hypertension: Secondary | ICD-10-CM | POA: Diagnosis not present

## 2016-09-13 DIAGNOSIS — I4891 Unspecified atrial fibrillation: Secondary | ICD-10-CM | POA: Diagnosis not present

## 2016-09-29 DIAGNOSIS — I4891 Unspecified atrial fibrillation: Secondary | ICD-10-CM | POA: Diagnosis not present

## 2016-09-29 DIAGNOSIS — E7800 Pure hypercholesterolemia, unspecified: Secondary | ICD-10-CM | POA: Diagnosis not present

## 2016-09-29 DIAGNOSIS — I509 Heart failure, unspecified: Secondary | ICD-10-CM | POA: Diagnosis not present

## 2016-09-29 DIAGNOSIS — I10 Essential (primary) hypertension: Secondary | ICD-10-CM | POA: Diagnosis not present

## 2016-10-12 DIAGNOSIS — N183 Chronic kidney disease, stage 3 (moderate): Secondary | ICD-10-CM | POA: Diagnosis not present

## 2016-10-12 DIAGNOSIS — I10 Essential (primary) hypertension: Secondary | ICD-10-CM | POA: Diagnosis not present

## 2016-10-12 DIAGNOSIS — E7800 Pure hypercholesterolemia, unspecified: Secondary | ICD-10-CM | POA: Diagnosis not present

## 2016-10-12 DIAGNOSIS — E039 Hypothyroidism, unspecified: Secondary | ICD-10-CM | POA: Diagnosis not present

## 2016-10-12 DIAGNOSIS — Z299 Encounter for prophylactic measures, unspecified: Secondary | ICD-10-CM | POA: Diagnosis not present

## 2016-10-12 DIAGNOSIS — Z6824 Body mass index (BMI) 24.0-24.9, adult: Secondary | ICD-10-CM | POA: Diagnosis not present

## 2016-10-19 ENCOUNTER — Other Ambulatory Visit: Payer: Self-pay | Admitting: Internal Medicine

## 2016-10-25 DIAGNOSIS — I4891 Unspecified atrial fibrillation: Secondary | ICD-10-CM | POA: Diagnosis not present

## 2016-10-25 DIAGNOSIS — I509 Heart failure, unspecified: Secondary | ICD-10-CM | POA: Diagnosis not present

## 2016-10-25 DIAGNOSIS — E7800 Pure hypercholesterolemia, unspecified: Secondary | ICD-10-CM | POA: Diagnosis not present

## 2016-10-25 DIAGNOSIS — I10 Essential (primary) hypertension: Secondary | ICD-10-CM | POA: Diagnosis not present

## 2016-11-03 ENCOUNTER — Encounter: Payer: PPO | Admitting: Internal Medicine

## 2016-11-17 DIAGNOSIS — I509 Heart failure, unspecified: Secondary | ICD-10-CM | POA: Diagnosis not present

## 2016-11-17 DIAGNOSIS — I10 Essential (primary) hypertension: Secondary | ICD-10-CM | POA: Diagnosis not present

## 2016-11-17 DIAGNOSIS — E7800 Pure hypercholesterolemia, unspecified: Secondary | ICD-10-CM | POA: Diagnosis not present

## 2016-11-17 DIAGNOSIS — I4891 Unspecified atrial fibrillation: Secondary | ICD-10-CM | POA: Diagnosis not present

## 2016-11-18 ENCOUNTER — Encounter: Payer: Self-pay | Admitting: Internal Medicine

## 2016-11-18 ENCOUNTER — Encounter (INDEPENDENT_AMBULATORY_CARE_PROVIDER_SITE_OTHER): Payer: Self-pay

## 2016-11-18 ENCOUNTER — Ambulatory Visit (INDEPENDENT_AMBULATORY_CARE_PROVIDER_SITE_OTHER): Payer: PPO | Admitting: Internal Medicine

## 2016-11-18 VITALS — BP 130/84 | HR 78 | Ht 65.0 in | Wt 136.6 lb

## 2016-11-18 DIAGNOSIS — I4891 Unspecified atrial fibrillation: Secondary | ICD-10-CM

## 2016-11-18 DIAGNOSIS — Z950 Presence of cardiac pacemaker: Secondary | ICD-10-CM | POA: Diagnosis not present

## 2016-11-18 LAB — CUP PACEART INCLINIC DEVICE CHECK
Battery Impedance: 134 Ohm
Battery Voltage: 2.79 V
Brady Statistic AP VP Percent: 1 %
Brady Statistic AP VS Percent: 99 %
Brady Statistic AS VP Percent: 0 %
Implantable Lead Implant Date: 20141009
Implantable Lead Model: 1944
Implantable Lead Model: 1948
Lead Channel Impedance Value: 629 Ohm
Lead Channel Impedance Value: 939 Ohm
Lead Channel Pacing Threshold Amplitude: 0.75 V
Lead Channel Pacing Threshold Pulse Width: 0.4 ms
Lead Channel Pacing Threshold Pulse Width: 0.4 ms
Lead Channel Sensing Intrinsic Amplitude: 4 mV
MDC IDC LEAD IMPLANT DT: 20141009
MDC IDC LEAD LOCATION: 753859
MDC IDC LEAD LOCATION: 753860
MDC IDC MSMT BATTERY REMAINING LONGEVITY: 138 mo
MDC IDC MSMT LEADCHNL RV PACING THRESHOLD AMPLITUDE: 0.75 V
MDC IDC MSMT LEADCHNL RV SENSING INTR AMPL: 22.4 mV
MDC IDC PG IMPLANT DT: 20141009
MDC IDC SESS DTM: 20180202154231
MDC IDC SET LEADCHNL RA PACING AMPLITUDE: 2 V
MDC IDC SET LEADCHNL RV PACING AMPLITUDE: 2.5 V
MDC IDC SET LEADCHNL RV PACING PULSEWIDTH: 0.4 ms
MDC IDC SET LEADCHNL RV SENSING SENSITIVITY: 5.6 mV
MDC IDC STAT BRADY AS VS PERCENT: 0 %

## 2016-11-18 NOTE — Progress Notes (Signed)
Patient ID: Jessica Myers, female   DOB: [birth year], 83 y.o.   MRN: [id number] Jessica Myers is an 83 y.o. female.   Chief Complaint: syncope HPI: The patient is a very pleasant 83 year old woman who returns today for PPM followup. She has a h/o cryptogenic stroke, s/p ILR, who then developed syncope and was found to have a pause and atrial fib and underwent PPM insertion. She has had problems with orthostasis in the past. She denies chest pain and her sob has improved. She occasionally will take a benzodiazepine tablet for anxiety.   Past Medical History:  Diagnosis Date  . A-fib ([place])   . Anxiety   . Bradycardia    s/p PPM October 2014  . Chronic anticoagulation   . Headache(784.0)   . High cholesterol   . Hypertension   . Multiple rib fractures 07/01/2016   fell down stairs   . Pulmonary contusion 06/2016   from a fall   . Stroke [facility]) September 2014   thrombolytic therapy  . Thyroid disease   . Vertical vertigo     Past Surgical History:  Procedure Laterality Date  . CHOLECYSTECTOMY    . PACEMAKER INSERTION    . PERMANENT PACEMAKER INSERTION N/A 07/25/2013   Procedure: PERMANENT PACEMAKER INSERTION;  Surgeon: Deboraha Sprang, MD;  Location: [facility] CATH LAB;  Service: Cardiovascular;  Laterality: N/A;  . TEE WITHOUT CARDIOVERSION N/A 07/16/2013   Procedure: TRANSESOPHAGEAL ECHOCARDIOGRAM (TEE);  Surgeon: Thayer Headings, MD;  Location: [facility] ENDOSCOPY;  Service: Cardiovascular;  Laterality: N/A;    Family History  Problem Relation Age of Onset  . Cancer Mother   . Cancer Father    Social History:  reports that she has never smoked. She has never used smokeless tobacco. She reports that she does not drink alcohol or use drugs.  Allergies:  Allergies  Allergen Reactions  . Biaxin [Clarithromycin] Shortness Of Breath and Rash    Unknown  . Penicillins Hives    Has patient had a PCN reaction causing immediate rash, facial/tongue/throat swelling, SOB or lightheadedness with  hypotension: Yes Has patient had a PCN reaction causing severe rash involving mucus membranes or skin necrosis: No Has patient had a PCN reaction that required hospitalization No Has patient had a PCN reaction occurring within the last 10 years: No If all of the above answers are "NO", then may proceed with Cephalosporin use.    . Shellfish Allergy Nausea And Vomiting  . Sulfa Antibiotics Other (See Comments)    "bad reaction," per patient  . Celebrex [Celecoxib] Rash     (Not in a hospital admission)  No results found for this or any previous visit (from the past 48 hour(s)). No results found.  ROS - all systems reviewed and negative except as noted in the history of present illness.  Physical Exam  Blood pressure 118/80, pulse 71, respirations 18 Well appearing 83 year old woman, NAD HEENT: Unremarkable Neck:  6 cm JVD, no thyromegally Back:  No CVA tenderness Lungs:  Clear with no wheezes, rales, or rhonchi. Well-healed pacemaker incision HEART:  Regular rate rhythm, no murmurs, no rubs, no clicks Abd:  Flat, soft, positive bowel sounds, no organomegally, no rebound, no guarding Ext:  2 plus pulses, no edema, no cyanosis, no clubbing Skin:  No rashes no nodules Neuro:  CN II through XII intact, motor grossly intact  PM interogation - normal device function. No atrial fib since May 16   Assessment/Plan  1. PAF - continue beta blocker  and flecainide 75 bid. No atrial fib. 2. Syncope - she has had no recurrent episodes since her PPM was placed 3. Falls - she has had a couple. At this point the risk/benefit of anti-coagulation still is in favor of continuing her Eliquis. 4. HTN - her blood pressure is well controlled. No change in her meds. 5. Stroke - she will continue her Eliquis. If she continues to fall, might consider stopping Eliquis.  Cristopher Peru 11/18/2016, 2:17 PM

## 2016-11-18 NOTE — Patient Instructions (Addendum)
Medication Instructions:  Your physician recommends that you continue on your current medications as directed. Please refer to the Current Medication list given to you today.   Labwork: None Ordered   Testing/Procedures: None Ordered   Follow-Up: Your physician wants you to follow-up in: 1 year with Dr. Lovena Le. You will receive a reminder letter in the mail two months in advance. If you don't receive a letter, please call our office to schedule the follow-up appointment.  Remote monitoring is used to monitor your Pacemaker from home. This monitoring reduces the number of office visits required to check your device to one time per year. It allows [place] to keep an eye on the functioning of your device to ensure it is working properly. You are scheduled for a device check from home on 02/20/17. You may send your transmission at any time that day. If you have a wireless device, the transmission will be sent automatically. After your physician reviews your transmission, you will receive a postcard with your next transmission date.    Any Other Special Instructions Will Be Listed Below (If Applicable).     If you need a refill on your cardiac medications before your next appointment, please call your pharmacy.

## 2016-12-08 ENCOUNTER — Encounter: Payer: PPO | Admitting: Internal Medicine

## 2016-12-12 ENCOUNTER — Other Ambulatory Visit: Payer: Self-pay | Admitting: Internal Medicine

## 2016-12-29 DIAGNOSIS — Z713 Dietary counseling and surveillance: Secondary | ICD-10-CM | POA: Diagnosis not present

## 2016-12-29 DIAGNOSIS — Z789 Other specified health status: Secondary | ICD-10-CM | POA: Diagnosis not present

## 2016-12-29 DIAGNOSIS — Z6824 Body mass index (BMI) 24.0-24.9, adult: Secondary | ICD-10-CM | POA: Diagnosis not present

## 2016-12-29 DIAGNOSIS — E7800 Pure hypercholesterolemia, unspecified: Secondary | ICD-10-CM | POA: Diagnosis not present

## 2016-12-29 DIAGNOSIS — Z299 Encounter for prophylactic measures, unspecified: Secondary | ICD-10-CM | POA: Diagnosis not present

## 2016-12-29 DIAGNOSIS — E039 Hypothyroidism, unspecified: Secondary | ICD-10-CM | POA: Diagnosis not present

## 2016-12-29 DIAGNOSIS — I4891 Unspecified atrial fibrillation: Secondary | ICD-10-CM | POA: Diagnosis not present

## 2016-12-29 DIAGNOSIS — N183 Chronic kidney disease, stage 3 (moderate): Secondary | ICD-10-CM | POA: Diagnosis not present

## 2016-12-29 DIAGNOSIS — I10 Essential (primary) hypertension: Secondary | ICD-10-CM | POA: Diagnosis not present

## 2017-01-13 ENCOUNTER — Other Ambulatory Visit: Payer: Self-pay | Admitting: Internal Medicine

## 2017-01-14 ENCOUNTER — Other Ambulatory Visit: Payer: Self-pay | Admitting: Internal Medicine

## 2017-01-14 DIAGNOSIS — I4891 Unspecified atrial fibrillation: Secondary | ICD-10-CM

## 2017-01-16 NOTE — Telephone Encounter (Signed)
Pt last saw Dr Lovena Le 11/18/16, last labs 07/15/16 Creat 1.26, Age 83, weight 62 kg, based on specified criteria pt is on appropriate dosage of Eliquis 5mg  BID.  Will refill rx.

## 2017-02-09 DIAGNOSIS — Z950 Presence of cardiac pacemaker: Secondary | ICD-10-CM | POA: Diagnosis not present

## 2017-02-09 DIAGNOSIS — N183 Chronic kidney disease, stage 3 (moderate): Secondary | ICD-10-CM | POA: Diagnosis not present

## 2017-02-09 DIAGNOSIS — Z789 Other specified health status: Secondary | ICD-10-CM | POA: Diagnosis not present

## 2017-02-09 DIAGNOSIS — K5900 Constipation, unspecified: Secondary | ICD-10-CM | POA: Diagnosis not present

## 2017-02-09 DIAGNOSIS — E7800 Pure hypercholesterolemia, unspecified: Secondary | ICD-10-CM | POA: Diagnosis not present

## 2017-02-09 DIAGNOSIS — E039 Hypothyroidism, unspecified: Secondary | ICD-10-CM | POA: Diagnosis not present

## 2017-02-09 DIAGNOSIS — Z299 Encounter for prophylactic measures, unspecified: Secondary | ICD-10-CM | POA: Diagnosis not present

## 2017-02-09 DIAGNOSIS — I10 Essential (primary) hypertension: Secondary | ICD-10-CM | POA: Diagnosis not present

## 2017-02-09 DIAGNOSIS — Z713 Dietary counseling and surveillance: Secondary | ICD-10-CM | POA: Diagnosis not present

## 2017-02-09 DIAGNOSIS — Z6824 Body mass index (BMI) 24.0-24.9, adult: Secondary | ICD-10-CM | POA: Diagnosis not present

## 2017-02-09 DIAGNOSIS — I4891 Unspecified atrial fibrillation: Secondary | ICD-10-CM | POA: Diagnosis not present

## 2017-02-09 DIAGNOSIS — M1710 Unilateral primary osteoarthritis, unspecified knee: Secondary | ICD-10-CM | POA: Diagnosis not present

## 2017-02-20 ENCOUNTER — Ambulatory Visit (INDEPENDENT_AMBULATORY_CARE_PROVIDER_SITE_OTHER): Payer: PPO | Admitting: *Deleted

## 2017-02-20 DIAGNOSIS — I495 Sick sinus syndrome: Secondary | ICD-10-CM

## 2017-02-21 LAB — CUP PACEART REMOTE DEVICE CHECK
Battery Impedance: 158 Ohm
Battery Voltage: 2.78 V
Brady Statistic AS VS Percent: 0 %
Implantable Lead Implant Date: 20141009
Implantable Lead Location: 753859
Implantable Lead Model: 1944
Lead Channel Impedance Value: 570 Ohm
Lead Channel Pacing Threshold Amplitude: 0.875 V
Lead Channel Pacing Threshold Pulse Width: 0.4 ms
Lead Channel Sensing Intrinsic Amplitude: 16 mV
Lead Channel Setting Pacing Amplitude: 2 V
Lead Channel Setting Pacing Amplitude: 2.5 V
Lead Channel Setting Sensing Sensitivity: 5.6 mV
MDC IDC LEAD IMPLANT DT: 20141009
MDC IDC LEAD LOCATION: 753860
MDC IDC MSMT BATTERY REMAINING LONGEVITY: 131 mo
MDC IDC MSMT LEADCHNL RA PACING THRESHOLD AMPLITUDE: 0.75 V
MDC IDC MSMT LEADCHNL RA PACING THRESHOLD PULSEWIDTH: 0.4 ms
MDC IDC MSMT LEADCHNL RV IMPEDANCE VALUE: 934 Ohm
MDC IDC PG IMPLANT DT: 20141009
MDC IDC SESS DTM: 20180507160346
MDC IDC SET LEADCHNL RV PACING PULSEWIDTH: 0.4 ms
MDC IDC STAT BRADY AP VP PERCENT: 1 %
MDC IDC STAT BRADY AP VS PERCENT: 99 %
MDC IDC STAT BRADY AS VP PERCENT: 0 %

## 2017-02-21 NOTE — Progress Notes (Signed)
Remote pacemaker transmission.   

## 2017-02-24 ENCOUNTER — Encounter: Payer: Self-pay | Admitting: Cardiology

## 2017-05-01 DIAGNOSIS — L03116 Cellulitis of left lower limb: Secondary | ICD-10-CM | POA: Diagnosis not present

## 2017-05-01 DIAGNOSIS — Z713 Dietary counseling and surveillance: Secondary | ICD-10-CM | POA: Diagnosis not present

## 2017-05-01 DIAGNOSIS — Z6824 Body mass index (BMI) 24.0-24.9, adult: Secondary | ICD-10-CM | POA: Diagnosis not present

## 2017-05-01 DIAGNOSIS — E7800 Pure hypercholesterolemia, unspecified: Secondary | ICD-10-CM | POA: Diagnosis not present

## 2017-05-01 DIAGNOSIS — N183 Chronic kidney disease, stage 3 (moderate): Secondary | ICD-10-CM | POA: Diagnosis not present

## 2017-05-01 DIAGNOSIS — E039 Hypothyroidism, unspecified: Secondary | ICD-10-CM | POA: Diagnosis not present

## 2017-05-01 DIAGNOSIS — I10 Essential (primary) hypertension: Secondary | ICD-10-CM | POA: Diagnosis not present

## 2017-05-01 DIAGNOSIS — I4891 Unspecified atrial fibrillation: Secondary | ICD-10-CM | POA: Diagnosis not present

## 2017-05-01 DIAGNOSIS — Z299 Encounter for prophylactic measures, unspecified: Secondary | ICD-10-CM | POA: Diagnosis not present

## 2017-05-03 DIAGNOSIS — Z803 Family history of malignant neoplasm of breast: Secondary | ICD-10-CM | POA: Diagnosis not present

## 2017-05-03 DIAGNOSIS — Z1231 Encounter for screening mammogram for malignant neoplasm of breast: Secondary | ICD-10-CM | POA: Diagnosis not present

## 2017-05-22 ENCOUNTER — Ambulatory Visit (INDEPENDENT_AMBULATORY_CARE_PROVIDER_SITE_OTHER): Payer: PPO | Admitting: *Deleted

## 2017-05-22 DIAGNOSIS — I495 Sick sinus syndrome: Secondary | ICD-10-CM | POA: Diagnosis not present

## 2017-05-23 NOTE — Progress Notes (Signed)
Remote pacemaker transmission.   

## 2017-05-24 ENCOUNTER — Encounter: Payer: Self-pay | Admitting: Cardiology

## 2017-06-08 LAB — CUP PACEART REMOTE DEVICE CHECK
Battery Remaining Longevity: 130 mo
Battery Voltage: 2.79 V
Implantable Lead Implant Date: 20141009
Implantable Lead Location: 753860
Implantable Lead Model: 1948
Implantable Pulse Generator Implant Date: 20141009
Lead Channel Impedance Value: 560 Ohm
Lead Channel Pacing Threshold Amplitude: 0.75 V
Lead Channel Pacing Threshold Amplitude: 0.875 V
Lead Channel Pacing Threshold Pulse Width: 0.4 ms
Lead Channel Setting Pacing Amplitude: 2 V
Lead Channel Setting Pacing Pulse Width: 0.4 ms
Lead Channel Setting Sensing Sensitivity: 5.6 mV
MDC IDC LEAD IMPLANT DT: 20141009
MDC IDC LEAD LOCATION: 753859
MDC IDC MSMT BATTERY IMPEDANCE: 158 Ohm
MDC IDC MSMT LEADCHNL RA PACING THRESHOLD PULSEWIDTH: 0.4 ms
MDC IDC MSMT LEADCHNL RV IMPEDANCE VALUE: 854 Ohm
MDC IDC SESS DTM: 20180806123459
MDC IDC SET LEADCHNL RV PACING AMPLITUDE: 2.5 V
MDC IDC STAT BRADY AP VP PERCENT: 1 %
MDC IDC STAT BRADY AP VS PERCENT: 99 %
MDC IDC STAT BRADY AS VP PERCENT: 0 %
MDC IDC STAT BRADY AS VS PERCENT: 0 %

## 2017-06-22 DIAGNOSIS — I4891 Unspecified atrial fibrillation: Secondary | ICD-10-CM | POA: Diagnosis not present

## 2017-06-22 DIAGNOSIS — Z713 Dietary counseling and surveillance: Secondary | ICD-10-CM | POA: Diagnosis not present

## 2017-06-22 DIAGNOSIS — Z6824 Body mass index (BMI) 24.0-24.9, adult: Secondary | ICD-10-CM | POA: Diagnosis not present

## 2017-06-22 DIAGNOSIS — R42 Dizziness and giddiness: Secondary | ICD-10-CM | POA: Diagnosis not present

## 2017-06-22 DIAGNOSIS — I10 Essential (primary) hypertension: Secondary | ICD-10-CM | POA: Diagnosis not present

## 2017-06-22 DIAGNOSIS — N390 Urinary tract infection, site not specified: Secondary | ICD-10-CM | POA: Diagnosis not present

## 2017-06-22 DIAGNOSIS — R300 Dysuria: Secondary | ICD-10-CM | POA: Diagnosis not present

## 2017-06-22 DIAGNOSIS — Z299 Encounter for prophylactic measures, unspecified: Secondary | ICD-10-CM | POA: Diagnosis not present

## 2017-06-27 DIAGNOSIS — H26493 Other secondary cataract, bilateral: Secondary | ICD-10-CM | POA: Diagnosis not present

## 2017-06-27 DIAGNOSIS — H353131 Nonexudative age-related macular degeneration, bilateral, early dry stage: Secondary | ICD-10-CM | POA: Diagnosis not present

## 2017-06-27 DIAGNOSIS — H04123 Dry eye syndrome of bilateral lacrimal glands: Secondary | ICD-10-CM | POA: Diagnosis not present

## 2017-07-10 ENCOUNTER — Other Ambulatory Visit: Payer: Self-pay | Admitting: *Deleted

## 2017-07-10 MED ORDER — APIXABAN 5 MG PO TABS: 5.0000 mg | ORAL_TABLET | Freq: Two times a day (BID) | ORAL | 1 refills | Status: DC

## 2017-07-10 NOTE — Telephone Encounter (Signed)
Pt last saw Dr Lovena Le on 11/18/16, last labs 07/15/16 Creat 1.26, age 83, weight 62kg, based on specified criteria pt is on appropriate dosage of Eliquis 5mg  BID.  Will refill rx. Note placed on appt recall for Dr Lovena Le to draw BMP and CBC f/u Eliquis labwork at next OV.

## 2017-07-12 DIAGNOSIS — E039 Hypothyroidism, unspecified: Secondary | ICD-10-CM | POA: Diagnosis not present

## 2017-07-12 DIAGNOSIS — Z299 Encounter for prophylactic measures, unspecified: Secondary | ICD-10-CM | POA: Diagnosis not present

## 2017-07-12 DIAGNOSIS — Z1211 Encounter for screening for malignant neoplasm of colon: Secondary | ICD-10-CM | POA: Diagnosis not present

## 2017-07-12 DIAGNOSIS — Z0000 Encounter for general adult medical examination without abnormal findings: Secondary | ICD-10-CM | POA: Diagnosis not present

## 2017-07-12 DIAGNOSIS — Z79899 Other long term (current) drug therapy: Secondary | ICD-10-CM | POA: Diagnosis not present

## 2017-07-12 DIAGNOSIS — Z6824 Body mass index (BMI) 24.0-24.9, adult: Secondary | ICD-10-CM | POA: Diagnosis not present

## 2017-07-12 DIAGNOSIS — Z1389 Encounter for screening for other disorder: Secondary | ICD-10-CM | POA: Diagnosis not present

## 2017-07-12 DIAGNOSIS — N183 Chronic kidney disease, stage 3 (moderate): Secondary | ICD-10-CM | POA: Diagnosis not present

## 2017-07-12 DIAGNOSIS — R5383 Other fatigue: Secondary | ICD-10-CM | POA: Diagnosis not present

## 2017-07-12 DIAGNOSIS — E559 Vitamin D deficiency, unspecified: Secondary | ICD-10-CM | POA: Diagnosis not present

## 2017-07-12 DIAGNOSIS — Z7189 Other specified counseling: Secondary | ICD-10-CM | POA: Diagnosis not present

## 2017-07-12 DIAGNOSIS — G2581 Restless legs syndrome: Secondary | ICD-10-CM | POA: Diagnosis not present

## 2017-07-12 DIAGNOSIS — E7800 Pure hypercholesterolemia, unspecified: Secondary | ICD-10-CM | POA: Diagnosis not present

## 2017-07-12 DIAGNOSIS — I4891 Unspecified atrial fibrillation: Secondary | ICD-10-CM | POA: Diagnosis not present

## 2017-08-11 DIAGNOSIS — Z299 Encounter for prophylactic measures, unspecified: Secondary | ICD-10-CM | POA: Diagnosis not present

## 2017-08-11 DIAGNOSIS — H6123 Impacted cerumen, bilateral: Secondary | ICD-10-CM | POA: Diagnosis not present

## 2017-08-11 DIAGNOSIS — I10 Essential (primary) hypertension: Secondary | ICD-10-CM | POA: Diagnosis not present

## 2017-08-11 DIAGNOSIS — I4891 Unspecified atrial fibrillation: Secondary | ICD-10-CM | POA: Diagnosis not present

## 2017-08-11 DIAGNOSIS — Z23 Encounter for immunization: Secondary | ICD-10-CM | POA: Diagnosis not present

## 2017-08-11 DIAGNOSIS — Z6823 Body mass index (BMI) 23.0-23.9, adult: Secondary | ICD-10-CM | POA: Diagnosis not present

## 2017-08-11 DIAGNOSIS — E039 Hypothyroidism, unspecified: Secondary | ICD-10-CM | POA: Diagnosis not present

## 2017-08-15 DIAGNOSIS — L820 Inflamed seborrheic keratosis: Secondary | ICD-10-CM | POA: Diagnosis not present

## 2017-08-15 DIAGNOSIS — Z08 Encounter for follow-up examination after completed treatment for malignant neoplasm: Secondary | ICD-10-CM | POA: Diagnosis not present

## 2017-08-15 DIAGNOSIS — Z85828 Personal history of other malignant neoplasm of skin: Secondary | ICD-10-CM | POA: Diagnosis not present

## 2017-08-15 DIAGNOSIS — X32XXXA Exposure to sunlight, initial encounter: Secondary | ICD-10-CM | POA: Diagnosis not present

## 2017-08-15 DIAGNOSIS — L570 Actinic keratosis: Secondary | ICD-10-CM | POA: Diagnosis not present

## 2017-08-21 ENCOUNTER — Ambulatory Visit (INDEPENDENT_AMBULATORY_CARE_PROVIDER_SITE_OTHER): Payer: PPO | Admitting: *Deleted

## 2017-08-21 DIAGNOSIS — I495 Sick sinus syndrome: Secondary | ICD-10-CM

## 2017-08-22 NOTE — Progress Notes (Signed)
Remote pacemaker transmission.   

## 2017-08-23 ENCOUNTER — Encounter: Payer: Self-pay | Admitting: Cardiology

## 2017-08-24 LAB — CUP PACEART REMOTE DEVICE CHECK
Brady Statistic AP VP Percent: 1 %
Brady Statistic AP VS Percent: 99 %
Brady Statistic AS VP Percent: 0 %
Brady Statistic AS VS Percent: 0 %
Date Time Interrogation Session: 20181105205236
Implantable Lead Implant Date: 20141009
Lead Channel Impedance Value: 569 Ohm
Lead Channel Impedance Value: 931 Ohm
Lead Channel Pacing Threshold Amplitude: 0.75 V
Lead Channel Pacing Threshold Pulse Width: 0.4 ms
Lead Channel Setting Sensing Sensitivity: 5.6 mV
MDC IDC LEAD IMPLANT DT: 20141009
MDC IDC LEAD LOCATION: 753859
MDC IDC LEAD LOCATION: 753860
MDC IDC MSMT BATTERY IMPEDANCE: 182 Ohm
MDC IDC MSMT BATTERY REMAINING LONGEVITY: 125 mo
MDC IDC MSMT BATTERY VOLTAGE: 2.79 V
MDC IDC MSMT LEADCHNL RA PACING THRESHOLD AMPLITUDE: 0.75 V
MDC IDC MSMT LEADCHNL RV PACING THRESHOLD PULSEWIDTH: 0.4 ms
MDC IDC PG IMPLANT DT: 20141009
MDC IDC SET LEADCHNL RA PACING AMPLITUDE: 2 V
MDC IDC SET LEADCHNL RV PACING AMPLITUDE: 2.5 V
MDC IDC SET LEADCHNL RV PACING PULSEWIDTH: 0.4 ms

## 2017-09-12 DIAGNOSIS — Z6823 Body mass index (BMI) 23.0-23.9, adult: Secondary | ICD-10-CM | POA: Diagnosis not present

## 2017-09-12 DIAGNOSIS — I10 Essential (primary) hypertension: Secondary | ICD-10-CM | POA: Diagnosis not present

## 2017-09-12 DIAGNOSIS — J029 Acute pharyngitis, unspecified: Secondary | ICD-10-CM | POA: Diagnosis not present

## 2017-09-12 DIAGNOSIS — E7800 Pure hypercholesterolemia, unspecified: Secondary | ICD-10-CM | POA: Diagnosis not present

## 2017-09-12 DIAGNOSIS — Z299 Encounter for prophylactic measures, unspecified: Secondary | ICD-10-CM | POA: Diagnosis not present

## 2017-09-12 DIAGNOSIS — I4891 Unspecified atrial fibrillation: Secondary | ICD-10-CM | POA: Diagnosis not present

## 2017-09-12 DIAGNOSIS — N183 Chronic kidney disease, stage 3 (moderate): Secondary | ICD-10-CM | POA: Diagnosis not present

## 2017-09-19 ENCOUNTER — Other Ambulatory Visit: Payer: Self-pay | Admitting: *Deleted

## 2017-09-19 MED ORDER — APIXABAN 5 MG PO TABS: 5.0000 mg | ORAL_TABLET | Freq: Two times a day (BID) | ORAL | 1 refills | Status: DC

## 2017-09-19 NOTE — Telephone Encounter (Signed)
Lab results received from Dr Manuella Ghazi 07/12/17 labs collected Creat 1.34. Based on specified criteria pt is on appropriate dosage of Eliquis 5mg  BID. Will refill rx.

## 2017-09-19 NOTE — Telephone Encounter (Signed)
Pt last saw Dr Lovena Le 11/18/16, last labs in chart are from 2017, called primary MD Dr Manuella Ghazi and they are faxing labwork from 06/2017, will await those results. Age 83, weight 62kg. Will await lab results to make sure Eliquis 5mg  BID is appropriate dosage prior to refilling rx.

## 2017-09-22 DIAGNOSIS — M549 Dorsalgia, unspecified: Secondary | ICD-10-CM | POA: Diagnosis not present

## 2017-09-22 DIAGNOSIS — Z6824 Body mass index (BMI) 24.0-24.9, adult: Secondary | ICD-10-CM | POA: Diagnosis not present

## 2017-09-22 DIAGNOSIS — Z299 Encounter for prophylactic measures, unspecified: Secondary | ICD-10-CM | POA: Diagnosis not present

## 2017-09-22 DIAGNOSIS — Z713 Dietary counseling and surveillance: Secondary | ICD-10-CM | POA: Diagnosis not present

## 2017-10-01 IMAGING — CR DG SHOULDER 2+V*R*
3 series · 3 of 3 positions shown · non-contrast
Comparison: None.

CLINICAL DATA: Fall several hours ago down her basement stairs;
Large bruise to forehead; Painful right shoulder; Pain in mid back

EXAM:
RIGHT SHOULDER - 2+ VIEW

[shoulder grashey]
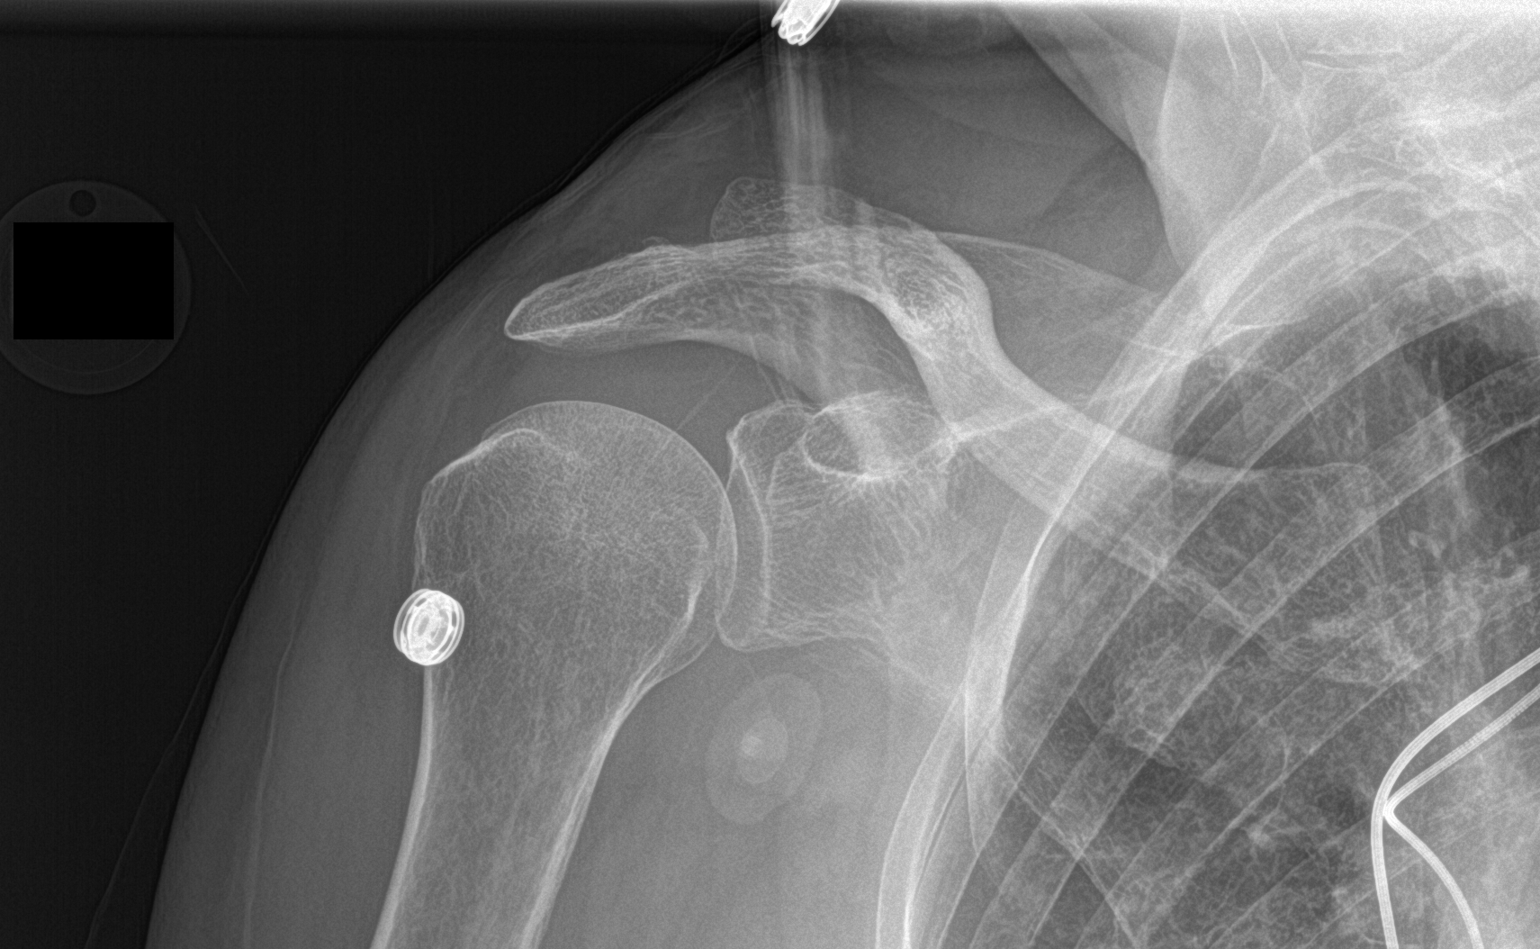

[shoulder y view]
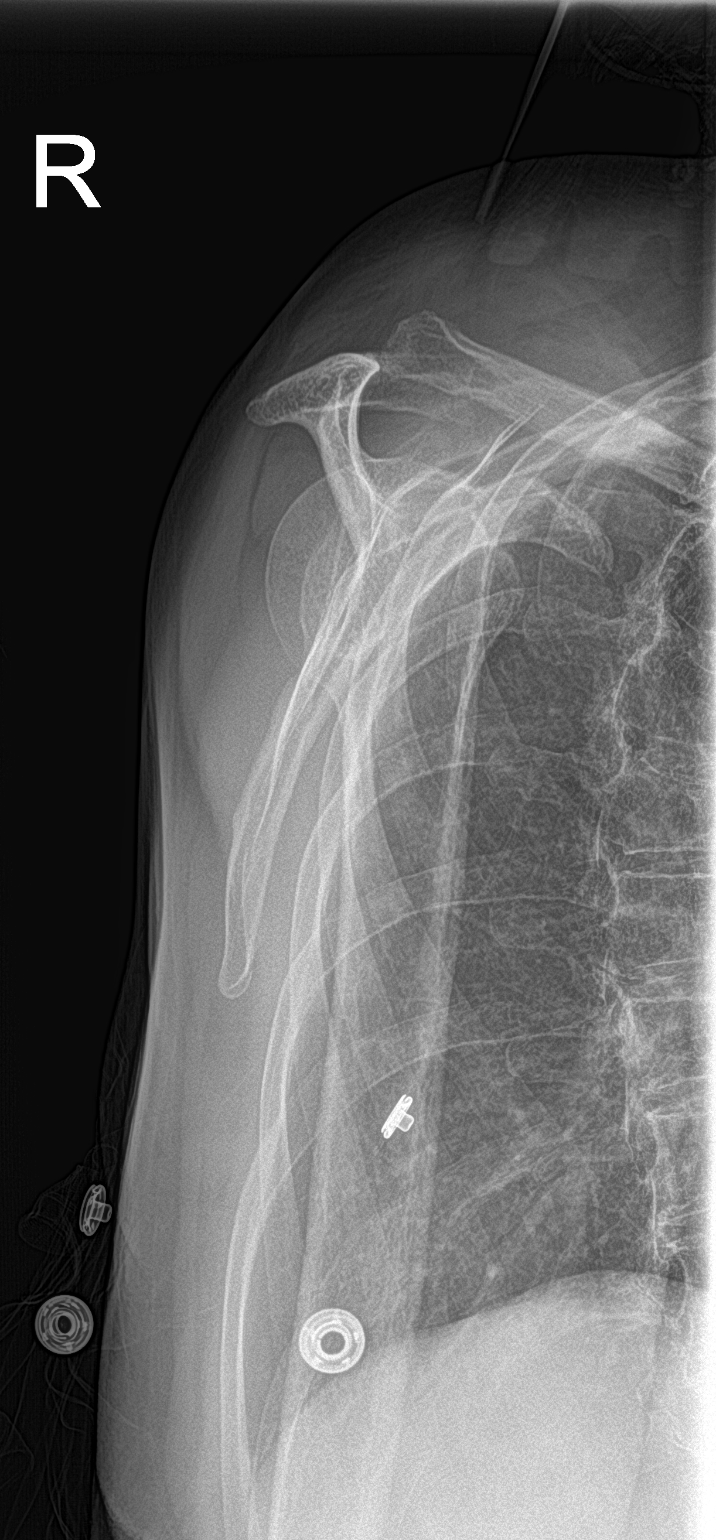

[shoulder axillary]
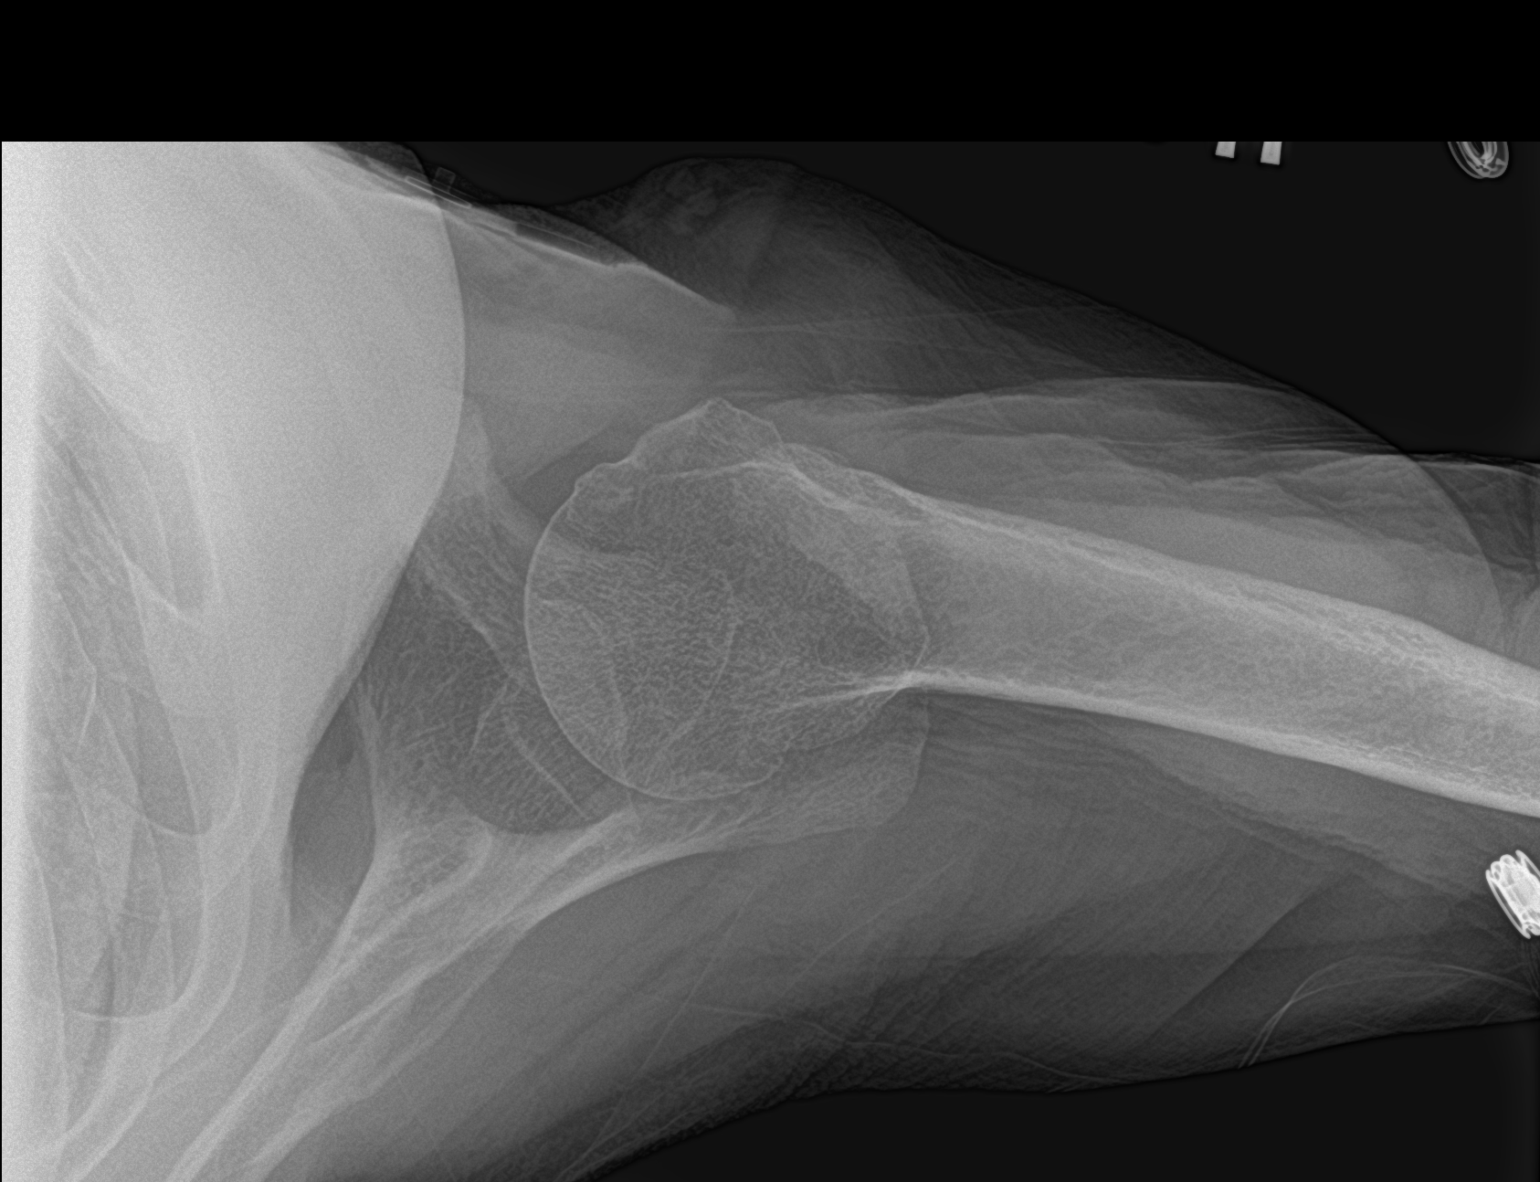

[3 of 3 positions shown; findings below may reference images not displayed]

FINDINGS: No fracture or dislocation. Bones are demineralized. No significant
arthropathic change.

Soft tissues are unremarkable.
IMPRESSION: No fracture or dislocation.

## 2017-10-01 IMAGING — DX DG HAND COMPLETE 3+V*L*
3 series · 3 of 3 positions shown · non-contrast
Comparison: None.

CLINICAL DATA: Left hand pain with swelling and bruising
posteriorly. Fall.

EXAM:
LEFT HAND - COMPLETE 3+ VIEW

[hand pa]
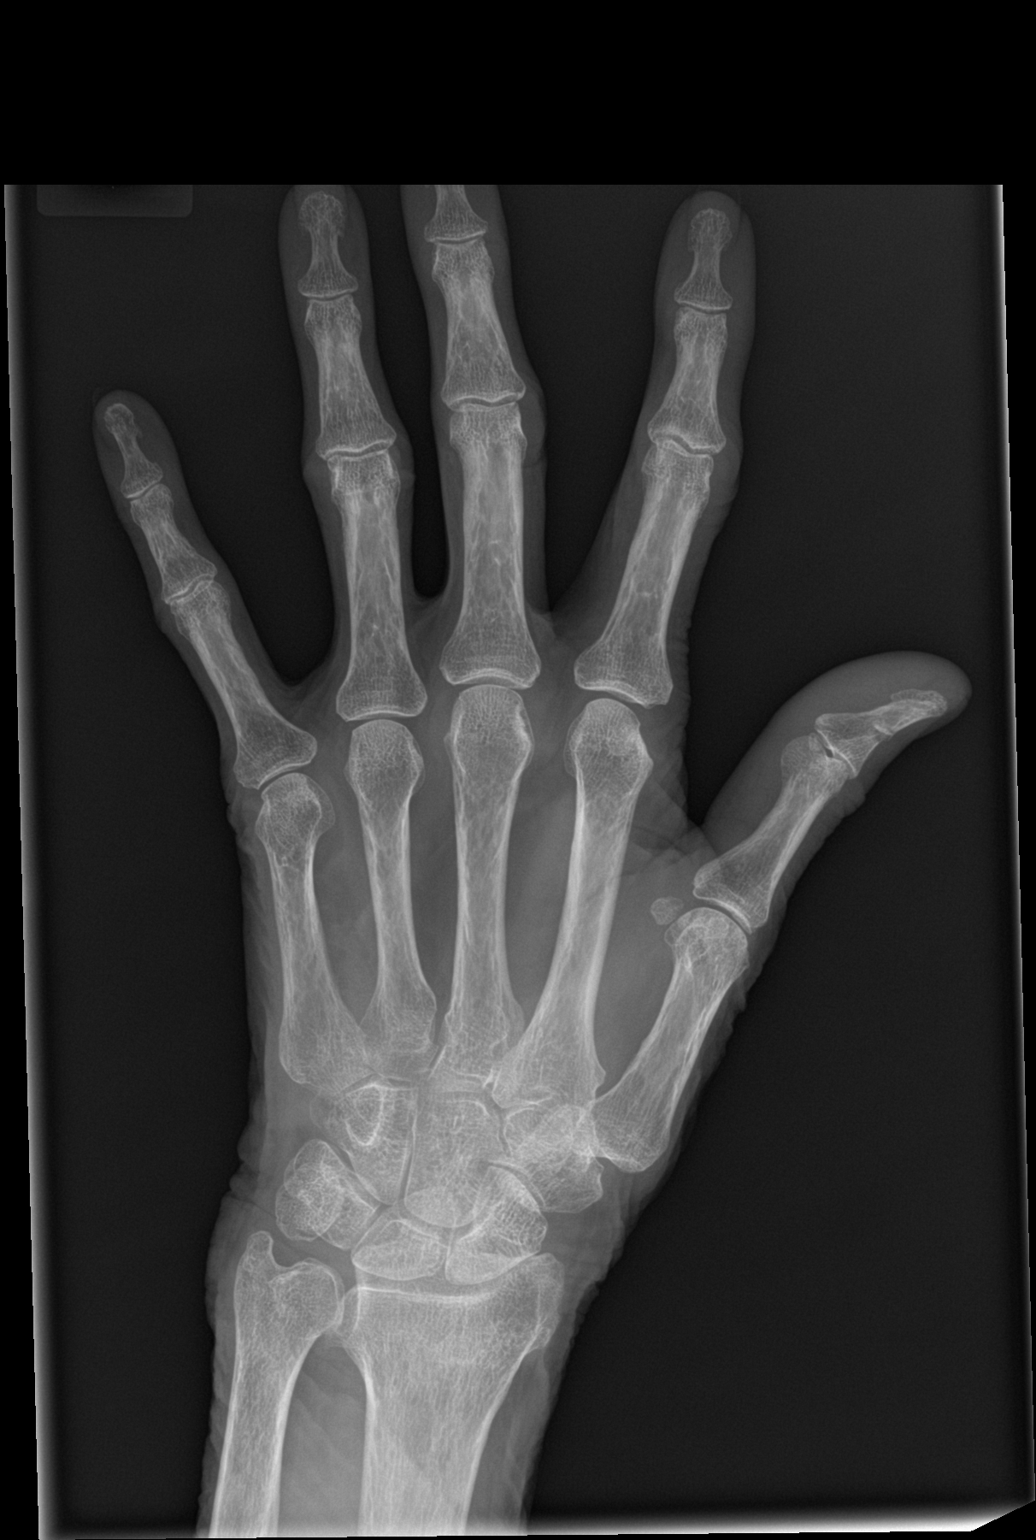

[hand obl]
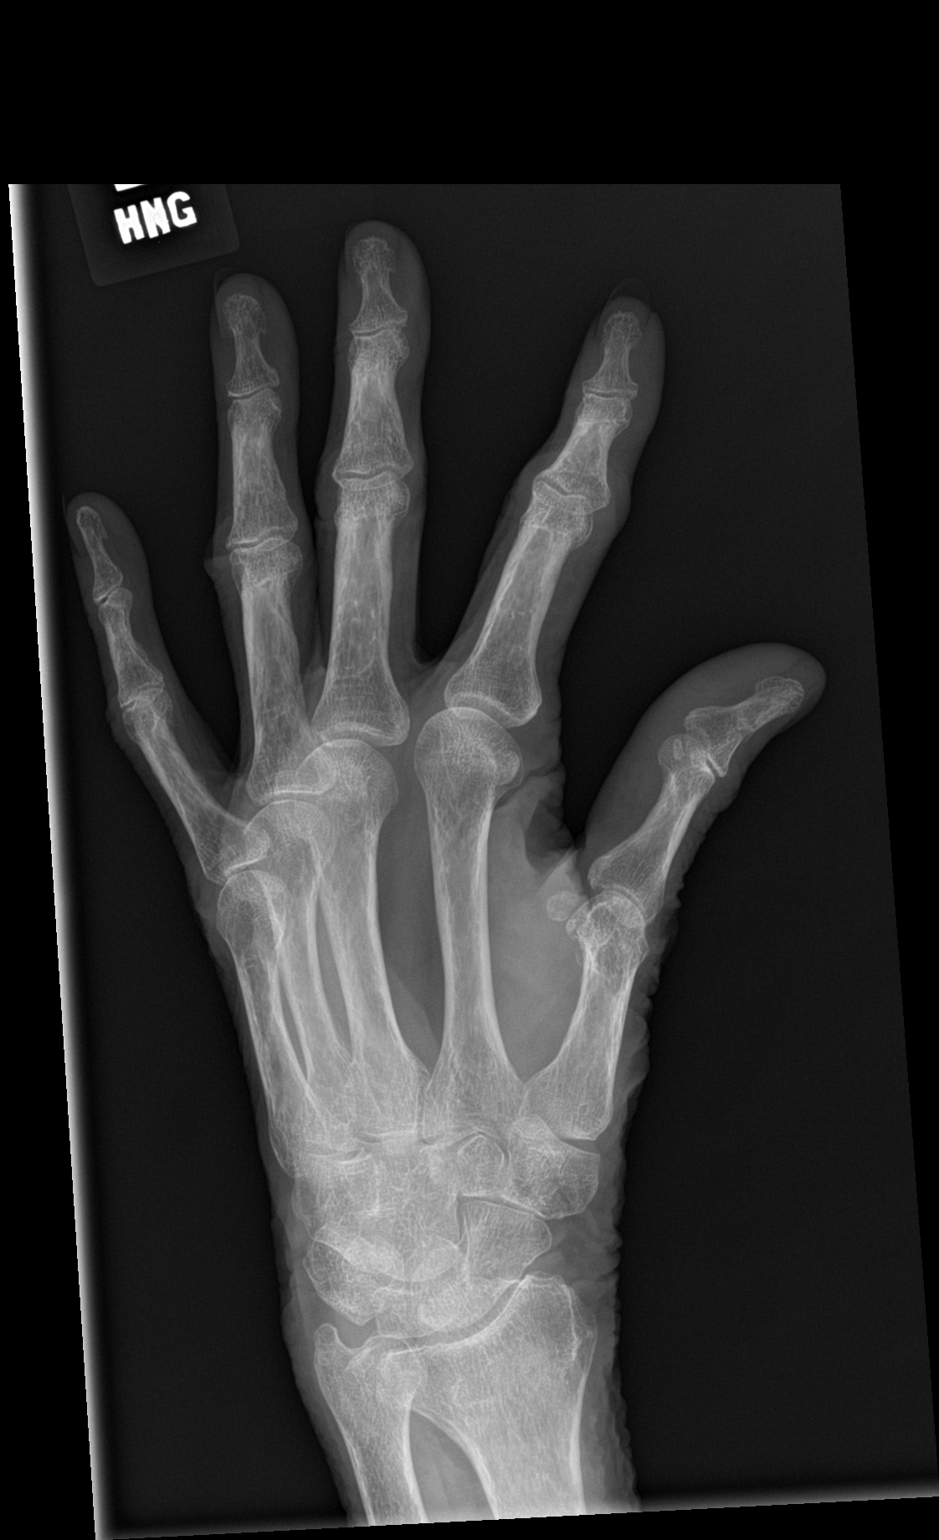

[hand lat]
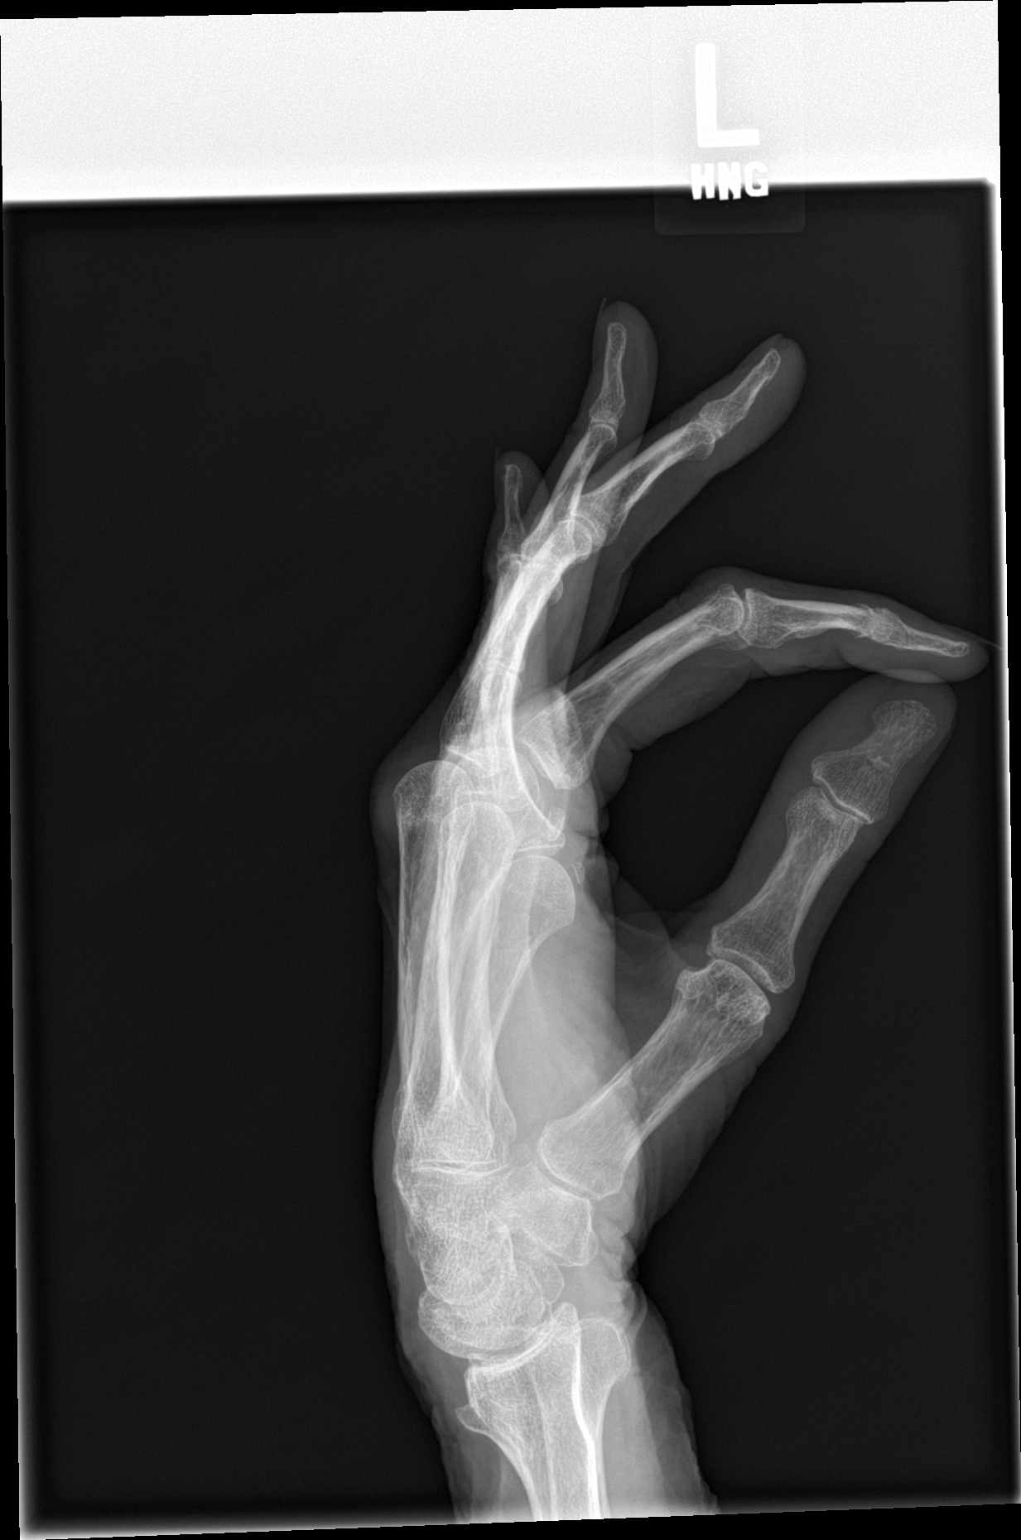

[3 of 3 positions shown; findings below may reference images not displayed]

FINDINGS: Mild degenerative change over the radiocarpal joint and carpal
bones. Minimal degenerative change of the interphalangeal joints.
Mild diffuse osteopenia. There is a minimally displaced fracture of
the first distal phalanx.
IMPRESSION: Minimally displaced fracture of the first distal phalanx.

## 2017-10-27 ENCOUNTER — Other Ambulatory Visit: Payer: Self-pay | Admitting: Internal Medicine

## 2017-10-27 DIAGNOSIS — I4891 Unspecified atrial fibrillation: Secondary | ICD-10-CM

## 2017-10-27 MED ORDER — FLECAINIDE ACETATE 150 MG PO TABS: ORAL_TABLET | ORAL | 0 refills | Status: DC

## 2017-11-20 ENCOUNTER — Telehealth: Payer: Self-pay | Admitting: Cardiology

## 2017-11-20 ENCOUNTER — Ambulatory Visit (INDEPENDENT_AMBULATORY_CARE_PROVIDER_SITE_OTHER): Payer: PPO | Admitting: *Deleted

## 2017-11-20 DIAGNOSIS — I495 Sick sinus syndrome: Secondary | ICD-10-CM | POA: Diagnosis not present

## 2017-11-20 NOTE — Telephone Encounter (Signed)
Spoke with pt and reminded pt of remote transmission that is due today. Pt verbalized understanding.   

## 2017-11-21 ENCOUNTER — Encounter: Payer: Self-pay | Admitting: Cardiology

## 2017-11-21 NOTE — Progress Notes (Signed)
Remote pacemaker transmission.   

## 2017-12-01 ENCOUNTER — Ambulatory Visit: Payer: PPO | Admitting: Internal Medicine

## 2017-12-01 ENCOUNTER — Encounter: Payer: Self-pay | Admitting: Internal Medicine

## 2017-12-01 VITALS — BP 118/74 | HR 82 | Ht 64.0 in | Wt 133.0 lb

## 2017-12-01 DIAGNOSIS — M899 Disorder of bone, unspecified: Secondary | ICD-10-CM

## 2017-12-01 DIAGNOSIS — R220 Localized swelling, mass and lump, head: Secondary | ICD-10-CM | POA: Diagnosis not present

## 2017-12-01 DIAGNOSIS — M898X8 Other specified disorders of bone, other site: Secondary | ICD-10-CM

## 2017-12-01 NOTE — Patient Instructions (Signed)
Medication Instructions:  Your physician recommends that you continue on your current medications as directed. Please refer to the Current Medication list given to you today.   Labwork: NONE   Testing/Procedures: CT of Chest   Follow-Up: Your physician wants you to follow-up in: 1 Year with Dr.Tayor.  You will receive a reminder letter in the mail two months in advance. If you don't receive a letter, please call our office to schedule the follow-up appointment.   Any Other Special Instructions Will Be Listed Below (If Applicable).     If you need a refill on your cardiac medications before your next appointment, please call your pharmacy. Thank you for choosing [facility]!

## 2017-12-02 ENCOUNTER — Encounter: Payer: Self-pay | Admitting: Internal Medicine

## 2017-12-02 NOTE — Progress Notes (Signed)
HPI [name] returns for ongoing followup of PAF, sinus node dysfunction, stroke and HTN. In the interim she has been stable. She denies chest pain or sob. No edema. She is concerned about an area on her sternum. SHe denies weight loss. No chest pain or sob. No edema.   Allergies  Allergen Reactions  . Biaxin [Clarithromycin] Shortness Of Breath and Rash    Unknown  . Penicillins Hives    Has patient had a PCN reaction causing immediate rash, facial/tongue/throat swelling, SOB or lightheadedness with hypotension: Yes Has patient had a PCN reaction causing severe rash involving mucus membranes or skin necrosis: No Has patient had a PCN reaction that required hospitalization No Has patient had a PCN reaction occurring within the last 10 years: No If all of the above answers are "NO", then may proceed with Cephalosporin use.    . Shellfish Allergy Nausea And Vomiting  . Sulfa Antibiotics Other (See Comments)    "bad reaction," per patient  . Celebrex [Celecoxib] Rash     Current Outpatient Medications  Medication Sig Dispense Refill  . acetaminophen (TYLENOL) 500 MG tablet Take 500-1,000 mg by mouth every 6 (six) hours as needed for mild pain.    Marland Kitchen apixaban (ELIQUIS) 5 MG TABS tablet Take 1 tablet (5 mg total) by mouth 2 (two) times daily. 180 tablet 1  . atorvastatin (LIPITOR) 10 MG tablet Take 10 mg by mouth daily.    . cholecalciferol (VITAMIN D) 1000 UNITS tablet Take 1,000 Units by mouth daily.     . flecainide (TAMBOCOR) 150 MG tablet TAKE 1/2 TABLETS (75 MG TOTAL) BY MOUTH 2 (TWO) TIMES DAILY. Please keep upcoming appt in February with Dr. Lovena Le. Thank you 90 tablet 0  . Levothyroxine Sodium 100 MCG CAPS Take 100 mcg by mouth daily before breakfast.     . lisinopril (PRINIVIL,ZESTRIL) 10 MG tablet Take 10 mg by mouth 2 (two) times daily.    Marland Kitchen loratadine (CLARITIN) 10 MG tablet TAKE 1 TABLET (10 MG TOTAL) BY MOUTH DAILY. 30 tablet 2  . meclizine (ANTIVERT) 25 MG tablet  Take 25 mg by mouth 3 (three) times daily as needed for dizziness.    . metoprolol succinate (TOPROL-XL) 25 MG 24 hr tablet TAKE 1 TABLET BY MOUTH 3 TIMES DAILY. 270 tablet 3  . omeprazole (PRILOSEC) 20 MG capsule Take 20 mg by mouth at bedtime.     . polyethylene glycol (MIRALAX / GLYCOLAX) packet Take 17 g by mouth daily.     No current facility-administered medications for this visit.      Past Medical History:  Diagnosis Date  . A-fib ([place])   . Anxiety   . Bradycardia    s/p PPM October 2014  . Chronic anticoagulation   . Headache(784.0)   . High cholesterol   . Hypertension   . Multiple rib fractures 07/01/2016   fell down stairs   . Pulmonary contusion 06/2016   from a fall   . Stroke [facility]) September 2014   thrombolytic therapy  . Thyroid disease   . Vertical vertigo     ROS:   All systems reviewed and negative except as noted in the HPI.   Past Surgical History:  Procedure Laterality Date  . CHOLECYSTECTOMY    . PACEMAKER INSERTION    . PERMANENT PACEMAKER INSERTION N/A 07/25/2013   Procedure: PERMANENT PACEMAKER INSERTION;  Surgeon: Deboraha Sprang, MD;  Location: [facility] CATH LAB;  Service: Cardiovascular;  Laterality: N/A;  .  TEE WITHOUT CARDIOVERSION N/A 07/16/2013   Procedure: TRANSESOPHAGEAL ECHOCARDIOGRAM (TEE);  Surgeon: Thayer Headings, MD;  Location: [facility] ENDOSCOPY;  Service: Cardiovascular;  Laterality: N/A;     Family History  Problem Relation Age of Onset  . Cancer Mother   . Cancer Father      Social History   Socioeconomic History  . Marital status: Married    Spouse name: leroy  . Number of children: 2  . Years of education: 27  . Highest education level: Not on file  Social Needs  . Financial resource strain: Not on file  . Food insecurity - worry: Not on file  . Food insecurity - inability: Not on file  . Transportation needs - medical: Not on file  . Transportation needs - non-medical: Not on file  Occupational History  . Occupation:  retired  Tobacco Use  . Smoking status: Never Smoker  . Smokeless tobacco: Never Used  Substance and Sexual Activity  . Alcohol use: No    Alcohol/week: 0.0 oz  . Drug use: No  . Sexual activity: No  Other Topics Concern  . Not on file  Social History Narrative   Patient lives at home with her husband.     BP 118/74 (BP Location: Right Arm)   Pulse 82   Ht 5\' 4"  (1.626 m)   Wt 133 lb (60.3 kg)   SpO2 98%   BMI 22.83 kg/m   Physical Exam:  Well appearing NAD HEENT: Unremarkable Neck:  No JVD, no thyromegally Lymphatics:  No adenopathy Back:  No CVA tenderness Lungs:  Clear. Chest with a raised 4x6 area minimally tender, no fluctuance or erythema. HEART:  Regular rate rhythm, no murmurs, no rubs, no clicks Abd:  soft, positive bowel sounds, no organomegally, no rebound, no guarding Ext:  2 plus pulses, no edema, no cyanosis, no clubbing Skin:  No rashes no nodules Neuro:  CN II through XII intact, motor grossly intact  EKG -none  DEVICE  Normal device function.  See PaceArt for details.   Assess/Plan: 1. Chest wall growth - I am not sure if there is any actual enlargement or whether or not she has lost weight in the chest area. 2. PAF - she is maintaining NSR 99% of the time 3. PPM - interogation of her [employer] DDD PM demonstrates normal device function. 4. HTN - her blood pressure is well controlled. Will follow.  Mikle Bosworth.D.

## 2017-12-04 ENCOUNTER — Other Ambulatory Visit: Payer: Self-pay

## 2017-12-04 MED ORDER — METOPROLOL SUCCINATE ER 25 MG PO TB24: 25.0000 mg | ORAL_TABLET | Freq: Three times a day (TID) | ORAL | 0 refills | Status: DC

## 2017-12-10 LAB — CUP PACEART REMOTE DEVICE CHECK
Battery Remaining Longevity: 124 mo
Battery Voltage: 2.79 V
Brady Statistic AP VP Percent: 1 %
Brady Statistic AP VS Percent: 99 %
Brady Statistic AS VP Percent: 0 %
Brady Statistic AS VS Percent: 0 %
Implantable Lead Implant Date: 20141009
Implantable Lead Location: 753859
Implantable Lead Location: 753860
Implantable Lead Model: 1948
Implantable Pulse Generator Implant Date: 20141009
Lead Channel Impedance Value: 536 Ohm
Lead Channel Pacing Threshold Amplitude: 0.625 V
Lead Channel Pacing Threshold Amplitude: 0.875 V
Lead Channel Pacing Threshold Pulse Width: 0.4 ms
Lead Channel Pacing Threshold Pulse Width: 0.4 ms
Lead Channel Setting Pacing Amplitude: 2 V
MDC IDC LEAD IMPLANT DT: 20141009
MDC IDC MSMT BATTERY IMPEDANCE: 182 Ohm
MDC IDC MSMT LEADCHNL RV IMPEDANCE VALUE: 901 Ohm
MDC IDC SESS DTM: 20190204203619
MDC IDC SET LEADCHNL RV PACING AMPLITUDE: 2.5 V
MDC IDC SET LEADCHNL RV PACING PULSEWIDTH: 0.46 ms
MDC IDC SET LEADCHNL RV SENSING SENSITIVITY: 5.6 mV

## 2017-12-13 ENCOUNTER — Other Ambulatory Visit (HOSPITAL_COMMUNITY)
Admission: RE | Admit: 2017-12-13 | Discharge: 2017-12-13 | Disposition: A | Discharge status: Home / Self Care | Payer: PPO | Source: Ambulatory Visit | Attending: Internal Medicine | Admitting: Internal Medicine

## 2017-12-13 ENCOUNTER — Ambulatory Visit (HOSPITAL_COMMUNITY)
Admission: RE | Admit: 2017-12-13 | Discharge: 2017-12-13 | Disposition: A | Discharge status: Home / Self Care | Payer: PPO | Source: Ambulatory Visit | Attending: Internal Medicine | Admitting: Internal Medicine

## 2017-12-13 DIAGNOSIS — I2510 Atherosclerotic heart disease of native coronary artery without angina pectoris: Secondary | ICD-10-CM | POA: Insufficient documentation

## 2017-12-13 DIAGNOSIS — M899 Disorder of bone, unspecified: Secondary | ICD-10-CM | POA: Diagnosis not present

## 2017-12-13 DIAGNOSIS — R222 Localized swelling, mass and lump, trunk: Secondary | ICD-10-CM | POA: Diagnosis not present

## 2017-12-13 DIAGNOSIS — I700 Atherosclerosis of aorta: Secondary | ICD-10-CM | POA: Insufficient documentation

## 2017-12-13 DIAGNOSIS — N261 Atrophy of kidney (terminal): Secondary | ICD-10-CM | POA: Insufficient documentation

## 2017-12-13 DIAGNOSIS — R220 Localized swelling, mass and lump, head: Secondary | ICD-10-CM | POA: Diagnosis not present

## 2017-12-13 LAB — BASIC METABOLIC PANEL
ANION GAP: 9 (ref 5–15)
BUN: 19 mg/dL (ref 6–20)
CO2: 27 mmol/L (ref 22–32)
Calcium: 9.7 mg/dL (ref 8.9–10.3)
Chloride: 103 mmol/L (ref 101–111)
Creatinine, Ser: 1.24 mg/dL — ABNORMAL HIGH (ref 0.44–1.00)
GFR calc Af Amer: 44 mL/min — ABNORMAL LOW (ref >60)
GFR calc non Af Amer: 38 mL/min — ABNORMAL LOW (ref >60)
Glucose, Bld: 85 mg/dL (ref 65–99)
POTASSIUM: 4.3 mmol/L (ref 3.5–5.1)
Sodium: 139 mmol/L (ref 135–145)

## 2017-12-13 MED ORDER — IOPAMIDOL (ISOVUE-300) INJECTION 61%
75.0000 mL | Freq: Once | INTRAVENOUS | Status: AC | PRN
  Administered 2017-12-13: 64 mL via INTRAVENOUS

## 2017-12-15 ENCOUNTER — Telehealth: Payer: Self-pay | Admitting: Internal Medicine

## 2017-12-15 DIAGNOSIS — I4891 Unspecified atrial fibrillation: Secondary | ICD-10-CM

## 2017-12-15 NOTE — Telephone Encounter (Signed)
Would like to know results from CT. Also, she had a note to schedule apt for more refills and she was just seen. Please give her a call @ [phone]

## 2017-12-15 NOTE — Telephone Encounter (Signed)
Wants CT results done 12/13/17 not resulted to [place]

## 2017-12-15 NOTE — Telephone Encounter (Signed)
Attempt to reach, got VM, [id number]

## 2017-12-19 ENCOUNTER — Telehealth: Payer: Self-pay | Admitting: *Deleted

## 2017-12-19 ENCOUNTER — Telehealth: Payer: Self-pay

## 2017-12-19 ENCOUNTER — Other Ambulatory Visit: Payer: Self-pay | Admitting: *Deleted

## 2017-12-19 MED ORDER — METOPROLOL SUCCINATE ER 25 MG PO TB24: 25.0000 mg | ORAL_TABLET | Freq: Three times a day (TID) | ORAL | 3 refills | Status: DC

## 2017-12-19 NOTE — Telephone Encounter (Signed)
Pt called office requesting test results to chest CT done on 2/27.

## 2017-12-19 NOTE — Telephone Encounter (Signed)
Left message for Pt notifying of CT results.

## 2018-01-15 ENCOUNTER — Other Ambulatory Visit: Payer: Self-pay | Admitting: Internal Medicine

## 2018-01-15 DIAGNOSIS — Z6823 Body mass index (BMI) 23.0-23.9, adult: Secondary | ICD-10-CM | POA: Diagnosis not present

## 2018-01-15 DIAGNOSIS — Z789 Other specified health status: Secondary | ICD-10-CM | POA: Diagnosis not present

## 2018-01-15 DIAGNOSIS — I4891 Unspecified atrial fibrillation: Secondary | ICD-10-CM | POA: Diagnosis not present

## 2018-01-15 DIAGNOSIS — I10 Essential (primary) hypertension: Secondary | ICD-10-CM | POA: Diagnosis not present

## 2018-01-15 DIAGNOSIS — N183 Chronic kidney disease, stage 3 (moderate): Secondary | ICD-10-CM | POA: Diagnosis not present

## 2018-01-15 DIAGNOSIS — Z950 Presence of cardiac pacemaker: Secondary | ICD-10-CM | POA: Diagnosis not present

## 2018-01-15 DIAGNOSIS — Z299 Encounter for prophylactic measures, unspecified: Secondary | ICD-10-CM | POA: Diagnosis not present

## 2018-01-15 DIAGNOSIS — E039 Hypothyroidism, unspecified: Secondary | ICD-10-CM | POA: Diagnosis not present

## 2018-01-15 DIAGNOSIS — E7800 Pure hypercholesterolemia, unspecified: Secondary | ICD-10-CM | POA: Diagnosis not present

## 2018-01-22 ENCOUNTER — Other Ambulatory Visit: Payer: Self-pay | Admitting: Internal Medicine

## 2018-01-31 ENCOUNTER — Other Ambulatory Visit: Payer: Self-pay | Admitting: Internal Medicine

## 2018-01-31 DIAGNOSIS — I4891 Unspecified atrial fibrillation: Secondary | ICD-10-CM

## 2018-02-19 ENCOUNTER — Ambulatory Visit (INDEPENDENT_AMBULATORY_CARE_PROVIDER_SITE_OTHER): Payer: PPO | Admitting: *Deleted

## 2018-02-19 DIAGNOSIS — I495 Sick sinus syndrome: Secondary | ICD-10-CM | POA: Diagnosis not present

## 2018-02-19 NOTE — Progress Notes (Signed)
Remote pacemaker transmission.   

## 2018-02-20 DIAGNOSIS — L570 Actinic keratosis: Secondary | ICD-10-CM | POA: Diagnosis not present

## 2018-02-20 DIAGNOSIS — T148XXA Other injury of unspecified body region, initial encounter: Secondary | ICD-10-CM | POA: Diagnosis not present

## 2018-02-20 DIAGNOSIS — L821 Other seborrheic keratosis: Secondary | ICD-10-CM | POA: Diagnosis not present

## 2018-02-20 DIAGNOSIS — X32XXXD Exposure to sunlight, subsequent encounter: Secondary | ICD-10-CM | POA: Diagnosis not present

## 2018-02-21 ENCOUNTER — Encounter: Payer: Self-pay | Admitting: Cardiology

## 2018-02-28 ENCOUNTER — Telehealth: Payer: Self-pay | Admitting: Internal Medicine

## 2018-02-28 NOTE — Telephone Encounter (Signed)
Patient with diagnosis of atrial fibrillation on Eliquis for anticoagulation.    Procedure: tooth extraction Date of procedure: TBD  CHADS2-VASc score of  6 (, HTN, AGE, , stroke/tia x 2, , AGE, female)  CrCl 31 Platelet count 239  Per office protocol, we do not stop anticoagulation for single tooth extractions.  In addition patient has a history of stroke, making it unadvisable to discontinue unless absolutely warranted.  She should remain on Eliquis.

## 2018-02-28 NOTE — Telephone Encounter (Signed)
New Message       [facility] Pre-operative Risk Assessment    Request for surgical clearance:  1. What type of surgery is being performed? Tooth Extraction  2. When is this surgery scheduled? TBD   3. What type of clearance is required (medical clearance vs. Pharmacy clearance to hold med vs. Both)? Pharmacy   Are there any medications that need to be held prior to surgery and how long?* Eliquis 2-3 days prior 4. Practice name and name of physician performing surgery? Dr. Clemens Catholic DDS.   5. What is your office phone number [phone]    7.   What is your office fax number [phone]   8.   Anesthesia type (None, local, MAC, general) ? local   Jessica Myers 02/28/2018, 2:24 PM  _________________________________________________________________   (provider comments below)

## 2018-03-01 NOTE — Telephone Encounter (Signed)
Will fax to the office of Dr. Clemens Catholic, DDS

## 2018-03-09 LAB — CUP PACEART REMOTE DEVICE CHECK
Battery Impedance: 230 Ohm
Battery Remaining Longevity: 117 mo
Battery Voltage: 2.78 V
Date Time Interrogation Session: 20190506165315
Implantable Lead Implant Date: 20141009
Implantable Lead Location: 753860
Implantable Lead Model: 1944
Lead Channel Pacing Threshold Pulse Width: 0.4 ms
Lead Channel Setting Pacing Amplitude: 2.5 V
MDC IDC LEAD IMPLANT DT: 20141009
MDC IDC LEAD LOCATION: 753859
MDC IDC MSMT LEADCHNL RA IMPEDANCE VALUE: 553 Ohm
MDC IDC MSMT LEADCHNL RA PACING THRESHOLD AMPLITUDE: 0.75 V
MDC IDC MSMT LEADCHNL RA PACING THRESHOLD PULSEWIDTH: 0.4 ms
MDC IDC MSMT LEADCHNL RV IMPEDANCE VALUE: 958 Ohm
MDC IDC MSMT LEADCHNL RV PACING THRESHOLD AMPLITUDE: 0.75 V
MDC IDC PG IMPLANT DT: 20141009
MDC IDC SET LEADCHNL RA PACING AMPLITUDE: 2 V
MDC IDC SET LEADCHNL RV PACING PULSEWIDTH: 0.4 ms
MDC IDC SET LEADCHNL RV SENSING SENSITIVITY: 5.6 mV
MDC IDC STAT BRADY AP VP PERCENT: 1 %
MDC IDC STAT BRADY AP VS PERCENT: 99 %
MDC IDC STAT BRADY AS VP PERCENT: 0 %
MDC IDC STAT BRADY AS VS PERCENT: 0 %

## 2018-05-21 ENCOUNTER — Ambulatory Visit (INDEPENDENT_AMBULATORY_CARE_PROVIDER_SITE_OTHER): Payer: PPO | Admitting: *Deleted

## 2018-05-21 DIAGNOSIS — I495 Sick sinus syndrome: Secondary | ICD-10-CM | POA: Diagnosis not present

## 2018-05-22 NOTE — Progress Notes (Signed)
Remote pacemaker transmission.   

## 2018-05-28 DIAGNOSIS — Z1231 Encounter for screening mammogram for malignant neoplasm of breast: Secondary | ICD-10-CM | POA: Diagnosis not present

## 2018-05-28 DIAGNOSIS — Z803 Family history of malignant neoplasm of breast: Secondary | ICD-10-CM | POA: Diagnosis not present

## 2018-05-30 DIAGNOSIS — R350 Frequency of micturition: Secondary | ICD-10-CM | POA: Diagnosis not present

## 2018-05-30 DIAGNOSIS — M62830 Muscle spasm of back: Secondary | ICD-10-CM | POA: Diagnosis not present

## 2018-05-30 DIAGNOSIS — I10 Essential (primary) hypertension: Secondary | ICD-10-CM | POA: Diagnosis not present

## 2018-05-30 DIAGNOSIS — Z713 Dietary counseling and surveillance: Secondary | ICD-10-CM | POA: Diagnosis not present

## 2018-05-30 DIAGNOSIS — Z299 Encounter for prophylactic measures, unspecified: Secondary | ICD-10-CM | POA: Diagnosis not present

## 2018-05-30 LAB — CUP PACEART REMOTE DEVICE CHECK
Battery Impedance: 230 Ohm
Battery Remaining Longevity: 118 mo
Battery Voltage: 2.78 V
Implantable Lead Implant Date: 20141009
Implantable Lead Location: 753859
Implantable Lead Location: 753860
Implantable Lead Model: 1944
Implantable Lead Model: 1948
Implantable Pulse Generator Implant Date: 20141009
Lead Channel Pacing Threshold Amplitude: 0.75 V
Lead Channel Pacing Threshold Pulse Width: 0.4 ms
Lead Channel Setting Pacing Amplitude: 2 V
Lead Channel Setting Pacing Amplitude: 2.5 V
Lead Channel Setting Pacing Pulse Width: 0.46 ms
MDC IDC LEAD IMPLANT DT: 20141009
MDC IDC MSMT LEADCHNL RA IMPEDANCE VALUE: 570 Ohm
MDC IDC MSMT LEADCHNL RA PACING THRESHOLD PULSEWIDTH: 0.4 ms
MDC IDC MSMT LEADCHNL RV IMPEDANCE VALUE: 955 Ohm
MDC IDC MSMT LEADCHNL RV PACING THRESHOLD AMPLITUDE: 0.625 V
MDC IDC SESS DTM: 20190805170344
MDC IDC SET LEADCHNL RV SENSING SENSITIVITY: 5.6 mV
MDC IDC STAT BRADY AP VP PERCENT: 1 %
MDC IDC STAT BRADY AP VS PERCENT: 98 %
MDC IDC STAT BRADY AS VP PERCENT: 0 %
MDC IDC STAT BRADY AS VS PERCENT: 0 %

## 2018-06-01 DIAGNOSIS — R922 Inconclusive mammogram: Secondary | ICD-10-CM | POA: Diagnosis not present

## 2018-06-01 DIAGNOSIS — N6311 Unspecified lump in the right breast, upper outer quadrant: Secondary | ICD-10-CM | POA: Diagnosis not present

## 2018-06-11 ENCOUNTER — Other Ambulatory Visit: Payer: Self-pay | Admitting: Radiology

## 2018-06-11 DIAGNOSIS — C50411 Malignant neoplasm of upper-outer quadrant of right female breast: Secondary | ICD-10-CM | POA: Diagnosis not present

## 2018-06-11 DIAGNOSIS — C50811 Malignant neoplasm of overlapping sites of right female breast: Secondary | ICD-10-CM | POA: Diagnosis not present

## 2018-06-14 ENCOUNTER — Telehealth: Payer: Self-pay | Admitting: Hematology and Oncology

## 2018-06-14 NOTE — Telephone Encounter (Signed)
Spoke with patient to confirm afternoon [facility] appointment on 9/4, packet mailed to patient

## 2018-06-15 ENCOUNTER — Encounter: Payer: Self-pay | Admitting: *Deleted

## 2018-06-15 DIAGNOSIS — Z170 Estrogen receptor positive status [ER+]: Principal | ICD-10-CM

## 2018-06-15 DIAGNOSIS — C50411 Malignant neoplasm of upper-outer quadrant of right female breast: Secondary | ICD-10-CM

## 2018-06-15 HISTORY — DX: Estrogen receptor positive status (ER+): Z17.0

## 2018-06-15 HISTORY — DX: Malignant neoplasm of upper-outer quadrant of right female breast: C50.411

## 2018-06-20 ENCOUNTER — Ambulatory Visit
Admission: RE | Admit: 2018-06-20 | Discharge: 2018-06-20 | Disposition: A | Discharge status: Home / Self Care | Payer: PPO | Source: Ambulatory Visit | Attending: Radiation Oncology | Admitting: Radiation Oncology

## 2018-06-20 ENCOUNTER — Encounter: Payer: Self-pay | Admitting: Hematology and Oncology

## 2018-06-20 ENCOUNTER — Ambulatory Visit: Payer: PPO | Admitting: Physical Therapy

## 2018-06-20 ENCOUNTER — Inpatient Hospital Stay: Payer: PPO

## 2018-06-20 ENCOUNTER — Inpatient Hospital Stay: Payer: PPO | Attending: Hematology and Oncology | Admitting: Hematology and Oncology

## 2018-06-20 DIAGNOSIS — C50411 Malignant neoplasm of upper-outer quadrant of right female breast: Secondary | ICD-10-CM

## 2018-06-20 DIAGNOSIS — Z170 Estrogen receptor positive status [ER+]: Principal | ICD-10-CM

## 2018-06-20 DIAGNOSIS — Z950 Presence of cardiac pacemaker: Secondary | ICD-10-CM | POA: Diagnosis not present

## 2018-06-20 LAB — CMP (CANCER CENTER ONLY)
ALK PHOS: 83 U/L (ref 38–126)
ALT: 11 U/L (ref 0–44)
ANION GAP: 7 (ref 5–15)
AST: 17 U/L (ref 15–41)
Albumin: 3.8 g/dL (ref 3.5–5.0)
BILIRUBIN TOTAL: 0.8 mg/dL (ref 0.3–1.2)
BUN: 25 mg/dL — ABNORMAL HIGH (ref 8–23)
CALCIUM: 10.1 mg/dL (ref 8.9–10.3)
CO2: 28 mmol/L (ref 22–32)
Chloride: 106 mmol/L (ref 98–111)
Creatinine: 1.34 mg/dL — ABNORMAL HIGH (ref 0.44–1.00)
GFR, EST AFRICAN AMERICAN: 40 mL/min — AB (ref >60)
GFR, Estimated: 35 mL/min — ABNORMAL LOW (ref >60)
Glucose, Bld: 83 mg/dL (ref 70–99)
Potassium: 4.9 mmol/L (ref 3.5–5.1)
SODIUM: 141 mmol/L (ref 135–145)
TOTAL PROTEIN: 7.3 g/dL (ref 6.5–8.1)

## 2018-06-20 LAB — CBC WITH DIFFERENTIAL (CANCER CENTER ONLY)
BASOS ABS: 0 10*3/uL (ref 0.0–0.1)
BASOS PCT: 1 %
EOS ABS: 0.2 10*3/uL (ref 0.0–0.5)
Eosinophils Relative: 4 %
HEMATOCRIT: 39.9 % (ref 34.8–46.6)
HEMOGLOBIN: 13 g/dL (ref 11.6–15.9)
Lymphocytes Relative: 20 %
Lymphs Abs: 0.9 10*3/uL (ref 0.9–3.3)
MCH: 30.5 pg (ref 25.1–34.0)
MCHC: 32.6 g/dL (ref 31.5–36.0)
MCV: 93.5 fL (ref 79.5–101.0)
MONOS PCT: 10 %
Monocytes Absolute: 0.5 10*3/uL (ref 0.1–0.9)
NEUTROS ABS: 3 10*3/uL (ref 1.5–6.5)
NEUTROS PCT: 65 %
Platelet Count: 178 10*3/uL (ref 145–400)
RBC: 4.26 MIL/uL (ref 3.70–5.45)
RDW: 13 % (ref 11.2–14.5)
WBC Count: 4.6 10*3/uL (ref 3.9–10.3)

## 2018-06-20 NOTE — Progress Notes (Signed)
Radiation Oncology         [phone] ________________________________  Name: Jessica Myers        MRN: [id number]  Date of Service: 06/20/2018 DOB: [birth year]  VC:BSWH, Weldon Picking, MD  Rolm Bookbinder, MD     REFERRING PHYSICIAN: Rolm Bookbinder, MD   DIAGNOSIS: The encounter diagnosis was Malignant neoplasm of upper-outer quadrant of right breast in female, estrogen receptor positive ([place]).   HISTORY OF PRESENT ILLNESS: Jessica Myers is a 84 y.o. female seen in the multidisciplinary breast clinic for a new diagnosis of right breast cancer. The patient was noted to have a new diagnosis of right breast cancer. She had screening assymmetry noted in the right breast that prompted diagnostic imaging and this revealed a 3 mm mass at 12:00, and her axilla was negative for adenopathy. A biopsy on 06/11/18 which revealed a grade 1-2 invasive ductal carcinoma,  ER/PR positive, HER2 negative, with a Ki 67 of 2%. She comes today to discuss options of treatment for her cancer.  PREVIOUS RADIATION THERAPY: No   PAST MEDICAL HISTORY:  Past Medical History:  Diagnosis Date  . A-fib ([place])   . Anxiety   . Bradycardia    s/p PPM October 2014  . Chronic anticoagulation   . Headache(784.0)   . High cholesterol   . Hypertension   . Malignant neoplasm of upper-outer quadrant of right breast in female, estrogen receptor positive ([place]) 06/15/2018  . Multiple rib fractures 07/01/2016   fell down stairs   . Pulmonary contusion 06/2016   from a fall   . Stroke [facility]) September 2014   thrombolytic therapy  . Thyroid disease   . Vertical vertigo        PAST SURGICAL HISTORY: Past Surgical History:  Procedure Laterality Date  . CHOLECYSTECTOMY    . PACEMAKER INSERTION    . PERMANENT PACEMAKER INSERTION N/A 07/25/2013   Procedure: PERMANENT PACEMAKER INSERTION;  Surgeon: Deboraha Sprang, MD;  Location: [facility] CATH LAB;  Service: Cardiovascular;  Laterality: N/A;  . TEE WITHOUT CARDIOVERSION N/A  07/16/2013   Procedure: TRANSESOPHAGEAL ECHOCARDIOGRAM (TEE);  Surgeon: Thayer Headings, MD;  Location: [facility] ENDOSCOPY;  Service: Cardiovascular;  Laterality: N/A;     FAMILY HISTORY:  Family History  Problem Relation Age of Onset  . Cancer Mother   . Cancer Father      SOCIAL HISTORY:  reports that she has never smoked. She has never used smokeless tobacco. She reports that she does not drink alcohol or use drugs. The patient is married and lives in [place].    ALLERGIES: Biaxin [clarithromycin]; Penicillins; Shellfish allergy; Sulfa antibiotics; and Celebrex [celecoxib]   MEDICATIONS:  Current Outpatient Medications  Medication Sig Dispense Refill  . acetaminophen (TYLENOL) 500 MG tablet Take 500-1,000 mg by mouth every 6 (six) hours as needed for mild pain.    Marland Kitchen atorvastatin (LIPITOR) 10 MG tablet Take 10 mg by mouth daily.    . cholecalciferol (VITAMIN D) 1000 UNITS tablet Take 1,000 Units by mouth daily.     Marland Kitchen ELIQUIS 5 MG TABS tablet TAKE 1 TABLET BY MOUTH TWICE A DAY 180 tablet 2  . flecainide (TAMBOCOR) 150 MG tablet TAKE 1/2 TABLETS (75 MG TOTAL) BY MOUTH 2 (TWO) TIMES DAILY. PLEASE KEEP UPCOMING APPT IN FEBRUARY 90 tablet 3  . Levothyroxine Sodium 100 MCG CAPS Take 100 mcg by mouth daily before breakfast.     . lisinopril (PRINIVIL,ZESTRIL) 10 MG tablet Take 10 mg by mouth 2 (two) times  daily.    . loratadine (CLARITIN) 10 MG tablet TAKE 1 TABLET (10 MG TOTAL) BY MOUTH DAILY. 30 tablet 2  . meclizine (ANTIVERT) 25 MG tablet Take 25 mg by mouth 3 (three) times daily as needed for dizziness.    . metoprolol succinate (TOPROL-XL) 25 MG 24 hr tablet Take 1 tablet (25 mg total) by mouth 3 (three) times daily. Please make an appt for more refills 1st attempt 270 tablet 3  . metoprolol succinate (TOPROL-XL) 25 MG 24 hr tablet TAKE 1 TABLET (25 MG TOTAL) BY MOUTH TIMES DAILY. PLEASE MAKE AN APPT FOR MORE REFILLS 90 tablet 3  . omeprazole (PRILOSEC) 20 MG capsule Take 20 mg by mouth  at bedtime.     . polyethylene glycol (MIRALAX / GLYCOLAX) packet Take 17 g by mouth daily.     No current facility-administered medications for this visit.      REVIEW OF SYSTEMS: On review of systems, the patient reports that she is doing well overall. She denies any chest pain, shortness of breath, cough, fevers, chills, night sweats, unintended weight changes. She denies any bowel or bladder disturbances, and denies abdominal pain, nausea or vomiting. She denies any new musculoskeletal or joint aches or pains. A complete review of systems is obtained and is otherwise negative.     PHYSICAL EXAM:  Wt Readings from Last 3 Encounters:  12/01/17 133 lb (60.3 kg)  11/18/16 136 lb 9.6 oz (62 kg)  07/05/16 143 lb (64.9 kg)   Temp Readings from Last 3 Encounters:  07/11/16 98.6 F (37 C) (Oral)  07/07/16 98.2 F (36.8 C) (Oral)  04/12/15 98 F (36.7 C) (Oral)   BP Readings from Last 3 Encounters:  12/01/17 118/74  11/18/16 130/84  07/11/16 126/82   Pulse Readings from Last 3 Encounters:  12/01/17 82  11/18/16 78  07/11/16 84   In general this is a well appearing caucasian female in no acute distress. She's alert and oriented x4 and appropriate throughout the examination. Cardiopulmonary assessment is negative for acute distress and she exhibits normal effort. Breast exam is deferred.   ECOG = 0  0 - Asymptomatic (Fully active, able to carry on all predisease activities without restriction)  1 - Symptomatic but completely ambulatory (Restricted in physically strenuous activity but ambulatory and able to carry out work of a light or sedentary nature. For example, light housework, office work)  2 - Symptomatic, <50% in bed during the day (Ambulatory and capable of all self care but unable to carry out any work activities. Up and about more than 50% of waking hours)  3 - Symptomatic, >50% in bed, but not bedbound (Capable of only limited self-care, confined to bed or chair 50%  or more of waking hours)  4 - Bedbound (Completely disabled. Cannot carry on any self-care. Totally confined to bed or chair)  5 - Death   Eustace Pen MM, Creech RH, Tormey DC, et al. [phone]). "Toxicity and response criteria of the [facility]". [place] Oncol. 5 (6): 649-55    LABORATORY DATA:  Lab Results  Component Value Date   WBC 7.2 07/15/2016   HGB 10.9 (L) 07/15/2016   HCT 34.5 (L) 07/15/2016   MCV 99.1 07/15/2016   PLT 354 07/15/2016   Lab Results  Component Value Date   NA 139 12/13/2017   K 4.3 12/13/2017   CL 103 12/13/2017   CO2 27 12/13/2017   Lab Results  Component Value Date   ALT  18 07/03/2016   AST 33 07/03/2016   ALKPHOS 53 07/03/2016   BILITOT 1.1 07/03/2016      RADIOGRAPHY: No results found.     IMPRESSION/PLAN: 1. Stage IA [id number] grade 2, ER/PR positive invasive ductal carcinoma of the right breast. Dr. Lisbeth Renshaw discusses the pathology findings and reviews the nature of invasive right breast disease. The consensus from the breast conference includes breast conservation with lumpectomy without need for sentinel node biopsy. She would benefit from adjuvant antiestrogen therapy, and provided that she took this medication, she would not need radiotherapy. We briefly discussed the risks, benefits, short, and long term effects of radiotherapy. We will be happy to see her back as needed, and could consider additional discussion as needed regarding her final pathology. 2. In Situ Pacemaker. If the patient was offered radiotherapy, we would coordinate with cardiology for guidance of treatment parameters regarding her device.  The above documentation reflects my direct findings during this shared patient visit. Please see the separate note by Dr. Lisbeth Renshaw on this date for the remainder of the patient's plan of care.    Carola Rhine, PAC

## 2018-06-20 NOTE — Progress Notes (Signed)
[facility] NOTE  Patient Care Team: Monico Blitz, MD as PCP - General (Internal Medicine) Evans Lance, MD as Consulting Physician (Cardiology) Rolm Bookbinder, MD as Consulting Physician (General Surgery) Nicholas Lose, MD as Consulting Physician (Hematology and Oncology) Kyung Rudd, MD as Consulting Physician (Radiation Oncology)  CHIEF COMPLAINTS/PURPOSE OF CONSULTATION:  Newly diagnosed breast cancer  HISTORY OF PRESENTING ILLNESS:  Jessica Myers 84 y.o. female is here because of recent diagnosis of right breast cancer.  Patient had a routine screening mammogram that detected an asymmetry in the right breast at 12 o'clock position 1 measuring 0.3 cm.  Biopsy of it revealed grade 1-2 invasive ductal carcinoma ER 100%, PR 80%, HER-2 negative with a Ki-67 of 2%.  She was presented this morning to the multidisciplinary tumor board and she is here today to discuss her treatment plan  I reviewed her records extensively and collaborated the history with the patient.  SUMMARY OF ONCOLOGIC HISTORY:   Malignant neoplasm of upper-outer quadrant of right breast in female, estrogen receptor positive ([place])   06/11/2018 Initial Diagnosis    Screening detected right breast asymmetry posteriorly measuring 0.3 cm at 12 o'clock position.  Biopsy revealed grade 1-2 invasive ductal carcinoma ER 100%, PR 80%, Ki-67 2%, HER-2 negative, T1 a N0 stage Ia     MEDICAL HISTORY:  Past Medical History:  Diagnosis Date  . A-fib ([place])   . Anxiety   . Bradycardia    s/p PPM October 2014  . Chronic anticoagulation   . Headache(784.0)   . High cholesterol   . Hypertension   . Malignant neoplasm of upper-outer quadrant of right breast in female, estrogen receptor positive ([place]) 06/15/2018  . Multiple rib fractures 07/01/2016   fell down stairs   . Pulmonary contusion 06/2016   from a fall   . Stroke [facility]) September 2014   thrombolytic therapy  . Thyroid disease   . Vertical  vertigo     SURGICAL HISTORY: Past Surgical History:  Procedure Laterality Date  . CHOLECYSTECTOMY    . PACEMAKER INSERTION    . PERMANENT PACEMAKER INSERTION N/A 07/25/2013   Procedure: PERMANENT PACEMAKER INSERTION;  Surgeon: Deboraha Sprang, MD;  Location: [facility] CATH LAB;  Service: Cardiovascular;  Laterality: N/A;  . TEE WITHOUT CARDIOVERSION N/A 07/16/2013   Procedure: TRANSESOPHAGEAL ECHOCARDIOGRAM (TEE);  Surgeon: Thayer Headings, MD;  Location[facility] ENDOSCOPY;  Service: Cardiovascular;  Laterality: N/A;    SOCIAL HISTORY: Social History   Socioeconomic History  . Marital status: Married    Spouse name: leroy  . Number of children: 2  . Years of education: 39  . Highest education level: Not on file  Occupational History  . Occupation: retired  Scientific laboratory technician  . Financial resource strain: Not on file  . Food insecurity:    Worry: Not on file    Inability: Not on file  . Transportation needs:    Medical: Not on file    Non-medical: Not on file  Tobacco Use  . Smoking status: Never Smoker  . Smokeless tobacco: Never Used  Substance and Sexual Activity  . Alcohol use: No    Alcohol/week: 0.0 standard drinks  . Drug use: No  . Sexual activity: Never  Lifestyle  . Physical activity:    Days per week: Not on file    Minutes per session: Not on file  . Stress: Not on file  Relationships  . Social connections:    Talks on phone: Not on  file    Gets together: Not on file    Attends religious service: Not on file    Active member of club or organization: Not on file    Attends meetings of clubs or organizations: Not on file    Relationship status: Not on file  . Intimate partner violence:    Fear of current or ex partner: Not on file    Emotionally abused: Not on file    Physically abused: Not on file    Forced sexual activity: Not on file  Other Topics Concern  . Not on file  Social History Narrative   Patient lives at home with her husband.    FAMILY  HISTORY: Family History  Problem Relation Age of Onset  . Cancer Mother   . Breast cancer Mother   . Cancer Father   . Lung cancer Father     ALLERGIES:  is allergic to biaxin [clarithromycin]; penicillins; shellfish allergy; sulfa antibiotics; and celebrex [celecoxib].  MEDICATIONS:  Current Outpatient Medications  Medication Sig Dispense Refill  . acetaminophen (TYLENOL) 500 MG tablet Take 500-1,000 mg by mouth every 6 (six) hours as needed for mild pain.    Marland Kitchen atorvastatin (LIPITOR) 10 MG tablet Take 10 mg by mouth daily.    . cholecalciferol (VITAMIN D) 1000 UNITS tablet Take 1,000 Units by mouth daily.     Marland Kitchen ELIQUIS 5 MG TABS tablet TAKE 1 TABLET BY MOUTH TWICE A DAY 180 tablet 2  . flecainide (TAMBOCOR) 150 MG tablet TAKE 1/2 TABLETS (75 MG TOTAL) BY MOUTH 2 (TWO) TIMES DAILY. PLEASE KEEP UPCOMING APPT IN FEBRUARY 90 tablet 3  . Levothyroxine Sodium 100 MCG CAPS Take 112 mcg by mouth daily before breakfast.     . lisinopril (PRINIVIL,ZESTRIL) 10 MG tablet Take 10 mg by mouth 2 (two) times daily.    Marland Kitchen loratadine (CLARITIN) 10 MG tablet TAKE 1 TABLET (10 MG TOTAL) BY MOUTH DAILY. 30 tablet 2  . meclizine (ANTIVERT) 25 MG tablet Take 25 mg by mouth 3 (three) times daily as needed for dizziness.    . metoprolol succinate (TOPROL-XL) 25 MG 24 hr tablet Take 1 tablet (25 mg total) by mouth 3 (three) times daily. Please make an appt for more refills 1st attempt 270 tablet 3  . omeprazole (PRILOSEC) 20 MG capsule Take 20 mg by mouth at bedtime.     . polyethylene glycol (MIRALAX / GLYCOLAX) packet Take 17 g by mouth daily.     No current facility-administered medications for this visit.     REVIEW OF SYSTEMS:   Constitutional: Denies fevers, chills or abnormal night sweats Eyes: Denies blurriness of vision, double vision or watery eyes Ears, nose, mouth, throat, and face: Denies mucositis or sore throat Respiratory: Denies cough, dyspnea or wheezes Cardiovascular: Denies palpitation,  chest discomfort or lower extremity swelling Gastrointestinal:  Denies nausea, heartburn or change in bowel habits Skin: Denies abnormal skin rashes Lymphatics: Denies new lymphadenopathy or easy bruising Neurological:Denies numbness, tingling or new weaknesses Behavioral/Psych: Mood is stable, no new changes  Breast:  Denies any palpable lumps or discharge All other systems were reviewed with the patient and are negative.  PHYSICAL EXAMINATION: ECOG PERFORMANCE STATUS: 1 - Symptomatic but completely ambulatory  Vitals:   06/20/18 1256  BP: (!) 143/80  Pulse: 88  Resp: 16  Temp: 97.9 F (36.6 C)  SpO2: 100%   Filed Weights   06/20/18 1256  Weight: 135 lb 3.2 oz (61.3 kg)    GENERAL:alert, no  distress and comfortable SKIN: skin color, texture, turgor are normal, no rashes or significant lesions EYES: normal, conjunctiva are pink and non-injected, sclera clear OROPHARYNX:no exudate, no erythema and lips, buccal mucosa, and tongue normal  NECK: supple, thyroid normal size, non-tender, without nodularity LYMPH:  no palpable lymphadenopathy in the cervical, axillary or inguinal LUNGS: clear to auscultation and percussion with normal breathing effort HEART: regular rate & rhythm and no murmurs and no lower extremity edema ABDOMEN:abdomen soft, non-tender and normal bowel sounds Musculoskeletal:no cyanosis of digits and no clubbing  PSYCH: alert & oriented x 3 with fluent speech NEURO: no focal motor/sensory deficits BREAST: No palpable nodules in breast. No palpable axillary or supraclavicular lymphadenopathy (exam performed in the presence of a chaperone)   LABORATORY DATA:  I have reviewed the data as listed Lab Results  Component Value Date   WBC 4.6 06/20/2018   HGB 13.0 06/20/2018   HCT 39.9 06/20/2018   MCV 93.5 06/20/2018   PLT 178 06/20/2018   Lab Results  Component Value Date   NA 141 06/20/2018   K 4.9 06/20/2018   CL 106 06/20/2018   CO2 28 06/20/2018     RADIOGRAPHIC STUDIES: I have personally reviewed the radiological reports and agreed with the findings in the report.  ASSESSMENT AND PLAN:  Malignant neoplasm of upper-outer quadrant of right breast in female, estrogen receptor positive ([place]) 06/11/2018:Screening detected right breast asymmetry posteriorly measuring 0.3 cm at 12 o'clock position.  Biopsy revealed grade 1-2 invasive ductal carcinoma ER 100%, PR 80%, Ki-67 2%, HER-2 negative, T1 a N0 stage Ia  Pathology and radiology counseling: Discussed with the patient, the details of pathology including the type of breast cancer,the clinical staging, the significance of ER, PR and HER-2/neu receptors and the implications for treatment. After reviewing the pathology in detail, we proceeded to discuss the different treatment options between surgery, radiation, and antiestrogen therapies.  Recommendation: 1.  Breast conserving surgery 2. followed by optional antiestrogen therapy with letrozole 2.5 mg daily x5 years  Return to clinic after surgery to discuss the pros and cons of antiestrogen therapy.    All questions were answered. The patient knows to call the clinic with any problems, questions or concerns.    Harriette Ohara, MD 06/20/18

## 2018-06-20 NOTE — Progress Notes (Signed)
Nutrition Assessment  Reason for Assessment:  Pt seen in Breast Clinic  ASSESSMENT:   84 year old female with new diagnosis of breast cancer.  Past medical history of afib, stroke, pacemaker.  Patient reports normal appetite  Medications:  reviewed  Labs: reviewed  Anthropometrics:   Height: 64 inches Weight: 135 lb BMI: 23.2   NUTRITION DIAGNOSIS: Food and nutrition related knowledge deficit related to new diagnosis of breast cancer as evidenced by no prior need for nutrition related information.  INTERVENTION:   Discussed and provided packet of information regarding nutritional tips for breast cancer patients.  Questions answered.  Teachback method used.  Contact information provided and patient knows to contact me with questions/concerns.    MONITORING, EVALUATION, and GOAL: Pt will consume a healthy plant based diet to maintain lean body mass throughout treatment.   Jari Carollo B. Zenia Resides, [place], [place] Registered Dietitian [phone] (pager)

## 2018-06-20 NOTE — Assessment & Plan Note (Signed)
06/11/2018:Screening detected right breast asymmetry posteriorly measuring 0.3 cm at 12 o'clock position.  Biopsy revealed grade 1-2 invasive ductal carcinoma ER 100%, PR 80%, Ki-67 2%, HER-2 negative, T1 a N0 stage Ia  Pathology and radiology counseling: Discussed with the patient, the details of pathology including the type of breast cancer,the clinical staging, the significance of ER, PR and HER-2/neu receptors and the implications for treatment. After reviewing the pathology in detail, we proceeded to discuss the different treatment options between surgery, radiation, and antiestrogen therapies.  Recommendation: 1.  Breast conserving surgery 2. followed by optional antiestrogen therapy with letrozole 2.5 mg daily x5 years  Return to clinic after surgery to discuss the pros and cons of antiestrogen therapy.

## 2018-06-20 NOTE — Addendum Note (Signed)
Encounter addended by: Hayden Pedro, PA-C on: 06/20/2018 2:02 PM  Actions taken: LOS modified

## 2018-06-21 ENCOUNTER — Encounter: Payer: Self-pay | Admitting: General Practice

## 2018-06-21 ENCOUNTER — Other Ambulatory Visit: Payer: Self-pay | Admitting: General Surgery

## 2018-06-21 DIAGNOSIS — C50411 Malignant neoplasm of upper-outer quadrant of right female breast: Secondary | ICD-10-CM

## 2018-06-21 NOTE — Progress Notes (Signed)
[place] Psychosocial Distress Screening Spiritual Care  Met with Jessica Myers and husband Jessica Myers in [facility] to introduce [facility] team/resources, reviewing distress screen per protocol.  The patient scored a [unspecified] on the Psychosocial Distress Thermometer which indicates [unspecified] distress. Also assessed for distress and other psychosocial needs.   ONCBCN DISTRESS SCREENING 06/21/2018  Screening Type Initial Screening  Information Concerns Type Lack of info about diagnosis;Lack of info about treatment;Lack of info about maintaining fitness  Referral to support programs Yes    [name] and Jessica Myers have been married 17 years. For enjoyment, he makes dulcimers, and she plays--including with a group that visits nursing homes twice a month. She has written two books, autobiography and fiction. Couple reports low distress and verbalizes appreciation for variety of programs offered through [employer].  Follow up needed: No. Per pt, no other needs at this time. They have full packet of [place] team/programming information and know to contact Team anytime, but please also page if needs arise or circumstances change. Thank you.   [place], [place], [facility] Pager [phone] Voicemail [phone]

## 2018-06-26 ENCOUNTER — Telehealth: Payer: Self-pay | Admitting: Internal Medicine

## 2018-06-26 NOTE — Telephone Encounter (Signed)
New Message   Patient is calling because she was just diagnosis with cancer. She is wanting to speak with Dr. Lovena Le and get his input about what she is going to have to endure due to her pacemaker and her overall heart issue. Please call to discuss.

## 2018-06-26 NOTE — Telephone Encounter (Signed)
Pt called to report that she is having a lumpectomy secondary to cancer with Dr. Donne Hazel.. It is not scheduled yet. The pt is having a lot of anxiety about her surgery in regards to her pacemaker and her history of CVA which she says was a very long time ago but it still worries her so much. She has spoken to Dr. Donne Hazel about it and he said he will put a magnet on her pacemaker during surgery. She still has many concerns and wants Dr. Lovena Le to know what is going on a would feel better if he acknowledged that he is okay with the surgery. I advised her that she should speak with Dr. Donne Hazel further about her concerns and if needed he will request surgery clearance prior to scheduling. I advised her to ask him about what kind of anesthesia he will be using and knowing more details may make her feel more at ease. Pt agrees and will call Dr. Donne Hazel today.. and I told her that I will forward everything to Dr. Lovena Le and his nurse, Anderson Malta.. we will let her know if Dr.Taylor has any further recommendations and advice going forward. Pt verbalized understanding.

## 2018-06-27 NOTE — Telephone Encounter (Signed)
She may proceed with surgery and she may stop her blood thinner 2-3 days prior to surgery. Dr. Donne Hazel will let her know when he wants her blood thinner restarted. GT

## 2018-06-28 ENCOUNTER — Telehealth: Payer: Self-pay | Admitting: *Deleted

## 2018-06-28 NOTE — Telephone Encounter (Signed)
Spoke to pr concerning Trainer from 9.4.19. Denies questions or concerns regarding dx or treatment care plan. Encourage pt to call with needs. Received verbal understanding. Scheduled and confirmed post-op with DR. Lindi Adie on 10.7.19 at 10:15

## 2018-06-28 NOTE — Telephone Encounter (Signed)
Pt advised Dr. Tanna Furry recommendations.. She has already been advised to hold her meds for her surgery scheduled for 07/13/18.

## 2018-07-06 ENCOUNTER — Encounter (HOSPITAL_COMMUNITY): Payer: Self-pay

## 2018-07-06 ENCOUNTER — Encounter (HOSPITAL_COMMUNITY)
Admission: RE | Admit: 2018-07-06 | Discharge: 2018-07-06 | Disposition: A | Discharge status: Home / Self Care | Payer: PPO | Source: Ambulatory Visit | Attending: General Surgery | Admitting: General Surgery

## 2018-07-06 DIAGNOSIS — R9431 Abnormal electrocardiogram [ECG] [EKG]: Secondary | ICD-10-CM | POA: Diagnosis not present

## 2018-07-06 DIAGNOSIS — I10 Essential (primary) hypertension: Secondary | ICD-10-CM | POA: Insufficient documentation

## 2018-07-06 DIAGNOSIS — Z950 Presence of cardiac pacemaker: Secondary | ICD-10-CM | POA: Insufficient documentation

## 2018-07-06 DIAGNOSIS — Z01818 Encounter for other preprocedural examination: Secondary | ICD-10-CM | POA: Insufficient documentation

## 2018-07-06 HISTORY — DX: Cardiac arrhythmia, unspecified: I49.9

## 2018-07-06 LAB — BASIC METABOLIC PANEL
Anion gap: 7 (ref 5–15)
BUN: 25 mg/dL — AB (ref 8–23)
CALCIUM: 9.3 mg/dL (ref 8.9–10.3)
CO2: 27 mmol/L (ref 22–32)
CREATININE: 1.48 mg/dL — AB (ref 0.44–1.00)
Chloride: 105 mmol/L (ref 98–111)
GFR calc non Af Amer: 31 mL/min — ABNORMAL LOW (ref >60)
GFR, EST AFRICAN AMERICAN: 35 mL/min — AB (ref >60)
Glucose, Bld: 98 mg/dL (ref 70–99)
Potassium: 4.7 mmol/L (ref 3.5–5.1)
SODIUM: 139 mmol/L (ref 135–145)

## 2018-07-06 LAB — CBC
HEMATOCRIT: 38.7 % (ref 36.0–46.0)
Hemoglobin: 12.3 g/dL (ref 12.0–15.0)
MCH: 31.4 pg (ref 26.0–34.0)
MCHC: 31.8 g/dL (ref 30.0–36.0)
MCV: 98.7 fL (ref 78.0–100.0)
Platelets: 170 10*3/uL (ref 150–400)
RBC: 3.92 MIL/uL (ref 3.87–5.11)
RDW: 12.2 % (ref 11.5–15.5)
WBC: 5.9 10*3/uL (ref 4.0–10.5)

## 2018-07-06 LAB — PROTIME-INR
INR: 1.37
Prothrombin Time: 16.8 seconds — ABNORMAL HIGH (ref 11.4–15.2)

## 2018-07-06 MED ORDER — CHLORHEXIDINE GLUCONATE CLOTH 2 % EX PADS: 6.0000 | MEDICATED_PAD | Freq: Once | CUTANEOUS | Status: DC

## 2018-07-06 NOTE — Pre-Procedure Instructions (Signed)
Jessica Myers  07/06/2018      [id number] - [place], [place] [address] Phone: [phone] Fax: [phone]  [id number] - [place], [place] [address] AT [facility] [place] [address] Phone: [phone] Fax: [phone]    Your procedure is scheduled on 07/13/18.  Report to [facility] Admitting at 630 A.M.  Call this number if you have problems the morning of surgery:  [phone]   Remember:  Do not eat or drink after midnight.     Take these medicines the morning of surgery with A SIP OF WATER --tylenol,synthroid,metoprolol,prilosec    Do not wear jewelry, make-up or nail polish.  Do not wear lotions, powders, or perfumes, or deodorant.  Do not shave 48 hours prior to surgery.  Men may shave face and neck.  Do not bring valuables to the hospital.  [facility] is not responsible for any belongings or valuables.  Contacts, dentures or bridgework may not be worn into surgery.  Leave your suitcase in the car.  After surgery it may be brought to your room.  For patients admitted to the hospital, discharge time will be determined by your treatment team.  Patients discharged the day of surgery will not be allowed to drive home.   Name and phone number of your driver:   Do not take any aspirin,anti-inflammatories,vitamins,or herbal supplements 5-7 days prior to surgery.Please complete your PRE-SURGERY ENSURE that was given to before you leave your house the morning of surgery.  Please, if able, drink it in one setting. DO NOT SIP.[place] - Preparing for Surgery  Before surgery, you can play an important role.  Because skin is not sterile, your skin needs to be as free of germs as possible.  You can reduce the number of germs on you skin by washing with CHG (chlorahexidine gluconate) soap before surgery.  CHG is an antiseptic cleaner which kills germs and bonds with the skin to continue killing  germs even after washing.  Oral Hygiene is also important in reducing the risk of infection.  Remember to brush your teeth with your regular toothpaste the morning of surgery.  Please DO NOT use if you have an allergy to CHG or antibacterial soaps.  If your skin becomes reddened/irritated stop using the CHG and inform your nurse when you arrive at Short Stay.  Do not shave (including legs and underarms) for at least 48 hours prior to the first CHG shower.  You may shave your face.  Please follow these instructions carefully:   1.  Shower with CHG Soap the night before surgery and the morning of Surgery.  2.  If you choose to wash your hair, wash your hair first as usual with your normal shampoo.  3.  After you shampoo, rinse your hair and body thoroughly to remove the shampoo. 4.  Use CHG as you would any other liquid soap.  You can apply chg directly to the skin and wash gently with a      scrungie or washcloth.           5.  Apply the CHG Soap to your body ONLY FROM THE NECK DOWN.   Do not use on open wounds or open sores. Avoid contact with your eyes, ears, mouth and genitals (private parts).  Wash genitals (private parts) with your normal soap.  6.  Wash thoroughly, paying special attention to the area where your  surgery will be performed.  7.  Thoroughly rinse your body with warm water from the neck down.  8.  DO NOT shower/wash with your normal soap after using and rinsing off the CHG Soap.  9.  Pat yourself dry with a clean towel.            10.  Wear clean pajamas.            11.  Place clean sheets on your bed the night of your first shower and do not sleep with pets.  Day of Surgery  Do not apply any lotions/deoderants the morning of surgery.   Please wear clean clothes to the hospital/surgery center. Remember to brush your teeth with toothpaste.    Please read over the following fact sheets that you were given.

## 2018-07-09 NOTE — Progress Notes (Signed)
Anesthesia Chart Review:  Case:  [id number] Date/Time:  07/13/18 0815   Procedure:  BREAST LUMPECTOMY WITH RADIOACTIVE SEED LOCALIZATION (Right Breast)   Anesthesia type:  Choice   Pre-op diagnosis:  RIGHT BREAST CANCER   Location:  [place] OR ROOM 07 / [facility]   Surgeon:  Rolm Bookbinder, MD      DISCUSSION: 84 yo female never smoker for above procedure. Pertinent hx incldues HTN, Hypothyroid, Anxiety, Stroke (2014), Afib, Bradycardia (s/p PPM 2014), Vertical vertigo, Renal insufficiency.   Cardiac clearance from Dr. Cristopher Peru in telephone encounter 06/27/2018 states "She may proceed with surgery and she may stop her blood thinner 2-3 days prior to surgery. Dr. Donne Hazel will let her know when he wants her blood thinner restarted".   Device form has been faxed.  Anticipate she can proceed as planned barring acute status change.  VS: BP (!) 144/82   Pulse 98   Temp 36.4 C   Resp 18   Ht 5\' 4"  (1.626 m)   Wt 61.5 kg   SpO2 98%   BMI 23.26 kg/m    PROVIDERS: Monico Blitz, MD  Is PCP  LABS: Labs reviewed: Acceptable for surgery. Mildly elevated creatinine. Review of history shows baseline ~1.3. Results for Jessica, Myers (MRN [id number]) as of 07/09/2018 10:36  Ref. Range 07/02/2016 14:38 07/03/2016 03:50 07/15/2016 12:05 12/13/2017 14:07 06/20/2018 12:12 07/06/2018 15:00  Creatinine Latest Ref Range: 0.44 - 1.00 mg/dL 1.35 (H) 1.24 (H) 1.26 (H) 1.24 (H) 1.34 (H) 1.48 (H)   (all labs ordered are listed, but only abnormal results are displayed)  Labs Reviewed  BASIC METABOLIC PANEL - Abnormal; Notable for the following components:      Result Value   BUN 25 (*)    Creatinine, Ser 1.48 (*)    GFR calc non Af Amer 31 (*)    GFR calc Af Amer 35 (*)    All other components within normal limits  PROTIME-INR - Abnormal; Notable for the following components:   Prothrombin Time 16.8 (*)    All other components within normal limits  CBC     IMAGES: CHEST  2 VIEW  COMPARISON:  CT  07/02/2016  FINDINGS: Normal cardiac silhouette. LEFT-sided pacer. No pulmonary contusion or pleural fluid. No pneumothorax appreciated.  On lateral projection, compression fracture at at T4 with approximately 50% loss vertebral body height and T7 with approximately 30% loss of vertebral height.  IMPRESSION: 1. No evidence pneumothorax. 2. Compression fractures at T4 and T7, age-indeterminate.   EKG: 07/06/2018: Atrial-paced rhythm with prolonged AV conduction  CV: TEE 07/16/2013: Study Conclusions  - Left ventricle: Systolic function was normal. The estimated ejection fraction was in the range of 55% to 60%. - Aortic valve: No evidence of vegetation. Mild to moderate regurgitation. - Mitral valve: No evidence of vegetation. Mild to moderate regurgitation. - Left atrium: No evidence of thrombus in the atrial cavity or appendage. No evidence of thrombus in the atrial cavity or appendage. - Pulmonic valve: No evidence of vegetation.  Past Medical History:  Diagnosis Date  . A-fib ([place])   . Anxiety   . Bradycardia    s/p PPM October 2014  . Chronic anticoagulation   . Dysrhythmia   . Headache(784.0)   . High cholesterol   . Hypertension   . Malignant neoplasm of upper-outer quadrant of right breast in female, estrogen receptor positive ([place]) 06/15/2018  . Multiple rib fractures 07/01/2016   fell down stairs   . Pulmonary contusion 06/2016  from a fall   . Stroke [facility]) September 2014   thrombolytic therapy  . Thyroid disease   . Vertical vertigo     Past Surgical History:  Procedure Laterality Date  . CHOLECYSTECTOMY    . INSERT / REPLACE / REMOVE PACEMAKER    . PACEMAKER INSERTION    . PERMANENT PACEMAKER INSERTION N/A 07/25/2013   Procedure: PERMANENT PACEMAKER INSERTION;  Surgeon: Deboraha Sprang, MD;  Location: [facility] CATH LAB;  Service: Cardiovascular;  Laterality: N/A;  . TEE WITHOUT CARDIOVERSION N/A 07/16/2013   Procedure: TRANSESOPHAGEAL  ECHOCARDIOGRAM (TEE);  Surgeon: Thayer Headings, MD;  Location: [facility] ENDOSCOPY;  Service: Cardiovascular;  Laterality: N/A;    MEDICATIONS: . acetaminophen (TYLENOL) 500 MG tablet  . atorvastatin (LIPITOR) 10 MG tablet  . carbamide peroxide ([facility] EAR DROPS) 6.5 % OTIC solution  . cholecalciferol (VITAMIN D) 1000 UNITS tablet  . ELIQUIS 5 MG TABS tablet  . flecainide (TAMBOCOR) 150 MG tablet  . levothyroxine (SYNTHROID, LEVOTHROID) 112 MCG tablet  . lisinopril (PRINIVIL,ZESTRIL) 10 MG tablet  . meclizine (ANTIVERT) 25 MG tablet  . metoprolol succinate (TOPROL-XL) 25 MG 24 hr tablet  . omeprazole (PRILOSEC) 20 MG capsule  . Polyethyl Glycol-Propyl Glycol (SYSTANE) 0.4-0.3 % SOLN  . polyethylene glycol (MIRALAX / GLYCOLAX) packet  . simethicone (MYLICON) 301 MG chewable tablet   No current facility-administered medications for this encounter.      Wynonia Musty [facility] Short Stay Center/Anesthesiology Phone [phone] 07/09/2018 10:43 AM

## 2018-07-10 DIAGNOSIS — C50911 Malignant neoplasm of unspecified site of right female breast: Secondary | ICD-10-CM | POA: Diagnosis not present

## 2018-07-13 ENCOUNTER — Encounter (HOSPITAL_COMMUNITY): Payer: Self-pay

## 2018-07-13 ENCOUNTER — Ambulatory Visit (HOSPITAL_COMMUNITY): Payer: PPO | Admitting: Physician Assistant

## 2018-07-13 ENCOUNTER — Observation Stay (HOSPITAL_COMMUNITY)
Admission: RE | Admit: 2018-07-13 | Discharge: 2018-07-14 | Disposition: A | Discharge status: Home / Self Care | Payer: PPO | Source: Ambulatory Visit | Attending: General Surgery | Admitting: General Surgery

## 2018-07-13 ENCOUNTER — Ambulatory Visit (HOSPITAL_COMMUNITY): Payer: PPO | Admitting: Anesthesiology

## 2018-07-13 ENCOUNTER — Other Ambulatory Visit: Payer: Self-pay

## 2018-07-13 ENCOUNTER — Encounter (HOSPITAL_COMMUNITY)
Admission: RE | Disposition: A | Discharge status: Home / Self Care | Payer: Self-pay | Source: Ambulatory Visit | Attending: General Surgery

## 2018-07-13 DIAGNOSIS — Z888 Allergy status to other drugs, medicaments and biological substances status: Secondary | ICD-10-CM | POA: Diagnosis not present

## 2018-07-13 DIAGNOSIS — E7800 Pure hypercholesterolemia, unspecified: Secondary | ICD-10-CM | POA: Diagnosis not present

## 2018-07-13 DIAGNOSIS — Z8673 Personal history of transient ischemic attack (TIA), and cerebral infarction without residual deficits: Secondary | ICD-10-CM | POA: Diagnosis not present

## 2018-07-13 DIAGNOSIS — C50411 Malignant neoplasm of upper-outer quadrant of right female breast: Principal | ICD-10-CM | POA: Insufficient documentation

## 2018-07-13 DIAGNOSIS — I10 Essential (primary) hypertension: Secondary | ICD-10-CM | POA: Diagnosis not present

## 2018-07-13 DIAGNOSIS — Z882 Allergy status to sulfonamides status: Secondary | ICD-10-CM | POA: Diagnosis not present

## 2018-07-13 DIAGNOSIS — I252 Old myocardial infarction: Secondary | ICD-10-CM | POA: Insufficient documentation

## 2018-07-13 DIAGNOSIS — E039 Hypothyroidism, unspecified: Secondary | ICD-10-CM | POA: Diagnosis not present

## 2018-07-13 DIAGNOSIS — Z79899 Other long term (current) drug therapy: Secondary | ICD-10-CM | POA: Insufficient documentation

## 2018-07-13 DIAGNOSIS — Z880 Allergy status to penicillin: Secondary | ICD-10-CM | POA: Insufficient documentation

## 2018-07-13 DIAGNOSIS — Z7901 Long term (current) use of anticoagulants: Secondary | ICD-10-CM | POA: Diagnosis not present

## 2018-07-13 DIAGNOSIS — C50911 Malignant neoplasm of unspecified site of right female breast: Secondary | ICD-10-CM | POA: Diagnosis present

## 2018-07-13 DIAGNOSIS — Z91013 Allergy to seafood: Secondary | ICD-10-CM | POA: Insufficient documentation

## 2018-07-13 DIAGNOSIS — Z950 Presence of cardiac pacemaker: Secondary | ICD-10-CM | POA: Diagnosis not present

## 2018-07-13 DIAGNOSIS — F419 Anxiety disorder, unspecified: Secondary | ICD-10-CM | POA: Insufficient documentation

## 2018-07-13 DIAGNOSIS — Z85820 Personal history of malignant melanoma of skin: Secondary | ICD-10-CM | POA: Insufficient documentation

## 2018-07-13 DIAGNOSIS — Z170 Estrogen receptor positive status [ER+]: Secondary | ICD-10-CM | POA: Insufficient documentation

## 2018-07-13 DIAGNOSIS — I080 Rheumatic disorders of both mitral and aortic valves: Secondary | ICD-10-CM | POA: Insufficient documentation

## 2018-07-13 DIAGNOSIS — I4891 Unspecified atrial fibrillation: Secondary | ICD-10-CM | POA: Diagnosis not present

## 2018-07-13 DIAGNOSIS — Z886 Allergy status to analgesic agent status: Secondary | ICD-10-CM | POA: Diagnosis not present

## 2018-07-13 HISTORY — PX: BREAST LUMPECTOMY WITH RADIOACTIVE SEED LOCALIZATION: SHX6424

## 2018-07-13 LAB — PROTIME-INR
INR: 1.08
Prothrombin Time: 13.9 seconds (ref 11.4–15.2)

## 2018-07-13 SURGERY — BREAST LUMPECTOMY WITH RADIOACTIVE SEED LOCALIZATION
Anesthesia: General | Site: Breast | Laterality: Right

## 2018-07-13 MED ORDER — MECLIZINE HCL 25 MG PO TABS
25.0000 mg | ORAL_TABLET | Freq: Three times a day (TID) | ORAL | Status: DC | PRN
  Administered 2018-07-14: 25 mg via ORAL
  Filled 2018-07-13 (×2): qty 1

## 2018-07-13 MED ORDER — FLECAINIDE ACETATE 50 MG PO TABS
75.0000 mg | ORAL_TABLET | Freq: Two times a day (BID) | ORAL | Status: DC
  Administered 2018-07-13 – 2018-07-14 (×2): 75 mg via ORAL
  Filled 2018-07-13 (×2): qty 2

## 2018-07-13 MED ORDER — DEXAMETHASONE SODIUM PHOSPHATE 10 MG/ML IJ SOLN
INTRAMUSCULAR | Status: DC | PRN
  Administered 2018-07-13: 10 mg via INTRAVENOUS

## 2018-07-13 MED ORDER — BUPIVACAINE-EPINEPHRINE 0.25% -1:200000 IJ SOLN
INTRAMUSCULAR | Status: DC | PRN
  Administered 2018-07-13: 17 mL

## 2018-07-13 MED ORDER — LEVOTHYROXINE SODIUM 112 MCG PO TABS
112.0000 ug | ORAL_TABLET | Freq: Every day | ORAL | Status: DC
  Administered 2018-07-14: 112 ug via ORAL
  Filled 2018-07-13: qty 1

## 2018-07-13 MED ORDER — 0.9 % SODIUM CHLORIDE (POUR BTL) OPTIME
TOPICAL | Status: DC | PRN
  Administered 2018-07-13: 1000 mL

## 2018-07-13 MED ORDER — ONDANSETRON HCL 4 MG/2ML IJ SOLN
INTRAMUSCULAR | Status: DC | PRN
  Administered 2018-07-13: 4 mg via INTRAVENOUS

## 2018-07-13 MED ORDER — METOPROLOL SUCCINATE ER 25 MG PO TB24
25.0000 mg | ORAL_TABLET | Freq: Three times a day (TID) | ORAL | Status: DC
  Administered 2018-07-13 – 2018-07-14 (×3): 25 mg via ORAL
  Filled 2018-07-13 (×3): qty 1

## 2018-07-13 MED ORDER — LACTATED RINGERS IV SOLN
INTRAVENOUS | Status: DC | PRN
  Administered 2018-07-13: 08:00:00 via INTRAVENOUS

## 2018-07-13 MED ORDER — FENTANYL CITRATE (PF) 100 MCG/2ML IJ SOLN: 25.0000 ug | INTRAMUSCULAR | Status: DC | PRN

## 2018-07-13 MED ORDER — ACETAMINOPHEN 500 MG PO TABS
1000.0000 mg | ORAL_TABLET | ORAL | Status: DC
  Filled 2018-07-13: qty 2

## 2018-07-13 MED ORDER — SODIUM CHLORIDE 0.9 % IV SOLN
INTRAVENOUS | Status: DC
  Administered 2018-07-13: 1 mL via INTRAVENOUS

## 2018-07-13 MED ORDER — POLYETHYLENE GLYCOL 3350 17 G PO PACK: 17.0000 g | PACK | Freq: Every day | ORAL | Status: DC | PRN

## 2018-07-13 MED ORDER — BUPIVACAINE-EPINEPHRINE (PF) 0.25% -1:200000 IJ SOLN
INTRAMUSCULAR | Status: AC
  Filled 2018-07-13: qty 30

## 2018-07-13 MED ORDER — ONDANSETRON 4 MG PO TBDP: 4.0000 mg | ORAL_TABLET | Freq: Four times a day (QID) | ORAL | Status: DC | PRN

## 2018-07-13 MED ORDER — LISINOPRIL 10 MG PO TABS
10.0000 mg | ORAL_TABLET | Freq: Two times a day (BID) | ORAL | Status: DC
  Administered 2018-07-13: 10 mg via ORAL
  Filled 2018-07-13: qty 1

## 2018-07-13 MED ORDER — FENTANYL CITRATE (PF) 250 MCG/5ML IJ SOLN
INTRAMUSCULAR | Status: AC
  Filled 2018-07-13: qty 5

## 2018-07-13 MED ORDER — CIPROFLOXACIN IN D5W 400 MG/200ML IV SOLN
400.0000 mg | INTRAVENOUS | Status: AC
  Administered 2018-07-13: 400 mg via INTRAVENOUS
  Filled 2018-07-13: qty 200

## 2018-07-13 MED ORDER — ACETAMINOPHEN 500 MG PO TABS
1000.0000 mg | ORAL_TABLET | Freq: Four times a day (QID) | ORAL | Status: DC
  Administered 2018-07-13 – 2018-07-14 (×4): 1000 mg via ORAL
  Filled 2018-07-13 (×4): qty 2

## 2018-07-13 MED ORDER — PROPOFOL 10 MG/ML IV BOLUS
INTRAVENOUS | Status: AC
  Filled 2018-07-13: qty 20

## 2018-07-13 MED ORDER — ONDANSETRON HCL 4 MG/2ML IJ SOLN
INTRAMUSCULAR | Status: AC
  Filled 2018-07-13: qty 2

## 2018-07-13 MED ORDER — ENSURE PRE-SURGERY PO LIQD
296.0000 mL | Freq: Once | ORAL | Status: DC
  Filled 2018-07-13: qty 296

## 2018-07-13 MED ORDER — FENTANYL CITRATE (PF) 250 MCG/5ML IJ SOLN
INTRAMUSCULAR | Status: DC | PRN
  Administered 2018-07-13: 50 ug via INTRAVENOUS

## 2018-07-13 MED ORDER — PANTOPRAZOLE SODIUM 40 MG PO TBEC
40.0000 mg | DELAYED_RELEASE_TABLET | Freq: Every day | ORAL | Status: DC
  Administered 2018-07-14: 40 mg via ORAL
  Filled 2018-07-13: qty 1

## 2018-07-13 MED ORDER — ONDANSETRON HCL 4 MG/2ML IJ SOLN: 4.0000 mg | Freq: Four times a day (QID) | INTRAMUSCULAR | Status: DC | PRN

## 2018-07-13 MED ORDER — PROPOFOL 10 MG/ML IV BOLUS
INTRAVENOUS | Status: DC | PRN
  Administered 2018-07-13: 100 mg via INTRAVENOUS

## 2018-07-13 MED ORDER — SIMETHICONE 80 MG PO CHEW: 80.0000 mg | CHEWABLE_TABLET | Freq: Four times a day (QID) | ORAL | Status: DC | PRN

## 2018-07-13 MED ORDER — PHENYLEPHRINE 40 MCG/ML (10ML) SYRINGE FOR IV PUSH (FOR BLOOD PRESSURE SUPPORT)
PREFILLED_SYRINGE | INTRAVENOUS | Status: DC | PRN
  Administered 2018-07-13: 120 ug via INTRAVENOUS
  Administered 2018-07-13 (×6): 80 ug via INTRAVENOUS

## 2018-07-13 MED ORDER — OXYCODONE HCL 5 MG PO TABS: 5.0000 mg | ORAL_TABLET | Freq: Four times a day (QID) | ORAL | Status: DC | PRN

## 2018-07-13 MED ORDER — PHENYLEPHRINE 40 MCG/ML (10ML) SYRINGE FOR IV PUSH (FOR BLOOD PRESSURE SUPPORT)
PREFILLED_SYRINGE | INTRAVENOUS | Status: AC
  Filled 2018-07-13: qty 20

## 2018-07-13 MED ORDER — DEXAMETHASONE SODIUM PHOSPHATE 10 MG/ML IJ SOLN
INTRAMUSCULAR | Status: AC
  Filled 2018-07-13: qty 1

## 2018-07-13 MED ORDER — LIDOCAINE HCL (CARDIAC) PF 100 MG/5ML IV SOSY
PREFILLED_SYRINGE | INTRAVENOUS | Status: DC | PRN
  Administered 2018-07-13: 60 mg via INTRAVENOUS

## 2018-07-13 MED ORDER — LIDOCAINE 2% (20 MG/ML) 5 ML SYRINGE
INTRAMUSCULAR | Status: AC
  Filled 2018-07-13: qty 5

## 2018-07-13 SURGICAL SUPPLY — 48 items
ADH SKN CLS APL DERMABOND .7 (GAUZE/BANDAGES/DRESSINGS) ×1
APPLIER CLIP 9.375 MED OPEN (MISCELLANEOUS) ×2
APR CLP MED 9.3 20 MLT OPN (MISCELLANEOUS) ×1
BINDER BREAST LRG (GAUZE/BANDAGES/DRESSINGS) ×2 IMPLANT
BINDER BREAST XLRG (GAUZE/BANDAGES/DRESSINGS) IMPLANT
BLADE SURG 15 STRL LF DISP TIS (BLADE) ×1 IMPLANT
BLADE SURG 15 STRL SS (BLADE) ×2
CANISTER SUCT 3000ML PPV (MISCELLANEOUS) ×2 IMPLANT
CHLORAPREP W/TINT 26ML (MISCELLANEOUS) ×2 IMPLANT
CLIP APPLIE 9.375 MED OPEN (MISCELLANEOUS) IMPLANT
COVER PROBE W GEL 5X96 (DRAPES) ×2 IMPLANT
COVER SURGICAL LIGHT HANDLE (MISCELLANEOUS) ×2 IMPLANT
DERMABOND ADVANCED (GAUZE/BANDAGES/DRESSINGS) ×1
DERMABOND ADVANCED .7 DNX12 (GAUZE/BANDAGES/DRESSINGS) ×1 IMPLANT
DEVICE DUBIN SPECIMEN MAMMOGRA (MISCELLANEOUS) ×2 IMPLANT
DRAPE CHEST BREAST 15X10 FENES (DRAPES) ×2 IMPLANT
DRAPE UTILITY XL STRL (DRAPES) ×2 IMPLANT
ELECT COATED BLADE 2.86 ST (ELECTRODE) ×2 IMPLANT
ELECT REM PT RETURN 9FT ADLT (ELECTROSURGICAL) ×2
ELECTRODE REM PT RTRN 9FT ADLT (ELECTROSURGICAL) ×1 IMPLANT
GAUZE SPONGE 4X4 12PLY STRL (GAUZE/BANDAGES/DRESSINGS) ×1 IMPLANT
GLOVE BIO SURGEON STRL SZ7 (GLOVE) ×4 IMPLANT
GLOVE BIOGEL PI IND STRL 7.5 (GLOVE) ×1 IMPLANT
GLOVE BIOGEL PI INDICATOR 7.5 (GLOVE) ×1
GOWN STRL REUS W/ TWL LRG LVL3 (GOWN DISPOSABLE) ×2 IMPLANT
GOWN STRL REUS W/TWL LRG LVL3 (GOWN DISPOSABLE) ×4
ILLUMINATOR WAVEGUIDE N/F (MISCELLANEOUS) ×2 IMPLANT
KIT BASIN OR (CUSTOM PROCEDURE TRAY) ×2 IMPLANT
KIT MARKER MARGIN INK (KITS) ×2 IMPLANT
NDL HYPO 25GX1X1/2 BEV (NEEDLE) ×1 IMPLANT
NEEDLE HYPO 25GX1X1/2 BEV (NEEDLE) ×2 IMPLANT
NS IRRIG 1000ML POUR BTL (IV SOLUTION) ×2 IMPLANT
PACK SURGICAL SETUP 50X90 (CUSTOM PROCEDURE TRAY) ×2 IMPLANT
PENCIL BUTTON HOLSTER BLD 10FT (ELECTRODE) ×2 IMPLANT
SPONGE LAP 18X18 X RAY DECT (DISPOSABLE) ×2 IMPLANT
STRIP CLOSURE SKIN 1/2X4 (GAUZE/BANDAGES/DRESSINGS) ×2 IMPLANT
SUT MNCRL AB 4-0 PS2 18 (SUTURE) ×2 IMPLANT
SUT SILK 2 0 SH (SUTURE) IMPLANT
SUT VIC AB 2-0 SH 27 (SUTURE) ×2
SUT VIC AB 2-0 SH 27XBRD (SUTURE) ×1 IMPLANT
SUT VIC AB 3-0 SH 27 (SUTURE) ×2
SUT VIC AB 3-0 SH 27X BRD (SUTURE) ×1 IMPLANT
SYR BULB 3OZ (MISCELLANEOUS) ×2 IMPLANT
SYR CONTROL 10ML LL (SYRINGE) ×2 IMPLANT
TOWEL OR 17X24 6PK STRL BLUE (TOWEL DISPOSABLE) ×2 IMPLANT
TOWEL OR 17X26 10 PK STRL BLUE (TOWEL DISPOSABLE) ×2 IMPLANT
TUBE CONNECTING 12X1/4 (SUCTIONS) ×2 IMPLANT
YANKAUER SUCT BULB TIP NO VENT (SUCTIONS) ×2 IMPLANT

## 2018-07-13 NOTE — Anesthesia Postprocedure Evaluation (Signed)
Anesthesia Post Note  Patient: Jessica Myers  Procedure(s) Performed: BREAST LUMPECTOMY WITH RADIOACTIVE SEED LOCALIZATION (Right Breast)     Patient location during evaluation: PACU Anesthesia Type: General Level of consciousness: awake and alert Pain management: pain level controlled Vital Signs Assessment: post-procedure vital signs reviewed and stable Respiratory status: spontaneous breathing, nonlabored ventilation, respiratory function stable and patient connected to nasal cannula oxygen Cardiovascular status: blood pressure returned to baseline and stable Postop Assessment: no apparent nausea or vomiting Anesthetic complications: no    Last Vitals:  Vitals:   07/13/18 1000 07/13/18 1024  BP: 131/63 134/61  Pulse: (!) 59 60  Resp: 14 16  Temp:  36.4 C  SpO2: 99% 99%    Last Pain:  Vitals:   07/13/18 1024  TempSrc: Oral  PainSc: 0-No pain                 Janiyla Long L Chantae Soo

## 2018-07-13 NOTE — Interval H&P Note (Signed)
History and Physical Interval Note:  07/13/2018 7:24 AM  Jessica Myers  has presented today for surgery, with the diagnosis of RIGHT BREAST CANCER  The various methods of treatment have been discussed with the patient and family. After consideration of risks, benefits and other options for treatment, the patient has consented to  Procedure(s): BREAST LUMPECTOMY WITH RADIOACTIVE SEED LOCALIZATION (Right) as a surgical intervention .  The patient's history has been reviewed, patient examined, no change in status, stable for surgery.  I have reviewed the patient's chart and labs.  Questions were answered to the patient's satisfaction.     Rolm Bookbinder

## 2018-07-13 NOTE — Anesthesia Procedure Notes (Signed)
Procedure Name: LMA Insertion Date/Time: 07/13/2018 8:27 AM Performed by: Raenette Rover, CRNA Pre-anesthesia Checklist: Patient identified, Emergency Drugs available, Suction available and Patient being monitored Patient Re-evaluated:Patient Re-evaluated prior to induction Oxygen Delivery Method: Circle system utilized Preoxygenation: Pre-oxygenation with 100% oxygen Induction Type: IV induction Ventilation: Mask ventilation without difficulty LMA: LMA inserted LMA Size: 4.0 Number of attempts: 1 Placement Confirmation: CO2 detector,  positive ETCO2 and breath sounds checked- equal and bilateral Tube secured with: Tape Dental Injury: Teeth and Oropharynx as per pre-operative assessment

## 2018-07-13 NOTE — Op Note (Signed)
Preoperative diagnosis: Clinical stage I right breast cancer Postoperative diagnosis: Same as above Procedure: Right breast seed guided lumpectomy Surgeon: Dr. Serita Grammes Anesthesia: General  Estimated blood loss: Less than 30 cc Specimens:Right breast tissue marked with paint Complications: None Drains: None Sponge needle count was correct at completion Disposition to recovery in stable condition  Indications: This is aN 84 year old female who is diagnosed on screening mammography with a clinical stage I right breast cancer. We discussed all of her options and elected to proceed with a lumpectomy. She had a seed placed prior to beginning. I had these mammograms available in the operating room.  Procedure: After informed consent was obtained the patient was taken to the OR.  She was administered antibiotics. SCDs were in place. She was then placed under general anesthesia without complication. She was prepped and draped in the standard sterile surgical fashion. A surgical timeout was then performed.  I located the radioactive seed in the upper right breast.I infiltrated Marcaine around the areola. I then made an elliptical incision to excise the skin overyling it.  I then used the neoprobe to guide the excision of the seed and the surrounding tissue with an attempt to get a clear margin. I extended the incision to include a skin lesion.  The posterior margin is the muscle.  Hemostasis was obtained. I placed some clips in the cavity. I sutured this closed with 2-0 Vicryl. I then closed the skin with 3-0 Vicryl and 5-0 Monocryl. Glue and Steri-Strips were applied. She tolerated well,was extubated and transferred to recovery stable.

## 2018-07-13 NOTE — Progress Notes (Signed)
Called and spoke with [employer], Tomi Bamberger.  She said she will be in Short Stay department at 0700 to evaluate needs

## 2018-07-13 NOTE — Progress Notes (Signed)
Pt new admit from PACU s/p right breast lumpectomy with dressing dry and intact, with breast binder, alert and oriented, SCD, no complain of pain at this time, is due to void, family at the bedside.

## 2018-07-13 NOTE — H&P (Signed)
84 yof referred by Dr Manuella Ghazi for new right breast cancer. she has history of afib on eliquis and has pacemaker. has history of stroke related to afib in 2014. she has no prior breast history and no fh. she underwent screening mm that shows c density breasts. she had no mass or dc. she was noted to have a 3 mm mass in the right breast on [place] after noting a right breast asymmetry on mm. US of the axilla is negative. biopsy was done and is a grade I-II IDC that is er/pr pos, her2 negative and Ki is 2%. 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 Cataract Surgery  Bilateral. Gallbladder Surgery - Laparoscopic  Tonsillectomy   Diagnostic Studies History Tawni Pummel, RN; 06/20/2018 7:32 AM) Colonoscopy  5-10 years ago Mammogram  within last year Pap Smear  >5 years ago  Medication History Tawni Pummel, RN; 06/20/2018 7:32 AM) Medications Reconciled  Social History Tawni Pummel, RN; 06/20/2018 7:32 AM) No alcohol use  No caffeine use  No drug use  Tobacco use  Never smoker.  Family History Tawni Pummel, RN; 06/20/2018 7:32 AM) Breast Cancer  Mother. Cancer  Brother, Father. Heart disease in female family member before age 65  Respiratory Condition  Father.  Pregnancy / Birth History Tawni Pummel, RN; 06/20/2018 7:32 AM) Age at menarche  61 years. Age of menopause  70-55 Gravida  3 Irregular periods  Length (months) of breastfeeding  3-6 Maternal age  82-25 Para  2  Other Problems Tawni Pummel, RN; 06/20/2018 7:32 AM) Atrial Fibrillation  Back Pain  Cerebrovascular Accident  Cholelithiasis  Hypercholesterolemia  Melanoma  Myocardial infarction  Thyroid Disease   Review of Systems Sunday Spillers Ledford RN; 06/20/2018 7:32 AM) General Not Present- Appetite Loss,  Chills, Fatigue, Fever, Night Sweats, Weight Gain and Weight Loss. Skin Not Present- Change in Wart/Mole, Dryness, Hives, Jaundice, New Lesions, Non-Healing Wounds, Rash and Ulcer. HEENT Present- Hoarseness, Ringing in the Ears and Seasonal Allergies. Not Present- Earache, Hearing Loss, Nose Bleed, Oral Ulcers, Sinus Pain, Sore Throat, Visual Disturbances, Wears glasses/contact lenses and Yellow Eyes. Respiratory Not Present- Bloody sputum, Chronic Cough, Difficulty Breathing, Snoring and Wheezing. Breast Not Present- Breast Mass, Breast Pain, Nipple Discharge and Skin Changes. Cardiovascular Not Present- Chest Pain, Difficulty Breathing Lying Down, Leg Cramps, Palpitations, Rapid Heart Rate, Shortness of Breath and Swelling of Extremities. Gastrointestinal Present- Constipation. Not Present- Abdominal Pain, Bloating, Bloody Stool, Change in Bowel Habits, Chronic diarrhea, Difficulty Swallowing, Excessive gas, Gets full quickly at meals, Hemorrhoids, Indigestion, Nausea, Rectal Pain and Vomiting. Female Genitourinary Present- Urgency. Not Present- Frequency, Nocturia, Painful Urination and Pelvic Pain. Musculoskeletal Present- Back Pain. Not Present- Joint Pain, Joint Stiffness, Muscle Pain, Muscle Weakness and Swelling of Extremities. Neurological Not Present- Decreased Memory, Fainting, Headaches, Numbness, Seizures, Tingling, Tremor, Trouble walking and Weakness. Psychiatric Not Present- Anxiety, Bipolar, Change in Sleep Pattern, Depression, Fearful and Frequent crying. Endocrine Present- Cold Intolerance. Not Present- Excessive Hunger, Hair Changes, Heat Intolerance, Hot flashes and New Diabetes. Hematology Present- Blood Thinners and Easy Bruising. Not Present- Excessive bleeding, Gland problems, HIV and Persistent Infections.   Physical Exam Rolm Bookbinder MD; 06/20/2018 5:08 PM) General Mental Status-Alert. Head and Neck Trachea-midline. Thyroid Gland Characteristics - normal size  and consistency. Eye Sclera/Conjunctiva - Bilateral-No scleral icterus. Chest and Lung Exam Chest and lung  exam reveals -quiet, even and easy respiratory effort with no use of accessory muscles and on auscultation, normal breath sounds, no adventitious sounds and normal vocal resonance. Breast Nipples-No Discharge. Breast Lump-No Palpable Breast Mass. Cardiovascular Cardiovascular examination reveals -normal heart sounds, regular rate and rhythm with no murmurs. Abdomen Note: soft nt no hepatomegaly Neurologic Neurologic evaluation reveals -alert and oriented x 3 with no impairment of recent or remote memory. Lymphatic Head & Neck General Head & Neck Lymphatics: Bilateral - Description - Normal. Axillary General Axillary Region: Bilateral - Description - Normal. Note: no [place] adenopathy   Assessment & Plan Rolm Bookbinder MD; 06/20/2018 5:11 PM) BREAST CANCER OF UPPER-OUTER QUADRANT OF RIGHT FEMALE BREAST (C50.411) Story: Right breast seed guided lumpectomy we discussed staging and pathophysiology of breast cancer. we discussed all available treatment options. she does not need lymph node staging due to age, 3 mm tumor, grade. It will not change therapy or impact survival we discussed surgical options. she does not need mastectomy and there is no advantage to this. we discussed seed guided lumpectomy with risk of positive margin. she likely should not need radiotherapy and could possibly have antiestrogen postop.

## 2018-07-13 NOTE — Anesthesia Preprocedure Evaluation (Addendum)
Anesthesia Evaluation  Patient identified by MRN, date of birth, ID band Patient awake    Reviewed: Allergy & Precautions, NPO status , Patient's Chart, lab work & pertinent test results  Airway Mallampati: II  TM Distance: >3 FB Neck ROM: Full    Dental no notable dental hx. (+) Teeth Intact, Dental Advisory Given   Pulmonary    Pulmonary exam normal breath sounds clear to auscultation       Cardiovascular hypertension, Pt. on medications Normal cardiovascular exam+ dysrhythmias (on eliquis, last dose on 07/10/18) Atrial Fibrillation + pacemaker (2014)  Rhythm:Irregular Rate:Normal  Loop Recorder 05/2018 Remote device check reviewed. Histograms appropriate. Leads and battery stable for patient. Follow up as outlined above. No recommended changes.  TEE 2014 - Left ventricle: Systolic function was normal. The estimated ejection fraction was in the range of 55% to 60%. - Aortic valve: No evidence of vegetation. Mild to moderate regurgitation. - Mitral valve: No evidence of vegetation. Mild to moderate regurgitation. - Left atrium: No evidence of thrombus in the atrial cavity or appendage. No evidence of thrombus in the atrial cavityor appendage. - Pulmonic valve: No evidence of vegetation.   Neuro/Psych  Headaches, CVA (2014) negative psych ROS   GI/Hepatic negative GI ROS, Neg liver ROS,   Endo/Other  Hypothyroidism   Renal/GU negative Renal ROS  negative genitourinary   Musculoskeletal negative musculoskeletal ROS (+)   Abdominal   Peds  Hematology  (+) anemia ,   Anesthesia Other Findings Right breast cancer  [employer] to interrogate PPM  Reproductive/Obstetrics                            Anesthesia Physical Anesthesia Plan  ASA: III  Anesthesia Plan: General   Post-op Pain Management:    Induction: Intravenous  PONV Risk Score and Plan: 3 and Dexamethasone,  Ondansetron and Treatment may vary due to age or medical condition  Airway Management Planned: LMA  Additional Equipment:   Intra-op Plan:   Post-operative Plan: Extubation in OR  Informed Consent: I have reviewed the patients History and Physical, chart, labs and discussed the procedure including the risks, benefits and alternatives for the proposed anesthesia with the patient or authorized representative who has indicated his/her understanding and acceptance.   Dental advisory given  Plan Discussed with: CRNA  Anesthesia Plan Comments:         Anesthesia Quick Evaluation

## 2018-07-13 NOTE — Transfer of Care (Signed)
Immediate Anesthesia Transfer of Care Note  Patient: Jessica Myers  Procedure(s) Performed: BREAST LUMPECTOMY WITH RADIOACTIVE SEED LOCALIZATION (Right Breast)  Patient Location: PACU  Anesthesia Type:General  Level of Consciousness: awake, alert , oriented, drowsy and patient cooperative  Airway & Oxygen Therapy: Patient Spontanous Breathing and Patient connected to face mask oxygen  Post-op Assessment: Report given to RN and Post -op Vital signs reviewed and stable  Post vital signs: Reviewed and stable  Last Vitals:  Vitals Value Taken Time  BP 124/54 07/13/2018  9:16 AM  Temp    Pulse 61 07/13/2018  9:16 AM  Resp 16 07/13/2018  9:16 AM  SpO2 100 % 07/13/2018  9:16 AM  Vitals shown include unvalidated device data.  Last Pain:  Vitals:   07/13/18 0651  TempSrc: Oral  PainSc: 0-No pain      Patients Stated Pain Goal: 3 (34/19/37 9024)  Complications: No apparent anesthesia complications

## 2018-07-14 ENCOUNTER — Encounter (HOSPITAL_COMMUNITY): Payer: Self-pay | Admitting: General Surgery

## 2018-07-14 DIAGNOSIS — C50411 Malignant neoplasm of upper-outer quadrant of right female breast: Secondary | ICD-10-CM | POA: Diagnosis not present

## 2018-07-14 NOTE — Progress Notes (Signed)
Discharge: Pt d/c from room via walking out with the family. Family member with the pt. Discharge instructions given to the patient and family members.  No questions from pt, reintegrated to the pt to call or go to the ED for chest discomfort. Pt dressed in street clothes and left with discharge papers in hand. IV d/ced,  no complaints of pain or discomfort.

## 2018-07-14 NOTE — Discharge Summary (Signed)
Physician Discharge Summary    Patient ID: MYRELLA FAHS MRN: [id number] DOB/AGE: 12-12-1928  84 y.o.  Admit date: 07/13/2018 Discharge date: 07/14/2018   Hospital Stay = 0 days  Patient Care Team: Monico Blitz, MD as PCP - General (Internal Medicine) Evans Lance, MD as Consulting Physician (Cardiology) Rolm Bookbinder, MD as Consulting Physician (General Surgery) Nicholas Lose, MD as Consulting Physician (Hematology and Oncology) Kyung Rudd, MD as Consulting Physician (Radiation Oncology)  Discharge Diagnoses:  Active Problems:   Breast cancer, right ([place])   1 Day Post-Op  07/13/2018  POST-OPERATIVE DIAGNOSIS:   RIGHT BREAST CANCER  SURGERY:  07/13/2018  Procedure(s): BREAST LUMPECTOMY WITH RADIOACTIVE SEED LOCALIZATION  SURGEON:    Surgeon(s): Rolm Bookbinder, MD  Consults: None  Hospital Course:   The patient underwent the surgery above.  Postoperatively, the patient gradually mobilized and advanced to a solid diet.  Pain and other symptoms were treated aggressively.    By the time of discharge, the patient was walking well the hallways, eating food, having flatus.  Pain was well-controlled on an oral medications.  Based on meeting discharge criteria and continuing to recover, I felt it was safe for the patient to be discharged from the hospital to further recover with close followup. Postoperative recommendations were discussed in detail.  They are written as well.  Discharged Condition: good  Disposition:  Follow-up Information    Rolm Bookbinder, MD In 2 weeks.   Specialty:  General Surgery Contact information: [place] [address] [phone]           Discharge disposition: 01-Home or Self Care       Discharge Instructions    Call MD for:   Complete by:  As directed    FEVER > 101.5 F  (temperatures < 101.5 F are not significant)   Call MD for:  extreme fatigue   Complete by:  As directed      Call MD for:  persistant dizziness or light-headedness   Complete by:  As directed    Call MD for:  persistant nausea and vomiting   Complete by:  As directed    Call MD for:  redness, tenderness, or signs of infection (pain, swelling, redness, odor or green/yellow discharge around incision site)   Complete by:  As directed    Call MD for:  severe uncontrolled pain   Complete by:  As directed    Diet - low sodium heart healthy   Complete by:  As directed    Start with a bland diet such as soups, liquids, starchy foods, low fat foods, etc. the first few days at home. Gradually advance to a solid, low-fat, high fiber diet by the end of the first week at home.   Add a fiber supplement to your diet (Metamucil, etc) If you feel full, bloated, or constipated, stay on a full liquid or pureed/blenderized diet for a few days until you feel better and are no longer constipated.   Discharge instructions   Complete by:  As directed    See Discharge Instructions If you are not getting better after two weeks or are noticing you are getting worse, contact our office [phone] for further advice.  We may need to adjust your medications, re-evaluate you in the office, send you to the emergency room, or see what other things we can do to help. The clinic staff is available to answer your questions during regular business hours (8:30am-5pm).  Please  don't hesitate to call and ask to speak to one of our nurses for clinical concerns.    A surgeon from [facility] is always on call at the hospitals 24 hours/day If you have a medical emergency, go to the nearest emergency room or call 911.   Discharge wound care:   Complete by:  As directed    It is good for closed incisions and even open wounds to be washed every day.  Shower every day.  Short baths are fine.  Wash the incisions and wounds clean with soap & water.     WEAR THE SPORTS BRA FOR 72 HOURS STRAIGHT (day & night).  See d/c  instruction sheet  You may leave closed incisions open to air if it is dry.   You may cover the incision with clean gauze & replace it after your daily shower for comfort.   If you have skin tapes (Steristrips) or skin glue (Dermabond) on your incision, leave them in place.  They will fall off on their own like a scab.  You may trim any edges that curl up with clean scissors.   Driving Restrictions   Complete by:  As directed    You may drive when: - you are no longer taking narcotic prescription pain medication - you can comfortably wear a seatbelt - you can safely make sudden turns/stops without pain.   Increase activity slowly   Complete by:  As directed    Start light daily activities --- self-care, walking, climbing stairs- beginning the day after surgery.  Gradually increase activities as tolerated.  Control your pain to be active.  Stop when you are tired.  Ideally, walk several times a day, eventually an hour a day.   Most people are back to most day-to-day activities in a few weeks.  It takes 4-6 weeks to get back to unrestricted, intense activity. If you can walk 30 minutes without difficulty, it is safe to try more intense activity such as jogging, treadmill, bicycling, low-impact aerobics, swimming, etc. Save the most intensive and strenuous activity for last (Usually 4-8 weeks after surgery) such as sit-ups, heavy lifting, contact sports, etc.  Refrain from any intense heavy lifting or straining until you are off narcotics for pain control.  You will have off days, but things should improve week-by-week. DO NOT PUSH THROUGH PAIN.  Let pain be your guide: If it hurts to do something, don't do it.   Lifting restrictions   Complete by:  As directed    If you can walk 30 minutes without difficulty, it is safe to try more intense activity such as jogging, treadmill, bicycling, low-impact aerobics, swimming, etc. Save the most intensive and strenuous activity for last (Usually 4-8 weeks  after surgery) such as sit-ups, heavy lifting, contact sports, etc.   Refrain from any intense heavy lifting or straining until you are off narcotics for pain control.  You will have off days, but things should improve week-by-week. DO NOT PUSH THROUGH PAIN.  Let pain be your guide: If it hurts to do something, don't do it.  Pain is your body warning you to avoid that activity for another week until the pain goes down.   May shower / Bathe   Complete by:  As directed    May walk up steps   Complete by:  As directed    Sexual Activity Restrictions   Complete by:  As directed    You may have sexual intercourse when it is  comfortable. If it hurts to do something, stop.      Allergies as of 07/14/2018      Reactions   Biaxin [clarithromycin] Shortness Of Breath, Rash   Unknown   Penicillins Hives   Has patient had a PCN reaction causing immediate rash, facial/tongue/throat swelling, SOB or lightheadedness with hypotension: Yes Has patient had a PCN reaction causing severe rash involving mucus membranes or skin necrosis: No Has patient had a PCN reaction that required hospitalization No Has patient had a PCN reaction occurring within the last 10 years: No If all of the above answers are "NO", then may proceed with Cephalosporin use.   Shellfish Allergy Nausea And Vomiting   Sulfa Antibiotics Other (See Comments)   "bad reaction," per patient   Celebrex [celecoxib] Rash      Medication List    TAKE these medications   acetaminophen 500 MG tablet Commonly known as:  TYLENOL Take 500-1,000 mg by mouth every 6 (six) hours as needed for mild pain.   atorvastatin 10 MG tablet Commonly known as:  LIPITOR Take 10 mg by mouth every evening.   cholecalciferol 1000 units tablet Commonly known as:  VITAMIN D Take 1,000 Units by mouth daily.   [employer] EAR DROPS 6.5 % OTIC solution Generic drug:  carbamide peroxide Place 5-10 drops into both ears 2 (two) times daily as needed (wax build up).     ELIQUIS 5 MG Tabs tablet Generic drug:  apixaban TAKE 1 TABLET BY MOUTH TWICE A DAY   flecainide 150 MG tablet Commonly known as:  TAMBOCOR TAKE 1/2 TABLETS (75 MG TOTAL) BY MOUTH 2 (TWO) TIMES DAILY. PLEASE KEEP UPCOMING APPT IN FEBRUARY   levothyroxine 112 MCG tablet Commonly known as:  SYNTHROID, LEVOTHROID Take 112 mcg by mouth daily before breakfast.   lisinopril 10 MG tablet Commonly known as:  PRINIVIL,ZESTRIL Take 10 mg by mouth 2 (two) times daily.   meclizine 25 MG tablet Commonly known as:  ANTIVERT Take 25 mg by mouth 3 (three) times daily as needed for dizziness.   metoprolol succinate 25 MG 24 hr tablet Commonly known as:  TOPROL-XL Take 1 tablet (25 mg total) by mouth 3 (three) times daily. Please make an appt for more refills 1st attempt   omeprazole 20 MG capsule Commonly known as:  PRILOSEC Take 20 mg by mouth at bedtime.   polyethylene glycol packet Commonly known as:  MIRALAX / GLYCOLAX Take 17 g by mouth daily as needed (for constipation.).   simethicone 125 MG chewable tablet Commonly known as:  MYLICON Chew 481-856 mg by mouth every 6 (six) hours as needed for flatulence.   SYSTANE 0.4-0.3 % Soln Generic drug:  Polyethyl Glycol-Propyl Glycol Place 1 drop into both eyes 3 (three) times daily as needed (for dry eyes.).            Discharge Care Instructions  (From admission, onward)         Start     Ordered   07/14/18 0000  Discharge wound care:    Comments:  It is good for closed incisions and even open wounds to be washed every day.  Shower every day.  Short baths are fine.  Wash the incisions and wounds clean with soap & water.     WEAR THE SPORTS BRA FOR 72 HOURS STRAIGHT (day & night).  See d/c instruction sheet  You may leave closed incisions open to air if it is dry.   You may cover the incision with  clean gauze & replace it after your daily shower for comfort.   If you have skin tapes (Steristrips) or skin glue (Dermabond) on  your incision, leave them in place.  They will fall off on their own like a scab.  You may trim any edges that curl up with clean scissors.   07/14/18 2542          Significant Diagnostic Studies:  Results for orders placed or performed during the hospital encounter of 07/13/18 (from the past 72 hour(s))  Protime-INR     Status: None   Collection Time: 07/13/18  6:35 AM  Result Value Ref Range   Prothrombin Time 13.9 11.4 - 15.2 seconds   INR 1.08     Comment: Performed at [facility] [address] [place], [place] [address]    No results found.  Discharge Exam: Blood pressure (!) 99/45, pulse 65, temperature (!) 97.3 F (36.3 C), temperature source Oral, resp. rate 16, height 5\' 4"  (1.626 m), weight 60.8 kg, SpO2 97 %.  General: Pt awake/alert/oriented x4 in No acute distress.  Walking in hallways Eyes: PERRL, normal EOM.  Sclera clear.  No icterus Neuro: CN II-XII intact w/o focal sensory/motor deficits. Lymph: No head/neck/groin lymphadenopathy Psych:  No delerium/psychosis/paranoia HENT: Normocephalic, Mucus membranes moist.  No thrush Neck: Supple, No tracheal deviation Chest: No chest wall pain w good excursion.  Sports bra in place.   CV:  Pulses intact.  Regular rhythm MS: Normal AROM mjr joints.  No obvious deformity Abdomen: Soft.  Nondistended.  Nontender.  No evidence of peritonitis.  No incarcerated hernias. Ext:  SCDs BLE.  No mjr edema.  No cyanosis Skin: No petechiae / purpura  Past Medical History:  Diagnosis Date  . A-fib ([place])   . Anxiety   . Bradycardia    s/p PPM October 2014  . Chronic anticoagulation   . Dysrhythmia   . Headache(784.0)   . High cholesterol   . Hypertension   . Malignant neoplasm of upper-outer quadrant of right breast in female, estrogen receptor positive ([place]) 06/15/2018  . Multiple rib fractures 07/01/2016   fell down stairs   . Pulmonary contusion 06/2016   from a fall   . Stroke [facility]) September 2014    thrombolytic therapy  . Thyroid disease   . Vertical vertigo     Past Surgical History:  Procedure Laterality Date  . BREAST LUMPECTOMY WITH RADIOACTIVE SEED LOCALIZATION Right 07/13/2018   Procedure: BREAST LUMPECTOMY WITH RADIOACTIVE SEED LOCALIZATION;  Surgeon: Rolm Bookbinder, MD;  Location: [facility];  Service: General;  Laterality: Right;  . CHOLECYSTECTOMY    . INSERT / REPLACE / REMOVE PACEMAKER    . PACEMAKER INSERTION    . PERMANENT PACEMAKER INSERTION N/A 07/25/2013   Procedure: PERMANENT PACEMAKER INSERTION;  Surgeon: Deboraha Sprang, MD;  Location: [facility] CATH LAB;  Service: Cardiovascular;  Laterality: N/A;  . TEE WITHOUT CARDIOVERSION N/A 07/16/2013   Procedure: TRANSESOPHAGEAL ECHOCARDIOGRAM (TEE);  Surgeon: Thayer Headings, MD;  Location: [facility] ENDOSCOPY;  Service: Cardiovascular;  Laterality: N/A;    Social History   Socioeconomic History  . Marital status: Married    Spouse name: leroy  . Number of children: 2  . Years of education: 65  . Highest education level: Not on file  Occupational History  . Occupation: retired  Scientific laboratory technician  . Financial resource strain: Not on file  . Food insecurity:    Worry: Not on file    Inability: Not on  file  . Transportation needs:    Medical: Not on file    Non-medical: Not on file  Tobacco Use  . Smoking status: Never Smoker  . Smokeless tobacco: Never Used  Substance and Sexual Activity  . Alcohol use: No    Alcohol/week: 0.0 standard drinks  . Drug use: No  . Sexual activity: Never  Lifestyle  . Physical activity:    Days per week: Not on file    Minutes per session: Not on file  . Stress: Not on file  Relationships  . Social connections:    Talks on phone: Not on file    Gets together: Not on file    Attends religious service: Not on file    Active member of club or organization: Not on file    Attends meetings of clubs or organizations: Not on file    Relationship status: Not on file  . Intimate partner violence:      Fear of current or ex partner: Not on file    Emotionally abused: Not on file    Physically abused: Not on file    Forced sexual activity: Not on file  Other Topics Concern  . Not on file  Social History Narrative   Patient lives at home with her husband.    Family History  Problem Relation Age of Onset  . Cancer Mother   . Breast cancer Mother   . Cancer Father   . Lung cancer Father     Current Facility-Administered Medications  Medication Dose Route Frequency Provider Last Rate Last Dose  . acetaminophen (TYLENOL) tablet 1,000 mg  1,000 mg Oral Q6H Rolm Bookbinder, MD   1,000 mg at 07/14/18 0553  . flecainide (TAMBOCOR) tablet 75 mg  75 mg Oral Q12H Rolm Bookbinder, MD   75 mg at 07/13/18 2217  . levothyroxine (SYNTHROID, LEVOTHROID) tablet 112 mcg  112 mcg Oral QAC breakfast Rolm Bookbinder, MD      . lisinopril (PRINIVIL,ZESTRIL) tablet 10 mg  10 mg Oral BID Rolm Bookbinder, MD   10 mg at 07/13/18 1432  . meclizine (ANTIVERT) tablet 25 mg  25 mg Oral TID PRN Rolm Bookbinder, MD   25 mg at 07/14/18 0601  . metoprolol succinate (TOPROL-XL) 24 hr tablet 25 mg  25 mg Oral Q8H Rolm Bookbinder, MD   25 mg at 07/14/18 0555  . ondansetron (ZOFRAN-ODT) disintegrating tablet 4 mg  4 mg Oral Q6H PRN Rolm Bookbinder, MD       Or  . ondansetron [facility]) injection 4 mg  4 mg Intravenous Q6H PRN Rolm Bookbinder, MD      . oxyCODONE (Oxy IR/ROXICODONE) immediate release tablet 5 mg  5 mg Oral Q6H PRN Rolm Bookbinder, MD      . pantoprazole (PROTONIX) EC tablet 40 mg  40 mg Oral Daily Rolm Bookbinder, MD      . polyethylene glycol (MIRALAX / GLYCOLAX) packet 17 g  17 g Oral Daily PRN Rolm Bookbinder, MD      . simethicone [facility]) chewable tablet 80 mg  80 mg Oral Q6H PRN Rolm Bookbinder, MD         Allergies  Allergen Reactions  . Biaxin [Clarithromycin] Shortness Of Breath and Rash    Unknown  . Penicillins Hives    Has patient had a PCN reaction  causing immediate rash, facial/tongue/throat swelling, SOB or lightheadedness with hypotension: Yes Has patient had a PCN reaction causing severe rash involving mucus membranes or skin necrosis: No Has patient  had a PCN reaction that required hospitalization No Has patient had a PCN reaction occurring within the last 10 years: No If all of the above answers are "NO", then may proceed with Cephalosporin use.    . Shellfish Allergy Nausea And Vomiting  . Sulfa Antibiotics Other (See Comments)    "bad reaction," per patient  . Celebrex [Celecoxib] Rash    Signed: Morton Peters, MD, FACS, MASCRS Gastrointestinal and Minimally Invasive Surgery    [address], [place], [place] [address] [phone] Main / Paging [phone] Fax   07/14/2018, 8:21 AM

## 2018-07-14 NOTE — Progress Notes (Signed)
Pt alert and oriented x4, no complaints of pain or discomfort.  Bed in low position, call bell within reach.  Bed alarms on and functioning.  Assessment done and charted.  Will continue to monitor and do hourly rounding throughout the shift 

## 2018-07-14 NOTE — Discharge Instructions (Signed)
[facility] Office Phone Number [phone]  POST OP INSTRUCTIONS RESTART ELIQUIS ON Sunday 9/29 Take 400 mg of ibuprofen every 8 hours or 650 mg tylenol every 6 hours for next 72 hours then as needed. Use ice several times daily also. Always review your discharge instruction sheet given to you by the facility where your surgery was performed.  IF YOU HAVE DISABILITY OR FAMILY LEAVE FORMS, YOU MUST BRING THEM TO THE OFFICE FOR PROCESSING.  DO NOT GIVE THEM TO YOUR DOCTOR.  1. A prescription for pain medication may be given to you upon discharge.  Take your pain medication as prescribed, if needed.  If narcotic pain medicine is not needed, then you may take acetaminophen (Tylenol), naprosyn (Alleve) or ibuprofen (Advil) as needed. 2. Take your usually prescribed medications unless otherwise directed 3. If you need a refill on your pain medication, please contact your pharmacy.  They will contact our office to request authorization.  Prescriptions will not be filled after 5pm or on week-ends. 4. You should eat very light the first 24 hours after surgery, such as soup, crackers, pudding, etc.  Resume your normal diet the day after surgery. 5. Most patients will experience some swelling and bruising in the breast.  Ice packs and a good support bra will help.  Wear the breast binder provided or a sports bra for 72 hours day and night.  After that wear a sports bra during the day until you return to the office. Swelling and bruising can take several days to resolve.   6. It is common to experience some constipation if taking pain medication after surgery.  Increasing fluid intake and taking a stool softener will usually help or prevent this problem from occurring.  A mild laxative (Milk of Magnesia or Miralax) should be taken according to package directions if there are no bowel movements after 48 hours. 7. Unless discharge instructions indicate otherwise, you may remove your bandages 48  hours after surgery and you may shower at that time.  You may have steri-strips (small skin tapes) in place directly over the incision.  These strips should be left on the skin for 7-10 days and will come off on their own.  If your surgeon used skin glue on the incision, you may shower in 24 hours.  The glue will flake off over the next 2-3 weeks.  Any sutures or staples will be removed at the office during your follow-up visit. 8. ACTIVITIES:  You may resume regular daily activities (gradually increasing) beginning the next day.  Wearing a good support bra or sports bra minimizes pain and swelling.  You may have sexual intercourse when it is comfortable. a. You may drive when you no longer are taking prescription pain medication, you can comfortably wear a seatbelt, and you can safely maneuver your car and apply brakes. b. RETURN TO WORK:  ______________________________________________________________________________________ 9. You should see your doctor in the office for a follow-up appointment approximately two weeks after your surgery.  Your doctors nurse will typically make your follow-up appointment when she calls you with your pathology report.  Expect your pathology report 3-4 business days after your surgery.  You may call to check if you do not hear from [place] after three days. 10. OTHER INSTRUCTIONS: _______________________________________________________________________________________________ _____________________________________________________________________________________________________________________________________ _____________________________________________________________________________________________________________________________________ _____________________________________________________________________________________________________________________________________  WHEN TO CALL DR [place]: 1. Fever over 101.0 2. Nausea and/or vomiting. 3. Extreme swelling or  bruising. 4. Continued bleeding from incision. 5. Increased pain, redness, or drainage from the incision.  The clinic staff is available to answer your questions during regular business hours.  Please dont hesitate to call and ask to speak to one of the nurses for clinical concerns.  If you have a medical emergency, go to the nearest emergency room or call 911.  A surgeon from [facility] is always on call at the hospital.  For further questions, please visit [email or web address]

## 2018-07-23 ENCOUNTER — Inpatient Hospital Stay: Payer: PPO | Attending: Hematology and Oncology | Admitting: Hematology and Oncology

## 2018-07-23 DIAGNOSIS — Z79899 Other long term (current) drug therapy: Secondary | ICD-10-CM

## 2018-07-23 DIAGNOSIS — Z853 Personal history of malignant neoplasm of breast: Secondary | ICD-10-CM | POA: Insufficient documentation

## 2018-07-23 DIAGNOSIS — C50411 Malignant neoplasm of upper-outer quadrant of right female breast: Secondary | ICD-10-CM | POA: Diagnosis not present

## 2018-07-23 DIAGNOSIS — Z170 Estrogen receptor positive status [ER+]: Secondary | ICD-10-CM | POA: Insufficient documentation

## 2018-07-23 DIAGNOSIS — Z7901 Long term (current) use of anticoagulants: Secondary | ICD-10-CM | POA: Insufficient documentation

## 2018-07-23 MED ORDER — LETROZOLE 2.5 MG PO TABS: 2.5000 mg | ORAL_TABLET | Freq: Every day | ORAL | 3 refills | Status: DC

## 2018-07-23 NOTE — Progress Notes (Signed)
Patient Care Team: Monico Blitz, MD as PCP - General (Internal Medicine) Evans Lance, MD as Consulting Physician (Cardiology) Rolm Bookbinder, MD as Consulting Physician (General Surgery) Nicholas Lose, MD as Consulting Physician (Hematology and Oncology) Kyung Rudd, MD as Consulting Physician (Radiation Oncology)  DIAGNOSIS:  Encounter Diagnosis  Name Primary?  . Malignant neoplasm of upper-outer quadrant of right breast in female, estrogen receptor positive ([place])     SUMMARY OF ONCOLOGIC HISTORY:   Malignant neoplasm of upper-outer quadrant of right breast in female, estrogen receptor positive ([place])   06/11/2018 Initial Diagnosis    Screening detected right breast asymmetry posteriorly measuring 0.3 cm at 12 o'clock position.  Biopsy revealed grade 1-2 invasive ductal carcinoma ER 100%, PR 80%, Ki-67 2%, HER-2 negative, T1 a N0 stage Ia    07/13/2018 Surgery    Right lumpectomy: IDC grade 2, 0.7 cm, cancer does not involve margins, negative for lymphovascular or perineural invasion, lymph nodes not sampled, ER 100%, PR 80%, HER-2 negative, Ki-67 2%, T1B NX stage Ia     CHIEF COMPLIANT: Follow-up after right lumpectomy  INTERVAL HISTORY: RANEISHA BRESS is a 84 year old with above-mentioned history left breast cancer treated with lumpectomy and is here today to discuss a pathology report.  She is recovering very well from the surgery.  She has no pain or discomfort.  REVIEW OF SYSTEMS:   Constitutional: Denies fevers, chills or abnormal weight loss Eyes: Denies blurriness of vision Ears, nose, mouth, throat, and face: Denies mucositis or sore throat Respiratory: Denies cough, dyspnea or wheezes Cardiovascular: Denies palpitation, chest discomfort Gastrointestinal:  Denies nausea, heartburn or change in bowel habits Skin: Denies abnormal skin rashes Lymphatics: Denies new lymphadenopathy or easy bruising Neurological:Denies numbness, tingling or new  weaknesses Behavioral/Psych: Mood is stable, no new changes  Extremities: No lower extremity edema Breast:  denies any pain or lumps or nodules in either breasts All other systems were reviewed with the patient and are negative.  I have reviewed the past medical history, past surgical history, social history and family history with the patient and they are unchanged from previous note.  ALLERGIES:  is allergic to biaxin [clarithromycin]; penicillins; shellfish allergy; sulfa antibiotics; and celebrex [celecoxib].  MEDICATIONS:  Current Outpatient Medications  Medication Sig Dispense Refill  . acetaminophen (TYLENOL) 500 MG tablet Take 500-1,000 mg by mouth every 6 (six) hours as needed for mild pain.    Marland Kitchen atorvastatin (LIPITOR) 10 MG tablet Take 10 mg by mouth every evening.     . carbamide peroxide ([employer] EAR DROPS) 6.5 % OTIC solution Place 5-10 drops into both ears 2 (two) times daily as needed (wax build up).    . cholecalciferol (VITAMIN D) 1000 UNITS tablet Take 1,000 Units by mouth daily.     Marland Kitchen ELIQUIS 5 MG TABS tablet TAKE 1 TABLET BY MOUTH TWICE A DAY 180 tablet 2  . flecainide (TAMBOCOR) 150 MG tablet TAKE 1/2 TABLETS (75 MG TOTAL) BY MOUTH 2 (TWO) TIMES DAILY. PLEASE KEEP UPCOMING APPT IN FEBRUARY 90 tablet 3  . levothyroxine (SYNTHROID, LEVOTHROID) 112 MCG tablet Take 112 mcg by mouth daily before breakfast.  2  . lisinopril (PRINIVIL,ZESTRIL) 10 MG tablet Take 10 mg by mouth 2 (two) times daily.    . meclizine (ANTIVERT) 25 MG tablet Take 25 mg by mouth 3 (three) times daily as needed for dizziness.    . metoprolol succinate (TOPROL-XL) 25 MG 24 hr tablet Take 1 tablet (25 mg total) by mouth 3 (three) times  daily. Please make an appt for more refills 1st attempt 270 tablet 3  . omeprazole (PRILOSEC) 20 MG capsule Take 20 mg by mouth at bedtime.     Vladimir Faster Glycol-Propyl Glycol (SYSTANE) 0.4-0.3 % SOLN Place 1 drop into both eyes 3 (three) times daily as needed (for dry eyes.).     Marland Kitchen polyethylene glycol (MIRALAX / GLYCOLAX) packet Take 17 g by mouth daily as needed (for constipation.).     Marland Kitchen simethicone (MYLICON) 633 MG chewable tablet Chew 125-250 mg by mouth every 6 (six) hours as needed for flatulence.     No current facility-administered medications for this visit.     PHYSICAL EXAMINATION: ECOG PERFORMANCE STATUS: 0 - Asymptomatic  Vitals:   07/23/18 1022  BP: 136/90  Pulse: 96  Resp: 17  Temp: 97.7 F (36.5 C)  SpO2: 100%   Filed Weights   07/23/18 1022  Weight: 134 lb 11.2 oz (61.1 kg)    GENERAL:alert, no distress and comfortable SKIN: skin color, texture, turgor are normal, no rashes or significant lesions EYES: normal, Conjunctiva are pink and non-injected, sclera clear OROPHARYNX:no exudate, no erythema and lips, buccal mucosa, and tongue normal  NECK: supple, thyroid normal size, non-tender, without nodularity LYMPH:  no palpable lymphadenopathy in the cervical, axillary or inguinal LUNGS: clear to auscultation and percussion with normal breathing effort HEART: regular rate & rhythm and no murmurs and no lower extremity edema ABDOMEN:abdomen soft, non-tender and normal bowel sounds MUSCULOSKELETAL:no cyanosis of digits and no clubbing  NEURO: alert & oriented x 3 with fluent speech, no focal motor/sensory deficits EXTREMITIES: No lower extremity edema   LABORATORY DATA:  I have reviewed the data as listed CMP Latest Ref Rng & Units 07/06/2018 06/20/2018 12/13/2017  Glucose 70 - 99 mg/dL 98 83 85  BUN 8 - 23 mg/dL 25(H) 25(H) 19  Creatinine 0.44 - 1.00 mg/dL 1.48(H) 1.34(H) 1.24(H)  Sodium 135 - 145 mmol/L 139 141 139  Potassium 3.5 - 5.1 mmol/L 4.7 4.9 4.3  Chloride 98 - 111 mmol/L 105 106 103  CO2 22 - 32 mmol/L _0 Calcium 8.9 - 10.3 mg/dL 9.3 10.1 9.7  Total Protein 6.5 - 8.1 g/dL - 7.3 -  Total Bilirubin 0.3 - 1.2 mg/dL - 0.8 -  Alkaline Phos 38 - 126 U/L - 83 -  AST 15 - 41 U/L - 17 -  ALT 0 - 44 U/L - 11 -    Lab  Results  Component Value Date   WBC 5.9 07/06/2018   HGB 12.3 07/06/2018   HCT 38.7 07/06/2018   MCV 98.7 07/06/2018   PLT 170 07/06/2018   NEUTROABS 3.0 06/20/2018    ASSESSMENT & PLAN:  Malignant neoplasm of upper-outer quadrant of right breast in female, estrogen receptor positive ([place]) 07/13/2018: Right lumpectomy: IDC grade 2, 0.7 cm, cancer does not involve margins, negative for lymphovascular or perineural invasion, lymph nodes not sampled, ER 100%, PR 80%, HER-2 negative, Ki-67 2%, T1B NX stage Ia  Pathology counseling: I discussed the final pathology report of the patient provided  a copy of this report. I discussed the margins   We also discussed the final staging along with previously performed ER/PR and HER-2/neu testing.  Recommendation: Adjuvant antiestrogen therapy with letrozole 2.5 mg daily x5 years  I gave a prescription for letrozole she will come back in 3 months for survivorship care plan visit Letrozole counseling:We discussed the risks and benefits of anti-estrogen therapy with aromatase inhibitors. These include  but not limited to insomnia, hot flashes, mood changes, vaginal dryness, bone density loss, and weight gain. We strongly believe that the benefits far outweigh the risks. Patient understands these risks and consented to starting treatment. Planned treatment duration is 5 years.  Return to clinic in 3 months for survivorship care plan visit    No orders of the defined types were placed in this encounter.  The patient has a good understanding of the overall plan. she agrees with it. she will call with any problems that may develop before the next visit here.   Harriette Ohara, MD 07/23/18

## 2018-07-23 NOTE — Assessment & Plan Note (Signed)
07/13/2018: Right lumpectomy: IDC grade 2, 0.7 cm, cancer does not involve margins, negative for lymphovascular or perineural invasion, lymph nodes not sampled, ER 100%, PR 80%, HER-2 negative, Ki-67 2%, T1B NX stage Ia  Pathology counseling: I discussed the final pathology report of the patient provided  a copy of this report. I discussed the margins   We also discussed the final staging along with previously performed ER/PR and HER-2/neu testing.  Recommendation: Adjuvant antiestrogen therapy with letrozole 2.5 mg daily x5 years  I gave a prescription for letrozole she will come back in 3 months for survivorship care plan visit

## 2018-07-24 ENCOUNTER — Telehealth: Payer: Self-pay | Admitting: Hematology and Oncology

## 2018-07-24 NOTE — Telephone Encounter (Signed)
Mailed pt calendar  °

## 2018-08-15 DIAGNOSIS — Z299 Encounter for prophylactic measures, unspecified: Secondary | ICD-10-CM | POA: Diagnosis not present

## 2018-08-15 DIAGNOSIS — Z1211 Encounter for screening for malignant neoplasm of colon: Secondary | ICD-10-CM | POA: Diagnosis not present

## 2018-08-15 DIAGNOSIS — R5383 Other fatigue: Secondary | ICD-10-CM | POA: Diagnosis not present

## 2018-08-15 DIAGNOSIS — E7800 Pure hypercholesterolemia, unspecified: Secondary | ICD-10-CM | POA: Diagnosis not present

## 2018-08-15 DIAGNOSIS — Z0000 Encounter for general adult medical examination without abnormal findings: Secondary | ICD-10-CM | POA: Diagnosis not present

## 2018-08-15 DIAGNOSIS — Z6823 Body mass index (BMI) 23.0-23.9, adult: Secondary | ICD-10-CM | POA: Diagnosis not present

## 2018-08-15 DIAGNOSIS — Z7189 Other specified counseling: Secondary | ICD-10-CM | POA: Diagnosis not present

## 2018-08-15 DIAGNOSIS — E039 Hypothyroidism, unspecified: Secondary | ICD-10-CM | POA: Diagnosis not present

## 2018-08-15 DIAGNOSIS — Z1339 Encounter for screening examination for other mental health and behavioral disorders: Secondary | ICD-10-CM | POA: Diagnosis not present

## 2018-08-15 DIAGNOSIS — Z1331 Encounter for screening for depression: Secondary | ICD-10-CM | POA: Diagnosis not present

## 2018-08-16 DIAGNOSIS — E039 Hypothyroidism, unspecified: Secondary | ICD-10-CM | POA: Diagnosis not present

## 2018-08-16 DIAGNOSIS — E7800 Pure hypercholesterolemia, unspecified: Secondary | ICD-10-CM | POA: Diagnosis not present

## 2018-08-16 DIAGNOSIS — R5383 Other fatigue: Secondary | ICD-10-CM | POA: Diagnosis not present

## 2018-08-16 DIAGNOSIS — E559 Vitamin D deficiency, unspecified: Secondary | ICD-10-CM | POA: Diagnosis not present

## 2018-08-16 DIAGNOSIS — Z79899 Other long term (current) drug therapy: Secondary | ICD-10-CM | POA: Diagnosis not present

## 2018-08-20 ENCOUNTER — Ambulatory Visit (INDEPENDENT_AMBULATORY_CARE_PROVIDER_SITE_OTHER): Payer: PPO | Admitting: *Deleted

## 2018-08-20 ENCOUNTER — Telehealth: Payer: Self-pay

## 2018-08-20 DIAGNOSIS — I495 Sick sinus syndrome: Secondary | ICD-10-CM

## 2018-08-20 NOTE — Telephone Encounter (Signed)
I spoke with the patient about this.

## 2018-08-22 NOTE — Progress Notes (Signed)
Remote pacemaker transmission.   

## 2018-08-23 ENCOUNTER — Encounter: Payer: Self-pay | Admitting: Cardiology

## 2018-09-11 DIAGNOSIS — E2839 Other primary ovarian failure: Secondary | ICD-10-CM | POA: Diagnosis not present

## 2018-09-28 ENCOUNTER — Other Ambulatory Visit: Payer: Self-pay | Admitting: Internal Medicine

## 2018-09-28 ENCOUNTER — Other Ambulatory Visit: Payer: Self-pay | Admitting: *Deleted

## 2018-09-28 MED ORDER — APIXABAN 5 MG PO TABS: 5.0000 mg | ORAL_TABLET | Freq: Two times a day (BID) | ORAL | 1 refills | Status: DC

## 2018-09-28 NOTE — Telephone Encounter (Signed)
Pt last saw Dr Lovena Le 12/01/17, last labs 07/06/18 Creat 1.48, age 84, weight 61.1kg, based on specified criteria pt is on appropriate dosage of Eliquis 5mg  BID.  Will refill rx.

## 2018-09-28 NOTE — Telephone Encounter (Signed)
Eliquis 5mg  refill request received; pt is 84 yrs old, wt-61.1kg, Crea-1.48 on 07/06/18, last seen by Dr. Lovena Le on 12/01/17; will send in refill to request pharmacy.

## 2018-10-18 LAB — CUP PACEART REMOTE DEVICE CHECK
Battery Remaining Longevity: 114 mo
Battery Voltage: 2.78 V
Brady Statistic AP VP Percent: 1 %
Brady Statistic AP VS Percent: 99 %
Brady Statistic AS VP Percent: 0 %
Brady Statistic AS VS Percent: 0 %
Date Time Interrogation Session: 20191104190034
Implantable Lead Implant Date: 20141009
Implantable Lead Location: 753860
Implantable Lead Model: 1944
Implantable Lead Model: 1948
Implantable Pulse Generator Implant Date: 20141009
Lead Channel Impedance Value: 562 Ohm
Lead Channel Impedance Value: 955 Ohm
Lead Channel Pacing Threshold Amplitude: 0.75 V
Lead Channel Pacing Threshold Amplitude: 0.75 V
Lead Channel Pacing Threshold Pulse Width: 0.4 ms
Lead Channel Pacing Threshold Pulse Width: 0.4 ms
Lead Channel Setting Pacing Amplitude: 2 V
Lead Channel Setting Pacing Amplitude: 2.5 V
Lead Channel Setting Pacing Pulse Width: 0.4 ms
Lead Channel Setting Sensing Sensitivity: 5.6 mV
MDC IDC LEAD IMPLANT DT: 20141009
MDC IDC LEAD LOCATION: 753859
MDC IDC MSMT BATTERY IMPEDANCE: 254 Ohm

## 2018-10-23 ENCOUNTER — Telehealth: Payer: Self-pay

## 2018-10-23 NOTE — Telephone Encounter (Signed)
Spoke with patient reminding of SCP visit with NP on 10/29/2018 at 10 am.  Patient has on her calendar and will come to appt.

## 2018-10-29 ENCOUNTER — Encounter: Payer: Self-pay | Admitting: Adult Health

## 2018-10-29 ENCOUNTER — Telehealth: Payer: Self-pay | Admitting: Hematology and Oncology

## 2018-10-29 ENCOUNTER — Inpatient Hospital Stay: Payer: PPO | Attending: Hematology and Oncology | Admitting: Adult Health

## 2018-10-29 VITALS — BP 142/84 | HR 71 | Temp 97.5°F | Resp 18 | Ht 64.0 in | Wt 133.3 lb

## 2018-10-29 DIAGNOSIS — E7800 Pure hypercholesterolemia, unspecified: Secondary | ICD-10-CM | POA: Diagnosis not present

## 2018-10-29 DIAGNOSIS — I10 Essential (primary) hypertension: Secondary | ICD-10-CM

## 2018-10-29 DIAGNOSIS — C50411 Malignant neoplasm of upper-outer quadrant of right female breast: Secondary | ICD-10-CM | POA: Diagnosis not present

## 2018-10-29 DIAGNOSIS — Z170 Estrogen receptor positive status [ER+]: Secondary | ICD-10-CM | POA: Diagnosis not present

## 2018-10-29 DIAGNOSIS — Z803 Family history of malignant neoplasm of breast: Secondary | ICD-10-CM | POA: Diagnosis not present

## 2018-10-29 DIAGNOSIS — Z801 Family history of malignant neoplasm of trachea, bronchus and lung: Secondary | ICD-10-CM

## 2018-10-29 DIAGNOSIS — Z8673 Personal history of transient ischemic attack (TIA), and cerebral infarction without residual deficits: Secondary | ICD-10-CM

## 2018-10-29 DIAGNOSIS — R000 Tachycardia, unspecified: Secondary | ICD-10-CM | POA: Diagnosis not present

## 2018-10-29 DIAGNOSIS — Z79811 Long term (current) use of aromatase inhibitors: Secondary | ICD-10-CM | POA: Diagnosis not present

## 2018-10-29 DIAGNOSIS — Z79899 Other long term (current) drug therapy: Secondary | ICD-10-CM

## 2018-10-29 DIAGNOSIS — I4891 Unspecified atrial fibrillation: Secondary | ICD-10-CM

## 2018-10-29 DIAGNOSIS — F419 Anxiety disorder, unspecified: Secondary | ICD-10-CM | POA: Diagnosis not present

## 2018-10-29 NOTE — Progress Notes (Signed)
CLINIC:  Survivorship   REASON FOR VISIT:  Routine follow-up post-treatment for a recent history of breast cancer.  BRIEF ONCOLOGIC HISTORY:    Malignant neoplasm of upper-outer quadrant of right breast in female, estrogen receptor positive ([place])   06/11/2018 Initial Diagnosis    Screening detected right breast asymmetry posteriorly measuring 0.3 cm at 12 o'clock position.  Biopsy revealed grade 1-2 invasive ductal carcinoma ER 100%, PR 80%, Ki-67 2%, HER-2 negative, T1 a N0 stage Ia    07/13/2018 Surgery    Right lumpectomy: IDC grade 2, 0.7 cm, cancer does not involve margins, negative for lymphovascular or perineural invasion, lymph nodes not sampled, ER 100%, PR 80%, HER-2 negative, Ki-67 2%, T1B NX stage Ia    07/25/2018 Cancer Staging    Staging form: Breast, AJCC 8th Edition - Pathologic: Stage IA (pT1b, pN0, cM0, G2, ER+, PR+, HER2-) - Signed by Jessica Phlegm, NP on 07/25/2018    07/2018 -  Anti-estrogen oral therapy    Letrozole daily     INTERVAL HISTORY:  Jessica Myers presents to the [facility] today for our initial meeting to review her survivorship care plan detailing her treatment course for breast cancer, as well as monitoring long-term side effects of that treatment, education regarding health maintenance, screening, and overall wellness and health promotion.     Overall, Jessica Myers reports feeling quite well today.  She is taking Letrozole daily.  She has noted some occasional chronic shoulder pain, but otherwise denies any other issues such as hot flashes, and vaginal dryness.      REVIEW OF SYSTEMS:  Review of Systems  Constitutional: Negative for appetite change, chills, fatigue and unexpected weight change.  HENT:   Negative for hearing loss, lump/mass and trouble swallowing.   Eyes: Negative for eye problems and icterus.  Respiratory: Negative for chest tightness, cough, shortness of breath and wheezing.   Cardiovascular: Negative for chest  pain, leg swelling and palpitations.  Gastrointestinal: Positive for constipation (chronic, takes miralax). Negative for abdominal distention, abdominal pain, diarrhea, nausea and vomiting.  Endocrine: Negative for hot flashes.  Musculoskeletal: Positive for arthralgias (manageable, see interval history).  Skin: Negative for itching and rash.  Neurological: Negative for dizziness, extremity weakness, headaches and numbness.  Hematological: Negative for adenopathy. Does not bruise/bleed easily.  Psychiatric/Behavioral: Negative for depression. The patient is not nervous/anxious.   Breast: Denies any new nodularity, masses, tenderness, nipple changes, or nipple discharge.      ONCOLOGY TREATMENT TEAM:  1. Surgeon:  Dr. Donne Hazel at [facility] 2. Medical Oncologist: Dr. Lindi Adie  3. Radiation Oncologist: Dr. Lisbeth Renshaw    PAST MEDICAL/SURGICAL HISTORY:  Past Medical History:  Diagnosis Date  . A-fib (Jessica Myers)   . Anxiety   . Bradycardia    s/p PPM October 2014  . Chronic anticoagulation   . Dysrhythmia   . Headache(784.0)   . High cholesterol   . Hypertension   . Malignant neoplasm of upper-outer quadrant of right breast in female, estrogen receptor positive (Jessica Myers) 06/15/2018  . Multiple rib fractures 07/01/2016   fell down stairs   . Pulmonary contusion 06/2016   from a fall   . Stroke [facility]) September 2014   thrombolytic therapy  . Thyroid disease   . Vertical vertigo    Past Surgical History:  Procedure Laterality Date  . BREAST LUMPECTOMY WITH RADIOACTIVE SEED LOCALIZATION Right 07/13/2018   Procedure: BREAST LUMPECTOMY WITH RADIOACTIVE SEED LOCALIZATION;  Surgeon: Rolm Bookbinder, MD;  Location: [place];  Service: General;  Laterality: Right;  . CHOLECYSTECTOMY    . INSERT / REPLACE / REMOVE PACEMAKER    . PACEMAKER INSERTION    . PERMANENT PACEMAKER INSERTION N/A 07/25/2013   Procedure: PERMANENT PACEMAKER INSERTION;  Surgeon: Deboraha Sprang, MD;  Location[facility] CATH  LAB;  Service: Cardiovascular;  Laterality: N/A;  . TEE WITHOUT CARDIOVERSION N/A 07/16/2013   Procedure: TRANSESOPHAGEAL ECHOCARDIOGRAM (TEE);  Surgeon: Thayer Headings, MD;  Location: [place];  Service: Cardiovascular;  Laterality: N/A;     ALLERGIES:  Allergies  Allergen Reactions  . Biaxin [Clarithromycin] Shortness Of Breath and Rash    Unknown  . Penicillins Hives    Has patient had a PCN reaction causing immediate rash, facial/tongue/throat swelling, SOB or lightheadedness with hypotension: Yes Has patient had a PCN reaction causing severe rash involving mucus membranes or skin necrosis: No Has patient had a PCN reaction that required hospitalization No Has patient had a PCN reaction occurring within the last 10 years: No If all of the above answers are "NO", then may proceed with Cephalosporin use.    . Shellfish Allergy Nausea And Vomiting  . Sulfa Antibiotics Other (See Comments)    "bad reaction," per patient  . Celebrex [Celecoxib] Rash     CURRENT MEDICATIONS:  Outpatient Encounter Medications as of 10/29/2018  Medication Sig  . acetaminophen (TYLENOL) 500 MG tablet Take 500-1,000 mg by mouth every 6 (six) hours as needed for mild pain.  Marland Kitchen atorvastatin (LIPITOR) 10 MG tablet Take 10 mg by mouth every evening.   . carbamide peroxide ([employer] EAR DROPS) 6.5 % OTIC solution Place 5-10 drops into both ears 2 (two) times daily as needed (wax build up).  . cholecalciferol (VITAMIN D) 1000 UNITS tablet Take 1,000 Units by mouth daily.   Marland Kitchen ELIQUIS 5 MG TABS tablet TAKE 1 TABLET BY MOUTH TWICE A DAY  . flecainide (TAMBOCOR) 150 MG tablet TAKE 1/2 TABLETS (75 MG TOTAL) BY MOUTH 2 (TWO) TIMES DAILY. PLEASE KEEP UPCOMING APPT IN [place]  . letrozole (FEMARA) 2.5 MG tablet Take 1 tablet (2.5 mg total) by mouth daily.  Marland Kitchen levothyroxine (SYNTHROID, LEVOTHROID) 112 MCG tablet Take 112 mcg by mouth daily before breakfast.  . lisinopril (PRINIVIL,ZESTRIL) 10 MG tablet Take 10 mg by  mouth 2 (two) times daily.  . meclizine (ANTIVERT) 25 MG tablet Take 25 mg by mouth 3 (three) times daily as needed for dizziness.  . metoprolol succinate (TOPROL-XL) 25 MG 24 hr tablet Take 1 tablet (25 mg total) by mouth 3 (three) times daily. Please make an appt for more refills 1st attempt  . omeprazole (PRILOSEC) 20 MG capsule Take 20 mg by mouth at bedtime.   Vladimir Faster Glycol-Propyl Glycol (SYSTANE) 0.4-0.3 % SOLN Place 1 drop into both eyes 3 (three) times daily as needed (for dry eyes.).  Marland Kitchen polyethylene glycol (MIRALAX / GLYCOLAX) packet Take 17 g by mouth daily as needed (for constipation.).   Marland Kitchen simethicone (MYLICON) 696 MG chewable tablet Chew 125-250 mg by mouth every 6 (six) hours as needed for flatulence.   No facility-administered encounter medications on file as of 10/29/2018.      ONCOLOGIC FAMILY HISTORY:  Family History  Problem Relation Age of Onset  . Cancer Mother   . Breast cancer Mother   . Cancer Father   . Lung cancer Father      GENETIC COUNSELING/TESTING: Not at this time  SOCIAL HISTORY:  Social History   Socioeconomic History  . Marital status:  Married    Spouse name: leroy  . Number of children: 2  . Years of education: 28  . Highest education level: Not on file  Occupational History  . Occupation: retired  Scientific laboratory technician  . Financial resource strain: Not on file  . Food insecurity:    Worry: Not on file    Inability: Not on file  . Transportation needs:    Medical: Not on file    Non-medical: Not on file  Tobacco Use  . Smoking status: Never Smoker  . Smokeless tobacco: Never Used  Substance and Sexual Activity  . Alcohol use: No    Alcohol/week: 0.0 standard drinks  . Drug use: No  . Sexual activity: Never  Lifestyle  . Physical activity:    Days per week: Not on file    Minutes per session: Not on file  . Stress: Not on file  Relationships  . Social connections:    Talks on phone: Not on file    Gets together: Not on file     Attends religious service: Not on file    Active member of club or organization: Not on file    Attends meetings of clubs or organizations: Not on file    Relationship status: Not on file  . Intimate partner violence:    Fear of current or ex partner: Not on file    Emotionally abused: Not on file    Physically abused: Not on file    Forced sexual activity: Not on file  Other Topics Concern  . Not on file  Social History Narrative   Patient lives at home with her husband.    PHYSICAL EXAMINATION:  Vital Signs:   Vitals:   10/29/18 1006  BP: (!) 142/84  Pulse: 71  Resp: 18  Temp: (!) 97.5 F (36.4 C)  SpO2: 100%   Filed Weights   10/29/18 1006  Weight: 133 lb 4.8 oz (60.5 kg)   General: Well-nourished, well-appearing female in no acute distress.  She is unaccompanied today.   HEENT: Head is normocephalic.  Pupils equal and reactive to light. Conjunctivae clear without exudate.  Sclerae anicteric. Oral mucosa is pink, moist.  Oropharynx is pink without lesions or erythema.  Lymph: No cervical, supraclavicular, or infraclavicular lymphadenopathy noted on palpation.  Cardiovascular: Regular rate and rhythm.Marland Kitchen Respiratory: Clear to auscultation bilaterally. Chest expansion symmetric; breathing non-labored.  Breasts: s/p right lumpectomy, no sign of local recurrence, left breast benign GI: Abdomen soft and round; non-tender, non-distended. Bowel sounds normoactive.  GU: Deferred.  Neuro: No focal deficits. Steady gait.  Psych: Mood and affect normal and appropriate for situation.  Extremities: No edema. MSK: No focal spinal tenderness to palpation.  Full range of motion in bilateral upper extremities Skin: Warm and dry.  LABORATORY DATA:  None for this visit.  DIAGNOSTIC IMAGING:  None for this visit.      ASSESSMENT AND PLAN:  Ms.. Jessica Myers is a pleasant 85 y.o. female with Stage IA right breast invasive ductal carcinoma, ER+/PR+/HER2-, diagnosed in 05/2018, treated with  lumpectomy and anti-estrogen therapy with Letrozole beginning in 07/2018.  She presents to the Survivorship Clinic for our initial meeting and routine follow-up post-completion of treatment for breast cancer.    1. Stage IA right breast cancer:  Ms. Salley is continuing to recover from definitive treatment for breast cancer. She will follow-up with her medical oncologist, Dr. Lindi Adie in 6 months with history and physical exam per surveillance protocol.  She will continue her anti-estrogen  therapy with Letrozole. Thus far, she is tolerating the Letrozole well, with minimal side effects. Today, a comprehensive survivorship care plan and treatment summary was reviewed with the patient today detailing her breast cancer diagnosis, treatment course, potential late/long-term effects of treatment, appropriate follow-up care with recommendations for the future, and patient education resources.  A copy of this summary, along with a letter will be sent to the patient's primary care provider via mail/fax/In Basket message after today's visit.    2. Bone health:  Given Ms. Methvin's age/history of breast cancer and her current treatment regimen including anti-estrogen therapy with Letrozole, she is at risk for bone demineralization.  She tells me she had a bone density at her PCP office and her bone density is slightly low.  We will request these records from her PCP.  She was given education on specific activities to promote bone health.  3. Cancer screening:  Due to Ms. Ridlon's history and her age, she should receive screening for skin cancers, colon cancer, and gynecologic cancers.  The information and recommendations are listed on the patient's comprehensive care plan/treatment summary and were reviewed in detail with the patient.    4. Health maintenance and wellness promotion: Ms. Griffith was encouraged to consume 5-7 servings of fruits and vegetables per day. We reviewed the "Nutrition Rainbow" handout, as well as  the handout "Take Control of Your Health and Reduce Your Cancer Risk" from the [facility].  She was also encouraged to engage in moderate to vigorous exercise for 30 minutes per day most days of the week. We discussed the [employer] fitness program, which is designed for cancer survivors to help them become more physically fit after cancer treatments.  She was instructed to limit her alcohol consumption and continue to abstain from tobacco use.     5. Support services/counseling: It is not uncommon for this period of the patient's cancer care trajectory to be one of many emotions and stressors.  We discussed an opportunity for her to participate in the next session of [facility] ("Finding Your New Normal") support group series designed for patients after they have completed treatment.   Ms. Gomer was encouraged to take advantage of our many other support services programs, support groups, and/or counseling in coping with her new life as a cancer survivor after completing anti-cancer treatment.  She was offered support today through active listening and expressive supportive counseling.  She was given information regarding our available services and encouraged to contact me with any questions or for help enrolling in any of our support group/programs.    Dispo:   -Return to cancer center in 6 months for f/u with Dr. Lindi Adie  -Mammogram due in 05/2019 -Follow up with surgery 10/2019 -She is welcome to return back to the Survivorship Clinic at any time; no additional follow-up needed at this time.  -Consider referral back to survivorship as a long-term survivor for continued surveillance  A total of (30) minutes of face-to-face time was spent with this patient with greater than 50% of that time in counseling and care-coordination.   Jessica Phlegm, NP Survivorship Program [place] [phone]   Note: PRIMARY CARE PROVIDER Monico Blitz,  [place] [phone]

## 2018-10-29 NOTE — Telephone Encounter (Signed)
Gave avs and calendar ° °

## 2018-11-15 DIAGNOSIS — I10 Essential (primary) hypertension: Secondary | ICD-10-CM | POA: Diagnosis not present

## 2018-11-15 DIAGNOSIS — Z6824 Body mass index (BMI) 24.0-24.9, adult: Secondary | ICD-10-CM | POA: Diagnosis not present

## 2018-11-15 DIAGNOSIS — R42 Dizziness and giddiness: Secondary | ICD-10-CM | POA: Diagnosis not present

## 2018-11-15 DIAGNOSIS — M8580 Other specified disorders of bone density and structure, unspecified site: Secondary | ICD-10-CM | POA: Diagnosis not present

## 2018-11-15 DIAGNOSIS — I4891 Unspecified atrial fibrillation: Secondary | ICD-10-CM | POA: Diagnosis not present

## 2018-11-15 DIAGNOSIS — Z789 Other specified health status: Secondary | ICD-10-CM | POA: Diagnosis not present

## 2018-11-15 DIAGNOSIS — Z299 Encounter for prophylactic measures, unspecified: Secondary | ICD-10-CM | POA: Diagnosis not present

## 2018-11-19 ENCOUNTER — Ambulatory Visit (INDEPENDENT_AMBULATORY_CARE_PROVIDER_SITE_OTHER): Payer: PPO

## 2018-11-19 DIAGNOSIS — I4891 Unspecified atrial fibrillation: Secondary | ICD-10-CM

## 2018-11-19 DIAGNOSIS — I495 Sick sinus syndrome: Secondary | ICD-10-CM | POA: Diagnosis not present

## 2018-11-20 DIAGNOSIS — I7389 Other specified peripheral vascular diseases: Secondary | ICD-10-CM | POA: Diagnosis not present

## 2018-11-20 DIAGNOSIS — G9009 Other idiopathic peripheral autonomic neuropathy: Secondary | ICD-10-CM | POA: Diagnosis not present

## 2018-11-20 DIAGNOSIS — H81393 Other peripheral vertigo, bilateral: Secondary | ICD-10-CM | POA: Diagnosis not present

## 2018-11-22 LAB — CUP PACEART REMOTE DEVICE CHECK
Battery Impedance: 254 Ohm
Battery Remaining Longevity: 114 mo
Battery Voltage: 2.78 V
Brady Statistic AP VP Percent: 1 %
Brady Statistic AP VS Percent: 99 %
Brady Statistic AS VP Percent: 0 %
Brady Statistic AS VS Percent: 0 %
Date Time Interrogation Session: 20200203144303
Implantable Lead Implant Date: 20141009
Implantable Lead Implant Date: 20141009
Implantable Lead Location: 753859
Implantable Lead Location: 753860
Implantable Lead Model: 1944
Implantable Lead Model: 1948
Implantable Pulse Generator Implant Date: 20141009
Lead Channel Impedance Value: 553 Ohm
Lead Channel Impedance Value: 955 Ohm
Lead Channel Pacing Threshold Amplitude: 0.75 V
Lead Channel Pacing Threshold Amplitude: 0.75 V
Lead Channel Pacing Threshold Pulse Width: 0.4 ms
Lead Channel Setting Pacing Amplitude: 2.5 V
Lead Channel Setting Sensing Sensitivity: 5.6 mV
MDC IDC MSMT LEADCHNL RA PACING THRESHOLD PULSEWIDTH: 0.4 ms
MDC IDC SET LEADCHNL RA PACING AMPLITUDE: 2 V
MDC IDC SET LEADCHNL RV PACING PULSEWIDTH: 0.4 ms

## 2018-11-26 DIAGNOSIS — H353131 Nonexudative age-related macular degeneration, bilateral, early dry stage: Secondary | ICD-10-CM | POA: Diagnosis not present

## 2018-11-26 DIAGNOSIS — H02831 Dermatochalasis of right upper eyelid: Secondary | ICD-10-CM | POA: Diagnosis not present

## 2018-11-26 DIAGNOSIS — H04123 Dry eye syndrome of bilateral lacrimal glands: Secondary | ICD-10-CM | POA: Diagnosis not present

## 2018-11-28 NOTE — Progress Notes (Signed)
Remote pacemaker transmission.   

## 2018-12-14 ENCOUNTER — Encounter: Payer: Self-pay | Admitting: Internal Medicine

## 2018-12-14 ENCOUNTER — Ambulatory Visit: Payer: PPO | Admitting: Internal Medicine

## 2018-12-14 DIAGNOSIS — Z950 Presence of cardiac pacemaker: Secondary | ICD-10-CM

## 2018-12-14 DIAGNOSIS — I4891 Unspecified atrial fibrillation: Secondary | ICD-10-CM | POA: Diagnosis not present

## 2018-12-14 NOTE — Progress Notes (Signed)
HPI Jessica Myers returns for ongoing followup of PAF, sinus node dysfunction, stroke and HTN. In the interim she has been stable. She denies chest pain or sob. No edema. She denies weight loss. No chest pain or sob. No edema. She has not had palpitations.   Allergies  Allergen Reactions  . Biaxin [Clarithromycin] Shortness Of Breath and Rash    Unknown  . Penicillins Hives    Has patient had a PCN reaction causing immediate rash, facial/tongue/throat swelling, SOB or lightheadedness with hypotension: Yes Has patient had a PCN reaction causing severe rash involving mucus membranes or skin necrosis: No Has patient had a PCN reaction that required hospitalization No Has patient had a PCN reaction occurring within the last 10 years: No If all of the above answers are "NO", then may proceed with Cephalosporin use.    . Shellfish Allergy Nausea And Vomiting  . Sulfa Antibiotics Other (See Comments)    "bad reaction," per patient  . Celebrex [Celecoxib] Rash     Current Outpatient Medications  Medication Sig Dispense Refill  . acetaminophen (TYLENOL) 500 MG tablet Take 500-1,000 mg by mouth every 6 (six) hours as needed for mild pain.    Marland Kitchen atorvastatin (LIPITOR) 10 MG tablet Take 10 mg by mouth every evening.     . calcium carbonate (OSCAL) 1500 (600 Ca) MG TABS tablet Take by mouth 2 (two) times daily with a meal.    . carbamide peroxide ([employer]S EAR DROPS) 6.5 % OTIC solution Place 5-10 drops into both ears 2 (two) times daily as needed (wax build up).    . cholecalciferol (VITAMIN D) 1000 UNITS tablet Take 1,000 Units by mouth daily.     Marland Kitchen ELIQUIS 5 MG TABS tablet TAKE 1 TABLET BY MOUTH TWICE A DAY 180 tablet 2  . flecainide (TAMBOCOR) 150 MG tablet TAKE 1/2 TABLETS (75 MG TOTAL) BY MOUTH 2 (TWO) TIMES DAILY. PLEASE KEEP UPCOMING APPT IN FEBRUARY 90 tablet 3  . letrozole (FEMARA) 2.5 MG tablet Take 1 tablet (2.5 mg total) by mouth daily. 90 tablet 3  . levothyroxine (SYNTHROID,  LEVOTHROID) 112 MCG tablet Take 112 mcg by mouth daily before breakfast.  2  . lisinopril (PRINIVIL,ZESTRIL) 10 MG tablet Take 10 mg by mouth 2 (two) times daily.    . meclizine (ANTIVERT) 25 MG tablet Take 25 mg by mouth 3 (three) times daily as needed for dizziness.    . metoprolol succinate (TOPROL-XL) 25 MG 24 hr tablet Take 1 tablet (25 mg total) by mouth 3 (three) times daily. Please make an appt for more refills 1st attempt 270 tablet 3  . omeprazole (PRILOSEC) 20 MG capsule Take 20 mg by mouth at bedtime.     Vladimir Faster Glycol-Propyl Glycol (SYSTANE) 0.4-0.3 % SOLN Place 1 drop into both eyes 3 (three) times daily as needed (for dry eyes.).    Marland Kitchen polyethylene glycol (MIRALAX / GLYCOLAX) packet Take 17 g by mouth daily as needed (for constipation.).     Marland Kitchen simethicone (MYLICON) 696 MG chewable tablet Chew 125-250 mg by mouth every 6 (six) hours as needed for flatulence.     No current facility-administered medications for this visit.      Past Medical History:  Diagnosis Date  . A-fib ([place])   . Anxiety   . Bradycardia    s/p PPM October 2014  . Chronic anticoagulation   . Dysrhythmia   . Headache(784.0)   . High cholesterol   . Hypertension   .  Malignant neoplasm of upper-outer quadrant of right breast in female, estrogen receptor positive ([place]) 06/15/2018  . Multiple rib fractures 07/01/2016   fell down stairs   . Pulmonary contusion 06/2016   from a fall   . Stroke [facility]) September 2014   thrombolytic therapy  . Thyroid disease   . Vertical vertigo     ROS:   All systems reviewed and negative except as noted in the HPI.   Past Surgical History:  Procedure Laterality Date  . BREAST LUMPECTOMY WITH RADIOACTIVE SEED LOCALIZATION Right 07/13/2018   Procedure: BREAST LUMPECTOMY WITH RADIOACTIVE SEED LOCALIZATION;  Surgeon: Rolm Bookbinder, MD;  Location: [place];  Service: General;  Laterality: Right;  . CHOLECYSTECTOMY    . INSERT / REPLACE / REMOVE PACEMAKER    .  PACEMAKER INSERTION    . PERMANENT PACEMAKER INSERTION N/A 07/25/2013   Procedure: PERMANENT PACEMAKER INSERTION;  Surgeon: Deboraha Sprang, MD;  Location: [facility] CATH LAB;  Service: Cardiovascular;  Laterality: N/A;  . TEE WITHOUT CARDIOVERSION N/A 07/16/2013   Procedure: TRANSESOPHAGEAL ECHOCARDIOGRAM (TEE);  Surgeon: Thayer Headings, MD;  Location: [facility] ENDOSCOPY;  Service: Cardiovascular;  Laterality: N/A;     Family History  Problem Relation Age of Onset  . Cancer Mother   . Breast cancer Mother   . Cancer Father   . Lung cancer Father      Social History   Socioeconomic History  . Marital status: Married    Spouse name: leroy  . Number of children: 2  . Years of education: 80  . Highest education level: Not on file  Occupational History  . Occupation: retired  Scientific laboratory technician  . Financial resource strain: Not on file  . Food insecurity:    Worry: Not on file    Inability: Not on file  . Transportation needs:    Medical: Not on file    Non-medical: Not on file  Tobacco Use  . Smoking status: Never Smoker  . Smokeless tobacco: Never Used  Substance and Sexual Activity  . Alcohol use: No    Alcohol/week: 0.0 standard drinks  . Drug use: No  . Sexual activity: Never  Lifestyle  . Physical activity:    Days per week: Not on file    Minutes per session: Not on file  . Stress: Not on file  Relationships  . Social connections:    Talks on phone: Not on file    Gets together: Not on file    Attends religious service: Not on file    Active member of club or organization: Not on file    Attends meetings of clubs or organizations: Not on file    Relationship status: Not on file  . Intimate partner violence:    Fear of current or ex partner: Not on file    Emotionally abused: Not on file    Physically abused: Not on file    Forced sexual activity: Not on file  Other Topics Concern  . Not on file  Social History Narrative   Patient lives at home with her husband.      BP 132/86 (BP Location: Right Arm)   Pulse 87   Ht 5\' 4"  (1.626 m)   Wt 135 lb (61.2 kg)   SpO2 96%   BMI 23.17 kg/m   Physical Exam:  Well appearing NAD HEENT: Unremarkable Neck:  6 cm JVD, no thyromegally Lymphatics:  No adenopathy Back:  No CVA tenderness Lungs:  Clear with no wheezes HEART:  Regular rate rhythm, no murmurs, no rubs, no clicks Abd:  soft, positive bowel sounds, no organomegally, no rebound, no guarding Ext:  2 plus pulses, no edema, no cyanosis, no clubbing Skin:  No rashes no nodules Neuro:  CN II through XII intact, motor grossly intact   DEVICE  Normal device function.  See PaceArt for details.   Assess/Plan: 1. Sinus node dysfunction - she is maintaining NSR. 2. PPM - her interogation demonstrates normal DDD function. 3. PAF - since her last interogation, she has not had atrial fib. She will continue her low dose flecainide which is working nicely to control her symptoms.   Mikle Bosworth.D.

## 2018-12-14 NOTE — Patient Instructions (Signed)
Medication Instructions:  Your physician recommends that you continue on your current medications as directed. Please refer to the Current Medication list given to you today.  If you need a refill on your cardiac medications before your next appointment, please call your pharmacy.   Lab work: NONE  If you have labs (blood work) drawn today and your tests are completely normal, you will receive your results only by: . MyChart Message (if you have MyChart) OR . A paper copy in the mail If you have any lab test that is abnormal or we need to change your treatment, we will call you to review the results.  Testing/Procedures: NONE   Follow-Up: At [facility], you and your health needs are our priority.  As part of our continuing mission to provide you with exceptional heart care, we have created designated Provider Care Teams.  These Care Teams include your primary Cardiologist (physician) and Advanced Practice Providers (APPs -  Physician Assistants and Nurse Practitioners) who all work together to provide you with the care you need, when you need it. You will need a follow up appointment in 1 years.  Please call our office 2 months in advance to schedule this appointment.  You may see None or one of the following Advanced Practice Providers on your designated Care Team:   Amber Seiler, NP . Renee Ursuy, PA-C  Any Other Special Instructions Will Be Listed Below (If Applicable). Thank you for choosing [facility]!     

## 2018-12-19 LAB — CUP PACEART INCLINIC DEVICE CHECK
Date Time Interrogation Session: 20200304162215
Implantable Lead Implant Date: 20141009
Implantable Lead Implant Date: 20141009
Implantable Lead Location: 753860
Implantable Lead Model: 1944
Implantable Lead Model: 1948
Implantable Pulse Generator Implant Date: 20141009
MDC IDC LEAD LOCATION: 753859

## 2019-01-22 ENCOUNTER — Other Ambulatory Visit: Payer: Self-pay | Admitting: Internal Medicine

## 2019-01-22 DIAGNOSIS — I4891 Unspecified atrial fibrillation: Secondary | ICD-10-CM

## 2019-01-26 ENCOUNTER — Other Ambulatory Visit: Payer: Self-pay | Admitting: Internal Medicine

## 2019-02-14 DIAGNOSIS — Z299 Encounter for prophylactic measures, unspecified: Secondary | ICD-10-CM | POA: Diagnosis not present

## 2019-02-14 DIAGNOSIS — I10 Essential (primary) hypertension: Secondary | ICD-10-CM | POA: Diagnosis not present

## 2019-02-14 DIAGNOSIS — I4891 Unspecified atrial fibrillation: Secondary | ICD-10-CM | POA: Diagnosis not present

## 2019-02-14 DIAGNOSIS — C50911 Malignant neoplasm of unspecified site of right female breast: Secondary | ICD-10-CM | POA: Diagnosis not present

## 2019-02-14 DIAGNOSIS — Z6824 Body mass index (BMI) 24.0-24.9, adult: Secondary | ICD-10-CM | POA: Diagnosis not present

## 2019-02-14 DIAGNOSIS — N183 Chronic kidney disease, stage 3 (moderate): Secondary | ICD-10-CM | POA: Diagnosis not present

## 2019-02-25 ENCOUNTER — Other Ambulatory Visit: Payer: Self-pay

## 2019-02-25 ENCOUNTER — Ambulatory Visit (INDEPENDENT_AMBULATORY_CARE_PROVIDER_SITE_OTHER): Payer: PPO | Admitting: *Deleted

## 2019-02-25 DIAGNOSIS — I4891 Unspecified atrial fibrillation: Secondary | ICD-10-CM

## 2019-02-25 DIAGNOSIS — I495 Sick sinus syndrome: Secondary | ICD-10-CM

## 2019-02-26 LAB — CUP PACEART REMOTE DEVICE CHECK
Battery Impedance: 327 Ohm
Battery Remaining Longevity: 104 mo
Battery Voltage: 2.79 V
Brady Statistic AP VP Percent: 1 %
Brady Statistic AP VS Percent: 99 %
Brady Statistic AS VP Percent: 0 %
Brady Statistic AS VS Percent: 0 %
Date Time Interrogation Session: 20200511130826
Implantable Lead Implant Date: 20141009
Implantable Lead Implant Date: 20141009
Implantable Lead Location: 753859
Implantable Lead Location: 753860
Implantable Lead Model: 1944
Implantable Lead Model: 1948
Implantable Pulse Generator Implant Date: 20141009
Lead Channel Impedance Value: 543 Ohm
Lead Channel Impedance Value: 918 Ohm
Lead Channel Pacing Threshold Amplitude: 0.625 V
Lead Channel Pacing Threshold Amplitude: 0.75 V
Lead Channel Pacing Threshold Pulse Width: 0.4 ms
Lead Channel Pacing Threshold Pulse Width: 0.4 ms
Lead Channel Setting Pacing Amplitude: 2 V
Lead Channel Setting Pacing Amplitude: 2.5 V
Lead Channel Setting Pacing Pulse Width: 0.46 ms
Lead Channel Setting Sensing Sensitivity: 5.6 mV

## 2019-03-07 NOTE — Progress Notes (Signed)
Remote pacemaker transmission.   

## 2019-03-18 ENCOUNTER — Other Ambulatory Visit: Payer: Self-pay

## 2019-03-18 MED ORDER — APIXABAN 5 MG PO TABS: 5.0000 mg | ORAL_TABLET | Freq: Two times a day (BID) | ORAL | 2 refills | Status: DC

## 2019-03-18 NOTE — Telephone Encounter (Signed)
Refilled eliquis 5 mg bid

## 2019-04-23 ENCOUNTER — Telehealth: Payer: Self-pay | Admitting: Hematology and Oncology

## 2019-04-23 NOTE — Telephone Encounter (Signed)
I left a message regarding visit °

## 2019-04-23 NOTE — Assessment & Plan Note (Signed)
07/13/2018: Right lumpectomy: IDC grade 2, 0.7 cm, cancer does not involve margins, negative for lymphovascular or perineural invasion, lymph nodes not sampled, ER 100%, PR 80%, HER-2 negative, Ki-67 2%, T1B NX stage Ia  Pathology counseling: I discussed the final pathology report of the patient provided  a copy of this report. I discussed the margins   We also discussed the final staging along with previously performed ER/PR and HER-2/neu testing.  Current treatment: Adjuvant antiestrogen therapy with letrozole 2.5 mg daily x5 years started Oct 2019 Letrozole Toxicities:  Breast cancer Surveillance:  1. Mammogram: Aug 2020 2. Dexa Scan 09/11/18: T score -1.8    RTC in 1 year

## 2019-04-26 ENCOUNTER — Telehealth: Payer: Self-pay | Admitting: Hematology and Oncology

## 2019-04-27 NOTE — Progress Notes (Signed)
HEMATOLOGY-ONCOLOGY DOXIMITY VISIT PROGRESS NOTE  I connected with Jessica Myers on 04/29/2019 at 11:00 AM EDT by Doximity video conference and verified that I am speaking with the correct person using two identifiers.  I discussed the limitations, risks, security and privacy concerns of performing an evaluation and management service by Doximity and the availability of in person appointments.  I also discussed with the patient that there may be a patient responsible charge related to this service. The patient expressed understanding and agreed to proceed.  Patient's Location: Home Physician Location: Clinic  CHIEF COMPLIANT: Follow-up of left breast cancer on letrozole  INTERVAL HISTORY: Jessica Myers is a 85 y.o. female with above-mentioned history of left breast cancer treated with lumpectomy who is currently on anti-estrogen therapy with letrozole. I last saw her 9 months ago and in the interim she was seen by Wilber Bihari, NP for [facility]. Bone density scan on 09/11/18 showed a T-score of -1.8. She presents over Doximity today for annual follow-up.   Oncology History  Malignant neoplasm of upper-outer quadrant of right breast in female, estrogen receptor positive ([place])  06/11/2018 Initial Diagnosis   Screening detected right breast asymmetry posteriorly measuring 0.3 cm at 12 o'clock position.  Biopsy revealed grade 1-2 invasive ductal carcinoma ER 100%, PR 80%, Ki-67 2%, HER-2 negative, T1 a N0 stage Ia   07/13/2018 Surgery   Right lumpectomy: IDC grade 2, 0.7 cm, cancer does not involve margins, negative for lymphovascular or perineural invasion, lymph nodes not sampled, ER 100%, PR 80%, HER-2 negative, Ki-67 2%, T1B NX stage Ia   07/25/2018 Cancer Staging   Staging form: Breast, AJCC 8th Edition - Pathologic: Stage IA (pT1b, pN0, cM0, G2, ER+, PR+, HER2-) - Signed by Gardenia Phlegm, NP on 07/25/2018   07/2018 -  Anti-estrogen oral therapy   Letrozole daily    REVIEW OF SYSTEMS:   Constitutional: Denies fevers, chills or abnormal weight loss Eyes: Denies blurriness of vision Ears, nose, mouth, throat, and face: Denies mucositis or sore throat Respiratory: Denies cough, dyspnea or wheezes Cardiovascular: Denies palpitation, chest discomfort Gastrointestinal:  Denies nausea, heartburn or change in bowel habits Skin: Denies abnormal skin rashes Lymphatics: Denies new lymphadenopathy or easy bruising Neurological:Denies numbness, tingling or new weaknesses Behavioral/Psych: Mood is stable, no new changes  Extremities: No lower extremity edema Breast: denies any pain or lumps or nodules in either breasts All other systems were reviewed with the patient and are negative.  Observations/Objective:  There were no vitals filed for this visit. There is no height or weight on file to calculate BMI.  I have reviewed the data as listed CMP Latest Ref Rng & Units 07/06/2018 06/20/2018 12/13/2017  Glucose 70 - 99 mg/dL 98 83 85  BUN 8 - 23 mg/dL 25(H) 25(H) 19  Creatinine 0.44 - 1.00 mg/dL 1.48(H) 1.34(H) 1.24(H)  Sodium 135 - 145 mmol/L 139 141 139  Potassium 3.5 - 5.1 mmol/L 4.7 4.9 4.3  Chloride 98 - 111 mmol/L 105 106 103  CO2 22 - 32 mmol/L _0 Calcium 8.9 - 10.3 mg/dL 9.3 10.1 9.7  Total Protein 6.5 - 8.1 g/dL - 7.3 -  Total Bilirubin 0.3 - 1.2 mg/dL - 0.8 -  Alkaline Phos 38 - 126 U/L - 83 -  AST 15 - 41 U/L - 17 -  ALT 0 - 44 U/L - 11 -    Lab Results  Component Value Date   WBC 5.9 07/06/2018   HGB 12.3  07/06/2018   HCT 38.7 07/06/2018   MCV 98.7 07/06/2018   PLT 170 07/06/2018   NEUTROABS 3.0 06/20/2018      Assessment Plan:  Malignant neoplasm of upper-outer quadrant of right breast in female, estrogen receptor positive ([place]) 07/13/2018: Right lumpectomy: IDC grade 2, 0.7 cm, cancer does not involve margins, negative for lymphovascular or perineural invasion, lymph nodes not sampled, ER 100%, PR 80%, HER-2 negative, Ki-67  2%, T1B NX stage Ia  Pathology counseling: I discussed the final pathology report of the patient provided  a copy of this report. I discussed the margins   We also discussed the final staging along with previously performed ER/PR and HER-2/neu testing.  Current treatment: Adjuvant antiestrogen therapy with letrozole 2.5 mg daily x5 years started Oct 2019 Letrozole Toxicities: No side effects Enjoys gardening.  Breast cancer Surveillance:  1. Mammogram: Aug 2020 2. Dexa Scan 09/11/18: T score -1.8    RTC in 1 year  I discussed the assessment and treatment plan with the patient. The patient was provided an opportunity to ask questions and all were answered. The patient agreed with the plan and demonstrated an understanding of the instructions. The patient was advised to call back or seek an in-person evaluation if the symptoms worsen or if the condition fails to improve as anticipated.   I provided 15 minutes of face-to-face Doximity time during this encounter.    Rulon Eisenmenger, MD 04/29/2019   I, Molly Dorshimer, am acting as scribe for Nicholas Lose, MD.  I have reviewed the above documentation for accuracy and completeness, and I agree with the above.

## 2019-04-29 ENCOUNTER — Inpatient Hospital Stay: Payer: PPO | Attending: Hematology and Oncology | Admitting: Hematology and Oncology

## 2019-04-29 DIAGNOSIS — Z170 Estrogen receptor positive status [ER+]: Secondary | ICD-10-CM | POA: Diagnosis not present

## 2019-04-29 DIAGNOSIS — Z79810 Long term (current) use of selective estrogen receptor modulators (SERMs): Secondary | ICD-10-CM

## 2019-04-29 DIAGNOSIS — C50411 Malignant neoplasm of upper-outer quadrant of right female breast: Secondary | ICD-10-CM | POA: Diagnosis not present

## 2019-04-29 MED ORDER — LETROZOLE 2.5 MG PO TABS: 2.5000 mg | ORAL_TABLET | Freq: Every day | ORAL | 3 refills | Status: DC

## 2019-04-30 ENCOUNTER — Telehealth: Payer: Self-pay | Admitting: Hematology and Oncology

## 2019-04-30 NOTE — Telephone Encounter (Signed)
I talk with patient regarding schedule  

## 2019-04-30 NOTE — Telephone Encounter (Signed)
I could not reach patient I will mail  °

## 2019-05-21 DIAGNOSIS — Z299 Encounter for prophylactic measures, unspecified: Secondary | ICD-10-CM | POA: Diagnosis not present

## 2019-05-21 DIAGNOSIS — I10 Essential (primary) hypertension: Secondary | ICD-10-CM | POA: Diagnosis not present

## 2019-05-21 DIAGNOSIS — Z6824 Body mass index (BMI) 24.0-24.9, adult: Secondary | ICD-10-CM | POA: Diagnosis not present

## 2019-05-21 DIAGNOSIS — C50911 Malignant neoplasm of unspecified site of right female breast: Secondary | ICD-10-CM | POA: Diagnosis not present

## 2019-05-21 DIAGNOSIS — N183 Chronic kidney disease, stage 3 (moderate): Secondary | ICD-10-CM | POA: Diagnosis not present

## 2019-05-21 DIAGNOSIS — I4891 Unspecified atrial fibrillation: Secondary | ICD-10-CM | POA: Diagnosis not present

## 2019-05-27 ENCOUNTER — Telehealth: Payer: Self-pay | Admitting: Cardiology

## 2019-05-27 ENCOUNTER — Ambulatory Visit (INDEPENDENT_AMBULATORY_CARE_PROVIDER_SITE_OTHER): Payer: PPO | Admitting: *Deleted

## 2019-05-27 DIAGNOSIS — I495 Sick sinus syndrome: Secondary | ICD-10-CM

## 2019-05-27 LAB — CUP PACEART REMOTE DEVICE CHECK
Battery Impedance: 327 Ohm
Battery Remaining Longevity: 105 mo
Battery Voltage: 2.78 V
Brady Statistic AP VP Percent: 2 %
Brady Statistic AP VS Percent: 98 %
Brady Statistic AS VP Percent: 0 %
Brady Statistic AS VS Percent: 0 %
Date Time Interrogation Session: 20200810134453
Implantable Lead Implant Date: 20141009
Implantable Lead Implant Date: 20141009
Implantable Lead Location: 753859
Implantable Lead Location: 753860
Implantable Lead Model: 1944
Implantable Lead Model: 1948
Implantable Pulse Generator Implant Date: 20141009
Lead Channel Impedance Value: 552 Ohm
Lead Channel Impedance Value: 920 Ohm
Lead Channel Pacing Threshold Amplitude: 0.75 V
Lead Channel Pacing Threshold Amplitude: 0.875 V
Lead Channel Pacing Threshold Pulse Width: 0.4 ms
Lead Channel Pacing Threshold Pulse Width: 0.4 ms
Lead Channel Setting Pacing Amplitude: 2 V
Lead Channel Setting Pacing Amplitude: 2.5 V
Lead Channel Setting Pacing Pulse Width: 0.4 ms
Lead Channel Setting Sensing Sensitivity: 5.6 mV

## 2019-05-27 NOTE — Telephone Encounter (Signed)
Pt called and stated that she sent her scheduled remote transmission w/ her home monitor. She wanted results before she left for vacation. Call routed to [place].

## 2019-05-27 NOTE — Telephone Encounter (Signed)
Spoke with patient transmission showed no events.

## 2019-06-04 ENCOUNTER — Encounter: Payer: Self-pay | Admitting: Cardiology

## 2019-06-04 NOTE — Progress Notes (Signed)
Remote pacemaker transmission.   

## 2019-06-10 DIAGNOSIS — Z9889 Other specified postprocedural states: Secondary | ICD-10-CM | POA: Diagnosis not present

## 2019-06-10 DIAGNOSIS — C50411 Malignant neoplasm of upper-outer quadrant of right female breast: Secondary | ICD-10-CM | POA: Diagnosis not present

## 2019-07-31 DIAGNOSIS — Z6823 Body mass index (BMI) 23.0-23.9, adult: Secondary | ICD-10-CM | POA: Diagnosis not present

## 2019-07-31 DIAGNOSIS — M549 Dorsalgia, unspecified: Secondary | ICD-10-CM | POA: Diagnosis not present

## 2019-07-31 DIAGNOSIS — I4891 Unspecified atrial fibrillation: Secondary | ICD-10-CM | POA: Diagnosis not present

## 2019-07-31 DIAGNOSIS — I10 Essential (primary) hypertension: Secondary | ICD-10-CM | POA: Diagnosis not present

## 2019-07-31 DIAGNOSIS — N183 Chronic kidney disease, stage 3 unspecified: Secondary | ICD-10-CM | POA: Diagnosis not present

## 2019-07-31 DIAGNOSIS — Z299 Encounter for prophylactic measures, unspecified: Secondary | ICD-10-CM | POA: Diagnosis not present

## 2019-08-20 DIAGNOSIS — I10 Essential (primary) hypertension: Secondary | ICD-10-CM | POA: Diagnosis not present

## 2019-08-20 DIAGNOSIS — Z1211 Encounter for screening for malignant neoplasm of colon: Secondary | ICD-10-CM | POA: Diagnosis not present

## 2019-08-20 DIAGNOSIS — Z79899 Other long term (current) drug therapy: Secondary | ICD-10-CM | POA: Diagnosis not present

## 2019-08-20 DIAGNOSIS — I4891 Unspecified atrial fibrillation: Secondary | ICD-10-CM | POA: Diagnosis not present

## 2019-08-20 DIAGNOSIS — Z6823 Body mass index (BMI) 23.0-23.9, adult: Secondary | ICD-10-CM | POA: Diagnosis not present

## 2019-08-20 DIAGNOSIS — E7800 Pure hypercholesterolemia, unspecified: Secondary | ICD-10-CM | POA: Diagnosis not present

## 2019-08-20 DIAGNOSIS — Z7189 Other specified counseling: Secondary | ICD-10-CM | POA: Diagnosis not present

## 2019-08-20 DIAGNOSIS — Z299 Encounter for prophylactic measures, unspecified: Secondary | ICD-10-CM | POA: Diagnosis not present

## 2019-08-20 DIAGNOSIS — Z0000 Encounter for general adult medical examination without abnormal findings: Secondary | ICD-10-CM | POA: Diagnosis not present

## 2019-08-20 DIAGNOSIS — Z1331 Encounter for screening for depression: Secondary | ICD-10-CM | POA: Diagnosis not present

## 2019-08-20 DIAGNOSIS — Z1339 Encounter for screening examination for other mental health and behavioral disorders: Secondary | ICD-10-CM | POA: Diagnosis not present

## 2019-08-20 DIAGNOSIS — R5383 Other fatigue: Secondary | ICD-10-CM | POA: Diagnosis not present

## 2019-08-26 ENCOUNTER — Ambulatory Visit (INDEPENDENT_AMBULATORY_CARE_PROVIDER_SITE_OTHER): Payer: PPO | Admitting: *Deleted

## 2019-08-26 DIAGNOSIS — R55 Syncope and collapse: Secondary | ICD-10-CM

## 2019-08-26 DIAGNOSIS — I4891 Unspecified atrial fibrillation: Secondary | ICD-10-CM

## 2019-08-28 ENCOUNTER — Telehealth: Payer: Self-pay

## 2019-08-28 NOTE — Telephone Encounter (Signed)
Left message for patient to remind of missed remote transmission. LL 

## 2019-10-02 ENCOUNTER — Other Ambulatory Visit: Payer: Self-pay

## 2019-10-02 NOTE — Progress Notes (Signed)
Remote pacemaker transmission.   

## 2019-11-13 ENCOUNTER — Other Ambulatory Visit: Payer: Self-pay | Admitting: Internal Medicine

## 2019-11-17 DIAGNOSIS — I4891 Unspecified atrial fibrillation: Secondary | ICD-10-CM | POA: Diagnosis not present

## 2019-11-17 DIAGNOSIS — E7800 Pure hypercholesterolemia, unspecified: Secondary | ICD-10-CM | POA: Diagnosis not present

## 2019-11-20 DIAGNOSIS — E7800 Pure hypercholesterolemia, unspecified: Secondary | ICD-10-CM | POA: Diagnosis not present

## 2019-11-20 DIAGNOSIS — I10 Essential (primary) hypertension: Secondary | ICD-10-CM | POA: Diagnosis not present

## 2019-11-20 DIAGNOSIS — Z299 Encounter for prophylactic measures, unspecified: Secondary | ICD-10-CM | POA: Diagnosis not present

## 2019-11-20 DIAGNOSIS — Z6823 Body mass index (BMI) 23.0-23.9, adult: Secondary | ICD-10-CM | POA: Diagnosis not present

## 2019-11-20 DIAGNOSIS — Z789 Other specified health status: Secondary | ICD-10-CM | POA: Diagnosis not present

## 2019-11-20 DIAGNOSIS — I4891 Unspecified atrial fibrillation: Secondary | ICD-10-CM | POA: Diagnosis not present

## 2019-11-20 DIAGNOSIS — C50911 Malignant neoplasm of unspecified site of right female breast: Secondary | ICD-10-CM | POA: Diagnosis not present

## 2019-11-25 ENCOUNTER — Ambulatory Visit (INDEPENDENT_AMBULATORY_CARE_PROVIDER_SITE_OTHER): Payer: PPO | Admitting: *Deleted

## 2019-11-25 DIAGNOSIS — R55 Syncope and collapse: Secondary | ICD-10-CM | POA: Diagnosis not present

## 2019-11-25 LAB — CUP PACEART REMOTE DEVICE CHECK
Battery Impedance: 400 Ohm
Battery Remaining Longevity: 98 mo
Battery Voltage: 2.77 V
Brady Statistic AP VP Percent: 5 %
Brady Statistic AP VS Percent: 87 %
Brady Statistic AS VP Percent: 2 %
Brady Statistic AS VS Percent: 5 %
Date Time Interrogation Session: 20210208100131
Implantable Lead Implant Date: 20141009
Implantable Lead Implant Date: 20141009
Implantable Lead Location: 753859
Implantable Lead Location: 753860
Implantable Lead Model: 1944
Implantable Lead Model: 1948
Implantable Pulse Generator Implant Date: 20141009
Lead Channel Impedance Value: 552 Ohm
Lead Channel Impedance Value: 947 Ohm
Lead Channel Pacing Threshold Amplitude: 0.75 V
Lead Channel Pacing Threshold Amplitude: 0.875 V
Lead Channel Pacing Threshold Pulse Width: 0.4 ms
Lead Channel Pacing Threshold Pulse Width: 0.4 ms
Lead Channel Setting Pacing Amplitude: 2 V
Lead Channel Setting Pacing Amplitude: 2.5 V
Lead Channel Setting Pacing Pulse Width: 0.4 ms
Lead Channel Setting Sensing Sensitivity: 5.6 mV

## 2019-11-26 NOTE — Progress Notes (Signed)
PPM Remote  

## 2019-11-28 ENCOUNTER — Encounter: Payer: Self-pay | Admitting: Internal Medicine

## 2019-11-28 NOTE — Telephone Encounter (Signed)
error 

## 2019-12-03 ENCOUNTER — Other Ambulatory Visit: Payer: Self-pay | Admitting: Internal Medicine

## 2019-12-03 NOTE — Telephone Encounter (Signed)
This is a [place] pt.  °

## 2019-12-17 ENCOUNTER — Ambulatory Visit: Payer: PPO | Admitting: Internal Medicine

## 2019-12-17 ENCOUNTER — Other Ambulatory Visit: Payer: Self-pay

## 2019-12-17 ENCOUNTER — Encounter: Payer: Self-pay | Admitting: Internal Medicine

## 2019-12-17 VITALS — BP 129/79 | HR 86 | Temp 97.7°F | Ht 65.0 in | Wt 134.0 lb

## 2019-12-17 DIAGNOSIS — I495 Sick sinus syndrome: Secondary | ICD-10-CM | POA: Diagnosis not present

## 2019-12-17 DIAGNOSIS — Z950 Presence of cardiac pacemaker: Secondary | ICD-10-CM

## 2019-12-17 DIAGNOSIS — I4891 Unspecified atrial fibrillation: Secondary | ICD-10-CM | POA: Diagnosis not present

## 2019-12-17 MED ORDER — METOPROLOL SUCCINATE ER 25 MG PO TB24: 25.0000 mg | ORAL_TABLET | Freq: Two times a day (BID) | ORAL | 11 refills | Status: DC

## 2019-12-17 NOTE — Patient Instructions (Signed)
Medication Instructions:  Your physician recommends that you continue on your current medications as directed. Please refer to the Current Medication list given to you today. Decrease Toprol XL to 25 mg Two Times Daily  *If you need a refill on your cardiac medications before your next appointment, please call your pharmacy*   Lab Work: NONE  If you have labs (blood work) drawn today and your tests are completely normal, you will receive your results only by: Marland Kitchen MyChart Message (if you have MyChart) OR . A paper copy in the mail If you have any lab test that is abnormal or we need to change your treatment, we will call you to review the results.   Testing/Procedures: NONE    Follow-Up: At [facility], you and your health needs are our priority.  As part of our continuing mission to provide you with exceptional heart care, we have created designated Provider Care Teams.  These Care Teams include your primary Cardiologist (physician) and Advanced Practice Providers (APPs -  Physician Assistants and Nurse Practitioners) who all work together to provide you with the care you need, when you need it.  We recommend signing up for the patient portal called "MyChart".  Sign up information is provided on this After Visit Summary.  MyChart is used to connect with patients for Virtual Visits (Telemedicine).  Patients are able to view lab/test results, encounter notes, upcoming appointments, etc.  Non-urgent messages can be sent to your provider as well.   To learn more about what you can do with MyChart, go to [email or web address].    Your next appointment:   1 year(s)  The format for your next appointment:   In Person  Provider:   Cristopher Peru, MD   Other Instructions Thank you for choosing [facility]!

## 2019-12-17 NOTE — Progress Notes (Signed)
HPI Jessica Myers returns today for followup. She is a pleasant 86 yo woman with a h/o sinus node dysfunction, s/p PPM insertion, PAF, and HTN. In the interim, she has done well except that she has occaisional lightheaded spells. No syncope.  Allergies  Allergen Reactions  . Biaxin [Clarithromycin] Shortness Of Breath and Rash    Unknown  . Penicillins Hives    Has patient had a PCN reaction causing immediate rash, facial/tongue/throat swelling, SOB or lightheadedness with hypotension: Yes Has patient had a PCN reaction causing severe rash involving mucus membranes or skin necrosis: No Has patient had a PCN reaction that required hospitalization No Has patient had a PCN reaction occurring within the last 10 years: No If all of the above answers are "NO", then may proceed with Cephalosporin use.    . Shellfish Allergy Nausea And Vomiting  . Sulfa Antibiotics Other (See Comments)    "bad reaction," per patient  . Celebrex [Celecoxib] Rash     Current Outpatient Medications  Medication Sig Dispense Refill  . acetaminophen (TYLENOL) 500 MG tablet Take 500-1,000 mg by mouth every 6 (six) hours as needed for mild pain.    Marland Kitchen atorvastatin (LIPITOR) 10 MG tablet Take 10 mg by mouth every evening.     . calcium carbonate (OSCAL) 1500 (600 Ca) MG TABS tablet Take by mouth 2 (two) times daily with a meal.    . carbamide peroxide ([employer]S EAR DROPS) 6.5 % OTIC solution Place 5-10 drops into both ears 2 (two) times daily as needed (wax build up).    . cholecalciferol (VITAMIN D) 1000 UNITS tablet Take 1,000 Units by mouth daily.     Marland Kitchen ELIQUIS 5 MG TABS tablet TAKE 1 TABLET BY MOUTH TWICE A DAY 180 tablet 2  . flecainide (TAMBOCOR) 150 MG tablet TAKE 1/2 TABLETS (75 MG TOTAL) BY MOUTH 2 (TWO) TIMES DAILY. PLEASE KEEP UPCOMING APPT IN FEBRUARY 90 tablet 3  . letrozole (FEMARA) 2.5 MG tablet Take 1 tablet (2.5 mg total) by mouth daily. 90 tablet 3  . levothyroxine (SYNTHROID, LEVOTHROID) 112 MCG  tablet Take 112 mcg by mouth daily before breakfast.  2  . lisinopril (PRINIVIL,ZESTRIL) 10 MG tablet Take 10 mg by mouth 2 (two) times daily.    . meclizine (ANTIVERT) 25 MG tablet Take 25 mg by mouth 3 (three) times daily as needed for dizziness.    . metoprolol succinate (TOPROL-XL) 25 MG 24 hr tablet TAKE 1 TABLET BY MOUTH 3 TIMES DAILY 270 tablet 3  . omeprazole (PRILOSEC) 20 MG capsule Take 20 mg by mouth at bedtime.     Vladimir Faster Glycol-Propyl Glycol (SYSTANE) 0.4-0.3 % SOLN Place 1 drop into both eyes 3 (three) times daily as needed (for dry eyes.).    Marland Kitchen polyethylene glycol (MIRALAX / GLYCOLAX) packet Take 17 g by mouth daily as needed (for constipation.).     Marland Kitchen simethicone (MYLICON) 0000000 MG chewable tablet Chew 125-250 mg by mouth every 6 (six) hours as needed for flatulence.     No current facility-administered medications for this visit.     Past Medical History:  Diagnosis Date  . A-fib ([place])   . Anxiety   . Bradycardia    s/p PPM October 2014  . Chronic anticoagulation   . Dysrhythmia   . Headache(784.0)   . High cholesterol   . Hypertension   . Malignant neoplasm of upper-outer quadrant of right breast in female, estrogen receptor positive ([place]) 06/15/2018  .  Multiple rib fractures 07/01/2016   fell down stairs   . Pulmonary contusion 06/2016   from a fall   . Stroke [facility]) September 2014   thrombolytic therapy  . Thyroid disease   . Vertical vertigo     ROS:   All systems reviewed and negative except as noted in the HPI.   Past Surgical History:  Procedure Laterality Date  . BREAST LUMPECTOMY WITH RADIOACTIVE SEED LOCALIZATION Right 07/13/2018   Procedure: BREAST LUMPECTOMY WITH RADIOACTIVE SEED LOCALIZATION;  Surgeon: Rolm Bookbinder, MD;  Location: [place];  Service: General;  Laterality: Right;  . CHOLECYSTECTOMY    . INSERT / REPLACE / REMOVE PACEMAKER    . PACEMAKER INSERTION    . PERMANENT PACEMAKER INSERTION N/A 07/25/2013   Procedure: PERMANENT  PACEMAKER INSERTION;  Surgeon: Deboraha Sprang, MD;  Location: [facility] CATH LAB;  Service: Cardiovascular;  Laterality: N/A;  . TEE WITHOUT CARDIOVERSION N/A 07/16/2013   Procedure: TRANSESOPHAGEAL ECHOCARDIOGRAM (TEE);  Surgeon: Thayer Headings, MD;  Location: [facility] ENDOSCOPY;  Service: Cardiovascular;  Laterality: N/A;     Family History  Problem Relation Age of Onset  . Cancer Mother   . Breast cancer Mother   . Cancer Father   . Lung cancer Father      Social History   Socioeconomic History  . Marital status: Married    Spouse name: leroy  . Number of children: 2  . Years of education: 79  . Highest education level: Not on file  Occupational History  . Occupation: retired  Tobacco Use  . Smoking status: Never Smoker  . Smokeless tobacco: Never Used  Substance and Sexual Activity  . Alcohol use: No    Alcohol/week: 0.0 standard drinks  . Drug use: No  . Sexual activity: Never  Other Topics Concern  . Not on file  Social History Narrative   Patient lives at home with her husband.   Social Determinants of Health   Financial Resource Strain:   . Difficulty of Paying Living Expenses: Not on file  Food Insecurity:   . Worried About Charity fundraiser in the Last Year: Not on file  . Ran Out of Food in the Last Year: Not on file  Transportation Needs:   . Lack of Transportation (Medical): Not on file  . Lack of Transportation (Non-Medical): Not on file  Physical Activity:   . Days of Exercise per Week: Not on file  . Minutes of Exercise per Session: Not on file  Stress:   . Feeling of Stress : Not on file  Social Connections:   . Frequency of Communication with Friends and Family: Not on file  . Frequency of Social Gatherings with Friends and Family: Not on file  . Attends Religious Services: Not on file  . Active Member of Clubs or Organizations: Not on file  . Attends Archivist Meetings: Not on file  . Marital Status: Not on file  Intimate Partner  Violence:   . Fear of Current or Ex-Partner: Not on file  . Emotionally Abused: Not on file  . Physically Abused: Not on file  . Sexually Abused: Not on file     BP 129/79   Pulse 86   Temp 97.7 F (36.5 C)   Ht 5\' 5"  (1.651 m)   Wt 134 lb (60.8 kg)   SpO2 98%   BMI 22.30 kg/m   Physical Exam:  Well appearing NAD HEENT: Unremarkable Neck:  No JVD, no thyromegally Lymphatics:  No adenopathy Back:  No CVA tenderness Lungs:  Clear with no wheezes HEART:  Regular rate rhythm, no murmurs, no rubs, no clicks Abd:  soft, positive bowel sounds, no organomegally, no rebound, no guarding Ext:  2 plus pulses, no edema, no cyanosis, no clubbing Skin:  No rashes no nodules Neuro:  CN II through XII intact, motor grossly intact  DEVICE  Normal device function.  See PaceArt for details.   Assess/Plan: 1. Sinus node dysfunction - she is stable s/p PPM insertion. 2. PPM -= her medtronic DDD PM has been reprogrammed from the AAIR/DDDR mode to the DDDR mode. 3. PAF - she is maintaining NSR nicely. She will reduce her dose of toprol to 25 bid 4. coags - she has not had any bleeding with Eliquis.   Mikle Bosworth.D.

## 2019-12-18 ENCOUNTER — Encounter: Payer: PPO | Admitting: Internal Medicine

## 2019-12-20 ENCOUNTER — Telehealth: Payer: Self-pay

## 2019-12-20 NOTE — Telephone Encounter (Signed)
Per Pt Dr.Taylor made changes to her pacer. Pt states she feels heart beating in her throat.  Please call [phone]   Thanks renee

## 2019-12-23 NOTE — Telephone Encounter (Signed)
Pt states she feels that she feels fine most of the the day. She states that in the morning she is feeling palpitations. Pt feels she may be back in Afib. Denies SOB at this time. Please advise.

## 2019-12-24 ENCOUNTER — Other Ambulatory Visit: Payer: Self-pay

## 2019-12-24 DIAGNOSIS — I4891 Unspecified atrial fibrillation: Secondary | ICD-10-CM

## 2019-12-24 NOTE — Telephone Encounter (Signed)
Pt came to get EKG. Scanned in chart.

## 2019-12-24 NOTE — Telephone Encounter (Signed)
Ask her to come to [place] office for a 12 lead ECG. GT

## 2019-12-27 ENCOUNTER — Telehealth: Payer: Self-pay | Admitting: Internal Medicine

## 2019-12-27 NOTE — Telephone Encounter (Signed)
Asking for results of EKG

## 2020-01-01 NOTE — Telephone Encounter (Signed)
Will forward to Dr. Taylor to advise.  

## 2020-01-02 ENCOUNTER — Other Ambulatory Visit: Payer: Self-pay | Admitting: Internal Medicine

## 2020-01-02 DIAGNOSIS — I4891 Unspecified atrial fibrillation: Secondary | ICD-10-CM

## 2020-01-06 NOTE — Telephone Encounter (Signed)
ECG showed normal rhythm. GT

## 2020-01-07 NOTE — Telephone Encounter (Signed)
Pt made aware. Voiced understanding.  

## 2020-01-15 DIAGNOSIS — G2581 Restless legs syndrome: Secondary | ICD-10-CM | POA: Diagnosis not present

## 2020-01-15 DIAGNOSIS — E7849 Other hyperlipidemia: Secondary | ICD-10-CM | POA: Diagnosis not present

## 2020-02-14 DIAGNOSIS — N183 Chronic kidney disease, stage 3 unspecified: Secondary | ICD-10-CM | POA: Diagnosis not present

## 2020-02-14 DIAGNOSIS — I10 Essential (primary) hypertension: Secondary | ICD-10-CM | POA: Diagnosis not present

## 2020-02-24 ENCOUNTER — Ambulatory Visit (INDEPENDENT_AMBULATORY_CARE_PROVIDER_SITE_OTHER): Payer: PPO | Admitting: *Deleted

## 2020-02-24 DIAGNOSIS — I495 Sick sinus syndrome: Secondary | ICD-10-CM

## 2020-02-24 DIAGNOSIS — J029 Acute pharyngitis, unspecified: Secondary | ICD-10-CM | POA: Diagnosis not present

## 2020-02-24 DIAGNOSIS — I10 Essential (primary) hypertension: Secondary | ICD-10-CM | POA: Diagnosis not present

## 2020-02-24 DIAGNOSIS — Z299 Encounter for prophylactic measures, unspecified: Secondary | ICD-10-CM | POA: Diagnosis not present

## 2020-02-24 LAB — CUP PACEART REMOTE DEVICE CHECK
Battery Impedance: 449 Ohm
Battery Remaining Longevity: 83 mo
Battery Voltage: 2.78 V
Brady Statistic AP VP Percent: 89 %
Brady Statistic AP VS Percent: 0 %
Brady Statistic AS VP Percent: 11 %
Brady Statistic AS VS Percent: 0 %
Date Time Interrogation Session: 20210510080746
Implantable Lead Implant Date: 20141009
Implantable Lead Implant Date: 20141009
Implantable Lead Location: 753859
Implantable Lead Location: 753860
Implantable Lead Model: 1944
Implantable Lead Model: 1948
Implantable Pulse Generator Implant Date: 20141009
Lead Channel Impedance Value: 498 Ohm
Lead Channel Impedance Value: 772 Ohm
Lead Channel Pacing Threshold Amplitude: 0.75 V
Lead Channel Pacing Threshold Amplitude: 0.875 V
Lead Channel Pacing Threshold Pulse Width: 0.4 ms
Lead Channel Pacing Threshold Pulse Width: 0.4 ms
Lead Channel Setting Pacing Amplitude: 2 V
Lead Channel Setting Pacing Amplitude: 2.5 V
Lead Channel Setting Pacing Pulse Width: 0.4 ms
Lead Channel Setting Sensing Sensitivity: 5.6 mV

## 2020-02-25 NOTE — Progress Notes (Signed)
Remote pacemaker transmission.   

## 2020-02-28 DIAGNOSIS — I10 Essential (primary) hypertension: Secondary | ICD-10-CM | POA: Diagnosis not present

## 2020-02-28 DIAGNOSIS — J029 Acute pharyngitis, unspecified: Secondary | ICD-10-CM | POA: Diagnosis not present

## 2020-02-28 DIAGNOSIS — E039 Hypothyroidism, unspecified: Secondary | ICD-10-CM | POA: Diagnosis not present

## 2020-02-28 DIAGNOSIS — Z299 Encounter for prophylactic measures, unspecified: Secondary | ICD-10-CM | POA: Diagnosis not present

## 2020-03-13 DIAGNOSIS — I4891 Unspecified atrial fibrillation: Secondary | ICD-10-CM | POA: Diagnosis not present

## 2020-03-13 DIAGNOSIS — C50911 Malignant neoplasm of unspecified site of right female breast: Secondary | ICD-10-CM | POA: Diagnosis not present

## 2020-03-13 DIAGNOSIS — Z299 Encounter for prophylactic measures, unspecified: Secondary | ICD-10-CM | POA: Diagnosis not present

## 2020-03-13 DIAGNOSIS — R42 Dizziness and giddiness: Secondary | ICD-10-CM | POA: Diagnosis not present

## 2020-03-13 DIAGNOSIS — I10 Essential (primary) hypertension: Secondary | ICD-10-CM | POA: Diagnosis not present

## 2020-03-13 DIAGNOSIS — N183 Chronic kidney disease, stage 3 unspecified: Secondary | ICD-10-CM | POA: Diagnosis not present

## 2020-03-16 DIAGNOSIS — M545 Low back pain: Secondary | ICD-10-CM | POA: Diagnosis not present

## 2020-03-16 DIAGNOSIS — E7800 Pure hypercholesterolemia, unspecified: Secondary | ICD-10-CM | POA: Diagnosis not present

## 2020-04-15 DIAGNOSIS — M545 Low back pain: Secondary | ICD-10-CM | POA: Diagnosis not present

## 2020-04-15 DIAGNOSIS — I10 Essential (primary) hypertension: Secondary | ICD-10-CM | POA: Diagnosis not present

## 2020-04-27 NOTE — Progress Notes (Signed)
 Patient Care Team: Shah, Ashish, MD as PCP - General (Internal Medicine) Taylor, Gregg W, MD as Consulting Physician (Cardiology) Wakefield, Matthew, MD as Consulting Physician (General Surgery) Gudena, Vinay, MD as Consulting Physician (Hematology and Oncology) Moody, John, MD as Consulting Physician (Radiation Oncology) Causey, Lindsey Cornetto, NP as Nurse Practitioner (Hematology and Oncology)  DIAGNOSIS:    ICD-10-CM   1. Malignant neoplasm of upper-outer quadrant of right breast in female, estrogen receptor positive (HCC)  C50.411    Z17.0     SUMMARY OF ONCOLOGIC HISTORY: Oncology History  Malignant neoplasm of upper-outer quadrant of right breast in female, estrogen receptor positive (HCC)  06/11/2018 Initial Diagnosis   Screening detected right breast asymmetry posteriorly measuring 0.3 cm at 12 o'clock position.  Biopsy revealed grade 1-2 invasive ductal carcinoma ER 100%, PR 80%, Ki-67 2%, HER-2 negative, T1 a N0 stage Ia   07/13/2018 Surgery   Right lumpectomy: IDC grade 2, 0.7 cm, cancer does not involve margins, negative for lymphovascular or perineural invasion, lymph nodes not sampled, ER 100%, PR 80%, HER-2 negative, Ki-67 2%, T1B NX stage Ia   07/25/2018 Cancer Staging   Staging form: Breast, AJCC 8th Edition - Pathologic: Stage IA (pT1b, pN0, cM0, G2, ER+, PR+, HER2-) - Signed by Causey, Lindsey Cornetto, NP on 07/25/2018   07/2018 -  Anti-estrogen oral therapy   Letrozole daily     CHIEF COMPLIANT: Follow-up of left breast cancer on letrozole  INTERVAL HISTORY: Jessica Myers is a 86 y.o. with above-mentioned history of left breast cancer treated with lumpectomy and who is currently on anti-estrogen therapy with letrozole. Mammogram on 06/10/19 showed no evidence of malignancy bilaterally. She presents to the clinic today for annual follow-up.   She is tolerating the treatment extremely well.  She does not complain of any lumps or nodules in the breast.  She  feels that she bruises very easily.  She is however very independent and enjoys gardening.  ALLERGIES:  is allergic to biaxin [clarithromycin], penicillins, shellfish allergy, sulfa antibiotics, and celebrex [celecoxib].  MEDICATIONS:  Current Outpatient Medications  Medication Sig Dispense Refill  . acetaminophen (TYLENOL) 500 MG tablet Take 500-1,000 mg by mouth every 6 (six) hours as needed for mild pain.    . atorvastatin (LIPITOR) 10 MG tablet Take 10 mg by mouth every evening.     . calcium carbonate (OSCAL) 1500 (600 Ca) MG TABS tablet Take by mouth 2 (two) times daily with a meal.    . carbamide peroxide ([employer] EAR DROPS) 6.5 % OTIC solution Place 5-10 drops into both ears 2 (two) times daily as needed (wax build up).    . cholecalciferol (VITAMIN D) 1000 UNITS tablet Take 1,000 Units by mouth daily.     . ELIQUIS 5 MG TABS tablet TAKE 1 TABLET BY MOUTH TWICE A DAY 180 tablet 2  . flecainide (TAMBOCOR) 150 MG tablet TAKE 1/2 TABLETS (75 MG TOTAL) BY MOUTH 2 (TWO) TIMES DAILY. PLEASE KEEP UPCOMING APPT IN FEBRUARY 90 tablet 3  . letrozole (FEMARA) 2.5 MG tablet Take 1 tablet (2.5 mg total) by mouth daily. 90 tablet 3  . levothyroxine (SYNTHROID, LEVOTHROID) 112 MCG tablet Take 112 mcg by mouth daily before breakfast.  2  . lisinopril (PRINIVIL,ZESTRIL) 10 MG tablet Take 10 mg by mouth 2 (two) times daily.    . meclizine (ANTIVERT) 25 MG tablet Take 25 mg by mouth 3 (three) times daily as needed for dizziness.    . metoprolol succinate (TOPROL XL) 25   MG 24 hr tablet Take 1 tablet (25 mg total) by mouth in the morning and at bedtime. 60 tablet 11  . metoprolol succinate (TOPROL-XL) 25 MG 24 hr tablet TAKE 1 TABLET BY MOUTH 3 TIMES DAILY 270 tablet 3  . omeprazole (PRILOSEC) 20 MG capsule Take 20 mg by mouth at bedtime.     . Polyethyl Glycol-Propyl Glycol (SYSTANE) 0.4-0.3 % SOLN Place 1 drop into both eyes 3 (three) times daily as needed (for dry eyes.).    . polyethylene glycol (MIRALAX /  GLYCOLAX) packet Take 17 g by mouth daily as needed (for constipation.).     . simethicone (MYLICON) 125 MG chewable tablet Chew 125-250 mg by mouth every 6 (six) hours as needed for flatulence.     No current facility-administered medications for this visit.    PHYSICAL EXAMINATION: ECOG PERFORMANCE STATUS: 1 - Symptomatic but completely ambulatory  Vitals:   04/28/20 0911  BP: 139/80  Pulse: 85  Resp: 17  Temp: 98.5 F (36.9 C)  SpO2: 99%   Filed Weights   04/28/20 0911  Weight: 132 lb 12.8 oz (60.2 kg)    BREAST: No palpable masses or nodules in either right or left breasts. No palpable axillary supraclavicular or infraclavicular adenopathy no breast tenderness or nipple discharge. (exam performed in the presence of a chaperone)  LABORATORY DATA:  I have reviewed the data as listed CMP Latest Ref Rng & Units 07/06/2018 06/20/2018 12/13/2017  Glucose 70 - 99 mg/dL 98 83 85  BUN 8 - 23 mg/dL 25(H) 25(H) 19  Creatinine 0.44 - 1.00 mg/dL 1.48(H) 1.34(H) 1.24(H)  Sodium 135 - 145 mmol/L 139 141 139  Potassium 3.5 - 5.1 mmol/L 4.7 4.9 4.3  Chloride 98 - 111 mmol/L 105 106 103  CO2 22 - 32 mmol/L 27 28 27  Calcium 8.9 - 10.3 mg/dL 9.3 10.1 9.7  Total Protein 6.5 - 8.1 g/dL - 7.3 -  Total Bilirubin 0.3 - 1.2 mg/dL - 0.8 -  Alkaline Phos 38 - 126 U/L - 83 -  AST 15 - 41 U/L - 17 -  ALT 0 - 44 U/L - 11 -    Lab Results  Component Value Date   WBC 5.9 07/06/2018   HGB 12.3 07/06/2018   HCT 38.7 07/06/2018   MCV 98.7 07/06/2018   PLT 170 07/06/2018   NEUTROABS 3.0 06/20/2018    ASSESSMENT & PLAN:  Malignant neoplasm of upper-outer quadrant of right breast in female, estrogen receptor positive (HCC) 07/13/2018:Right lumpectomy: IDC grade 2, 0.7 cm, cancer does not involve margins, negative for lymphovascular or perineural invasion, lymph nodes not sampled, ER 100%, PR 80%, HER-2 negative, Ki-67 2%, T1B NX stage Ia  Current treatment: Adjuvant antiestrogen therapy with  letrozole 2.5 mg daily x5 years started Oct 2019 Letrozole Toxicities: No side effects Enjoys gardening.  Breast cancer Surveillance:  1. Mammogram: Aug 2020 2. Dexa Scan 09/11/18: T score -1.8    RTC in 1 year    No orders of the defined types were placed in this encounter.  The patient has a good understanding of the overall plan. she agrees with it. she will call with any problems that may develop before the next visit here.  Total time spent: 20 mins including face to face time and time spent for planning, charting and coordination of care  Gudena, Vinay, MD 04/28/2020  I, Molly Dorshimer, am acting as scribe for Dr. Vinay Gudena.  I have reviewed the above documentation for accuracy   and completeness, and I agree with the above.

## 2020-04-28 ENCOUNTER — Inpatient Hospital Stay: Payer: PPO | Attending: Hematology and Oncology | Admitting: Hematology and Oncology

## 2020-04-28 ENCOUNTER — Other Ambulatory Visit: Payer: Self-pay

## 2020-04-28 DIAGNOSIS — Z79811 Long term (current) use of aromatase inhibitors: Secondary | ICD-10-CM | POA: Insufficient documentation

## 2020-04-28 DIAGNOSIS — Z923 Personal history of irradiation: Secondary | ICD-10-CM | POA: Diagnosis not present

## 2020-04-28 DIAGNOSIS — C50411 Malignant neoplasm of upper-outer quadrant of right female breast: Secondary | ICD-10-CM | POA: Diagnosis not present

## 2020-04-28 DIAGNOSIS — Z79899 Other long term (current) drug therapy: Secondary | ICD-10-CM | POA: Diagnosis not present

## 2020-04-28 DIAGNOSIS — Z7901 Long term (current) use of anticoagulants: Secondary | ICD-10-CM | POA: Insufficient documentation

## 2020-04-28 DIAGNOSIS — Z170 Estrogen receptor positive status [ER+]: Secondary | ICD-10-CM | POA: Insufficient documentation

## 2020-04-28 MED ORDER — LETROZOLE 2.5 MG PO TABS: 2.5000 mg | ORAL_TABLET | Freq: Every day | ORAL | 3 refills | Status: DC

## 2020-04-28 NOTE — Assessment & Plan Note (Signed)
07/13/2018:Right lumpectomy: IDC grade 2, 0.7 cm, cancer does not involve margins, negative for lymphovascular or perineural invasion, lymph nodes not sampled, ER 100%, PR 80%, HER-2 negative, Ki-67 2%, T1B NX stage Ia  Current treatment: Adjuvant antiestrogen therapy with letrozole 2.5 mg daily x5 years started Oct 2019 Letrozole Toxicities: No side effects Enjoys gardening.  Breast cancer Surveillance:  1. Mammogram: Aug 2020 2. Dexa Scan 09/11/18: T score -1.8    RTC in 1 year

## 2020-05-15 DIAGNOSIS — E785 Hyperlipidemia, unspecified: Secondary | ICD-10-CM | POA: Diagnosis not present

## 2020-05-15 DIAGNOSIS — I4891 Unspecified atrial fibrillation: Secondary | ICD-10-CM | POA: Diagnosis not present

## 2020-05-15 DIAGNOSIS — N183 Chronic kidney disease, stage 3 unspecified: Secondary | ICD-10-CM | POA: Diagnosis not present

## 2020-05-15 DIAGNOSIS — I129 Hypertensive chronic kidney disease with stage 1 through stage 4 chronic kidney disease, or unspecified chronic kidney disease: Secondary | ICD-10-CM | POA: Diagnosis not present

## 2020-05-21 DIAGNOSIS — M1710 Unilateral primary osteoarthritis, unspecified knee: Secondary | ICD-10-CM | POA: Diagnosis not present

## 2020-05-21 DIAGNOSIS — I10 Essential (primary) hypertension: Secondary | ICD-10-CM | POA: Diagnosis not present

## 2020-05-21 DIAGNOSIS — N183 Chronic kidney disease, stage 3 unspecified: Secondary | ICD-10-CM | POA: Diagnosis not present

## 2020-05-21 DIAGNOSIS — E7800 Pure hypercholesterolemia, unspecified: Secondary | ICD-10-CM | POA: Diagnosis not present

## 2020-05-21 DIAGNOSIS — Z299 Encounter for prophylactic measures, unspecified: Secondary | ICD-10-CM | POA: Diagnosis not present

## 2020-05-21 DIAGNOSIS — Z950 Presence of cardiac pacemaker: Secondary | ICD-10-CM | POA: Diagnosis not present

## 2020-05-25 ENCOUNTER — Ambulatory Visit (INDEPENDENT_AMBULATORY_CARE_PROVIDER_SITE_OTHER): Payer: PPO | Admitting: *Deleted

## 2020-05-25 DIAGNOSIS — I639 Cerebral infarction, unspecified: Secondary | ICD-10-CM

## 2020-05-27 LAB — CUP PACEART REMOTE DEVICE CHECK
Battery Impedance: 474 Ohm
Battery Remaining Longevity: 82 mo
Battery Voltage: 2.78 V
Brady Statistic AP VP Percent: 94 %
Brady Statistic AP VS Percent: 0 %
Brady Statistic AS VP Percent: 6 %
Brady Statistic AS VS Percent: 0 %
Date Time Interrogation Session: 20210809123211
Implantable Lead Implant Date: 20141009
Implantable Lead Implant Date: 20141009
Implantable Lead Location: 753859
Implantable Lead Location: 753860
Implantable Lead Model: 1944
Implantable Lead Model: 1948
Implantable Pulse Generator Implant Date: 20141009
Lead Channel Impedance Value: 528 Ohm
Lead Channel Impedance Value: 837 Ohm
Lead Channel Pacing Threshold Amplitude: 0.75 V
Lead Channel Pacing Threshold Amplitude: 0.875 V
Lead Channel Pacing Threshold Pulse Width: 0.4 ms
Lead Channel Pacing Threshold Pulse Width: 0.4 ms
Lead Channel Setting Pacing Amplitude: 2 V
Lead Channel Setting Pacing Amplitude: 2.5 V
Lead Channel Setting Pacing Pulse Width: 0.4 ms
Lead Channel Setting Sensing Sensitivity: 5.6 mV

## 2020-05-28 NOTE — Progress Notes (Signed)
Remote pacemaker transmission.   

## 2020-07-06 ENCOUNTER — Encounter: Payer: Self-pay | Admitting: Hematology and Oncology

## 2020-07-06 DIAGNOSIS — Z853 Personal history of malignant neoplasm of breast: Secondary | ICD-10-CM | POA: Diagnosis not present

## 2020-07-06 DIAGNOSIS — R928 Other abnormal and inconclusive findings on diagnostic imaging of breast: Secondary | ICD-10-CM | POA: Diagnosis not present

## 2020-07-17 DIAGNOSIS — M542 Cervicalgia: Secondary | ICD-10-CM | POA: Diagnosis not present

## 2020-07-17 DIAGNOSIS — Z299 Encounter for prophylactic measures, unspecified: Secondary | ICD-10-CM | POA: Diagnosis not present

## 2020-07-17 DIAGNOSIS — E039 Hypothyroidism, unspecified: Secondary | ICD-10-CM | POA: Diagnosis not present

## 2020-07-17 DIAGNOSIS — I10 Essential (primary) hypertension: Secondary | ICD-10-CM | POA: Diagnosis not present

## 2020-07-17 DIAGNOSIS — Z6823 Body mass index (BMI) 23.0-23.9, adult: Secondary | ICD-10-CM | POA: Diagnosis not present

## 2020-07-17 DIAGNOSIS — I4891 Unspecified atrial fibrillation: Secondary | ICD-10-CM | POA: Diagnosis not present

## 2020-08-14 DIAGNOSIS — N183 Chronic kidney disease, stage 3 unspecified: Secondary | ICD-10-CM | POA: Diagnosis not present

## 2020-08-14 DIAGNOSIS — E039 Hypothyroidism, unspecified: Secondary | ICD-10-CM | POA: Diagnosis not present

## 2020-08-14 DIAGNOSIS — I129 Hypertensive chronic kidney disease with stage 1 through stage 4 chronic kidney disease, or unspecified chronic kidney disease: Secondary | ICD-10-CM | POA: Diagnosis not present

## 2020-08-14 DIAGNOSIS — E7849 Other hyperlipidemia: Secondary | ICD-10-CM | POA: Diagnosis not present

## 2020-08-21 DIAGNOSIS — Z79899 Other long term (current) drug therapy: Secondary | ICD-10-CM | POA: Diagnosis not present

## 2020-08-21 DIAGNOSIS — R5383 Other fatigue: Secondary | ICD-10-CM | POA: Diagnosis not present

## 2020-08-21 DIAGNOSIS — Z1331 Encounter for screening for depression: Secondary | ICD-10-CM | POA: Diagnosis not present

## 2020-08-21 DIAGNOSIS — Z7189 Other specified counseling: Secondary | ICD-10-CM | POA: Diagnosis not present

## 2020-08-21 DIAGNOSIS — E7800 Pure hypercholesterolemia, unspecified: Secondary | ICD-10-CM | POA: Diagnosis not present

## 2020-08-21 DIAGNOSIS — Z0000 Encounter for general adult medical examination without abnormal findings: Secondary | ICD-10-CM | POA: Diagnosis not present

## 2020-08-21 DIAGNOSIS — Z1339 Encounter for screening examination for other mental health and behavioral disorders: Secondary | ICD-10-CM | POA: Diagnosis not present

## 2020-08-21 DIAGNOSIS — Z299 Encounter for prophylactic measures, unspecified: Secondary | ICD-10-CM | POA: Diagnosis not present

## 2020-08-21 DIAGNOSIS — E559 Vitamin D deficiency, unspecified: Secondary | ICD-10-CM | POA: Diagnosis not present

## 2020-08-21 DIAGNOSIS — I10 Essential (primary) hypertension: Secondary | ICD-10-CM | POA: Diagnosis not present

## 2020-08-21 DIAGNOSIS — Z6823 Body mass index (BMI) 23.0-23.9, adult: Secondary | ICD-10-CM | POA: Diagnosis not present

## 2020-08-24 ENCOUNTER — Ambulatory Visit (INDEPENDENT_AMBULATORY_CARE_PROVIDER_SITE_OTHER): Payer: PPO

## 2020-08-24 DIAGNOSIS — I4891 Unspecified atrial fibrillation: Secondary | ICD-10-CM

## 2020-08-25 LAB — CUP PACEART REMOTE DEVICE CHECK
Battery Impedance: 548 Ohm
Battery Remaining Longevity: 77 mo
Battery Voltage: 2.78 V
Brady Statistic AP VP Percent: 96 %
Brady Statistic AP VS Percent: 0 %
Brady Statistic AS VP Percent: 4 %
Brady Statistic AS VS Percent: 0 %
Date Time Interrogation Session: 20211108081400
Implantable Lead Implant Date: 20141009
Implantable Lead Implant Date: 20141009
Implantable Lead Location: 753859
Implantable Lead Location: 753860
Implantable Lead Model: 1944
Implantable Lead Model: 1948
Implantable Pulse Generator Implant Date: 20141009
Lead Channel Impedance Value: 511 Ohm
Lead Channel Impedance Value: 854 Ohm
Lead Channel Pacing Threshold Amplitude: 0.75 V
Lead Channel Pacing Threshold Amplitude: 0.75 V
Lead Channel Pacing Threshold Pulse Width: 0.4 ms
Lead Channel Pacing Threshold Pulse Width: 0.4 ms
Lead Channel Setting Pacing Amplitude: 2 V
Lead Channel Setting Pacing Amplitude: 2.5 V
Lead Channel Setting Pacing Pulse Width: 0.4 ms
Lead Channel Setting Sensing Sensitivity: 5.6 mV

## 2020-08-26 NOTE — Progress Notes (Signed)
Remote pacemaker transmission.   

## 2020-09-21 DIAGNOSIS — E2839 Other primary ovarian failure: Secondary | ICD-10-CM | POA: Diagnosis not present

## 2020-09-23 DIAGNOSIS — Z299 Encounter for prophylactic measures, unspecified: Secondary | ICD-10-CM | POA: Diagnosis not present

## 2020-09-23 DIAGNOSIS — I4891 Unspecified atrial fibrillation: Secondary | ICD-10-CM | POA: Diagnosis not present

## 2020-09-23 DIAGNOSIS — Z6823 Body mass index (BMI) 23.0-23.9, adult: Secondary | ICD-10-CM | POA: Diagnosis not present

## 2020-09-23 DIAGNOSIS — Z9181 History of falling: Secondary | ICD-10-CM | POA: Diagnosis not present

## 2020-09-23 DIAGNOSIS — S62315A Displaced fracture of base of fourth metacarpal bone, left hand, initial encounter for closed fracture: Secondary | ICD-10-CM | POA: Diagnosis not present

## 2020-09-23 DIAGNOSIS — S41112A Laceration without foreign body of left upper arm, initial encounter: Secondary | ICD-10-CM | POA: Diagnosis not present

## 2020-09-23 DIAGNOSIS — I10 Essential (primary) hypertension: Secondary | ICD-10-CM | POA: Diagnosis not present

## 2020-09-23 DIAGNOSIS — M79642 Pain in left hand: Secondary | ICD-10-CM | POA: Diagnosis not present

## 2020-10-01 ENCOUNTER — Ambulatory Visit (INDEPENDENT_AMBULATORY_CARE_PROVIDER_SITE_OTHER): Payer: PPO | Admitting: Orthopaedic Surgery

## 2020-10-01 ENCOUNTER — Encounter: Payer: Self-pay | Admitting: Orthopaedic Surgery

## 2020-10-01 ENCOUNTER — Other Ambulatory Visit: Payer: Self-pay

## 2020-10-01 DIAGNOSIS — S62355A Nondisplaced fracture of shaft of fourth metacarpal bone, left hand, initial encounter for closed fracture: Secondary | ICD-10-CM | POA: Diagnosis not present

## 2020-10-01 DIAGNOSIS — S62309A Unspecified fracture of unspecified metacarpal bone, initial encounter for closed fracture: Secondary | ICD-10-CM | POA: Insufficient documentation

## 2020-10-01 NOTE — Progress Notes (Signed)
Office Visit Note   Patient: Jessica Myers           Date of Birth: 24-Jan-1929           MRN: [id number] Visit Date: 10/01/2020              Requested by: Monico Blitz, MD [address] [place],  [place] [address] PCP: Monico Blitz, MD   Assessment & Plan: Visit Diagnoses:  1. Closed nondisplaced fracture of shaft of fourth metacarpal bone of left hand, initial encounter     Plan: Risk 1 applied.  She will avoid squeezing gripping.  She has minimal swelling of her fingers at this point I will recheck her in 3 weeks no x-ray needed on return.  X-rays were reviewed with her I gave her a copy.  She should heal success with conservative treatment and we will check her in 3 weeks to make sure she is not having problems with finger stiffness. Follow-Up Instructions: Return in about 3 weeks (around 10/22/2020).   Orders:  No orders of the defined types were placed in this encounter.  No orders of the defined types were placed in this encounter.     Procedures: No procedures performed   Clinical Data: No additional findings.   Subjective: Chief Complaint  Patient presents with  . Left Hand - Fracture    HPI 86 year old female was going up the steps at her house caught her toe on the third step fell forward injuring her left hand with pain swelling.  X-rays were obtained which showed fourth metacarpal shaft fracture essentially nondisplaced.  She has been able to use her hand to some degree but has pain with gripping and squeezing.  She was sent here by Dr.Shah.  Patient had multiple skin abrasions and tears mostly on her arms also forearm.  Metacarpal fracture is a closed injury.  Review of Systems past history of rib fractures in the past syncope pulmonary contusion pacemaker.  All other systems are noncontributory to HPI.   Objective: Vital Signs: BP 115/70   Pulse 81   Ht 5\' 3"  (1.6 m)   Wt 132 lb (59.9 kg)   BMI 23.38 kg/m   Physical Exam Constitutional:      Appearance:  She is well-developed.  HENT:     Head: Normocephalic.     Right Ear: External ear normal.     Left Ear: External ear normal.  Eyes:     Pupils: Pupils are equal, round, and reactive to light.  Neck:     Thyroid: No thyromegaly.     Trachea: No tracheal deviation.  Cardiovascular:     Rate and Rhythm: Normal rate.  Pulmonary:     Effort: Pulmonary effort is normal.  Abdominal:     Palpations: Abdomen is soft.  Skin:    General: Skin is warm and dry.  Neurological:     Mental Status: She is alert and oriented to person, place, and time.  Psychiatric:        Mood and Affect: Mood and affect normal.        Behavior: Behavior normal.     Ortho Exam skin tears with dressing over left triceps region  Specialty Comments:  No specialty comments available.  Imaging: No results found.   PMFS History: Patient Active Problem List   Diagnosis Date Noted  . Metacarpal bone fracture 10/01/2020  . Breast cancer, right ([place]) 07/13/2018  . Malignant neoplasm of upper-outer quadrant of right breast in female,  estrogen receptor positive ([place]) 06/15/2018  . Fall down stairs 07/05/2016  . Long-term (current) use of anticoagulants 07/05/2016  . Closed fracture of distal phalanx of digit of left hand 07/05/2016  . Pulmonary contusion 07/05/2016  . Traumatic pneumothorax 07/05/2016  . Acute blood loss anemia 07/05/2016  . Multiple fractures of ribs of right side 07/02/2016  . Musculoskeletal arm pain 01/03/2014  . Hoarseness, persistent 01/03/2014  . Pacemaker 10/24/2013  . Atrial fibrillation ([place]) 10/24/2013  . Syncope 07/25/2013  . Stroke ([place]) 07/16/2013  . Acute right MCA stroke ([place]) 07/16/2013  . Acute left hemiparesis ([place]) 07/16/2013  . Accelerated hypertension 07/16/2013  . Dyslipidemia, goal LDL below 100 07/16/2013  . Dysarthria as late effect of stroke 07/16/2013  . Unspecified hypothyroidism 07/16/2013  . Encounter for long-term (current) use of medications 07/16/2013    Past Medical History:  Diagnosis Date  . A-fib ([place])   . Anxiety   . Bradycardia    s/p PPM October 2014  . Chronic anticoagulation   . Dysrhythmia   . Headache(784.0)   . High cholesterol   . Hypertension   . Malignant neoplasm of upper-outer quadrant of right breast in female, estrogen receptor positive ([place]) 06/15/2018  . Multiple rib fractures 07/01/2016   fell down stairs   . Pulmonary contusion 06/2016   from a fall   . Stroke [facility]) September 2014   thrombolytic therapy  . Thyroid disease   . Vertical vertigo     Family History  Problem Relation Age of Onset  . Cancer Mother   . Breast cancer Mother   . Cancer Father   . Lung cancer Father     Past Surgical History:  Procedure Laterality Date  . BREAST LUMPECTOMY WITH RADIOACTIVE SEED LOCALIZATION Right 07/13/2018   Procedure: BREAST LUMPECTOMY WITH RADIOACTIVE SEED LOCALIZATION;  Surgeon: Rolm Bookbinder, MD;  Location: [place];  Service: General;  Laterality: Right;  . CHOLECYSTECTOMY    . INSERT / REPLACE / REMOVE PACEMAKER    . PACEMAKER INSERTION    . PERMANENT PACEMAKER INSERTION N/A 07/25/2013   Procedure: PERMANENT PACEMAKER INSERTION;  Surgeon: Deboraha Sprang, MD;  [facility] C[facility] LAB;  Service: Cardiovascular;  Laterality: N/A;  . TEE WITHOUT CARDIOVERSION N/A 07/16/2013   Procedure: TRANSESOPHAGEAL ECHOCARDIOGRAM (TEE);  Surgeon: Thayer Headings, MD;  Location: [place];  Service: Cardiovascular;  Laterality: N/A;   Social History   Occupational History  . Occupation: retired  Tobacco Use  . Smoking status: Never Smoker  . Smokeless tobacco: Never Used  Vaping Use  . Vaping Use: Never used  Substance and Sexual Activity  . Alcohol use: No    Alcohol/week: 0.0 standard drinks  . Drug use: No  . Sexual activity: Never

## 2020-10-16 DIAGNOSIS — E7849 Other hyperlipidemia: Secondary | ICD-10-CM | POA: Diagnosis not present

## 2020-10-16 DIAGNOSIS — I129 Hypertensive chronic kidney disease with stage 1 through stage 4 chronic kidney disease, or unspecified chronic kidney disease: Secondary | ICD-10-CM | POA: Diagnosis not present

## 2020-10-16 DIAGNOSIS — N183 Chronic kidney disease, stage 3 unspecified: Secondary | ICD-10-CM | POA: Diagnosis not present

## 2020-10-22 ENCOUNTER — Ambulatory Visit (INDEPENDENT_AMBULATORY_CARE_PROVIDER_SITE_OTHER): Payer: PPO

## 2020-10-22 ENCOUNTER — Encounter: Payer: Self-pay | Admitting: Orthopaedic Surgery

## 2020-10-22 ENCOUNTER — Ambulatory Visit (INDEPENDENT_AMBULATORY_CARE_PROVIDER_SITE_OTHER): Payer: PPO | Admitting: Orthopaedic Surgery

## 2020-10-22 VITALS — Ht 63.0 in | Wt 132.0 lb

## 2020-10-22 DIAGNOSIS — S62355A Nondisplaced fracture of shaft of fourth metacarpal bone, left hand, initial encounter for closed fracture: Secondary | ICD-10-CM | POA: Diagnosis not present

## 2020-10-22 DIAGNOSIS — M25552 Pain in left hip: Secondary | ICD-10-CM

## 2020-10-22 DIAGNOSIS — S7002XA Contusion of left hip, initial encounter: Secondary | ICD-10-CM | POA: Diagnosis not present
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
## 2020-11-08 ENCOUNTER — Other Ambulatory Visit: Payer: Self-pay | Admitting: Internal Medicine

## 2020-11-08 DIAGNOSIS — I4891 Unspecified atrial fibrillation: Secondary | ICD-10-CM

## 2020-11-16 DIAGNOSIS — N183 Chronic kidney disease, stage 3 unspecified: Secondary | ICD-10-CM | POA: Diagnosis not present

## 2020-11-16 DIAGNOSIS — E7849 Other hyperlipidemia: Secondary | ICD-10-CM | POA: Diagnosis not present

## 2020-11-16 DIAGNOSIS — I129 Hypertensive chronic kidney disease with stage 1 through stage 4 chronic kidney disease, or unspecified chronic kidney disease: Secondary | ICD-10-CM | POA: Diagnosis not present

## 2020-11-23 ENCOUNTER — Telehealth: Payer: Self-pay

## 2020-11-23 NOTE — Telephone Encounter (Signed)
Pt states [employer] is sending a new handheld.

## 2020-11-24 DIAGNOSIS — I4891 Unspecified atrial fibrillation: Secondary | ICD-10-CM | POA: Diagnosis not present

## 2020-11-24 DIAGNOSIS — Z789 Other specified health status: Secondary | ICD-10-CM | POA: Diagnosis not present

## 2020-11-24 DIAGNOSIS — I10 Essential (primary) hypertension: Secondary | ICD-10-CM | POA: Diagnosis not present

## 2020-11-24 DIAGNOSIS — Z299 Encounter for prophylactic measures, unspecified: Secondary | ICD-10-CM | POA: Diagnosis not present

## 2020-11-24 DIAGNOSIS — N183 Chronic kidney disease, stage 3 unspecified: Secondary | ICD-10-CM | POA: Diagnosis not present

## 2020-11-24 DIAGNOSIS — Z6823 Body mass index (BMI) 23.0-23.9, adult: Secondary | ICD-10-CM | POA: Diagnosis not present

## 2020-11-24 DIAGNOSIS — C50911 Malignant neoplasm of unspecified site of right female breast: Secondary | ICD-10-CM | POA: Diagnosis not present

## 2020-12-08 DIAGNOSIS — H353131 Nonexudative age-related macular degeneration, bilateral, early dry stage: Secondary | ICD-10-CM | POA: Diagnosis not present

## 2020-12-09 ENCOUNTER — Ambulatory Visit (INDEPENDENT_AMBULATORY_CARE_PROVIDER_SITE_OTHER): Payer: PPO

## 2020-12-09 DIAGNOSIS — I4891 Unspecified atrial fibrillation: Secondary | ICD-10-CM

## 2020-12-10 LAB — CUP PACEART REMOTE DEVICE CHECK
Battery Impedance: 649 Ohm
Battery Remaining Longevity: 71 mo
Battery Voltage: 2.77 V
Brady Statistic AP VP Percent: 97 %
Brady Statistic AP VS Percent: 0 %
Brady Statistic AS VP Percent: 3 %
Brady Statistic AS VS Percent: 0 %
Date Time Interrogation Session: 20220223083109
Implantable Lead Implant Date: 20141009
Implantable Lead Implant Date: 20141009
Implantable Lead Location: 753859
Implantable Lead Location: 753860
Implantable Lead Model: 1944
Implantable Lead Model: 1948
Implantable Pulse Generator Implant Date: 20141009
Lead Channel Impedance Value: 512 Ohm
Lead Channel Impedance Value: 855 Ohm
Lead Channel Pacing Threshold Amplitude: 0.75 V
Lead Channel Pacing Threshold Amplitude: 0.875 V
Lead Channel Pacing Threshold Pulse Width: 0.4 ms
Lead Channel Pacing Threshold Pulse Width: 0.4 ms
Lead Channel Setting Pacing Amplitude: 2 V
Lead Channel Setting Pacing Amplitude: 2.5 V
Lead Channel Setting Pacing Pulse Width: 0.4 ms
Lead Channel Setting Sensing Sensitivity: 5.6 mV

## 2020-12-14 DIAGNOSIS — N183 Chronic kidney disease, stage 3 unspecified: Secondary | ICD-10-CM | POA: Diagnosis not present

## 2020-12-14 DIAGNOSIS — E7849 Other hyperlipidemia: Secondary | ICD-10-CM | POA: Diagnosis not present

## 2020-12-14 DIAGNOSIS — I129 Hypertensive chronic kidney disease with stage 1 through stage 4 chronic kidney disease, or unspecified chronic kidney disease: Secondary | ICD-10-CM | POA: Diagnosis not present

## 2020-12-18 NOTE — Progress Notes (Signed)
Remote pacemaker transmission.   

## 2020-12-31 ENCOUNTER — Ambulatory Visit: Payer: PPO | Admitting: Internal Medicine

## 2020-12-31 ENCOUNTER — Encounter: Payer: Self-pay | Admitting: Internal Medicine

## 2020-12-31 ENCOUNTER — Other Ambulatory Visit: Payer: Self-pay

## 2020-12-31 VITALS — BP 140/82 | HR 72 | Ht 64.0 in | Wt 131.8 lb

## 2020-12-31 DIAGNOSIS — I4891 Unspecified atrial fibrillation: Secondary | ICD-10-CM

## 2020-12-31 LAB — CUP PACEART INCLINIC DEVICE CHECK
Date Time Interrogation Session: 20220317141937
Implantable Lead Implant Date: 20141009
Implantable Lead Implant Date: 20141009
Implantable Lead Location: 753859
Implantable Lead Location: 753860
Implantable Lead Model: 1944
Implantable Lead Model: 1948
Implantable Pulse Generator Implant Date: 20141009
Lead Channel Pacing Threshold Amplitude: 0.5 V
Lead Channel Pacing Threshold Amplitude: 0.75 V
Lead Channel Pacing Threshold Pulse Width: 0.4 ms
Lead Channel Pacing Threshold Pulse Width: 0.4 ms

## 2020-12-31 NOTE — Progress Notes (Signed)
HPI Jessica Myers returns today for followup. She is a pleasant 87 yo woman with a h/o sinus node dysfunction, s/p PPM insertion, PAF, and HTN. In the interim, she has done well except that she has occaisional lightheaded spells. No syncope. No palpitations Allergies  Allergen Reactions  . Biaxin [Clarithromycin] Shortness Of Breath and Rash    Unknown  . Penicillins Hives    Has patient had a PCN reaction causing immediate rash, facial/tongue/throat swelling, SOB or lightheadedness with hypotension: Yes Has patient had a PCN reaction causing severe rash involving mucus membranes or skin necrosis: No Has patient had a PCN reaction that required hospitalization No Has patient had a PCN reaction occurring within the last 10 years: No If all of the above answers are "NO", then may proceed with Cephalosporin use.    . Shellfish Allergy Nausea And Vomiting  . Sulfa Antibiotics Other (See Comments)    "bad reaction," per patient  . Celebrex [Celecoxib] Rash     Current Outpatient Medications  Medication Sig Dispense Refill  . acetaminophen (TYLENOL) 500 MG tablet Take 500-1,000 mg by mouth every 6 (six) hours as needed for mild pain.    Marland Kitchen atorvastatin (LIPITOR) 10 MG tablet Take 10 mg by mouth every evening.     . calcium carbonate (OSCAL) 1500 (600 Ca) MG TABS tablet Take by mouth 2 (two) times daily with a meal.    . carbamide peroxide (DEBROX) 6.5 % OTIC solution Place 5-10 drops into both ears 2 (two) times daily as needed (wax build up).    . cholecalciferol (VITAMIN D) 1000 UNITS tablet Take 1,000 Units by mouth daily.     Marland Kitchen ELIQUIS 5 MG TABS tablet TAKE 1 TABLET BY MOUTH TWICE A DAY 180 tablet 2  . flecainide (TAMBOCOR) 150 MG tablet TAKE 1/2 TABLETS (75 MG TOTAL) BY MOUTH 2 (TWO) TIMES DAILY. PLEASE KEEP UPCOMING APPT IN FEBRUARY 90 tablet 3  . letrozole (FEMARA) 2.5 MG tablet Take 1 tablet (2.5 mg total) by mouth daily. 90 tablet 3  . levothyroxine (SYNTHROID, LEVOTHROID)  112 MCG tablet Take 112 mcg by mouth daily before breakfast.  2  . lisinopril (PRINIVIL,ZESTRIL) 10 MG tablet Take 10 mg by mouth 2 (two) times daily.    . meclizine (ANTIVERT) 25 MG tablet Take 25 mg by mouth 3 (three) times daily as needed for dizziness.    . metoprolol succinate (TOPROL-XL) 25 MG 24 hr tablet TAKE 1 TABLET (25 MG TOTAL) BY MOUTH IN THE MORNING AND AT BEDTIME. (Patient taking differently: Take 25 mg by mouth in the morning, at noon, and at bedtime.) 180 tablet 3  . omeprazole (PRILOSEC) 20 MG capsule Take 20 mg by mouth at bedtime.     Vladimir Faster Glycol-Propyl Glycol 0.4-0.3 % SOLN Place 1 drop into both eyes 3 (three) times daily as needed (for dry eyes.).    Marland Kitchen polyethylene glycol (MIRALAX / GLYCOLAX) packet Take 17 g by mouth daily as needed (for constipation.).     Marland Kitchen simethicone (MYLICON) 811 MG chewable tablet Chew 125-250 mg by mouth every 6 (six) hours as needed for flatulence.     No current facility-administered medications for this visit.     Past Medical History:  Diagnosis Date  . A-fib ([place])   . Anxiety   . Bradycardia    s/p PPM October 2014  . Chronic anticoagulation   . Dysrhythmia   . Headache(784.0)   . High cholesterol   . Hypertension   .  Malignant neoplasm of upper-outer quadrant of right breast in female, estrogen receptor positive ([place]) 06/15/2018  . Multiple rib fractures 07/01/2016   fell down stairs   . Pulmonary contusion 06/2016   from a fall   . Stroke [facility]) September 2014   thrombolytic therapy  . Thyroid disease   . Vertical vertigo     ROS:   All systems reviewed and negative except as noted in the HPI.   Past Surgical History:  Procedure Laterality Date  . BREAST LUMPECTOMY WITH RADIOACTIVE SEED LOCALIZATION Right 07/13/2018   Procedure: BREAST LUMPECTOMY WITH RADIOACTIVE SEED LOCALIZATION;  Surgeon: Rolm Bookbinder, MD;  Location: [place];  Service: General;  Laterality: Right;  . CHOLECYSTECTOMY    . INSERT / REPLACE /  REMOVE PACEMAKER    . PACEMAKER INSERTION    . PERMANENT PACEMAKER INSERTION N/A 07/25/2013   Procedure: PERMANENT PACEMAKER INSERTION;  Surgeon: Deboraha Sprang, MD;  Location: [facility] CATH LAB;  Service: Cardiovascular;  Laterality: N/A;  . TEE WITHOUT CARDIOVERSION N/A 07/16/2013   Procedure: TRANSESOPHAGEAL ECHOCARDIOGRAM (TEE);  Surgeon: Thayer Headings, MD;  Location: [facility] ENDOSCOPY;  Service: Cardiovascular;  Laterality: N/A;     Family History  Problem Relation Age of Onset  . Cancer Mother   . Breast cancer Mother   . Cancer Father   . Lung cancer Father      Social History   Socioeconomic History  . Marital status: Married    Spouse name: leroy  . Number of children: 2  . Years of education: 39  . Highest education level: Not on file  Occupational History  . Occupation: retired  Tobacco Use  . Smoking status: Never Smoker  . Smokeless tobacco: Never Used  Vaping Use  . Vaping Use: Never used  Substance and Sexual Activity  . Alcohol use: No    Alcohol/week: 0.0 standard drinks  . Drug use: No  . Sexual activity: Never  Other Topics Concern  . Not on file  Social History Narrative   Patient lives at home with her husband.   Social Determinants of Health   Financial Resource Strain: Not on file  Food Insecurity: Not on file  Transportation Needs: Not on file  Physical Activity: Not on file  Stress: Not on file  Social Connections: Not on file  Intimate Partner Violence: Not on file     BP 140/82   Pulse 72   Ht 5\' 4"  (1.626 m)   Wt 131 lb 12.8 oz (59.8 kg)   SpO2 98%   BMI 22.62 kg/m   Physical Exam:  Well appearing elderly woman, NAD HEENT: Unremarkable Neck:  No JVD, no thyromegally Lymphatics:  No adenopathy Back:  No CVA tenderness Lungs:  Clear with no wheezes HEART:  Regular rate rhythm, no murmurs, no rubs, no clicks Abd:  soft, positive bowel sounds, no organomegally, no rebound, no guarding Ext:  2 plus pulses, no edema, no cyanosis, no  clubbing Skin:  No rashes no nodules Neuro:  CN II through XII intact, motor grossly intact  EKG - AV paced  DEVICE  Normal device function.  See PaceArt for details.   Assess/Plan: 1. Sinus node dysfunction/ChB - she is stable s/p PPM insertion. She has no atrial or ventricular escape. 2. PPM - her medtronic DDD PM has been reprogrammed from the AAIR/DDDR mode to the DDDR mode. 3. PAF - she is maintaining NSR nicely. She will reduce her dose of toprol to 25 bid 4. coags - she  has not had any bleeding with Eliquis.   Carleene Overlie Taylor,MD

## 2020-12-31 NOTE — Patient Instructions (Signed)
Medication Instructions:  Your physician recommends that you continue on your current medications as directed. Please refer to the Current Medication list given to you today.  *If you need a refill on your cardiac medications before your next appointment, please call your pharmacy*   Lab Work: NONE   If you have labs (blood work) drawn today and your tests are completely normal, you will receive your results only by: . MyChart Message (if you have MyChart) OR . A paper copy in the mail If you have any lab test that is abnormal or we need to change your treatment, we will call you to review the results.   Testing/Procedures: NONE    Follow-Up: At [facility], you and your health needs are our priority.  As part of our continuing mission to provide you with exceptional heart care, we have created designated Provider Care Teams.  These Care Teams include your primary Cardiologist (physician) and Advanced Practice Providers (APPs -  Physician Assistants and Nurse Practitioners) who all work together to provide you with the care you need, when you need it.  We recommend signing up for the patient portal called "MyChart".  Sign up information is provided on this After Visit Summary.  MyChart is used to connect with patients for Virtual Visits (Telemedicine).  Patients are able to view lab/test results, encounter notes, upcoming appointments, etc.  Non-urgent messages can be sent to your provider as well.   To learn more about what you can do with MyChart, go to [email or web address].    Your next appointment:   1 year(s)  The format for your next appointment:   In Person  Provider:   Gregg Taylor, MD   Other Instructions Thank you for choosing [facility]!    

## 2021-01-13 DIAGNOSIS — N183 Chronic kidney disease, stage 3 unspecified: Secondary | ICD-10-CM | POA: Diagnosis not present

## 2021-01-13 DIAGNOSIS — E7849 Other hyperlipidemia: Secondary | ICD-10-CM | POA: Diagnosis not present

## 2021-01-13 DIAGNOSIS — E7800 Pure hypercholesterolemia, unspecified: Secondary | ICD-10-CM | POA: Diagnosis not present

## 2021-01-22 DIAGNOSIS — S199XXA Unspecified injury of neck, initial encounter: Secondary | ICD-10-CM | POA: Diagnosis not present

## 2021-01-22 DIAGNOSIS — Z8673 Personal history of transient ischemic attack (TIA), and cerebral infarction without residual deficits: Secondary | ICD-10-CM | POA: Diagnosis not present

## 2021-01-22 DIAGNOSIS — S0093XA Contusion of unspecified part of head, initial encounter: Secondary | ICD-10-CM | POA: Diagnosis not present

## 2021-01-22 DIAGNOSIS — R519 Headache, unspecified: Secondary | ICD-10-CM | POA: Diagnosis not present

## 2021-01-22 DIAGNOSIS — S51012A Laceration without foreign body of left elbow, initial encounter: Secondary | ICD-10-CM | POA: Diagnosis not present

## 2021-01-22 DIAGNOSIS — S0990XA Unspecified injury of head, initial encounter: Secondary | ICD-10-CM | POA: Diagnosis not present

## 2021-01-22 DIAGNOSIS — R0781 Pleurodynia: Secondary | ICD-10-CM | POA: Diagnosis not present

## 2021-01-22 DIAGNOSIS — I10 Essential (primary) hypertension: Secondary | ICD-10-CM | POA: Diagnosis not present

## 2021-01-22 DIAGNOSIS — M25522 Pain in left elbow: Secondary | ICD-10-CM | POA: Diagnosis not present

## 2021-01-22 DIAGNOSIS — Z950 Presence of cardiac pacemaker: Secondary | ICD-10-CM | POA: Diagnosis not present

## 2021-01-22 DIAGNOSIS — S299XXA Unspecified injury of thorax, initial encounter: Secondary | ICD-10-CM | POA: Diagnosis not present

## 2021-01-28 DIAGNOSIS — I10 Essential (primary) hypertension: Secondary | ICD-10-CM | POA: Diagnosis not present

## 2021-01-28 DIAGNOSIS — I4891 Unspecified atrial fibrillation: Secondary | ICD-10-CM | POA: Diagnosis not present

## 2021-01-28 DIAGNOSIS — Z299 Encounter for prophylactic measures, unspecified: Secondary | ICD-10-CM | POA: Diagnosis not present

## 2021-01-28 DIAGNOSIS — W109XXA Fall (on) (from) unspecified stairs and steps, initial encounter: Secondary | ICD-10-CM | POA: Diagnosis not present

## 2021-02-13 DIAGNOSIS — E7849 Other hyperlipidemia: Secondary | ICD-10-CM | POA: Diagnosis not present

## 2021-02-13 DIAGNOSIS — J449 Chronic obstructive pulmonary disease, unspecified: Secondary | ICD-10-CM | POA: Diagnosis not present

## 2021-02-13 DIAGNOSIS — Z720 Tobacco use: Secondary | ICD-10-CM | POA: Diagnosis not present

## 2021-03-10 ENCOUNTER — Ambulatory Visit (INDEPENDENT_AMBULATORY_CARE_PROVIDER_SITE_OTHER): Payer: PPO

## 2021-03-10 DIAGNOSIS — I495 Sick sinus syndrome: Secondary | ICD-10-CM | POA: Diagnosis not present

## 2021-03-11 LAB — CUP PACEART REMOTE DEVICE CHECK
Battery Impedance: 725 Ohm
Battery Remaining Longevity: 68 mo
Battery Voltage: 2.77 V
Brady Statistic AP VP Percent: 100 %
Brady Statistic AP VS Percent: 0 %
Brady Statistic AS VP Percent: 0 %
Brady Statistic AS VS Percent: 0 %
Date Time Interrogation Session: 20220525100855
Implantable Lead Implant Date: 20141009
Implantable Lead Implant Date: 20141009
Implantable Lead Location: 753859
Implantable Lead Location: 753860
Implantable Lead Model: 1944
Implantable Lead Model: 1948
Implantable Pulse Generator Implant Date: 20141009
Lead Channel Impedance Value: 553 Ohm
Lead Channel Impedance Value: 898 Ohm
Lead Channel Pacing Threshold Amplitude: 0.75 V
Lead Channel Pacing Threshold Amplitude: 0.75 V
Lead Channel Pacing Threshold Pulse Width: 0.4 ms
Lead Channel Pacing Threshold Pulse Width: 0.4 ms
Lead Channel Setting Pacing Amplitude: 2 V
Lead Channel Setting Pacing Amplitude: 2.5 V
Lead Channel Setting Pacing Pulse Width: 0.4 ms
Lead Channel Setting Sensing Sensitivity: 5.6 mV

## 2021-04-02 NOTE — Progress Notes (Signed)
Remote pacemaker transmission.   

## 2021-04-03 ENCOUNTER — Other Ambulatory Visit: Payer: Self-pay | Admitting: Hematology and Oncology

## 2021-04-07 DIAGNOSIS — N183 Chronic kidney disease, stage 3 unspecified: Secondary | ICD-10-CM | POA: Diagnosis not present

## 2021-04-07 DIAGNOSIS — I10 Essential (primary) hypertension: Secondary | ICD-10-CM | POA: Diagnosis not present

## 2021-04-07 DIAGNOSIS — Z299 Encounter for prophylactic measures, unspecified: Secondary | ICD-10-CM | POA: Diagnosis not present

## 2021-04-07 DIAGNOSIS — D6869 Other thrombophilia: Secondary | ICD-10-CM | POA: Diagnosis not present

## 2021-04-07 DIAGNOSIS — I4891 Unspecified atrial fibrillation: Secondary | ICD-10-CM | POA: Diagnosis not present

## 2021-04-15 DIAGNOSIS — Z720 Tobacco use: Secondary | ICD-10-CM | POA: Diagnosis not present

## 2021-04-15 DIAGNOSIS — E7849 Other hyperlipidemia: Secondary | ICD-10-CM | POA: Diagnosis not present

## 2021-04-15 DIAGNOSIS — J449 Chronic obstructive pulmonary disease, unspecified: Secondary | ICD-10-CM | POA: Diagnosis not present

## 2021-04-26 ENCOUNTER — Telehealth: Payer: Self-pay | Admitting: Hematology and Oncology

## 2021-04-26 NOTE — Telephone Encounter (Signed)
Rescheduled per provider request called pt and left a msg

## 2021-04-28 ENCOUNTER — Ambulatory Visit: Payer: PPO | Admitting: Hematology and Oncology

## 2021-04-28 NOTE — Progress Notes (Signed)
Patient Care Team: Monico Blitz, MD as PCP - General (Internal Medicine) Evans Lance, MD as Consulting Physician (Cardiology) Rolm Bookbinder, MD as Consulting Physician (General Surgery) Nicholas Lose, MD as Consulting Physician (Hematology and Oncology) Kyung Rudd, MD as Consulting Physician (Radiation Oncology) Gardenia Phlegm, NP as Nurse Practitioner (Hematology and Oncology)  DIAGNOSIS:    ICD-10-CM   1. Malignant neoplasm of upper-outer quadrant of right breast in female, estrogen receptor positive ([place])  C50.411    Z17.0       SUMMARY OF ONCOLOGIC HISTORY: Oncology History  Malignant neoplasm of upper-outer quadrant of right breast in female, estrogen receptor positive ([place])  06/11/2018 Initial Diagnosis   Screening detected right breast asymmetry posteriorly measuring 0.3 cm at 12 o'clock position.  Biopsy revealed grade 1-2 invasive ductal carcinoma ER 100%, PR 80%, Ki-67 2%, HER-2 negative, T1 a N0 stage Ia    07/13/2018 Surgery   Right lumpectomy: IDC grade 2, 0.7 cm, cancer does not involve margins, negative for lymphovascular or perineural invasion, lymph nodes not sampled, ER 100%, PR 80%, HER-2 negative, Ki-67 2%, T1B NX stage Ia    07/25/2018 Cancer Staging   Staging form: Breast, AJCC 8th Edition - Pathologic: Stage IA (pT1b, pN0, cM0, G2, ER+, PR+, HER2-) - Signed by Gardenia Phlegm, NP on 07/25/2018    07/2018 -  Anti-estrogen oral therapy   Letrozole daily      CHIEF COMPLIANT: Follow-up of left breast cancer on letrozole  INTERVAL HISTORY: Jessica Myers is a 87 y.o. with above-mentioned history of left breast cancer treated with lumpectomy and who is currently on anti-estrogen therapy with letrozole. Mammogram on 07/06/20 showed no evidence of malignancy bilaterally. She presents to the clinic today for annual follow-up.  She complains of soreness in the right arm because she has had 3 COVID injections and a flu shot recently.   Other than that she denies any problems or concerns.  Denies any problems with the letrozole.  ALLERGIES:  is allergic to biaxin [clarithromycin], penicillins, shellfish allergy, sulfa antibiotics, and celebrex [celecoxib].  MEDICATIONS:  Current Outpatient Medications  Medication Sig Dispense Refill   acetaminophen (TYLENOL) 500 MG tablet Take 500-1,000 mg by mouth every 6 (six) hours as needed for mild pain.     atorvastatin (LIPITOR) 10 MG tablet Take 10 mg by mouth every evening.      calcium carbonate (OSCAL) 1500 (600 Ca) MG TABS tablet Take by mouth 2 (two) times daily with a meal.     carbamide peroxide (DEBROX) 6.5 % OTIC solution Place 5-10 drops into both ears 2 (two) times daily as needed (wax build up).     cholecalciferol (VITAMIN D) 1000 UNITS tablet Take 1,000 Units by mouth daily.      ELIQUIS 5 MG TABS tablet TAKE 1 TABLET BY MOUTH TWICE A DAY 180 tablet 2   flecainide (TAMBOCOR) 150 MG tablet TAKE 1/2 TABLETS (75 MG TOTAL) BY MOUTH 2 (TWO) TIMES DAILY. PLEASE KEEP UPCOMING APPT IN FEBRUARY 90 tablet 3   letrozole (FEMARA) 2.5 MG tablet TAKE 1 TABLET BY MOUTH EVERY DAY 90 tablet 3   levothyroxine (SYNTHROID, LEVOTHROID) 112 MCG tablet Take 112 mcg by mouth daily before breakfast.  2   lisinopril (PRINIVIL,ZESTRIL) 10 MG tablet Take 10 mg by mouth 2 (two) times daily.     meclizine (ANTIVERT) 25 MG tablet Take 25 mg by mouth 3 (three) times daily as needed for dizziness.     metoprolol succinate (TOPROL-XL) 25  MG 24 hr tablet TAKE 1 TABLET (25 MG TOTAL) BY MOUTH IN THE MORNING AND AT BEDTIME. (Patient taking differently: Take 25 mg by mouth in the morning, at noon, and at bedtime.) 180 tablet 3   omeprazole (PRILOSEC) 20 MG capsule Take 20 mg by mouth at bedtime.      Polyethyl Glycol-Propyl Glycol 0.4-0.3 % SOLN Place 1 drop into both eyes 3 (three) times daily as needed (for dry eyes.).     polyethylene glycol (MIRALAX / GLYCOLAX) packet Take 17 g by mouth daily as needed (for  constipation.).      simethicone (MYLICON) 220 MG chewable tablet Chew 125-250 mg by mouth every 6 (six) hours as needed for flatulence.     No current facility-administered medications for this visit.    PHYSICAL EXAMINATION: ECOG PERFORMANCE STATUS: 1 - Symptomatic but completely ambulatory  Vitals:   04/29/21 1042  BP: (!) 142/79  Pulse: 88  Resp: 18  Temp: 98.1 F (36.7 C)  SpO2: 100%   Filed Weights   04/29/21 1042  Weight: 131 lb 14.4 oz (59.8 kg)    BREAST: No palpable masses or nodules in either right or left breasts. No palpable axillary supraclavicular or infraclavicular adenopathy no breast tenderness or nipple discharge. (exam performed in the presence of a chaperone)  LABORATORY DATA:  I have reviewed the data as listed CMP Latest Ref Rng & Units 07/06/2018 06/20/2018 12/13/2017  Glucose 70 - 99 mg/dL 98 83 85  BUN 8 - 23 mg/dL 25(H) 25(H) 19  Creatinine 0.44 - 1.00 mg/dL 1.48(H) 1.34(H) 1.24(H)  Sodium 135 - 145 mmol/L 139 141 139  Potassium 3.5 - 5.1 mmol/L 4.7 4.9 4.3  Chloride 98 - 111 mmol/L 105 106 103  CO2 22 - 32 mmol/L _0 Calcium 8.9 - 10.3 mg/dL 9.3 10.1 9.7  Total Protein 6.5 - 8.1 g/dL - 7.3 -  Total Bilirubin 0.3 - 1.2 mg/dL - 0.8 -  Alkaline Phos 38 - 126 U/L - 83 -  AST 15 - 41 U/L - 17 -  ALT 0 - 44 U/L - 11 -    Lab Results  Component Value Date   WBC 5.9 07/06/2018   HGB 12.3 07/06/2018   HCT 38.7 07/06/2018   MCV 98.7 07/06/2018   PLT 170 07/06/2018   NEUTROABS 3.0 06/20/2018    ASSESSMENT & PLAN:  Malignant neoplasm of upper-outer quadrant of right breast in female, estrogen receptor positive ([place]) 07/13/2018: Right lumpectomy: IDC grade 2, 0.7 cm, cancer does not involve margins, negative for lymphovascular or perineural invasion, lymph nodes not sampled, ER 100%, PR 80%, HER-2 negative, Ki-67 2%, T1B NX stage Ia   Current treatment: Adjuvant antiestrogen therapy with letrozole 2.5 mg daily x5 years started Oct  2019 Letrozole Toxicities: No side effects Enjoys gardening.   Breast cancer Surveillance: 1. Mammogram: 07/06/2020: [facility] benign breast density category B 2. Dexa Scan 09/11/18: T score -1.8   3.  Breast exam 04/29/2021: Benign   RTC in 1 year    No orders of the defined types were placed in this encounter.  The patient has a good understanding of the overall plan. she agrees with it. she will call with any problems that may develop before the next visit here.  Total time spent: 20 mins including face to face time and time spent for planning, charting and coordination of care  Rulon Eisenmenger, MD, MPH 04/29/2021  I, Thana Ates, am acting as scribe for Dr.  Nicholas Lose.  I have reviewed the above documentation for accuracy and completeness, and I agree with the above.

## 2021-04-29 ENCOUNTER — Other Ambulatory Visit: Payer: Self-pay

## 2021-04-29 ENCOUNTER — Inpatient Hospital Stay: Payer: PPO | Attending: Hematology and Oncology | Admitting: Hematology and Oncology

## 2021-04-29 DIAGNOSIS — Z79899 Other long term (current) drug therapy: Secondary | ICD-10-CM | POA: Insufficient documentation

## 2021-04-29 DIAGNOSIS — Z7901 Long term (current) use of anticoagulants: Secondary | ICD-10-CM | POA: Insufficient documentation

## 2021-04-29 DIAGNOSIS — Z79811 Long term (current) use of aromatase inhibitors: Secondary | ICD-10-CM | POA: Diagnosis not present

## 2021-04-29 DIAGNOSIS — Z170 Estrogen receptor positive status [ER+]: Secondary | ICD-10-CM | POA: Diagnosis not present

## 2021-04-29 DIAGNOSIS — C50411 Malignant neoplasm of upper-outer quadrant of right female breast: Secondary | ICD-10-CM | POA: Insufficient documentation

## 2021-04-29 NOTE — Assessment & Plan Note (Signed)
07/13/2018:Right lumpectomy: IDC grade 2, 0.7 cm, cancer does not involve margins, negative for lymphovascular or perineural invasion, lymph nodes not sampled, ER 100%, PR 80%, HER-2 negative, Ki-67 2%, T1B NX stage Ia  Current treatment: Adjuvant antiestrogen therapy with letrozole 2.5 mg daily x5 yearsstarted Oct 2019 Letrozole Toxicities:No side effects Enjoys gardening.  Breast cancer Surveillance: 1. Mammogram: 07/06/2020: [facility] benign breast density category B 2. Dexa Scan 09/11/18: T score -1.8  3.  Breast exam 04/29/2021: Benign  RTC in 1 year

## 2021-05-04 ENCOUNTER — Telehealth: Payer: Self-pay | Admitting: Hematology and Oncology

## 2021-05-04 NOTE — Telephone Encounter (Signed)
Scheduled appointment per 07/14 los. Patient is aware. 

## 2021-05-25 DIAGNOSIS — E039 Hypothyroidism, unspecified: Secondary | ICD-10-CM | POA: Diagnosis not present

## 2021-05-25 DIAGNOSIS — N183 Chronic kidney disease, stage 3 unspecified: Secondary | ICD-10-CM | POA: Diagnosis not present

## 2021-05-25 DIAGNOSIS — I4891 Unspecified atrial fibrillation: Secondary | ICD-10-CM | POA: Diagnosis not present

## 2021-05-25 DIAGNOSIS — Z299 Encounter for prophylactic measures, unspecified: Secondary | ICD-10-CM | POA: Diagnosis not present

## 2021-05-25 DIAGNOSIS — Z789 Other specified health status: Secondary | ICD-10-CM | POA: Diagnosis not present

## 2021-05-25 DIAGNOSIS — I10 Essential (primary) hypertension: Secondary | ICD-10-CM | POA: Diagnosis not present

## 2021-06-09 ENCOUNTER — Ambulatory Visit (INDEPENDENT_AMBULATORY_CARE_PROVIDER_SITE_OTHER): Payer: PPO

## 2021-06-09 DIAGNOSIS — I495 Sick sinus syndrome: Secondary | ICD-10-CM | POA: Diagnosis not present

## 2021-06-10 ENCOUNTER — Telehealth: Payer: Self-pay | Admitting: Internal Medicine

## 2021-06-10 LAB — CUP PACEART REMOTE DEVICE CHECK
Battery Impedance: 852 Ohm
Battery Remaining Longevity: 62 mo
Battery Voltage: 2.77 V
Brady Statistic AP VP Percent: 100 %
Brady Statistic AP VS Percent: 0 %
Brady Statistic AS VP Percent: 0 %
Brady Statistic AS VS Percent: 0 %
Date Time Interrogation Session: 20220825110902
Implantable Lead Implant Date: 20141009
Implantable Lead Implant Date: 20141009
Implantable Lead Location: 753859
Implantable Lead Location: 753860
Implantable Lead Model: 1944
Implantable Lead Model: 1948
Implantable Pulse Generator Implant Date: 20141009
Lead Channel Impedance Value: 528 Ohm
Lead Channel Impedance Value: 819 Ohm
Lead Channel Pacing Threshold Amplitude: 0.75 V
Lead Channel Pacing Threshold Amplitude: 0.75 V
Lead Channel Pacing Threshold Pulse Width: 0.4 ms
Lead Channel Pacing Threshold Pulse Width: 0.4 ms
Lead Channel Setting Pacing Amplitude: 2 V
Lead Channel Setting Pacing Amplitude: 2.5 V
Lead Channel Setting Pacing Pulse Width: 0.4 ms
Lead Channel Setting Sensing Sensitivity: 5.6 mV

## 2021-06-10 NOTE — Telephone Encounter (Signed)
I called the patient and let her know we did receive her transmission. The patient verbalized understanding and thanked me for the call.

## 2021-06-10 NOTE — Telephone Encounter (Signed)
  1. Has your device fired? No  2. Is you device beeping? no  3. Are you experiencing draining or swelling at device site?  no 4. Are you calling to see if we received your device transmission? yes  5. Have you passed out? no    Patient, says she is sending her transmission today   Please route to [place]

## 2021-06-16 DIAGNOSIS — Z720 Tobacco use: Secondary | ICD-10-CM | POA: Diagnosis not present

## 2021-06-16 DIAGNOSIS — J449 Chronic obstructive pulmonary disease, unspecified: Secondary | ICD-10-CM | POA: Diagnosis not present

## 2021-06-16 DIAGNOSIS — E7849 Other hyperlipidemia: Secondary | ICD-10-CM | POA: Diagnosis not present

## 2021-06-24 NOTE — Progress Notes (Signed)
Remote pacemaker transmission.   

## 2021-07-14 DIAGNOSIS — Z803 Family history of malignant neoplasm of breast: Secondary | ICD-10-CM | POA: Diagnosis not present

## 2021-07-14 DIAGNOSIS — Z853 Personal history of malignant neoplasm of breast: Secondary | ICD-10-CM | POA: Diagnosis not present

## 2021-07-17 ENCOUNTER — Other Ambulatory Visit: Payer: Self-pay | Admitting: Internal Medicine

## 2021-07-17 DIAGNOSIS — I4891 Unspecified atrial fibrillation: Secondary | ICD-10-CM

## 2021-08-23 DIAGNOSIS — E7800 Pure hypercholesterolemia, unspecified: Secondary | ICD-10-CM | POA: Diagnosis not present

## 2021-08-23 DIAGNOSIS — Z7189 Other specified counseling: Secondary | ICD-10-CM | POA: Diagnosis not present

## 2021-08-23 DIAGNOSIS — I10 Essential (primary) hypertension: Secondary | ICD-10-CM | POA: Diagnosis not present

## 2021-08-23 DIAGNOSIS — Z6823 Body mass index (BMI) 23.0-23.9, adult: Secondary | ICD-10-CM | POA: Diagnosis not present

## 2021-08-23 DIAGNOSIS — Z299 Encounter for prophylactic measures, unspecified: Secondary | ICD-10-CM | POA: Diagnosis not present

## 2021-08-23 DIAGNOSIS — Z0000 Encounter for general adult medical examination without abnormal findings: Secondary | ICD-10-CM | POA: Diagnosis not present

## 2021-08-23 DIAGNOSIS — R5383 Other fatigue: Secondary | ICD-10-CM | POA: Diagnosis not present

## 2021-08-23 DIAGNOSIS — Z79899 Other long term (current) drug therapy: Secondary | ICD-10-CM | POA: Diagnosis not present

## 2021-08-23 DIAGNOSIS — E559 Vitamin D deficiency, unspecified: Secondary | ICD-10-CM | POA: Diagnosis not present

## 2021-08-23 DIAGNOSIS — Z1331 Encounter for screening for depression: Secondary | ICD-10-CM | POA: Diagnosis not present

## 2021-08-23 DIAGNOSIS — Z1339 Encounter for screening examination for other mental health and behavioral disorders: Secondary | ICD-10-CM | POA: Diagnosis not present

## 2021-09-08 ENCOUNTER — Ambulatory Visit (INDEPENDENT_AMBULATORY_CARE_PROVIDER_SITE_OTHER): Payer: PPO

## 2021-09-08 DIAGNOSIS — I495 Sick sinus syndrome: Secondary | ICD-10-CM

## 2021-09-08 LAB — CUP PACEART REMOTE DEVICE CHECK
Battery Impedance: 931 Ohm
Battery Remaining Longevity: 60 mo
Battery Voltage: 2.76 V
Brady Statistic AP VP Percent: 100 %
Brady Statistic AP VS Percent: 0 %
Brady Statistic AS VP Percent: 0 %
Brady Statistic AS VS Percent: 0 %
Date Time Interrogation Session: 20221123085318
Implantable Lead Implant Date: 20141009
Implantable Lead Implant Date: 20141009
Implantable Lead Location: 753859
Implantable Lead Location: 753860
Implantable Lead Model: 1944
Implantable Lead Model: 1948
Implantable Pulse Generator Implant Date: 20141009
Lead Channel Impedance Value: 553 Ohm
Lead Channel Impedance Value: 888 Ohm
Lead Channel Pacing Threshold Amplitude: 0.75 V
Lead Channel Pacing Threshold Amplitude: 0.75 V
Lead Channel Pacing Threshold Pulse Width: 0.4 ms
Lead Channel Pacing Threshold Pulse Width: 0.4 ms
Lead Channel Setting Pacing Amplitude: 2 V
Lead Channel Setting Pacing Amplitude: 2.5 V
Lead Channel Setting Pacing Pulse Width: 0.4 ms
Lead Channel Setting Sensing Sensitivity: 5.6 mV

## 2021-09-20 NOTE — Progress Notes (Signed)
Remote pacemaker transmission.   

## 2021-10-19 DIAGNOSIS — Z789 Other specified health status: Secondary | ICD-10-CM | POA: Diagnosis not present

## 2021-10-19 DIAGNOSIS — U071 COVID-19: Secondary | ICD-10-CM | POA: Diagnosis not present

## 2021-10-19 DIAGNOSIS — Z6823 Body mass index (BMI) 23.0-23.9, adult: Secondary | ICD-10-CM | POA: Diagnosis not present

## 2021-10-19 DIAGNOSIS — Z713 Dietary counseling and surveillance: Secondary | ICD-10-CM | POA: Diagnosis not present

## 2021-10-19 DIAGNOSIS — Z299 Encounter for prophylactic measures, unspecified: Secondary | ICD-10-CM | POA: Diagnosis not present

## 2021-11-16 DIAGNOSIS — I10 Essential (primary) hypertension: Secondary | ICD-10-CM | POA: Diagnosis not present

## 2021-11-16 DIAGNOSIS — E782 Mixed hyperlipidemia: Secondary | ICD-10-CM | POA: Diagnosis not present

## 2021-12-08 ENCOUNTER — Ambulatory Visit (INDEPENDENT_AMBULATORY_CARE_PROVIDER_SITE_OTHER): Payer: PPO

## 2021-12-08 DIAGNOSIS — I495 Sick sinus syndrome: Secondary | ICD-10-CM | POA: Diagnosis not present

## 2021-12-09 LAB — CUP PACEART REMOTE DEVICE CHECK
Battery Impedance: 1035 Ohm
Battery Impedance: 1008 Ohm
Battery Remaining Longevity: 56 mo
Battery Remaining Longevity: 57 mo
Battery Voltage: 2.77 V
Battery Voltage: 2.77 V
Brady Statistic AP VP Percent: 100 %
Brady Statistic AP VP Percent: 100 %
Brady Statistic AP VS Percent: 0 %
Brady Statistic AP VS Percent: 0 %
Brady Statistic AS VP Percent: 0 %
Brady Statistic AS VP Percent: 0 %
Brady Statistic AS VS Percent: 0 %
Brady Statistic AS VS Percent: 0 %
Date Time Interrogation Session: 20230222084007
Date Time Interrogation Session: 20230223094737
Implantable Lead Implant Date: 20141009
Implantable Lead Implant Date: 20141009
Implantable Lead Implant Date: 20141009
Implantable Lead Implant Date: 20141009
Implantable Lead Location: 753859
Implantable Lead Location: 753860
Implantable Lead Location: 753859
Implantable Lead Location: 753860
Implantable Lead Model: 1944
Implantable Lead Model: 1948
Implantable Lead Model: 1944
Implantable Lead Model: 1948
Implantable Pulse Generator Implant Date: 20141009
Implantable Pulse Generator Implant Date: 20141009
Lead Channel Impedance Value: 562 Ohm
Lead Channel Impedance Value: 913 Ohm
Lead Channel Impedance Value: 553 Ohm
Lead Channel Impedance Value: 913 Ohm
Lead Channel Pacing Threshold Amplitude: 0.625 V
Lead Channel Pacing Threshold Amplitude: 0.875 V
Lead Channel Pacing Threshold Amplitude: 0.625 V
Lead Channel Pacing Threshold Amplitude: 0.875 V
Lead Channel Pacing Threshold Pulse Width: 0.4 ms
Lead Channel Pacing Threshold Pulse Width: 0.4 ms
Lead Channel Pacing Threshold Pulse Width: 0.4 ms
Lead Channel Pacing Threshold Pulse Width: 0.4 ms
Lead Channel Setting Pacing Amplitude: 2 V
Lead Channel Setting Pacing Amplitude: 2.5 V
Lead Channel Setting Pacing Amplitude: 2 V
Lead Channel Setting Pacing Amplitude: 2.5 V
Lead Channel Setting Pacing Pulse Width: 0.4 ms
Lead Channel Setting Pacing Pulse Width: 0.4 ms
Lead Channel Setting Sensing Sensitivity: 5.6 mV
Lead Channel Setting Sensing Sensitivity: 5.6 mV

## 2021-12-13 DIAGNOSIS — H353131 Nonexudative age-related macular degeneration, bilateral, early dry stage: Secondary | ICD-10-CM | POA: Diagnosis not present

## 2021-12-13 DIAGNOSIS — H26491 Other secondary cataract, right eye: Secondary | ICD-10-CM | POA: Diagnosis not present

## 2021-12-15 NOTE — Progress Notes (Signed)
Remote pacemaker transmission.   

## 2021-12-21 DIAGNOSIS — Z299 Encounter for prophylactic measures, unspecified: Secondary | ICD-10-CM | POA: Diagnosis not present

## 2021-12-21 DIAGNOSIS — R350 Frequency of micturition: Secondary | ICD-10-CM | POA: Diagnosis not present

## 2021-12-21 DIAGNOSIS — I10 Essential (primary) hypertension: Secondary | ICD-10-CM | POA: Diagnosis not present

## 2021-12-21 DIAGNOSIS — N183 Chronic kidney disease, stage 3 unspecified: Secondary | ICD-10-CM | POA: Diagnosis not present

## 2021-12-21 DIAGNOSIS — C50911 Malignant neoplasm of unspecified site of right female breast: Secondary | ICD-10-CM | POA: Diagnosis not present

## 2021-12-21 DIAGNOSIS — Z789 Other specified health status: Secondary | ICD-10-CM | POA: Diagnosis not present

## 2021-12-21 DIAGNOSIS — Z6823 Body mass index (BMI) 23.0-23.9, adult: Secondary | ICD-10-CM | POA: Diagnosis not present

## 2022-01-04 ENCOUNTER — Encounter: Payer: Self-pay | Admitting: Internal Medicine

## 2022-01-04 ENCOUNTER — Other Ambulatory Visit: Payer: Self-pay

## 2022-01-04 ENCOUNTER — Ambulatory Visit (INDEPENDENT_AMBULATORY_CARE_PROVIDER_SITE_OTHER): Payer: PPO | Admitting: Internal Medicine

## 2022-01-04 ENCOUNTER — Encounter: Payer: PPO | Admitting: Internal Medicine

## 2022-01-04 VITALS — BP 130/78 | HR 66 | Ht 64.0 in | Wt 132.2 lb

## 2022-01-04 DIAGNOSIS — I495 Sick sinus syndrome: Secondary | ICD-10-CM

## 2022-01-04 DIAGNOSIS — I4891 Unspecified atrial fibrillation: Secondary | ICD-10-CM | POA: Diagnosis not present

## 2022-01-04 LAB — CUP PACEART INCLINIC DEVICE CHECK
Battery Impedance: 1061 Ohm
Battery Remaining Longevity: 55 mo
Battery Voltage: 2.76 V
Brady Statistic AP VP Percent: 100 %
Brady Statistic AP VS Percent: 0 %
Brady Statistic AS VP Percent: 0 %
Brady Statistic AS VS Percent: 0 %
Date Time Interrogation Session: 20230321151014
Implantable Lead Implant Date: 20141009
Implantable Lead Implant Date: 20141009
Implantable Lead Location: 753859
Implantable Lead Location: 753860
Implantable Lead Model: 1944
Implantable Lead Model: 1948
Implantable Pulse Generator Implant Date: 20141009
Lead Channel Impedance Value: 553 Ohm
Lead Channel Impedance Value: 856 Ohm
Lead Channel Pacing Threshold Amplitude: 0.625 V
Lead Channel Pacing Threshold Amplitude: 0.75 V
Lead Channel Pacing Threshold Amplitude: 0.75 V
Lead Channel Pacing Threshold Amplitude: 0.875 V
Lead Channel Pacing Threshold Pulse Width: 0.4 ms
Lead Channel Pacing Threshold Pulse Width: 0.4 ms
Lead Channel Pacing Threshold Pulse Width: 0.4 ms
Lead Channel Pacing Threshold Pulse Width: 0.4 ms
Lead Channel Sensing Intrinsic Amplitude: 2.8 mV
Lead Channel Setting Pacing Amplitude: 2 V
Lead Channel Setting Pacing Amplitude: 2.5 V
Lead Channel Setting Pacing Pulse Width: 0.4 ms
Lead Channel Setting Sensing Sensitivity: 5.6 mV

## 2022-01-04 MED ORDER — METOPROLOL SUCCINATE ER 25 MG PO TB24: ORAL_TABLET | ORAL | 11 refills | Status: DC

## 2022-01-04 NOTE — Progress Notes (Signed)
? ? ? ? ?HPI ?Jessica Myers returns today for followup. She is a pleasant 88 yo woman with a h/o sinus node dysfunction, s/p PPM insertion, PAF, and HTN. In the interim, she has done well except that she has occaisional palpitations for which she will take an extra toprol. No syncope. No palpitations ?Allergies  ?Allergen Reactions  ? Biaxin [Clarithromycin] Shortness Of Breath and Rash  ?  Unknown  ? Penicillins Hives  ?  Has patient had a PCN reaction causing immediate rash, facial/tongue/throat swelling, SOB or lightheadedness with hypotension: Yes ?Has patient had a PCN reaction causing severe rash involving mucus membranes or skin necrosis: No ?Has patient had a PCN reaction that required hospitalization No ?Has patient had a PCN reaction occurring within the last 10 years: No ?If all of the above answers are "NO", then may proceed with Cephalosporin use. ? ?  ? Shellfish Allergy Nausea And Vomiting  ? Sulfa Antibiotics Other (See Comments)  ?  "bad reaction," per patient  ? Celebrex [Celecoxib] Rash  ? ? ? ?Current Outpatient Medications  ?Medication Sig Dispense Refill  ? acetaminophen (TYLENOL) 500 MG tablet Take 500-1,000 mg by mouth every 6 (six) hours as needed for mild pain.    ? atorvastatin (LIPITOR) 10 MG tablet Take 10 mg by mouth every evening.     ? calcium carbonate (OSCAL) 1500 (600 Ca) MG TABS tablet Take by mouth 2 (two) times daily with a meal.    ? carbamide peroxide (DEBROX) 6.5 % OTIC solution Place 5-10 drops into both ears 2 (two) times daily as needed (wax build up).    ? cholecalciferol (VITAMIN D) 1000 UNITS tablet Take 1,000 Units by mouth daily.     ? ELIQUIS 5 MG TABS tablet TAKE 1 TABLET BY MOUTH TWICE A DAY 180 tablet 2  ? flecainide (TAMBOCOR) 150 MG tablet TAKE 1/2 TABLETS (75 MG TOTAL) BY MOUTH 2 (TWO) TIMES DAILY. 90 tablet 3  ? letrozole (FEMARA) 2.5 MG tablet TAKE 1 TABLET BY MOUTH EVERY DAY 90 tablet 3  ? levothyroxine (SYNTHROID, LEVOTHROID) 112 MCG tablet Take 112 mcg by  mouth daily before breakfast.  2  ? lisinopril (PRINIVIL,ZESTRIL) 10 MG tablet Take 10 mg by mouth 2 (two) times daily.    ? meclizine (ANTIVERT) 25 MG tablet Take 25 mg by mouth 3 (three) times daily as needed for dizziness.    ? metoprolol succinate (TOPROL-XL) 25 MG 24 hr tablet TAKE 1 TABLET (25 MG TOTAL) BY MOUTH IN THE MORNING AND AT BEDTIME. 180 tablet 3  ? omeprazole (PRILOSEC) 20 MG capsule Take 20 mg by mouth at bedtime.     ? Polyethyl Glycol-Propyl Glycol 0.4-0.3 % SOLN Place 1 drop into both eyes 3 (three) times daily as needed (for dry eyes.).    ? polyethylene glycol (MIRALAX / GLYCOLAX) packet Take 17 g by mouth daily as needed (for constipation.).     ? simethicone (MYLICON) 628 MG chewable tablet Chew 125-250 mg by mouth every 6 (six) hours as needed for flatulence.    ? ?No current facility-administered medications for this visit.  ? ? ? ?Past Medical History:  ?Diagnosis Date  ? A-fib ([place])   ? Anxiety   ? Bradycardia   ? s/p PPM October 2014  ? Chronic anticoagulation   ? Dysrhythmia   ? Headache(784.0)   ? High cholesterol   ? Hypertension   ? Malignant neoplasm of upper-outer quadrant of right breast in female, estrogen receptor positive ([place])  06/15/2018  ? Multiple rib fractures 07/01/2016  ? fell down stairs   ? Pulmonary contusion 06/2016  ? from a fall   ? Stroke [facility]) September 2014  ? thrombolytic therapy  ? Thyroid disease   ? Vertical vertigo   ? ? ?ROS: ? ? All systems reviewed and negative except as noted in the HPI. ? ? ?Past Surgical History:  ?Procedure Laterality Date  ? BREAST LUMPECTOMY WITH RADIOACTIVE SEED LOCALIZATION Right 07/13/2018  ? Procedure: BREAST LUMPECTOMY WITH RADIOACTIVE SEED LOCALIZATION;  Surgeon: Rolm Bookbinder, MD;  Location: [place];  Service: General;  Laterality: Right;  ? CHOLECYSTECTOMY    ? INSERT / REPLACE / REMOVE PACEMAKER    ? PACEMAKER INSERTION    ? PERMANENT PACEMAKER INSERTION N/A 07/25/2013  ? Procedure: PERMANENT PACEMAKER INSERTION;  Surgeon:  Deboraha Sprang, MD;  [facility] C[facility] LAB;  Service: Cardiovascular;  Laterality: N/A;  ? TEE WITHOUT CARDIOVERSION N/A 07/16/2013  ? Procedure: TRANSESOPHAGEAL ECHOCARDIOGRAM (TEE);  Surgeon: Thayer Headings, MD;  Location: [place];  Service: Cardiovascular;  Laterality: N/A;  ? ? ? ?Family History  ?Problem Relation Age of Onset  ? Cancer Mother   ? Breast cancer Mother   ? Cancer Father   ? Lung cancer Father   ? ? ? ?Social History  ? ?Socioeconomic History  ? Marital status: Married  ?  Spouse name: leroy  ? Number of children: 2  ? Years of education: 41  ? Highest education level: Not on file  ?Occupational History  ? Occupation: retired  ?Tobacco Use  ? Smoking status: Never  ? Smokeless tobacco: Never  ?Vaping Use  ? Vaping Use: Never used  ?Substance and Sexual Activity  ? Alcohol use: No  ?  Alcohol/week: 0.0 standard drinks  ? Drug use: No  ? Sexual activity: Never  ?Other Topics Concern  ? Not on file  ?Social History Narrative  ? Patient lives at home with her husband.  ? ?Social Determinants of Health  ? ?Financial Resource Strain: Not on file  ?Food Insecurity: Not on file  ?Transportation Needs: Not on file  ?Physical Activity: Not on file  ?Stress: Not on file  ?Social Connections: Not on file  ?Intimate Partner Violence: Not on file  ? ? ? ?BP 130/78   Pulse 66   Ht '5\' 4"'$  (1.626 m)   Wt 132 lb 3.2 oz (60 kg)   SpO2 99%   BMI 22.69 kg/m?  ? ?Physical Exam: ? ?Well appearing elderly woman, NAD ?HEENT: Unremarkable ?Neck:  No JVD, no thyromegally ?Lymphatics:  No adenopathy ?Back:  No CVA tenderness ?Lungs:  Clear with no wheezes ?HEART:  Regular rate rhythm, no murmurs, no rubs, no clicks ?Abd:  soft, positive bowel sounds, no organomegally, no rebound, no guarding ?Ext:  2 plus pulses, no edema, no cyanosis, no clubbing ?Skin:  No rashes no nodules ?Neuro:  CN II through XII intact, motor grossly intact ? ?EKG - NSR with AV pacing ? ?DEVICE  ?Normal device function.  See PaceArt for  details.  ? ?Assess/Plan:  ?1. Sinus node dysfunction/ChB - she is stable s/p PPM insertion. She has an atrial and ventricular escape in the 35-40 range today. ?2. PPM - her medtronic DDD PM has been reprogrammed from the AAIR/DDDR mode to the DDDR mode. ?3. PAF - she is maintaining NSR nicely. She will reduce her dose of toprol to 25 bid with an extra dose as needed.  ?4. coags - she has  not had any bleeding with Eliquis.  ?  ?Carleene Overlie Edd Reppert,MD ?

## 2022-01-04 NOTE — Patient Instructions (Signed)
Medication Instructions:  ? ?Take Toprol XL 25 mg 2-3 Times Daily as Directed ? ?*If you need a refill on your cardiac medications before your next appointment, please call your pharmacy* ? ? ?Lab Work: ?NONE  ? ?If you have labs (blood work) drawn today and your tests are completely normal, you will receive your results only by: ?MyChart Message (if you have MyChart) OR ?A paper copy in the mail ?If you have any lab test that is abnormal or we need to change your treatment, we will call you to review the results. ? ? ?Testing/Procedures: ?NONE  ? ? ?Follow-Up: ?At [facility], you and your health needs are our priority.  As part of our continuing mission to provide you with exceptional heart care, we have created designated Provider Care Teams.  These Care Teams include your primary Cardiologist (physician) and Advanced Practice Providers (APPs -  Physician Assistants and Nurse Practitioners) who all work together to provide you with the care you need, when you need it. ? ?We recommend signing up for the patient portal called "MyChart".  Sign up information is provided on this After Visit Summary.  MyChart is used to connect with patients for Virtual Visits (Telemedicine).  Patients are able to view lab/test results, encounter notes, upcoming appointments, etc.  Non-urgent messages can be sent to your provider as well.   ?To learn more about what you can do with MyChart, go to [email or web address].   ? ?Your next appointment:   ?1 year(s) ? ?The format for your next appointment:   ?In Person ? ?Provider:   ?Cristopher Peru, MD  ? ? ?Other Instructions ?Thank you for choosing [facility]! ?  ? ? ?

## 2022-02-13 DIAGNOSIS — E782 Mixed hyperlipidemia: Secondary | ICD-10-CM | POA: Diagnosis not present

## 2022-02-13 DIAGNOSIS — I10 Essential (primary) hypertension: Secondary | ICD-10-CM | POA: Diagnosis not present

## 2022-03-09 ENCOUNTER — Ambulatory Visit (INDEPENDENT_AMBULATORY_CARE_PROVIDER_SITE_OTHER): Payer: PPO

## 2022-03-09 DIAGNOSIS — I495 Sick sinus syndrome: Secondary | ICD-10-CM

## 2022-03-10 LAB — CUP PACEART REMOTE DEVICE CHECK
Battery Impedance: 1194 Ohm
Battery Remaining Longevity: 50 mo
Battery Voltage: 2.76 V
Brady Statistic AP VP Percent: 100 %
Brady Statistic AP VS Percent: 0 %
Brady Statistic AS VP Percent: 0 %
Brady Statistic AS VS Percent: 0 %
Date Time Interrogation Session: 20230524130403
Implantable Lead Implant Date: 20141009
Implantable Lead Implant Date: 20141009
Implantable Lead Location: 753859
Implantable Lead Location: 753860
Implantable Lead Model: 1944
Implantable Lead Model: 1948
Implantable Pulse Generator Implant Date: 20141009
Lead Channel Impedance Value: 536 Ohm
Lead Channel Impedance Value: 850 Ohm
Lead Channel Pacing Threshold Amplitude: 0.625 V
Lead Channel Pacing Threshold Amplitude: 0.875 V
Lead Channel Pacing Threshold Pulse Width: 0.4 ms
Lead Channel Pacing Threshold Pulse Width: 0.4 ms
Lead Channel Setting Pacing Amplitude: 2 V
Lead Channel Setting Pacing Amplitude: 2.5 V
Lead Channel Setting Pacing Pulse Width: 0.4 ms
Lead Channel Setting Sensing Sensitivity: 5.6 mV

## 2022-03-15 DIAGNOSIS — Z6823 Body mass index (BMI) 23.0-23.9, adult: Secondary | ICD-10-CM | POA: Diagnosis not present

## 2022-03-15 DIAGNOSIS — I4891 Unspecified atrial fibrillation: Secondary | ICD-10-CM | POA: Diagnosis not present

## 2022-03-15 DIAGNOSIS — Z299 Encounter for prophylactic measures, unspecified: Secondary | ICD-10-CM | POA: Diagnosis not present

## 2022-03-15 DIAGNOSIS — Z713 Dietary counseling and surveillance: Secondary | ICD-10-CM | POA: Diagnosis not present

## 2022-03-15 DIAGNOSIS — I10 Essential (primary) hypertension: Secondary | ICD-10-CM | POA: Diagnosis not present

## 2022-03-22 NOTE — Progress Notes (Signed)
Remote pacemaker transmission.   

## 2022-04-06 DIAGNOSIS — Z6823 Body mass index (BMI) 23.0-23.9, adult: Secondary | ICD-10-CM | POA: Diagnosis not present

## 2022-04-06 DIAGNOSIS — Z299 Encounter for prophylactic measures, unspecified: Secondary | ICD-10-CM | POA: Diagnosis not present

## 2022-04-06 DIAGNOSIS — I10 Essential (primary) hypertension: Secondary | ICD-10-CM | POA: Diagnosis not present

## 2022-04-06 DIAGNOSIS — Z0000 Encounter for general adult medical examination without abnormal findings: Secondary | ICD-10-CM | POA: Diagnosis not present

## 2022-04-06 DIAGNOSIS — N183 Chronic kidney disease, stage 3 unspecified: Secondary | ICD-10-CM | POA: Diagnosis not present

## 2022-04-06 DIAGNOSIS — I700 Atherosclerosis of aorta: Secondary | ICD-10-CM | POA: Diagnosis not present

## 2022-04-12 ENCOUNTER — Other Ambulatory Visit: Payer: Self-pay | Admitting: Hematology and Oncology

## 2022-04-14 NOTE — Progress Notes (Signed)
 Patient Care Team: Shah, Ashish, MD as PCP - General (Internal Medicine) Taylor, Gregg W, MD as Consulting Physician (Cardiology) Wakefield, Matthew, MD as Consulting Physician (General Surgery) Gudena, Vinay, MD as Consulting Physician (Hematology and Oncology) Moody, John, MD as Consulting Physician (Radiation Oncology) Causey, Lindsey Cornetto, NP as Nurse Practitioner (Hematology and Oncology)  DIAGNOSIS:  Encounter Diagnosis  Name Primary?   Malignant neoplasm of upper-outer quadrant of right breast in female, estrogen receptor positive (HCC)     SUMMARY OF ONCOLOGIC HISTORY: Oncology History  Malignant neoplasm of upper-outer quadrant of right breast in female, estrogen receptor positive (HCC)  06/11/2018 Initial Diagnosis   Screening detected right breast asymmetry posteriorly measuring 0.3 cm at 12 o'clock position.  Biopsy revealed grade 1-2 invasive ductal carcinoma ER 100%, PR 80%, Ki-67 2%, HER-2 negative, T1 a N0 stage Ia   07/13/2018 Surgery   Right lumpectomy: IDC grade 2, 0.7 cm, cancer does not involve margins, negative for lymphovascular or perineural invasion, lymph nodes not sampled, ER 100%, PR 80%, HER-2 negative, Ki-67 2%, T1B NX stage Ia   07/25/2018 Cancer Staging   Staging form: Breast, AJCC 8th Edition - Pathologic: Stage IA (pT1b, pN0, cM0, G2, ER+, PR+, HER2-) - Signed by Causey, Lindsey Cornetto, NP on 07/25/2018   07/2018 -  Anti-estrogen oral therapy   Letrozole daily     CHIEF COMPLIANT: Follow-up of left breast cancer on letrozole    INTERVAL HISTORY: Jessica Myers is a 88 y.o. with above-mentioned history of left breast cancer. She presents to the clinic today for a follow-up. She states that she has some fatigue doesn't have the energy she use to have.  She denies any lumps or nodules in the breast.   ALLERGIES:  is allergic to biaxin [clarithromycin], penicillins, shellfish allergy, sulfa antibiotics, and celebrex  [celecoxib].  MEDICATIONS:  Current Outpatient Medications  Medication Sig Dispense Refill   acetaminophen (TYLENOL) 500 MG tablet Take 500-1,000 mg by mouth every 6 (six) hours as needed for mild pain.     atorvastatin (LIPITOR) 10 MG tablet Take 10 mg by mouth every evening.      calcium carbonate (OSCAL) 1500 (600 Ca) MG TABS tablet Take by mouth 2 (two) times daily with a meal.     carbamide peroxide (DEBROX) 6.5 % OTIC solution Place 5-10 drops into both ears 2 (two) times daily as needed (wax build up).     cholecalciferol (VITAMIN D) 1000 UNITS tablet Take 1,000 Units by mouth daily.      ELIQUIS 5 MG TABS tablet TAKE 1 TABLET BY MOUTH TWICE A DAY 180 tablet 2   flecainide (TAMBOCOR) 150 MG tablet TAKE 1/2 TABLETS (75 MG TOTAL) BY MOUTH 2 (TWO) TIMES DAILY. 90 tablet 3   levothyroxine (SYNTHROID, LEVOTHROID) 112 MCG tablet Take 112 mcg by mouth daily before breakfast.  2   lisinopril (ZESTRIL) 20 MG tablet Take 1 tablet (20 mg total) by mouth 2 (two) times daily.     meclizine (ANTIVERT) 25 MG tablet Take 25 mg by mouth 3 (three) times daily as needed for dizziness.     metoprolol succinate (TOPROL XL) 25 MG 24 hr tablet Take 2-3 Times Daily as Directed. 90 tablet 11   omeprazole (PRILOSEC) 20 MG capsule Take 20 mg by mouth at bedtime.      Polyethyl Glycol-Propyl Glycol 0.4-0.3 % SOLN Place 1 drop into both eyes 3 (three) times daily as needed (for dry eyes.).     polyethylene glycol (MIRALAX /   GLYCOLAX) packet Take 17 g by mouth daily as needed (for constipation.).      simethicone (MYLICON) 253 MG chewable tablet Chew 125-250 mg by mouth every 6 (six) hours as needed for flatulence.     No current facility-administered medications for this visit.    PHYSICAL EXAMINATION: ECOG PERFORMANCE STATUS: 1 - Symptomatic but completely ambulatory  Vitals:   04/28/22 1133  BP: 129/79  Pulse: 86  Resp: 18  Temp: (!) 97.5 F (36.4 C)  SpO2: 98%   Filed Weights   04/28/22 1133   Weight: 129 lb 11.2 oz (58.8 kg)    BREAST: No palpable masses or nodules in either right or left breasts. No palpable axillary supraclavicular or infraclavicular adenopathy no breast tenderness or nipple discharge. (exam performed in the presence of a chaperone)  LABORATORY DATA:  I have reviewed the data as listed    Latest Ref Rng & Units 07/06/2018    3:00 PM 06/20/2018   12:12 PM 12/13/2017    2:07 PM  CMP  Glucose 70 - 99 mg/dL 98  83  85   BUN 8 - 23 mg/dL _0 Creatinine 0.44 - 1.00 mg/dL 1.48  1.34  1.24   Sodium 135 - 145 mmol/L 139  141  139   Potassium 3.5 - 5.1 mmol/L 4.7  4.9  4.3   Chloride 98 - 111 mmol/L 105  106  103   CO2 22 - 32 mmol/L _1 Calcium 8.9 - 10.3 mg/dL 9.3  10.1  9.7   Total Protein 6.5 - 8.1 g/dL  7.3    Total Bilirubin 0.3 - 1.2 mg/dL  0.8    Alkaline Phos 38 - 126 U/L  83    AST 15 - 41 U/L  17    ALT 0 - 44 U/L  11      Lab Results  Component Value Date   WBC 5.9 07/06/2018   HGB 12.3 07/06/2018   HCT 38.7 07/06/2018   MCV 98.7 07/06/2018   PLT 170 07/06/2018   NEUTROABS 3.0 06/20/2018    ASSESSMENT & PLAN:  Malignant neoplasm of upper-outer quadrant of right breast in female, estrogen receptor positive ([place]) 07/13/2018: Right lumpectomy: IDC grade 2, 0.7 cm, cancer does not involve margins, negative for lymphovascular or perineural invasion, lymph nodes not sampled, ER 100%, PR 80%, HER-2 negative, Ki-67 2%, T1B NX stage Ia   Current treatment: Adjuvant antiestrogen therapy with letrozole 2.5 mg daily started Oct 2019 completed July 2023  Letrozole Toxicities: Fatigue Given her age the fact that she took 4 years of antiestrogen therapy recommended that she can discontinue letrozole at this time.   Breast cancer Surveillance: 1. Mammogram: 07/06/2020: [facility] benign breast density category B 2. Dexa Scan 09/11/18: T score -1.8   3.  Breast exam 04/28/2022: Benign   RTC on an as-needed basis.    No orders of the  defined types were placed in this encounter.  The patient has a good understanding of the overall plan. she agrees with it. she will call with any problems that may develop before the next visit here. Total time spent: 30 mins including face to face time and time spent for planning, charting and co-ordination of care   Harriette Ohara, MD 04/28/22    I Gardiner Coins am scribing for Dr. Lindi Adie  I have reviewed the above documentation for accuracy and completeness, and I agree with the above.

## 2022-04-27 NOTE — Assessment & Plan Note (Signed)
07/13/2018:Right lumpectomy: IDC grade 2, 0.7 cm, cancer does not involve margins, negative for lymphovascular or perineural invasion, lymph nodes not sampled, ER 100%, PR 80%, HER-2 negative, Ki-67 2%, T1B NX stage Ia  Current treatment: Adjuvant antiestrogen therapy with letrozole 2.5 mg dailystarted Oct 2019 completed July 2023  Letrozole Toxicities:Fatigue Given her age the fact that she took 4 years of antiestrogen therapy recommended that she can discontinue letrozole at this time.  Breast cancer Surveillance: 1. Mammogram: 07/06/2020: [facility] benign breast density category B 2. Dexa Scan 09/11/18: T score -1.8  3.  Breast exam 04/28/2022: Benign  RTC on an as-needed basis.

## 2022-04-28 ENCOUNTER — Other Ambulatory Visit: Payer: Self-pay

## 2022-04-28 ENCOUNTER — Inpatient Hospital Stay: Payer: PPO | Attending: Hematology and Oncology | Admitting: Hematology and Oncology

## 2022-04-28 DIAGNOSIS — C50411 Malignant neoplasm of upper-outer quadrant of right female breast: Secondary | ICD-10-CM | POA: Insufficient documentation

## 2022-04-28 DIAGNOSIS — Z79899 Other long term (current) drug therapy: Secondary | ICD-10-CM | POA: Diagnosis not present

## 2022-04-28 DIAGNOSIS — Z170 Estrogen receptor positive status [ER+]: Secondary | ICD-10-CM | POA: Diagnosis not present

## 2022-04-28 DIAGNOSIS — Z7901 Long term (current) use of anticoagulants: Secondary | ICD-10-CM | POA: Insufficient documentation

## 2022-04-28 DIAGNOSIS — Z79890 Hormone replacement therapy (postmenopausal): Secondary | ICD-10-CM | POA: Diagnosis not present

## 2022-04-28 DIAGNOSIS — R5383 Other fatigue: Secondary | ICD-10-CM | POA: Insufficient documentation

## 2022-04-28 DIAGNOSIS — Z79811 Long term (current) use of aromatase inhibitors: Secondary | ICD-10-CM | POA: Diagnosis not present

## 2022-04-28 MED ORDER — LISINOPRIL 20 MG PO TABS: 20.0000 mg | ORAL_TABLET | Freq: Two times a day (BID) | ORAL | Status: DC

## 2022-04-29 ENCOUNTER — Telehealth: Payer: Self-pay | Admitting: Hematology and Oncology

## 2022-04-29 NOTE — Telephone Encounter (Signed)
Scheduled appointment per 7/14 los. Called patient but got a message stating that the call can not go through at this time. Patient will be mailed an updated calendar.

## 2022-05-18 DIAGNOSIS — I10 Essential (primary) hypertension: Secondary | ICD-10-CM | POA: Diagnosis not present

## 2022-05-18 DIAGNOSIS — Z299 Encounter for prophylactic measures, unspecified: Secondary | ICD-10-CM | POA: Diagnosis not present

## 2022-05-18 DIAGNOSIS — I4891 Unspecified atrial fibrillation: Secondary | ICD-10-CM | POA: Diagnosis not present

## 2022-05-18 DIAGNOSIS — E7800 Pure hypercholesterolemia, unspecified: Secondary | ICD-10-CM | POA: Diagnosis not present

## 2022-05-18 DIAGNOSIS — Z6823 Body mass index (BMI) 23.0-23.9, adult: Secondary | ICD-10-CM | POA: Diagnosis not present

## 2022-05-18 DIAGNOSIS — R079 Chest pain, unspecified: Secondary | ICD-10-CM | POA: Diagnosis not present

## 2022-06-08 ENCOUNTER — Ambulatory Visit (INDEPENDENT_AMBULATORY_CARE_PROVIDER_SITE_OTHER): Payer: PPO

## 2022-06-08 DIAGNOSIS — I495 Sick sinus syndrome: Secondary | ICD-10-CM | POA: Diagnosis not present

## 2022-06-09 LAB — CUP PACEART REMOTE DEVICE CHECK
Battery Impedance: 1276 Ohm
Battery Remaining Longevity: 48 mo
Battery Voltage: 2.75 V
Brady Statistic AP VP Percent: 99 %
Brady Statistic AP VS Percent: 0 %
Brady Statistic AS VP Percent: 1 %
Brady Statistic AS VS Percent: 0 %
Date Time Interrogation Session: 20230823114316
Implantable Lead Implant Date: 20141009
Implantable Lead Implant Date: 20141009
Implantable Lead Location: 753859
Implantable Lead Location: 753860
Implantable Lead Model: 1944
Implantable Lead Model: 1948
Implantable Pulse Generator Implant Date: 20141009
Lead Channel Impedance Value: 529 Ohm
Lead Channel Impedance Value: 820 Ohm
Lead Channel Pacing Threshold Amplitude: 0.625 V
Lead Channel Pacing Threshold Amplitude: 0.875 V
Lead Channel Pacing Threshold Pulse Width: 0.4 ms
Lead Channel Pacing Threshold Pulse Width: 0.4 ms
Lead Channel Setting Pacing Amplitude: 2 V
Lead Channel Setting Pacing Amplitude: 2.5 V
Lead Channel Setting Pacing Pulse Width: 0.4 ms
Lead Channel Setting Sensing Sensitivity: 5.6 mV

## 2022-06-23 DIAGNOSIS — I10 Essential (primary) hypertension: Secondary | ICD-10-CM | POA: Diagnosis not present

## 2022-06-23 DIAGNOSIS — H6123 Impacted cerumen, bilateral: Secondary | ICD-10-CM | POA: Diagnosis not present

## 2022-06-23 DIAGNOSIS — I4891 Unspecified atrial fibrillation: Secondary | ICD-10-CM | POA: Diagnosis not present

## 2022-06-23 DIAGNOSIS — Z299 Encounter for prophylactic measures, unspecified: Secondary | ICD-10-CM | POA: Diagnosis not present

## 2022-07-05 NOTE — Progress Notes (Signed)
Remote pacemaker transmission.   

## 2022-07-18 DIAGNOSIS — R922 Inconclusive mammogram: Secondary | ICD-10-CM | POA: Diagnosis not present

## 2022-07-18 DIAGNOSIS — Z803 Family history of malignant neoplasm of breast: Secondary | ICD-10-CM | POA: Diagnosis not present

## 2022-07-18 DIAGNOSIS — Z853 Personal history of malignant neoplasm of breast: Secondary | ICD-10-CM | POA: Diagnosis not present

## 2022-07-20 ENCOUNTER — Encounter: Payer: Self-pay | Admitting: Hematology and Oncology

## 2022-08-05 ENCOUNTER — Other Ambulatory Visit: Payer: Self-pay | Admitting: Internal Medicine

## 2022-08-05 DIAGNOSIS — I4891 Unspecified atrial fibrillation: Secondary | ICD-10-CM

## 2022-08-09 ENCOUNTER — Other Ambulatory Visit: Payer: Self-pay

## 2022-08-09 ENCOUNTER — Emergency Department (HOSPITAL_COMMUNITY): Payer: PPO

## 2022-08-09 ENCOUNTER — Encounter (HOSPITAL_COMMUNITY): Payer: Self-pay | Admitting: Emergency Medicine

## 2022-08-09 ENCOUNTER — Inpatient Hospital Stay (HOSPITAL_COMMUNITY)
Admission: EM | Admit: 2022-08-09 | Discharge: 2022-08-15 | DRG: 482 | Disposition: A | Discharge status: Skilled Nursing Facility | Payer: PPO | Attending: Internal Medicine | Admitting: Internal Medicine

## 2022-08-09 DIAGNOSIS — I639 Cerebral infarction, unspecified: Secondary | ICD-10-CM | POA: Diagnosis not present

## 2022-08-09 DIAGNOSIS — R262 Difficulty in walking, not elsewhere classified: Secondary | ICD-10-CM | POA: Diagnosis not present

## 2022-08-09 DIAGNOSIS — E559 Vitamin D deficiency, unspecified: Secondary | ICD-10-CM | POA: Diagnosis not present

## 2022-08-09 DIAGNOSIS — T148XXA Other injury of unspecified body region, initial encounter: Secondary | ICD-10-CM | POA: Diagnosis not present

## 2022-08-09 DIAGNOSIS — W19XXXA Unspecified fall, initial encounter: Secondary | ICD-10-CM | POA: Diagnosis not present

## 2022-08-09 DIAGNOSIS — Z91013 Allergy to seafood: Secondary | ICD-10-CM

## 2022-08-09 DIAGNOSIS — M7989 Other specified soft tissue disorders: Secondary | ICD-10-CM

## 2022-08-09 DIAGNOSIS — Z741 Need for assistance with personal care: Secondary | ICD-10-CM | POA: Diagnosis not present

## 2022-08-09 DIAGNOSIS — S72011A Unspecified intracapsular fracture of right femur, initial encounter for closed fracture: Secondary | ICD-10-CM | POA: Diagnosis not present

## 2022-08-09 DIAGNOSIS — I4819 Other persistent atrial fibrillation: Secondary | ICD-10-CM | POA: Diagnosis not present

## 2022-08-09 DIAGNOSIS — K219 Gastro-esophageal reflux disease without esophagitis: Secondary | ICD-10-CM | POA: Diagnosis not present

## 2022-08-09 DIAGNOSIS — Y92009 Unspecified place in unspecified non-institutional (private) residence as the place of occurrence of the external cause: Secondary | ICD-10-CM | POA: Diagnosis not present

## 2022-08-09 DIAGNOSIS — E785 Hyperlipidemia, unspecified: Secondary | ICD-10-CM | POA: Diagnosis not present

## 2022-08-09 DIAGNOSIS — Z01818 Encounter for other preprocedural examination: Secondary | ICD-10-CM | POA: Diagnosis not present

## 2022-08-09 DIAGNOSIS — I4891 Unspecified atrial fibrillation: Secondary | ICD-10-CM | POA: Diagnosis not present

## 2022-08-09 DIAGNOSIS — E039 Hypothyroidism, unspecified: Secondary | ICD-10-CM | POA: Diagnosis not present

## 2022-08-09 DIAGNOSIS — Z950 Presence of cardiac pacemaker: Secondary | ICD-10-CM | POA: Diagnosis not present

## 2022-08-09 DIAGNOSIS — Z8673 Personal history of transient ischemic attack (TIA), and cerebral infarction without residual deficits: Secondary | ICD-10-CM | POA: Diagnosis not present

## 2022-08-09 DIAGNOSIS — Z79890 Hormone replacement therapy (postmenopausal): Secondary | ICD-10-CM

## 2022-08-09 DIAGNOSIS — Z853 Personal history of malignant neoplasm of breast: Secondary | ICD-10-CM

## 2022-08-09 DIAGNOSIS — W010XXA Fall on same level from slipping, tripping and stumbling without subsequent striking against object, initial encounter: Secondary | ICD-10-CM | POA: Diagnosis present

## 2022-08-09 DIAGNOSIS — Z882 Allergy status to sulfonamides status: Secondary | ICD-10-CM

## 2022-08-09 DIAGNOSIS — S72001A Fracture of unspecified part of neck of right femur, initial encounter for closed fracture: Secondary | ICD-10-CM | POA: Diagnosis not present

## 2022-08-09 DIAGNOSIS — M47816 Spondylosis without myelopathy or radiculopathy, lumbar region: Secondary | ICD-10-CM | POA: Diagnosis not present

## 2022-08-09 DIAGNOSIS — R52 Pain, unspecified: Secondary | ICD-10-CM | POA: Diagnosis not present

## 2022-08-09 DIAGNOSIS — Z881 Allergy status to other antibiotic agents status: Secondary | ICD-10-CM

## 2022-08-09 DIAGNOSIS — Z7901 Long term (current) use of anticoagulants: Secondary | ICD-10-CM

## 2022-08-09 DIAGNOSIS — Z886 Allergy status to analgesic agent status: Secondary | ICD-10-CM | POA: Diagnosis not present

## 2022-08-09 DIAGNOSIS — Z9181 History of falling: Secondary | ICD-10-CM | POA: Diagnosis not present

## 2022-08-09 DIAGNOSIS — M160 Bilateral primary osteoarthritis of hip: Secondary | ICD-10-CM | POA: Diagnosis not present

## 2022-08-09 DIAGNOSIS — I495 Sick sinus syndrome: Secondary | ICD-10-CM | POA: Diagnosis not present

## 2022-08-09 DIAGNOSIS — Z880 Allergy status to penicillin: Secondary | ICD-10-CM | POA: Diagnosis not present

## 2022-08-09 DIAGNOSIS — R488 Other symbolic dysfunctions: Secondary | ICD-10-CM | POA: Diagnosis not present

## 2022-08-09 DIAGNOSIS — F411 Generalized anxiety disorder: Secondary | ICD-10-CM | POA: Diagnosis not present

## 2022-08-09 DIAGNOSIS — E782 Mixed hyperlipidemia: Secondary | ICD-10-CM | POA: Diagnosis not present

## 2022-08-09 DIAGNOSIS — R279 Unspecified lack of coordination: Secondary | ICD-10-CM | POA: Diagnosis not present

## 2022-08-09 DIAGNOSIS — Z79899 Other long term (current) drug therapy: Secondary | ICD-10-CM

## 2022-08-09 DIAGNOSIS — F419 Anxiety disorder, unspecified: Secondary | ICD-10-CM | POA: Diagnosis not present

## 2022-08-09 DIAGNOSIS — Z1152 Encounter for screening for COVID-19: Secondary | ICD-10-CM

## 2022-08-09 DIAGNOSIS — M6281 Muscle weakness (generalized): Secondary | ICD-10-CM | POA: Diagnosis not present

## 2022-08-09 DIAGNOSIS — M25551 Pain in right hip: Secondary | ICD-10-CM | POA: Diagnosis not present

## 2022-08-09 DIAGNOSIS — S72002A Fracture of unspecified part of neck of left femur, initial encounter for closed fracture: Secondary | ICD-10-CM | POA: Diagnosis not present

## 2022-08-09 DIAGNOSIS — I10 Essential (primary) hypertension: Secondary | ICD-10-CM | POA: Diagnosis not present

## 2022-08-09 DIAGNOSIS — Z803 Family history of malignant neoplasm of breast: Secondary | ICD-10-CM

## 2022-08-09 DIAGNOSIS — I480 Paroxysmal atrial fibrillation: Secondary | ICD-10-CM | POA: Diagnosis present

## 2022-08-09 DIAGNOSIS — D696 Thrombocytopenia, unspecified: Secondary | ICD-10-CM

## 2022-08-09 DIAGNOSIS — I482 Chronic atrial fibrillation, unspecified: Secondary | ICD-10-CM | POA: Diagnosis not present

## 2022-08-09 DIAGNOSIS — Z9889 Other specified postprocedural states: Secondary | ICD-10-CM | POA: Diagnosis not present

## 2022-08-09 DIAGNOSIS — S72001D Fracture of unspecified part of neck of right femur, subsequent encounter for closed fracture with routine healing: Secondary | ICD-10-CM | POA: Diagnosis not present

## 2022-08-09 DIAGNOSIS — R600 Localized edema: Secondary | ICD-10-CM | POA: Diagnosis not present

## 2022-08-09 LAB — CBC
HCT: 36.9 % (ref 36.0–46.0)
Hemoglobin: 12.2 g/dL (ref 12.0–15.0)
MCH: 31.3 pg (ref 26.0–34.0)
MCHC: 33.1 g/dL (ref 30.0–36.0)
MCV: 94.6 fL (ref 80.0–100.0)
Platelets: 135 10*3/uL — ABNORMAL LOW (ref 150–400)
RBC: 3.9 MIL/uL (ref 3.87–5.11)
RDW: 12.1 % (ref 11.5–15.5)
WBC: 9.9 10*3/uL (ref 4.0–10.5)
nRBC: 0 % (ref 0.0–0.2)

## 2022-08-09 LAB — BASIC METABOLIC PANEL
Anion gap: 9 (ref 5–15)
BUN: 24 mg/dL — ABNORMAL HIGH (ref 8–23)
CO2: 23 mmol/L (ref 22–32)
Calcium: 9.3 mg/dL (ref 8.9–10.3)
Chloride: 105 mmol/L (ref 98–111)
Creatinine, Ser: 1.11 mg/dL — ABNORMAL HIGH (ref 0.44–1.00)
GFR, Estimated: 47 mL/min — ABNORMAL LOW (ref >60)
Glucose, Bld: 104 mg/dL — ABNORMAL HIGH (ref 70–99)
Potassium: 4.2 mmol/L (ref 3.5–5.1)
Sodium: 137 mmol/L (ref 135–145)

## 2022-08-09 MED ORDER — ACETAMINOPHEN 500 MG PO TABS
1000.0000 mg | ORAL_TABLET | Freq: Once | ORAL | Status: AC
  Administered 2022-08-09: 1000 mg via ORAL
  Filled 2022-08-09: qty 2

## 2022-08-09 NOTE — ED Notes (Signed)
Pt refusing IV until she gets a bed upstairs.

## 2022-08-09 NOTE — ED Provider Notes (Signed)
[facility] EMERGENCY DEPARTMENT Provider Note   CSN: [id number] Arrival date & time: 08/09/22  1834     History {Add pertinent medical, surgical, social history, OB history to HPI:1} Chief Complaint  Patient presents with   Jessica Myers is a 88 y.o. female with a history of a pacemaker, presented to ED with a fall on her rug today, landing on her right hip.  Patient is on Eliquis but adamantly denies striking her head.  She says she was able to get up and limp on her leg at home, but throughout the day the pain got worse and now she is not able to put weight on the right leg.  She typically lives independently with her husband in their home.  She does not typically walk with a walker or a cane.  HPI     Home Medications Prior to Admission medications   Medication Sig Start Date End Date Taking? Authorizing Provider  acetaminophen (TYLENOL) 500 MG tablet Take 500-1,000 mg by mouth every 6 (six) hours as needed for mild pain.   Yes [provider]  atorvastatin (LIPITOR) 10 MG tablet Take 10 mg by mouth every evening.    Yes [provider]  calcium carbonate (OSCAL) 1500 (600 Ca) MG TABS tablet Take by mouth 2 (two) times daily with a meal.   Yes [provider]  cholecalciferol (VITAMIN D) 1000 UNITS tablet Take 1,000 Units by mouth daily.    Yes [provider]  ELIQUIS 5 MG TABS tablet TAKE 1 TABLET BY MOUTH TWICE A DAY 11/13/19  Yes Evans Lance, MD  flecainide (TAMBOCOR) 150 MG tablet TAKE HALF A TABLET (75 MG TOTAL) BY MOUTH 2 (TWO) TIMES DAILY. 08/05/22  Yes Evans Lance, MD  KRILL OIL PO Take by mouth.   Yes [provider]  levothyroxine (SYNTHROID, LEVOTHROID) 112 MCG tablet Take 112 mcg by mouth daily before breakfast. 06/18/18  Yes [provider]  lisinopril (ZESTRIL) 20 MG tablet Take 1 tablet (20 mg total) by mouth 2 (two) times daily. 04/28/22  Yes Nicholas Lose, MD  meclizine (ANTIVERT) 25 MG tablet  Take 25 mg by mouth 3 (three) times daily as needed for dizziness.   Yes [provider]  metoprolol succinate (TOPROL XL) 25 MG 24 hr tablet Take 2-3 Times Daily as Directed. 01/04/22  Yes Evans Lance, MD  Multiple Vitamins-Minerals (PRESERVISION AREDS PO) Take 1 tablet by mouth daily.   Yes [provider]  omeprazole (PRILOSEC) 20 MG capsule Take 20 mg by mouth at bedtime.    Yes [provider]  Polyethyl Glycol-Propyl Glycol 0.4-0.3 % SOLN Place 1 drop into both eyes 3 (three) times daily as needed (for dry eyes.).   Yes [provider]  polyethylene glycol (MIRALAX / GLYCOLAX) packet Take 17 g by mouth daily as needed (for constipation.).    Yes [provider]  simethicone (MYLICON) 948 MG chewable tablet Chew 125-250 mg by mouth every 6 (six) hours as needed for flatulence.   Yes [provider]      Allergies    Biaxin [clarithromycin], Penicillins, Shellfish allergy, Sulfa antibiotics, and Celebrex [celecoxib]    Review of Systems   Review of Systems  Physical Exam Updated Vital Signs BP (!) 125/54 (BP Location: Right Arm)   Pulse 62   Temp 98.7 F (37.1 C) (Oral)   Resp 17   SpO2 93%  Physical Exam Constitutional:  General: She is not in acute distress. HENT:     Head: Normocephalic and atraumatic.  Eyes:     Conjunctiva/sclera: Conjunctivae normal.     Pupils: Pupils are equal, round, and reactive to light.  Cardiovascular:     Rate and Rhythm: Normal rate and regular rhythm.  Pulmonary:     Effort: Pulmonary effort is normal. No respiratory distress.  Abdominal:     General: There is no distension.     Tenderness: There is no abdominal tenderness.  Skin:    General: Skin is warm and dry.  Neurological:     General: No focal deficit present.     Mental Status: She is alert. Mental status is at baseline.  Psychiatric:        Mood and Affect: Mood normal.        Behavior: Behavior normal.     ED  Results / Procedures / Treatments   Labs (all labs ordered are listed, but only abnormal results are displayed) Labs Reviewed - No data to display  EKG None  Radiology DG Hip Unilat  With Pelvis 2-3 Views Right  Result Date: 08/09/2022 CLINICAL DATA:  Fall at home today with right hip pain. EXAM: DG HIP (WITH OR WITHOUT PELVIS) 2-3V RIGHT COMPARISON:  None Available. FINDINGS: There is a cortical irregularity in the subcapital region of the proximal right femur suggesting impaction fracture. No dislocation. Cortical irregularity is also noted at the sacral ala on the right, possible fracture. Mild degenerative changes are noted at the hips bilaterally in the lower lumbar spine. IMPRESSION: 1. Findings suggestive of subcapital femoral fracture on the right. 2. Cortical irregularity in the sacral ala on the right, possible fracture. CT pelvis is recommended for further evaluation. Electronically Signed   By: Brett Fairy M.D.   On: 08/09/2022 20:03    Procedures Procedures  {Document cardiac monitor, telemetry assessment procedure when appropriate:1}  Medications Ordered in ED Medications  acetaminophen (TYLENOL) tablet 1,000 mg (has no administration in time range)    ED Course/ Medical Decision Making/ A&P                           Medical Decision Making Amount and/or Complexity of Data Reviewed Radiology: ordered.  Risk OTC drugs.   Patient is here with a mechanical fall onto her right hip, now with right hip pain.  Differential would include fracture versus sprain versus other.  Patient had x-rays of the hip performed after arrival which showed questionable subcapital fracture and potential sacral alla fracture.  She is neurovascularly intact on exam.  I doubt ischemic limb.  I do not see any other traumatic injuries on exam.  I do not see indication for further imaging including neuroimaging of the head, she did not strike her head.  CT of the hip was subsequently ordered for  better with visualization of fracture, personally reviewed and interpreted, and agree with radiologist, noting ***  Family was present for supplemental history  This condition carries high risk morbidity mortality given the patient's age.  Orthopedics was consulted by phone, recommending ***  {Document critical care time when appropriate:1} {Document review of labs and clinical decision tools ie heart score, Chads2Vasc2 etc:1}  {Document your independent review of radiology images, and any outside records:1} {Document your discussion with family members, caretakers, and with consultants:1} {Document social determinants of health affecting pt's care:1} {Document your decision making why or why not admission, treatments were needed:1} Final  Clinical Impression(s) / ED Diagnoses Final diagnoses:  None    Rx / DC Orders ED Discharge Orders     None

## 2022-08-09 NOTE — H&P (Incomplete)
History and Physical    Patient: Jessica Myers FTD:[id number] DOB: 06-Sep-1929 DOA: 08/09/2022 DOS: the patient was seen and examined on 08/09/2022 PCP: Monico Blitz, MD  Patient coming from: Home  Chief Complaint:  Chief Complaint  Patient presents with  . Fall   HPI: [name] is a 88 y.o. female with medical history significant of hyperlipidemia, hypothyroidism, SSS s/p pacemaker placement, GERD, A-fib on Eliquis who presents emergency department via EMS from home due to sustaining a fall at home.  Patient tripped on a rug and fell landing on right hip, though she was able to get up, she kept limping and the pain worsened throughout the day to the extent that patient cannot bear weight on the right leg anymore.  EMS was activated and patient was sent to the ED for further evaluation and management.  Patient lives at home with husband.  ED Course:  In the emergency department, he was hemodynamically stable, BP was 144/101, but other vital signs were within normal range.  Work-up in the ED showed normal CBC except for thrombocytopenia, BMP was normal except for blood glucose of 104 and BUN/creatinine of 10/1.11 (creatinine is better than baseline of 1.2-1.4). CT pelvis without contrast showed acute impacted fracture of the right femoral neck.  Degenerative changes in the lower lumbar spine and hips. X-ray of the right hip showed findings suggestive of subcapital femoral fracture on the right.   Review of Systems: Review of systems as noted in the HPI. All other systems reviewed and are negative.   Past Medical History:  Diagnosis Date  . A-fib ([place])   . Anxiety   . Bradycardia    s/p PPM October 2014  . Chronic anticoagulation   . Dysrhythmia   . Headache(784.0)   . High cholesterol   . Hypertension   . Malignant neoplasm of upper-outer quadrant of right breast in female, estrogen receptor positive ([place]) 06/15/2018  . Multiple rib fractures 07/01/2016   fell down stairs    . Pulmonary contusion 06/2016   from a fall   . Stroke [facility]) September 2014   thrombolytic therapy  . Thyroid disease   . Vertical vertigo    Past Surgical History:  Procedure Laterality Date  . BREAST LUMPECTOMY WITH RADIOACTIVE SEED LOCALIZATION Right 07/13/2018   Procedure: BREAST LUMPECTOMY WITH RADIOACTIVE SEED LOCALIZATION;  Surgeon: Rolm Bookbinder, MD;  Location: [place];  Service: General;  Laterality: Right;  . CHOLECYSTECTOMY    . INSERT / REPLACE / REMOVE PACEMAKER    . PACEMAKER INSERTION    . PERMANENT PACEMAKER INSERTION N/A 07/25/2013   Procedure: PERMANENT PACEMAKER INSERTION;  Surgeon: Deboraha Sprang, MD;  Location: [facility] CATH LAB;  Service: Cardiovascular;  Laterality: N/A;  . TEE WITHOUT CARDIOVERSION N/A 07/16/2013   Procedure: TRANSESOPHAGEAL ECHOCARDIOGRAM (TEE);  Surgeon: Thayer Headings, MD;  Location: [place];  Service: Cardiovascular;  Laterality: N/A;    Social History:  reports that she has never smoked. She has never used smokeless tobacco. She reports that she does not drink alcohol and does not use drugs.   Allergies  Allergen Reactions  . Biaxin [Clarithromycin] Shortness Of Breath and Rash    Unknown  . Penicillins Hives    Has patient had a PCN reaction causing immediate rash, facial/tongue/throat swelling, SOB or lightheadedness with hypotension: Yes Has patient had a PCN reaction causing severe rash involving mucus membranes or skin necrosis: No Has patient had a PCN reaction that required hospitalization No Has patient had  a PCN reaction occurring within the last 10 years: No If all of the above answers are "NO", then may proceed with Cephalosporin use.    . Shellfish Allergy Nausea And Vomiting  . Sulfa Antibiotics Other (See Comments)    "bad reaction," per patient  . Celebrex [Celecoxib] Rash    Family History  Problem Relation Age of Onset  . Cancer Mother   . Breast cancer Mother   . Cancer Father   . Lung cancer Father      ***  Prior to Admission medications   Medication Sig Start Date End Date Taking? Authorizing Provider  acetaminophen (TYLENOL) 500 MG tablet Take 500-1,000 mg by mouth every 6 (six) hours as needed for mild pain.   Yes [provider]  atorvastatin (LIPITOR) 10 MG tablet Take 10 mg by mouth every evening.    Yes [provider]  calcium carbonate (OSCAL) 1500 (600 Ca) MG TABS tablet Take by mouth 2 (two) times daily with a meal.   Yes [provider]  cholecalciferol (VITAMIN D) 1000 UNITS tablet Take 1,000 Units by mouth daily.    Yes [provider]  ELIQUIS 5 MG TABS tablet TAKE 1 TABLET BY MOUTH TWICE A DAY 11/13/19  Yes Evans Lance, MD  flecainide (TAMBOCOR) 150 MG tablet TAKE HALF A TABLET (75 MG TOTAL) BY MOUTH 2 (TWO) TIMES DAILY. 08/05/22  Yes Evans Lance, MD  KRILL OIL PO Take by mouth.   Yes [provider]  levothyroxine (SYNTHROID, LEVOTHROID) 112 MCG tablet Take 112 mcg by mouth daily before breakfast. 06/18/18  Yes [provider]  lisinopril (ZESTRIL) 20 MG tablet Take 1 tablet (20 mg total) by mouth 2 (two) times daily. 04/28/22  Yes Nicholas Lose, MD  meclizine (ANTIVERT) 25 MG tablet Take 25 mg by mouth 3 (three) times daily as needed for dizziness.   Yes [provider]  metoprolol succinate (TOPROL XL) 25 MG 24 hr tablet Take 2-3 Times Daily as Directed. 01/04/22  Yes Evans Lance, MD  Multiple Vitamins-Minerals (PRESERVISION AREDS PO) Take 1 tablet by mouth daily.   Yes [provider]  omeprazole (PRILOSEC) 20 MG capsule Take 20 mg by mouth at bedtime.    Yes [provider]  Polyethyl Glycol-Propyl Glycol 0.4-0.3 % SOLN Place 1 drop into both eyes 3 (three) times daily as needed (for dry eyes.).   Yes [provider]  polyethylene glycol (MIRALAX / GLYCOLAX) packet Take 17 g by mouth daily as needed (for constipation.).    Yes [provider]  simethicone (MYLICON) 831  MG chewable tablet Chew 125-250 mg by mouth every 6 (six) hours as needed for flatulence.   Yes [provider]    Physical Exam: BP (!) 114/47   Pulse 60   Temp 98.7 F (37.1 C) (Oral)   Resp 20   SpO2 95%   General: 88 y.o. year-old female well developed well nourished in no acute distress.  Alert and oriented x3. HEENT: NCAT, EOMI Neck: Supple, trachea medial Cardiovascular: Regular rate and rhythm with no rubs or gallops.  No thyromegaly or JVD noted.  No lower extremity edema. 2/4 pulses in all 4 extremities. Respiratory: Clear to auscultation with no wheezes or rales. Good inspiratory effort. Abdomen: Soft, nontender nondistended with normal bowel sounds x4 quadrants. Muskuloskeletal: No cyanosis, clubbing or edema noted bilaterally Neuro: CN II-XII intact, strength 5/5 x 4, sensation, reflexes intact Skin: No ulcerative lesions noted or rashes Psychiatry: Judgement  and insight appear normal. Mood is appropriate for condition and setting          Labs on Admission:  Basic Metabolic Panel: Recent Labs  Lab 08/09/22 2200  NA 137  K 4.2  CL 105  CO2 23  GLUCOSE 104*  BUN 24*  CREATININE 1.11*  CALCIUM 9.3   Liver Function Tests: No results for input(s): "AST", "ALT", "ALKPHOS", "BILITOT", "PROT", "ALBUMIN" in the last 168 hours. No results for input(s): "LIPASE", "AMYLASE" in the last 168 hours. No results for input(s): "AMMONIA" in the last 168 hours. CBC: Recent Labs  Lab 08/09/22 2200  WBC 9.9  HGB 12.2  HCT 36.9  MCV 94.6  PLT 135*   Cardiac Enzymes: No results for input(s): "CKTOTAL", "CKMB", "CKMBINDEX", "TROPONINI" in the last 168 hours.  BNP (last 3 results) No results for input(s): "BNP" in the last 8760 hours.  ProBNP (last 3 results) No results for input(s): "PROBNP" in the last 8760 hours.  CBG: No results for input(s): "GLUCAP" in the last 168 hours.  Radiological Exams on Admission: CT PELVIS WO CONTRAST  Result Date:  08/09/2022 CLINICAL DATA:  Hip trauma with fracture suspected. X-ray suggested subcapital irregularity and possible sacral ala fracture. Fell to the right hip with pain. Trip and fall injury. EXAM: CT PELVIS WITHOUT CONTRAST TECHNIQUE: Multidetector CT imaging of the pelvis was performed following the standard protocol without intravenous contrast. RADIATION DOSE REDUCTION: This exam was performed according to the departmental dose-optimization program which includes automated exposure control, adjustment of the mA and/or kV according to patient size and/or use of iterative reconstruction technique. COMPARISON:  Right hip radiographs 1024 23. CT abdomen and pelvis 07/02/2016 FINDINGS: Urinary Tract: Bladder is unremarkable. There is a cyst in the right mid abdomen, incompletely included but likely representing a lower pole renal cyst and measuring 5.7 cm in diameter. No change since prior study. No imaging follow-up is indicated. Bowel: Visualized portions of small and large bowel are not abnormally distended. No wall thickening is appreciated. The visualized colon is diffusely stool-filled. The appendix is normal. Vascular/Lymphatic: Calcification of the lower aorta and iliac arteries. No aneurysm. Reproductive:  No mass or other significant abnormality Other: No free air or free fluid demonstrated. Abdominal wall musculature appears intact. Musculoskeletal: There is an acute subcapital fracture of the right femoral neck with mild impaction of fracture fragments and posterior angulation of the distal fracture fragments. No dislocation at the hip joint. The sacrum appears intact. No acute displaced fractures are identified to involve the sacrum, pelvis, or left hip. Degenerative changes in the lower lumbar spine and hips. IMPRESSION: 1. Acute impacted fracture of the right femoral neck. 2. No additional acute fractures are identified. 3. Degenerative changes in the lower lumbar spine and hips. Electronically Signed    By: Lucienne Capers M.D.   On: 08/09/2022 21:29   DG Hip Unilat  With Pelvis 2-3 Views Right  Result Date: 08/09/2022 CLINICAL DATA:  Fall at home today with right hip pain. EXAM: DG HIP (WITH OR WITHOUT PELVIS) 2-3V RIGHT COMPARISON:  None Available. FINDINGS: There is a cortical irregularity in the subcapital region of the proximal right femur suggesting impaction fracture. No dislocation. Cortical irregularity is also noted at the sacral ala on the right, possible fracture. Mild degenerative changes are noted at the hips bilaterally in the lower lumbar spine. IMPRESSION: 1. Findings suggestive of subcapital femoral fracture on the right. 2. Cortical irregularity in the sacral ala on the right, possible fracture. CT  pelvis is recommended for further evaluation. Electronically Signed   By: Brett Fairy M.D.   On: 08/09/2022 20:03    EKG: I independently viewed the EKG done and my findings are as followed: ***   Assessment/Plan Present on Admission: . Closed displaced fracture of right femoral neck ([place])  Principal Problem:   Closed displaced fracture of right femoral neck ([facility])   DVT prophylaxis: ***   Code Status: ***   Family Communication: ***   Disposition Plan: ***   Consults called: ***   Admission status: ***     Bernadette Hoit MD [facility] Pager [phone]  If 7PM-7AM, please contact night-coverage [email or web address] Password [facility]  08/09/2022, 11:44 PM     Review of Systems: {ROS_Text:26778} Past Medical History:  Diagnosis Date  . A-fib ([place])   . Anxiety   . Bradycardia    s/p PPM October 2014  . Chronic anticoagulation   . Dysrhythmia   . Headache(784.0)   . High cholesterol   . Hypertension   . Malignant neoplasm of upper-outer quadrant of right breast in female, estrogen receptor positive ([place]) 06/15/2018  . Multiple rib fractures 07/01/2016   fell down stairs   . Pulmonary contusion 06/2016   from a fall   . Stroke [facility]) September  2014   thrombolytic therapy  . Thyroid disease   . Vertical vertigo    Past Surgical History:  Procedure Laterality Date  . BREAST LUMPECTOMY WITH RADIOACTIVE SEED LOCALIZATION Right 07/13/2018   Procedure: BREAST LUMPECTOMY WITH RADIOACTIVE SEED LOCALIZATION;  Surgeon: Rolm Bookbinder, MD;  Location: [place];  Service: General;  Laterality: Right;  . CHOLECYSTECTOMY    . INSERT / REPLACE / REMOVE PACEMAKER    . PACEMAKER INSERTION    . PERMANENT PACEMAKER INSERTION N/A 07/25/2013   Procedure: PERMANENT PACEMAKER INSERTION;  Surgeon: Deboraha Sprang, MD;  Location: [facility] CATH LAB;  Service: Cardiovascular;  Laterality: N/A;  . TEE WITHOUT CARDIOVERSION N/A 07/16/2013   Procedure: TRANSESOPHAGEAL ECHOCARDIOGRAM (TEE);  Surgeon: Thayer Headings, MD;  Location: [place];  Service: Cardiovascular;  Laterality: N/A;   Social History:  reports that she has never smoked. She has never used smokeless tobacco. She reports that she does not drink alcohol and does not use drugs.  Allergies  Allergen Reactions  . Biaxin [Clarithromycin] Shortness Of Breath and Rash    Unknown  . Penicillins Hives    Has patient had a PCN reaction causing immediate rash, facial/tongue/throat swelling, SOB or lightheadedness with hypotension: Yes Has patient had a PCN reaction causing severe rash involving mucus membranes or skin necrosis: No Has patient had a PCN reaction that required hospitalization No Has patient had a PCN reaction occurring within the last 10 years: No If all of the above answers are "NO", then may proceed with Cephalosporin use.    . Shellfish Allergy Nausea And Vomiting  . Sulfa Antibiotics Other (See Comments)    "bad reaction," per patient  . Celebrex [Celecoxib] Rash    Family History  Problem Relation Age of Onset  . Cancer Mother   . Breast cancer Mother   . Cancer Father   . Lung cancer Father     Prior to Admission medications   Medication Sig Start Date End Date Taking?  Authorizing Provider  acetaminophen (TYLENOL) 500 MG tablet Take 500-1,000 mg by mouth every 6 (six) hours as needed for mild pain.   Yes [provider]  atorvastatin (LIPITOR) 10 MG tablet Take 10 mg by mouth  every evening.    Yes [provider]  calcium carbonate (OSCAL) 1500 (600 Ca) MG TABS tablet Take by mouth 2 (two) times daily with a meal.   Yes [provider]  cholecalciferol (VITAMIN D) 1000 UNITS tablet Take 1,000 Units by mouth daily.    Yes [provider]  ELIQUIS 5 MG TABS tablet TAKE 1 TABLET BY MOUTH TWICE A DAY 11/13/19  Yes Evans Lance, MD  flecainide (TAMBOCOR) 150 MG tablet TAKE HALF A TABLET (75 MG TOTAL) BY MOUTH 2 (TWO) TIMES DAILY. 08/05/22  Yes Evans Lance, MD  KRILL OIL PO Take by mouth.   Yes [provider]  levothyroxine (SYNTHROID, LEVOTHROID) 112 MCG tablet Take 112 mcg by mouth daily before breakfast. 06/18/18  Yes [provider]  lisinopril (ZESTRIL) 20 MG tablet Take 1 tablet (20 mg total) by mouth 2 (two) times daily. 04/28/22  Yes Nicholas Lose, MD  meclizine (ANTIVERT) 25 MG tablet Take 25 mg by mouth 3 (three) times daily as needed for dizziness.   Yes [provider]  metoprolol succinate (TOPROL XL) 25 MG 24 hr tablet Take 2-3 Times Daily as Directed. 01/04/22  Yes Evans Lance, MD  Multiple Vitamins-Minerals (PRESERVISION AREDS PO) Take 1 tablet by mouth daily.   Yes [provider]  omeprazole (PRILOSEC) 20 MG capsule Take 20 mg by mouth at bedtime.    Yes [provider]  Polyethyl Glycol-Propyl Glycol 0.4-0.3 % SOLN Place 1 drop into both eyes 3 (three) times daily as needed (for dry eyes.).   Yes [provider]  polyethylene glycol (MIRALAX / GLYCOLAX) packet Take 17 g by mouth daily as needed (for constipation.).    Yes [provider]  simethicone (MYLICON) 333 MG chewable tablet Chew 125-250 mg by mouth every 6 (six) hours as needed for  flatulence.   Yes [provider]    Physical Exam: Vitals:   08/09/22 2022 08/09/22 2030 08/09/22 2100 08/09/22 2200  BP: (!) 125/54 (!) 133/56 (!) 145/101 (!) 114/47  Pulse: 62 61 61 60  Resp: '17  19 20  '$ Temp: 98.7 F (37.1 C)     TempSrc: Oral     SpO2: 93% 92% 95% 95%   *** Data Reviewed: {Tip this will not be part of the note when signed- Document your independent interpretation of telemetry tracing, EKG, lab, Radiology test or any other diagnostic tests. Add any new diagnostic test ordered today. (Optional):26781} {Results:26384}  Assessment and Plan: No notes have been filed under this hospital service. Service: Hospitalist     Advance Care Planning:   Code Status: Prior ***  Consults: ***  Family Communication: ***  Severity of Illness: {Observation/Inpatient:21159}  Author: Bernadette Hoit, DO 08/09/2022 10:53 PM  For on call review [email or web address].

## 2022-08-09 NOTE — H&P (Signed)
History and Physical    Patient: Jessica Myers:[id number] DOB: 22-Oct-1928 DOA: 08/09/2022 DOS: the patient was seen and examined on 08/10/2022 PCP: Monico Blitz, MD  Patient coming from: Home  Chief Complaint:  Chief Complaint  Patient presents with   Fall   HPI: Jessica Myers is a 88 y.o. female with medical history significant of hyperlipidemia, hypothyroidism, SSS s/p pacemaker placement, GERD, A-fib on Eliquis who presents emergency department via EMS from home due to sustaining a fall at home.  Patient tripped on a rug and fell landing on right hip, though she was able to get up, she kept limping and the pain worsened throughout the day to the extent that patient cannot bear weight on the right leg anymore.  EMS was activated and patient was sent to the ED for further evaluation and management.  Patient lives at home with husband.  ED Course:  In the emergency department, he was hemodynamically stable, BP was 144/101, but other vital signs were within normal range.  Work-up in the ED showed normal CBC except for thrombocytopenia, BMP was normal except for blood glucose of 104 and BUN/creatinine of 10/1.11 (creatinine is better than baseline of 1.2-1.4). CT pelvis without contrast showed acute impacted fracture of the right femoral neck.  Degenerative changes in the lower lumbar spine and hips. X-ray of the right hip showed findings suggestive of subcapital femoral fracture on the right. Cortical irregularity in the sacral ala on the right, possible fracture.  Tylenol was given.  Orthopedic surgeon (Dr. Amedeo Kinsman) was consulted and recommended admitting patient with plan to consult on patient in the morning and with possible surgical repair on Thursday.  Last Eliquis was this morning.  Hospitalist was asked to admit patient for further evaluation and management.  Review of Systems: Review of systems as noted in the HPI. All other systems reviewed and are negative.   Past Medical  History:  Diagnosis Date   A-fib [facility])    Anxiety    Bradycardia    s/p PPM October 2014   Chronic anticoagulation    Dysrhythmia    Headache(784.0)    High cholesterol    Hypertension    Malignant neoplasm of upper-outer quadrant of right breast in female, estrogen receptor positive ([place]) 06/15/2018   Multiple rib fractures 07/01/2016   fell down stairs    Pulmonary contusion 06/2016   from a fall    Stroke [facility]) September 2014   thrombolytic therapy   Thyroid disease    Vertical vertigo    Past Surgical History:  Procedure Laterality Date   BREAST LUMPECTOMY WITH RADIOACTIVE SEED LOCALIZATION Right 07/13/2018   Procedure: BREAST LUMPECTOMY WITH RADIOACTIVE SEED LOCALIZATION;  Surgeon: Rolm Bookbinder, MD;  Location: [place];  Service: General;  Laterality: Right;   CHOLECYSTECTOMY     INSERT / REPLACE / [place] N/A 07/25/2013   Procedure: PERMANENT PACEMAKER INSERTION;  Surgeon: Deboraha Sprang, MD;  Location: [facility] CATH LAB;  Service: Cardiovascular;  Laterality: N/A;   TEE WITHOUT CARDIOVERSION N/A 07/16/2013   Procedure: TRANSESOPHAGEAL ECHOCARDIOGRAM (TEE);  Surgeon: Thayer Headings, MD;  Location: [place];  Service: Cardiovascular;  Laterality: N/A;    Social History:  reports that she has never smoked. She has never used smokeless tobacco. She reports that she does not drink alcohol and does not use drugs.   Allergies  Allergen Reactions   Biaxin [Clarithromycin] Shortness Of Breath  and Rash    Unknown   Penicillins Hives    Has patient had a PCN reaction causing immediate rash, facial/tongue/throat swelling, SOB or lightheadedness with hypotension: Yes Has patient had a PCN reaction causing severe rash involving mucus membranes or skin necrosis: No Has patient had a PCN reaction that required hospitalization No Has patient had a PCN reaction occurring within the last 10 years: No If all of the  above answers are "NO", then may proceed with Cephalosporin use.     Shellfish Allergy Nausea And Vomiting   Sulfa Antibiotics Other (See Comments)    "bad reaction," per patient   Celebrex [Celecoxib] Rash    Family History  Problem Relation Age of Onset   Cancer Mother    Breast cancer Mother    Cancer Father    Lung cancer Father      Prior to Admission medications   Medication Sig Start Date End Date Taking? Authorizing Provider  acetaminophen (TYLENOL) 500 MG tablet Take 500-1,000 mg by mouth every 6 (six) hours as needed for mild pain.   Yes [provider]  atorvastatin (LIPITOR) 10 MG tablet Take 10 mg by mouth every evening.    Yes [provider]  calcium carbonate (OSCAL) 1500 (600 Ca) MG TABS tablet Take by mouth 2 (two) times daily with a meal.   Yes [provider]  cholecalciferol (VITAMIN D) 1000 UNITS tablet Take 1,000 Units by mouth daily.    Yes [provider]  ELIQUIS 5 MG TABS tablet TAKE 1 TABLET BY MOUTH TWICE A DAY 11/13/19  Yes Evans Lance, MD  flecainide (TAMBOCOR) 150 MG tablet TAKE HALF A TABLET (75 MG TOTAL) BY MOUTH 2 (TWO) TIMES DAILY. 08/05/22  Yes Evans Lance, MD  KRILL OIL PO Take by mouth.   Yes [provider]  levothyroxine (SYNTHROID, LEVOTHROID) 112 MCG tablet Take 112 mcg by mouth daily before breakfast. 06/18/18  Yes [provider]  lisinopril (ZESTRIL) 20 MG tablet Take 1 tablet (20 mg total) by mouth 2 (two) times daily. 04/28/22  Yes Nicholas Lose, MD  meclizine (ANTIVERT) 25 MG tablet Take 25 mg by mouth 3 (three) times daily as needed for dizziness.   Yes [provider]  metoprolol succinate (TOPROL XL) 25 MG 24 hr tablet Take 2-3 Times Daily as Directed. 01/04/22  Yes Evans Lance, MD  Multiple Vitamins-Minerals (PRESERVISION AREDS PO) Take 1 tablet by mouth daily.   Yes [provider]  omeprazole (PRILOSEC) 20 MG capsule Take 20 mg by mouth at bedtime.     Yes [provider]  Polyethyl Glycol-Propyl Glycol 0.4-0.3 % SOLN Place 1 drop into both eyes 3 (three) times daily as needed (for dry eyes.).   Yes [provider]  polyethylene glycol (MIRALAX / GLYCOLAX) packet Take 17 g by mouth daily as needed (for constipation.).    Yes [provider]  simethicone (MYLICON) 945 MG chewable tablet Chew 125-250 mg by mouth every 6 (six) hours as needed for flatulence.   Yes [provider]    Physical Exam: BP (!) 114/47   Pulse 60   Temp 98.7 F (37.1 C) (Oral)   Resp 20   SpO2 95%   General: 88 y.o. year-old female well developed, well nourished in no acute distress.  Alert and oriented x3. HEENT: NCAT, EOMI Neck: Supple, trachea medial Cardiovascular: Regular rate and rhythm with no rubs or gallops.  No thyromegaly or JVD noted.  No lower  extremity edema. 2/4 pulses in all 4 extremities. Respiratory: Clear to auscultation with no wheezes or rales. Good inspiratory effort. Abdomen: Soft, nontender nondistended with normal bowel sounds x4 quadrants. Muskuloskeletal: Tender to palpation of right hip.  Restricted ROM of RLE due to femoral neck fracture.  RLE swelling > LLE Neuro: CN II-XII intact, sensation, reflexes intact Skin: No ulcerative lesions noted or rashes Psychiatry: Mood is appropriate for condition and setting          Labs on Admission:  Basic Metabolic Panel: Recent Labs  Lab 08/09/22 2200  NA 137  K 4.2  CL 105  CO2 23  GLUCOSE 104*  BUN 24*  CREATININE 1.11*  CALCIUM 9.3   Liver Function Tests: No results for input(s): "AST", "ALT", "ALKPHOS", "BILITOT", "PROT", "ALBUMIN" in the last 168 hours. No results for input(s): "LIPASE", "AMYLASE" in the last 168 hours. No results for input(s): "AMMONIA" in the last 168 hours. CBC: Recent Labs  Lab 08/09/22 2200  WBC 9.9  HGB 12.2  HCT 36.9  MCV 94.6  PLT 135*   Cardiac Enzymes: No results for input(s): "CKTOTAL", "CKMB",  "CKMBINDEX", "TROPONINI" in the last 168 hours.  BNP (last 3 results) No results for input(s): "BNP" in the last 8760 hours.  ProBNP (last 3 results) No results for input(s): "PROBNP" in the last 8760 hours.  CBG: No results for input(s): "GLUCAP" in the last 168 hours.  Radiological Exams on Admission: CT PELVIS WO CONTRAST  Result Date: 08/09/2022 CLINICAL DATA:  Hip trauma with fracture suspected. X-ray suggested subcapital irregularity and possible sacral ala fracture. Fell to the right hip with pain. Trip and fall injury. EXAM: CT PELVIS WITHOUT CONTRAST TECHNIQUE: Multidetector CT imaging of the pelvis was performed following the standard protocol without intravenous contrast. RADIATION DOSE REDUCTION: This exam was performed according to the departmental dose-optimization program which includes automated exposure control, adjustment of the mA and/or kV according to patient size and/or use of iterative reconstruction technique. COMPARISON:  Right hip radiographs 1024 23. CT abdomen and pelvis 07/02/2016 FINDINGS: Urinary Tract: Bladder is unremarkable. There is a cyst in the right mid abdomen, incompletely included but likely representing a lower pole renal cyst and measuring 5.7 cm in diameter. No change since prior study. No imaging follow-up is indicated. Bowel: Visualized portions of small and large bowel are not abnormally distended. No wall thickening is appreciated. The visualized colon is diffusely stool-filled. The appendix is normal. Vascular/Lymphatic: Calcification of the lower aorta and iliac arteries. No aneurysm. Reproductive:  No mass or other significant abnormality Other: No free air or free fluid demonstrated. Abdominal wall musculature appears intact. Musculoskeletal: There is an acute subcapital fracture of the right femoral neck with mild impaction of fracture fragments and posterior angulation of the distal fracture fragments. No dislocation at the hip joint. The sacrum  appears intact. No acute displaced fractures are identified to involve the sacrum, pelvis, or left hip. Degenerative changes in the lower lumbar spine and hips. IMPRESSION: 1. Acute impacted fracture of the right femoral neck. 2. No additional acute fractures are identified. 3. Degenerative changes in the lower lumbar spine and hips. Electronically Signed   By: Lucienne Capers M.D.   On: 08/09/2022 21:29   DG Hip Unilat  With Pelvis 2-3 Views Right  Result Date: 08/09/2022 CLINICAL DATA:  Fall at home today with right hip pain. EXAM: DG HIP (WITH OR WITHOUT PELVIS) 2-3V RIGHT COMPARISON:  None Available. FINDINGS: There is a cortical irregularity in the subcapital  region of the proximal right femur suggesting impaction fracture. No dislocation. Cortical irregularity is also noted at the sacral ala on the right, possible fracture. Mild degenerative changes are noted at the hips bilaterally in the lower lumbar spine. IMPRESSION: 1. Findings suggestive of subcapital femoral fracture on the right. 2. Cortical irregularity in the sacral ala on the right, possible fracture. CT pelvis is recommended for further evaluation. Electronically Signed   By: Brett Fairy M.D.   On: 08/09/2022 20:03    EKG: I independently viewed the EKG done and my findings are as followed: EKG was not done in the ED  Assessment/Plan Present on Admission:  Closed displaced fracture of right femoral neck (HCC)  Atrial fibrillation ([place])  Principal Problem:   Closed displaced fracture of right femoral neck (HCC) Active Problems:   Essential hypertension   Mixed hyperlipidemia   Acquired hypothyroidism   Atrial fibrillation (HCC)   Thrombocytopenia (HCC)   GERD (gastroesophageal reflux disease)   Vitamin D deficiency   Right leg swelling   History of CVA (cerebrovascular accident)  Acute right femoral neck fracture CT pelvis without contrast showed acute impacted fracture of the right femoral neck. Continue Tylenol as  needed Continue fall precaution Orthopedic surgery (Dr. Amedeo Kinsman) was already consulted and will follow-up with patient in the morning per ED physician Consider PT/OT eval and treat status post surgical repair of right femoral neck  Thrombocytopenia possibly reactive Platelets 135, continue to monitor  RLE swelling Right lower extremity ultrasound to be done in the morning to rule out DVT  Mixed hyperlipidemia Continue Lipitor  History of CVA Continue Lipitor Eliquis currently held in anticipation for surgical repair of right femoral neck  Acquired hypothyroidism Continue Synthroid  GERD Continue Protonix  A-fib on Eliquis Continue flecainide Metoprolol will be held at this time due to soft BP Eliquis will be held at this time in anticipation for surgical repair of femoral neck fracture  Essential hypertension Home meds will be held at this time due to soft BP  Vitamin D deficiency Continue vitamin D and Os-Cal  DVT prophylaxis: SCDs  Code Status: Full code  Consults: Orthopedic surgery  Family Communication: None at bedside  Severity of Illness: The appropriate patient status for this patient is INPATIENT. Inpatient status is judged to be reasonable and necessary in order to provide the required intensity of service to ensure the patient's safety. The patient's presenting symptoms, physical exam findings, and initial radiographic and laboratory data in the context of their chronic comorbidities is felt to place them at high risk for further clinical deterioration. Furthermore, it is not anticipated that the patient will be medically stable for discharge from the hospital within 2 midnights of admission.   * I certify that at the point of admission it is my clinical judgment that the patient will require inpatient hospital care spanning beyond 2 midnights from the point of admission due to high intensity of service, high risk for further deterioration and high frequency of  surveillance required.*  Author: Bernadette Hoit, DO 08/10/2022 1:11 AM  For on call review [email or web address].

## 2022-08-09 NOTE — ED Triage Notes (Signed)
Patient tri[[ed on rug and fell at home. Husband was there to get her up right away. As the day passed she felt more pain and increasing tension in right hip and groin. She is unable to bear weight on the right leg. CBG and VSS. Pt is A&OX4. Husband is at the bedside.. Pt does have an active pacemaker.

## 2022-08-10 ENCOUNTER — Encounter (HOSPITAL_COMMUNITY): Payer: Self-pay | Admitting: Internal Medicine

## 2022-08-10 ENCOUNTER — Inpatient Hospital Stay (HOSPITAL_COMMUNITY): Payer: PPO

## 2022-08-10 DIAGNOSIS — M7989 Other specified soft tissue disorders: Secondary | ICD-10-CM

## 2022-08-10 DIAGNOSIS — D696 Thrombocytopenia, unspecified: Secondary | ICD-10-CM

## 2022-08-10 DIAGNOSIS — I4819 Other persistent atrial fibrillation: Secondary | ICD-10-CM | POA: Diagnosis not present

## 2022-08-10 DIAGNOSIS — S72001A Fracture of unspecified part of neck of right femur, initial encounter for closed fracture: Secondary | ICD-10-CM | POA: Diagnosis not present

## 2022-08-10 DIAGNOSIS — K219 Gastro-esophageal reflux disease without esophagitis: Secondary | ICD-10-CM

## 2022-08-10 DIAGNOSIS — E559 Vitamin D deficiency, unspecified: Secondary | ICD-10-CM

## 2022-08-10 DIAGNOSIS — I10 Essential (primary) hypertension: Secondary | ICD-10-CM | POA: Diagnosis not present

## 2022-08-10 DIAGNOSIS — Z8673 Personal history of transient ischemic attack (TIA), and cerebral infarction without residual deficits: Secondary | ICD-10-CM

## 2022-08-10 DIAGNOSIS — E039 Hypothyroidism, unspecified: Secondary | ICD-10-CM | POA: Diagnosis not present

## 2022-08-10 LAB — CBC
HCT: 36.1 % (ref 36.0–46.0)
Hemoglobin: 11.7 g/dL — ABNORMAL LOW (ref 12.0–15.0)
MCH: 31.1 pg (ref 26.0–34.0)
MCHC: 32.4 g/dL (ref 30.0–36.0)
MCV: 96 fL (ref 80.0–100.0)
Platelets: 126 10*3/uL — ABNORMAL LOW (ref 150–400)
RBC: 3.76 MIL/uL — ABNORMAL LOW (ref 3.87–5.11)
RDW: 12.4 % (ref 11.5–15.5)
WBC: 7.8 10*3/uL (ref 4.0–10.5)
nRBC: 0 % (ref 0.0–0.2)

## 2022-08-10 LAB — COMPREHENSIVE METABOLIC PANEL
ALT: 13 U/L (ref 0–44)
AST: 20 U/L (ref 15–41)
Albumin: 3.3 g/dL — ABNORMAL LOW (ref 3.5–5.0)
Alkaline Phosphatase: 57 U/L (ref 38–126)
Anion gap: 7 (ref 5–15)
BUN: 23 mg/dL (ref 8–23)
CO2: 25 mmol/L (ref 22–32)
Calcium: 9.2 mg/dL (ref 8.9–10.3)
Chloride: 106 mmol/L (ref 98–111)
Creatinine, Ser: 1.09 mg/dL — ABNORMAL HIGH (ref 0.44–1.00)
GFR, Estimated: 48 mL/min — ABNORMAL LOW (ref >60)
Glucose, Bld: 95 mg/dL (ref 70–99)
Potassium: 4 mmol/L (ref 3.5–5.1)
Sodium: 138 mmol/L (ref 135–145)
Total Bilirubin: 1.6 mg/dL — ABNORMAL HIGH (ref 0.3–1.2)
Total Protein: 5.8 g/dL — ABNORMAL LOW (ref 6.5–8.1)

## 2022-08-10 LAB — PHOSPHORUS: Phosphorus: 3.3 mg/dL (ref 2.5–4.6)

## 2022-08-10 LAB — MRSA NEXT GEN BY PCR, NASAL: MRSA by PCR Next Gen: NOT DETECTED

## 2022-08-10 LAB — MAGNESIUM: Magnesium: 1.8 mg/dL (ref 1.7–2.4)

## 2022-08-10 MED ORDER — ATORVASTATIN CALCIUM 10 MG PO TABS
10.0000 mg | ORAL_TABLET | Freq: Every evening | ORAL | Status: DC
  Administered 2022-08-10 – 2022-08-14 (×5): 10 mg via ORAL
  Filled 2022-08-10 (×5): qty 1

## 2022-08-10 MED ORDER — VITAMIN D 25 MCG (1000 UNIT) PO TABS
1000.0000 [IU] | ORAL_TABLET | Freq: Every day | ORAL | Status: DC
  Administered 2022-08-10 – 2022-08-15 (×6): 1000 [IU] via ORAL
  Filled 2022-08-10 (×6): qty 1

## 2022-08-10 MED ORDER — TRANEXAMIC ACID-NACL 1000-0.7 MG/100ML-% IV SOLN
1000.0000 mg | INTRAVENOUS | Status: AC
  Administered 2022-08-11: 1000 mg via INTRAVENOUS

## 2022-08-10 MED ORDER — PANTOPRAZOLE SODIUM 40 MG PO TBEC
40.0000 mg | DELAYED_RELEASE_TABLET | Freq: Every day | ORAL | Status: DC
  Administered 2022-08-10 – 2022-08-15 (×6): 40 mg via ORAL
  Filled 2022-08-10 (×6): qty 1

## 2022-08-10 MED ORDER — FLECAINIDE ACETATE 50 MG PO TABS
75.0000 mg | ORAL_TABLET | Freq: Two times a day (BID) | ORAL | Status: DC
  Administered 2022-08-10 – 2022-08-15 (×11): 75 mg via ORAL
  Filled 2022-08-10 (×11): qty 2

## 2022-08-10 MED ORDER — CEFAZOLIN SODIUM-DEXTROSE 2-4 GM/100ML-% IV SOLN
2.0000 g | INTRAVENOUS | Status: AC
  Administered 2022-08-11: 2 g via INTRAVENOUS

## 2022-08-10 MED ORDER — CALCIUM CARBONATE 1250 (500 CA) MG PO TABS
1.0000 | ORAL_TABLET | Freq: Two times a day (BID) | ORAL | Status: DC
  Administered 2022-08-10 – 2022-08-15 (×11): 1250 mg via ORAL
  Filled 2022-08-10 (×11): qty 1

## 2022-08-10 MED ORDER — ACETAMINOPHEN 650 MG RE SUPP: 650.0000 mg | Freq: Four times a day (QID) | RECTAL | Status: DC | PRN

## 2022-08-10 MED ORDER — LEVOTHYROXINE SODIUM 112 MCG PO TABS
112.0000 ug | ORAL_TABLET | Freq: Every day | ORAL | Status: DC
  Administered 2022-08-10 – 2022-08-15 (×6): 112 ug via ORAL
  Filled 2022-08-10 (×6): qty 1

## 2022-08-10 MED ORDER — ACETAMINOPHEN 325 MG PO TABS
650.0000 mg | ORAL_TABLET | Freq: Four times a day (QID) | ORAL | Status: DC | PRN
  Administered 2022-08-10 – 2022-08-15 (×13): 650 mg via ORAL
  Filled 2022-08-10 (×13): qty 2

## 2022-08-10 NOTE — NC FL2 (Signed)
[place] MEDICAID FL2 LEVEL OF CARE SCREENING TOOL     IDENTIFICATION  Patient Name: Jessica Myers Birthdate: 19-Oct-1928 Sex: female Admission Date (Current Location): 08/09/2022  [facility] and [place] Number:  [employer] and Address:  [place] [address] [place]      Provider Number: [id number]  Attending Physician Name and Address:  Kathie Dike, MD  Relative Name and Phone Number:       Current Level of Care: Hospital Recommended Level of Care: [place] Prior Approval Number:    Date Approved/Denied:   PASRR Number: [id number]  Discharge Plan: SNF    Current Diagnoses: Patient Active Problem List   Diagnosis Date Noted   Thrombocytopenia ([place]) 08/10/2022   GERD (gastroesophageal reflux disease) 08/10/2022   Vitamin D deficiency 08/10/2022   Right leg swelling 08/10/2022   History of CVA (cerebrovascular accident) 08/10/2022   Closed displaced fracture of right femoral neck ([place]) 08/09/2022   Contusion of hip, left 10/22/2020   Metacarpal bone fracture 10/01/2020   Breast cancer, right ([place]) 07/13/2018   Malignant neoplasm of upper-outer quadrant of right breast in female, estrogen receptor positive ([place]) 06/15/2018   Fall down stairs 07/05/2016   Long-term (current) use of anticoagulants 07/05/2016   Closed fracture of distal phalanx of digit of left hand 07/05/2016   Pulmonary contusion 07/05/2016   Traumatic pneumothorax 07/05/2016   Acute blood loss anemia 07/05/2016   Multiple fractures of ribs of right side 07/02/2016   Musculoskeletal arm pain 01/03/2014   Hoarseness, persistent 01/03/2014   Pacemaker 10/24/2013   Atrial fibrillation ([place]) 10/24/2013   Syncope 07/25/2013   Stroke ([place]) 07/16/2013   Acute right MCA stroke ([place]) 07/16/2013   Acute left hemiparesis ([place]) 07/16/2013   Essential hypertension 07/16/2013   Mixed hyperlipidemia 07/16/2013   Dysarthria as late effect of  stroke 07/16/2013   Acquired hypothyroidism 07/16/2013   Encounter for long-term (current) use of medications 07/16/2013    Orientation RESPIRATION BLADDER Height & Weight     Self, Time, Situation, Place  Normal External catheter Weight:   Height:     BEHAVIORAL SYMPTOMS/MOOD NEUROLOGICAL BOWEL NUTRITION STATUS      Continent Diet (Heart healthy. See d/c summary for updates.)  AMBULATORY STATUS COMMUNICATION OF NEEDS Skin   Extensive Assist Verbally Surgical wounds                       Personal Care Assistance Level of Assistance  Bathing, Dressing, Feeding Bathing Assistance: Maximum assistance Feeding assistance: Limited assistance Dressing Assistance: Maximum assistance     Functional Limitations Info  Sight, Hearing, Speech Sight Info: Adequate Hearing Info: Adequate Speech Info: Adequate    SPECIAL CARE FACTORS FREQUENCY  PT (By licensed PT)     PT Frequency: 5x weekly              Contractures      Additional Factors Info  Code Status, Allergies Code Status Info: Full code Allergies Info: Biaxin (clarithromycin), Penicillins, Shellfish Allergy, Sulfa Antibiotics, Celebrex (celecoxib)           Current Medications (08/10/2022):  This is the current hospital active medication list Current Facility-Administered Medications  Medication Dose Route Frequency Provider Last Rate Last Admin   acetaminophen (TYLENOL) tablet 650 mg  650 mg Oral Q6H PRN Adefeso, Oladapo, DO   650 mg at 08/10/22 0529   Or   acetaminophen (TYLENOL) suppository 650 mg  650 mg Rectal  Q6H PRN Bernadette Hoit, DO       atorvastatin (LIPITOR) tablet 10 mg  10 mg Oral QPM Adefeso, Oladapo, DO       calcium carbonate (OS-CAL - dosed in mg of elemental calcium) tablet 1,250 mg  1 tablet Oral BID WC Adefeso, Oladapo, DO   1,250 mg at 08/10/22 1003   cholecalciferol (VITAMIN D3) 25 MCG (1000 UNIT) tablet 1,000 Units  1,000 Units Oral Daily Adefeso, Oladapo, DO   1,000 Units at  08/10/22 1005   flecainide (TAMBOCOR) tablet 75 mg  75 mg Oral Q12H Adefeso, Oladapo, DO   75 mg at 08/10/22 0958   levothyroxine (SYNTHROID) tablet 112 mcg  112 mcg Oral Q0600 Adefeso, Oladapo, DO   112 mcg at 08/10/22 0529   pantoprazole (PROTONIX) EC tablet 40 mg  40 mg Oral Daily Adefeso, Oladapo, DO   40 mg at 08/10/22 1006     Discharge Medications: Please see discharge summary for a list of discharge medications.  Relevant Imaging Results:  Relevant Lab Results:   Additional Information SSN: [id number]  Salome Arnt, LCSW

## 2022-08-10 NOTE — Progress Notes (Signed)
PROGRESS NOTE    Jessica Myers  HWE:[id number] DOB: 12/17/1928 DOA: 08/09/2022 PCP: Monico Blitz, MD    Brief Narrative:  88 year old female who unfortunately suffered a fall at home after she tripped on her rug, admitted to the hospital with right hip fracture.  She has a history of paroxysmal atrial fibrillation on anticoagulation.  Eliquis was held on admission for anticipated surgery.  Heart rate is currently in sinus rhythm.  She was seen by orthopedics and plan is for operative management on 10/26.   Assessment & Plan:   Principal Problem:   Closed displaced fracture of right femoral neck (HCC) Active Problems:   Essential hypertension   Mixed hyperlipidemia   Acquired hypothyroidism   Atrial fibrillation (HCC)   Thrombocytopenia (HCC)   GERD (gastroesophageal reflux disease)   Vitamin D deficiency   Right leg swelling   History of CVA (cerebrovascular accident)   Acute right femoral neck fracture Mechanical fall -Seen by orthopedics -Plan for operative management on 10/26 -PT OT postsurgery -Continue pain management  Right lower extremity swelling -Ultrasound negative for DVT -Likely related to fracture  Hyperlipidemia -Continue Lipitor  Paroxysmal atrial fibrillation -Currently in sinus rhythm -She is continued on flecainide -She normally takes Toprol 25 mg twice daily, this was held due to soft blood pressures -We will resume as blood pressures allow -Chronically on Eliquis, held for surgery  Hypothyroidism -Continue Synthroid  History of CVA -Continue on Lipitor -Eliquis on hold for surgery  Perioperative cardiac risk assessment -Per Lyndel Safe perioperative cardiac risk assessment, patient has 1% risk of myocardial infarction or cardiac arrest intraoperatively or up to 30 days postop. -Since she does not have any recent cardiac symptoms, she is optimized from a cardiac standpoint for surgery -We will plan on resuming beta-blockers as blood pressure  tolerates.   DVT prophylaxis: SCDs Start: 08/10/22 0405  Code Status: Full code Family Communication: Husband at the bedside Disposition Plan: Status is: Inpatient Remains inpatient appropriate because: Needs operative management of hip fracture     Consultants:  Orthopedics  Procedures:    Antimicrobials:      Subjective: Reports that her pain is reasonably controlled.  She has not had any recent chest pain, shortness of breath or palpitations  Objective: Vitals:   08/10/22 0827 08/10/22 1300 08/10/22 1432 08/10/22 1442  BP: (!) 103/52 (!) 102/50 (!) 102/47   Pulse: 61 62 63   Resp: '20 18 20   '$ Temp: 97.7 F (36.5 C) 98 F (36.7 C) 98.2 F (36.8 C)   TempSrc: Oral Oral Oral Oral  SpO2: 94%  97%   Weight:   58.5 kg   Height:   '5\' 4"'$  (1.626 m)     Intake/Output Summary (Last 24 hours) at 08/10/2022 1921 Last data filed at 08/10/2022 1300 Gross per 24 hour  Intake 480 ml  Output --  Net 480 ml   Filed Weights   08/10/22 1432  Weight: 58.5 kg    Examination:  General exam: Appears calm and comfortable  Respiratory system: Clear to auscultation. Respiratory effort normal. Cardiovascular system: S1 & S2 heard, RRR. No JVD, murmurs, rubs, gallops or clicks. No pedal edema. Gastrointestinal system: Abdomen is nondistended, soft and nontender. No organomegaly or masses felt. Normal bowel sounds heard. Central nervous system: Alert and oriented. No focal neurological deficits. Extremities: Symmetric 5 x 5 power. Skin: No rashes, lesions or ulcers Psychiatry: Judgement and insight appear normal. Mood & affect appropriate.     Data Reviewed: I have personally  reviewed following labs and imaging studies  CBC: Recent Labs  Lab 08/09/22 2200 08/10/22 0432  WBC 9.9 7.8  HGB 12.2 11.7*  HCT 36.9 36.1  MCV 94.6 96.0  PLT 135* 270*   Basic Metabolic Panel: Recent Labs  Lab 08/09/22 2200 08/10/22 0432  NA 137 138  K 4.2 4.0  CL 105 106  CO2 23 25   GLUCOSE 104* 95  BUN 24* 23  CREATININE 1.11* 1.09*  CALCIUM 9.3 9.2  MG  --  1.8  PHOS  --  3.3   GFR: Estimated Creatinine Clearance: 29 mL/min (A) (by C-G formula based on SCr of 1.09 mg/dL (H)). Liver Function Tests: Recent Labs  Lab 08/10/22 0432  AST 20  ALT 13  ALKPHOS 57  BILITOT 1.6*  PROT 5.8*  ALBUMIN 3.3*   No results for input(s): "LIPASE", "AMYLASE" in the last 168 hours. No results for input(s): "AMMONIA" in the last 168 hours. Coagulation Profile: No results for input(s): "INR", "PROTIME" in the last 168 hours. Cardiac Enzymes: No results for input(s): "CKTOTAL", "CKMB", "CKMBINDEX", "TROPONINI" in the last 168 hours. BNP (last 3 results) No results for input(s): "PROBNP" in the last 8760 hours. HbA1C: No results for input(s): "HGBA1C" in the last 72 hours. CBG: No results for input(s): "GLUCAP" in the last 168 hours. Lipid Profile: No results for input(s): "CHOL", "HDL", "LDLCALC", "TRIG", "CHOLHDL", "LDLDIRECT" in the last 72 hours. Thyroid Function Tests: No results for input(s): "TSH", "T4TOTAL", "FREET4", "T3FREE", "THYROIDAB" in the last 72 hours. Anemia Panel: No results for input(s): "VITAMINB12", "FOLATE", "FERRITIN", "TIBC", "IRON", "RETICCTPCT" in the last 72 hours. Sepsis Labs: No results for input(s): "PROCALCITON", "LATICACIDVEN" in the last 168 hours.  No results found for this or any previous visit (from the past 240 hour(s)).       Radiology Studies: US Venous Img Lower Unilateral Right (DVT)  Result Date: 08/10/2022 CLINICAL DATA:  Right leg swelling EXAM: Right LOWER EXTREMITY VENOUS DOPPLER ULTRASOUND TECHNIQUE: Gray-scale sonography with compression, as well as color and duplex ultrasound, were performed to evaluate the deep venous system(s) from the level of the common femoral vein through the popliteal and proximal calf veins. COMPARISON:  None Available. FINDINGS: VENOUS Normal compressibility of the common femoral,  superficial femoral, and popliteal veins, as well as the visualized calf veins. Visualized portions of profunda femoral vein and great saphenous vein unremarkable. No filling defects to suggest DVT on grayscale or color Doppler imaging. Doppler waveforms show normal direction of venous flow, normal respiratory plasticity and response to augmentation. Limited views of the contralateral common femoral vein are unremarkable. OTHER None. Limitations: none IMPRESSION: No evidence of DVT in the right lower extremity. Electronically Signed   By: Marin Roberts M.D.   On: 08/10/2022 11:28   CT PELVIS WO CONTRAST  Result Date: 08/09/2022 CLINICAL DATA:  Hip trauma with fracture suspected. X-ray suggested subcapital irregularity and possible sacral ala fracture. Fell to the right hip with pain. Trip and fall injury. EXAM: CT PELVIS WITHOUT CONTRAST TECHNIQUE: Multidetector CT imaging of the pelvis was performed following the standard protocol without intravenous contrast. RADIATION DOSE REDUCTION: This exam was performed according to the departmental dose-optimization program which includes automated exposure control, adjustment of the mA and/or kV according to patient size and/or use of iterative reconstruction technique. COMPARISON:  Right hip radiographs 1024 23. CT abdomen and pelvis 07/02/2016 FINDINGS: Urinary Tract: Bladder is unremarkable. There is a cyst in the right mid abdomen, incompletely included but likely representing a  lower pole renal cyst and measuring 5.7 cm in diameter. No change since prior study. No imaging follow-up is indicated. Bowel: Visualized portions of small and large bowel are not abnormally distended. No wall thickening is appreciated. The visualized colon is diffusely stool-filled. The appendix is normal. Vascular/Lymphatic: Calcification of the lower aorta and iliac arteries. No aneurysm. Reproductive:  No mass or other significant abnormality Other: No free air or free fluid  demonstrated. Abdominal wall musculature appears intact. Musculoskeletal: There is an acute subcapital fracture of the right femoral neck with mild impaction of fracture fragments and posterior angulation of the distal fracture fragments. No dislocation at the hip joint. The sacrum appears intact. No acute displaced fractures are identified to involve the sacrum, pelvis, or left hip. Degenerative changes in the lower lumbar spine and hips. IMPRESSION: 1. Acute impacted fracture of the right femoral neck. 2. No additional acute fractures are identified. 3. Degenerative changes in the lower lumbar spine and hips. Electronically Signed   By: Lucienne Capers M.D.   On: 08/09/2022 21:29   DG Hip Unilat  With Pelvis 2-3 Views Right  Result Date: 08/09/2022 CLINICAL DATA:  Fall at home today with right hip pain. EXAM: DG HIP (WITH OR WITHOUT PELVIS) 2-3V RIGHT COMPARISON:  None Available. FINDINGS: There is a cortical irregularity in the subcapital region of the proximal right femur suggesting impaction fracture. No dislocation. Cortical irregularity is also noted at the sacral ala on the right, possible fracture. Mild degenerative changes are noted at the hips bilaterally in the lower lumbar spine. IMPRESSION: 1. Findings suggestive of subcapital femoral fracture on the right. 2. Cortical irregularity in the sacral ala on the right, possible fracture. CT pelvis is recommended for further evaluation. Electronically Signed   By: Brett Fairy M.D.   On: 08/09/2022 20:03        Scheduled Meds:  atorvastatin  10 mg Oral QPM   calcium carbonate  1 tablet Oral BID WC   cholecalciferol  1,000 Units Oral Daily   flecainide  75 mg Oral Q12H   levothyroxine  112 mcg Oral Q0600   pantoprazole  40 mg Oral Daily   Continuous Infusions:  [START ON 08/11/2022]  ceFAZolin (ANCEF) IV     [START ON 08/11/2022] tranexamic acid       LOS: 1 day    Time spent: 20mns    JKathie Dike MD [facility]   If 7PM-7AM, please contact night-coverage [email or web address]  08/10/2022, 7:21 PM

## 2022-08-10 NOTE — TOC Initial Note (Signed)
Transition of Care [facility]) - Initial/Assessment Note    Patient Details  Name: Jessica Myers MRN: [id number] Date of Birth: 31-Mar-1929  Transition of Care [facility]) CM/SW Contact:    Salome Arnt, [place] Phone Number: 08/10/2022, 10:19 AM  Clinical Narrative:  Pt admitted following fall at home, resulting in right hip fracture. Pt reports she lives with her husband. She is very independent at baseline. Surgery scheduled for tomorrow. Discussed with pt that PT would make recommendations following surgery. Pt has never been to SNF before and is hopeful she can return home. LCSW will follow up after surgery.                    Barriers to Discharge: Continued Medical Work up   Patient Goals and CMS Choice Patient states their goals for this hospitalization and ongoing recovery are:: to be determined   Choice offered to / list presented to : Patient  Expected Discharge Plan and Services   In-house Referral: Clinical Social Work     Living arrangements for the past 2 months: Single Family Home                                      Prior Living Arrangements/Services Living arrangements for the past 2 months: Single Family Home Lives with:: Spouse Patient language and need for interpreter reviewed:: Yes Do you feel safe going back to the place where you live?: Yes      Need for Family Participation in Patient Care: No (Comment)   Current home services: DME (cane, walker) Criminal Activity/Legal Involvement Pertinent to Current Situation/Hospitalization: No - Comment as needed  Activities of Daily Living Home Assistive Devices/Equipment: None ADL Screening (condition at time of admission) Patient's cognitive ability adequate to safely complete daily activities?: Yes Is the patient deaf or have difficulty hearing?: No Does the patient have difficulty seeing, even when wearing glasses/contacts?: No Does the patient have difficulty concentrating, remembering, or  making decisions?: No Patient able to express need for assistance with ADLs?: Yes Does the patient have difficulty dressing or bathing?: No Independently performs ADLs?: No Communication: Independent Dressing (OT): Needs assistance Is this a change from baseline?: Change from baseline, expected to last >3 days Grooming: Needs assistance Is this a change from baseline?: Change from baseline, expected to last <3 days Feeding: Independent Bathing: Needs assistance Is this a change from baseline?: Change from baseline, expected to last >3 days Toileting: Needs assistance Is this a change from baseline?: Change from baseline, expected to last >3days In/Out Bed: Needs assistance Is this a change from baseline?: Change from baseline, expected to last >3 days Walks in Home: Needs assistance Does the patient have difficulty walking or climbing stairs?: Yes Weakness of Legs: None Weakness of Arms/Hands: None  Permission Sought/Granted                  Emotional Assessment     Affect (typically observed): Appropriate Orientation: : Oriented to Self, Oriented to  Time, Oriented to Place, Oriented to Situation Alcohol / Substance Use: Not Applicable Psych Involvement: No (comment)  Admission diagnosis:  Closed fracture of neck of right femur, initial encounter ([place]) [S72.001A] Closed displaced fracture of right femoral neck ([place]) [S72.001A] Patient Active Problem List   Diagnosis Date Noted   Thrombocytopenia ([place]) 08/10/2022   GERD (gastroesophageal reflux disease) 08/10/2022   Vitamin D deficiency 08/10/2022   Right  leg swelling 08/10/2022   History of CVA (cerebrovascular accident) 08/10/2022   Closed displaced fracture of right femoral neck ([place]) 08/09/2022   Contusion of hip, left 10/22/2020   Metacarpal bone fracture 10/01/2020   Breast cancer, right ([place]) 07/13/2018   Malignant neoplasm of upper-outer quadrant of right breast in female, estrogen receptor positive ([place])  06/15/2018   Fall down stairs 07/05/2016   Long-term (current) use of anticoagulants 07/05/2016   Closed fracture of distal phalanx of digit of left hand 07/05/2016   Pulmonary contusion 07/05/2016   Traumatic pneumothorax 07/05/2016   Acute blood loss anemia 07/05/2016   Multiple fractures of ribs of right side 07/02/2016   Musculoskeletal arm pain 01/03/2014   Hoarseness, persistent 01/03/2014   Pacemaker 10/24/2013   Atrial fibrillation ([place]) 10/24/2013   Syncope 07/25/2013   Stroke ([place]) 07/16/2013   Acute right MCA stroke ([place]) 07/16/2013   Acute left hemiparesis ([place]) 07/16/2013   Essential hypertension 07/16/2013   Mixed hyperlipidemia 07/16/2013   Dysarthria as late effect of stroke 07/16/2013   Acquired hypothyroidism 07/16/2013   Encounter for long-term (current) use of medications 07/16/2013   PCP:  Monico Blitz, MD Pharmacy:   [facility] [place] [facility] [place] [address] Phone: [phone] Fax: [phone]  [id number] [address] [facility]Phone: [phone]: [phone]    Social Determinants of Health (SDOH) Interventions    Readmission Risk Interventions     No data to display

## 2022-08-10 NOTE — Consult Note (Signed)
ORTHOPAEDIC CONSULTATION  REQUESTING PHYSICIAN: Kathie Dike, MD  ASSESSMENT AND PLAN: 88 y.o. female with the following: Right Hip Non displaced femoral neck fracture  This patient requires inpatient admission to the hospitalist, to include preoperative clearance and perioperative medical management  - Weight Bearing Status/Activity: NWB Right lower extremity  - Additional recommended labs/tests: Preop Labs: CBC, BMP, PT/INR, Chest XR, and EKG  -VTE Prophylaxis: Please hold prior to OR; to resume POD#1 at the discretion of the primary team  - Pain control: Recommend PO pain medications PRN; judicious use of narcotics  - Follow-up plan: F/u 10-14 days postop  -Procedures: Plan for OR once patient has been medically optimized  Plan for Right Hip CRPP  Plan to proceed with surgery 08/11/2022.  Please make sure that she is n.p.o. in preparation for surgery.     Chief Complaint: Right hip pain  HPI: Jessica Myers is a 88 y.o. female who presented to the ED for evaluation after sustaining a mechanical fall.  She was in her basement, when she caught her foot on a rug.  She lost her balance.  She landed on her right side.  She had immediate pain.  She was able to stand up, and had to walk to the bottom of the stairs.  However, she could not get up stairs.  She called her husband, who contacted EMS.  She states that she has limited pain in the right hip now, unless we start to move it.  No numbness or tingling distally.  She is on Eliquis, with her last dose more than 24 hours ago.  Past Medical History:  Diagnosis Date   A-fib [facility])    Anxiety    Bradycardia    s/p PPM October 2014   Chronic anticoagulation    Dysrhythmia    Headache(784.0)    High cholesterol    Hypertension    Malignant neoplasm of upper-outer quadrant of right breast in female, estrogen receptor positive ([place]) 06/15/2018   Multiple rib fractures 07/01/2016   fell down stairs    Pulmonary contusion  06/2016   from a fall    Stroke [facility]) September 2014   thrombolytic therapy   Thyroid disease    Vertical vertigo    Past Surgical History:  Procedure Laterality Date   BREAST LUMPECTOMY WITH RADIOACTIVE SEED LOCALIZATION Right 07/13/2018   Procedure: BREAST LUMPECTOMY WITH RADIOACTIVE SEED LOCALIZATION;  Surgeon: Rolm Bookbinder, MD;  Location: [place];  Service: General;  Laterality: Right;   CHOLECYSTECTOMY     INSERT / REPLACE / [place] N/A 07/25/2013   Procedure: PERMANENT PACEMAKER INSERTION;  Surgeon: Deboraha Sprang, MD;  Location: [facility] CATH LAB;  Service: Cardiovascular;  Laterality: N/A;   TEE WITHOUT CARDIOVERSION N/A 07/16/2013   Procedure: TRANSESOPHAGEAL ECHOCARDIOGRAM (TEE);  Surgeon: Thayer Headings, MD;  Location: [facility] ENDOSCOPY;  Service: Cardiovascular;  Laterality: N/A;   Social History   Socioeconomic History   Marital status: Married    Spouse name: leroy   Number of children: 2   Years of education: 12   Highest education level: Not on file  Occupational History   Occupation: retired  Tobacco Use   Smoking status: Never   Smokeless tobacco: Never  Vaping Use   Vaping Use: Never used  Substance and Sexual Activity   Alcohol use: No    Alcohol/week: 0.0 standard drinks of alcohol   Drug use: No  Sexual activity: Never  Other Topics Concern   Not on file  Social History Narrative   Patient lives at home with her husband.   Social Determinants of Health   Financial Resource Strain: Not on file  Food Insecurity: No Food Insecurity (08/10/2022)   Hunger Vital Sign    Worried About Running Out of Food in the Last Year: Never true    Ran Out of Food in the Last Year: Never true  Transportation Needs: No Transportation Needs (08/10/2022)   PRAPARE - Hydrologist (Medical): No    Lack of Transportation (Non-Medical): No  Physical Activity: Not on file  Stress:  Not on file  Social Connections: Not on file   Family History  Problem Relation Age of Onset   Cancer Mother    Breast cancer Mother    Cancer Father    Lung cancer Father    Allergies  Allergen Reactions   Biaxin [Clarithromycin] Shortness Of Breath and Rash    Unknown   Penicillins Hives    Has patient had a PCN reaction causing immediate rash, facial/tongue/throat swelling, SOB or lightheadedness with hypotension: Yes Has patient had a PCN reaction causing severe rash involving mucus membranes or skin necrosis: No Has patient had a PCN reaction that required hospitalization No Has patient had a PCN reaction occurring within the last 10 years: No If all of the above answers are "NO", then may proceed with Cephalosporin use.     Shellfish Allergy Nausea And Vomiting   Sulfa Antibiotics Other (See Comments)    "bad reaction," per patient   Celebrex [Celecoxib] Rash   Prior to Admission medications   Medication Sig Start Date End Date Taking? Authorizing Provider  acetaminophen (TYLENOL) 500 MG tablet Take 500-1,000 mg by mouth every 6 (six) hours as needed for mild pain.   Yes [provider]  atorvastatin (LIPITOR) 10 MG tablet Take 10 mg by mouth every evening.    Yes [provider]  calcium carbonate (OSCAL) 1500 (600 Ca) MG TABS tablet Take by mouth 2 (two) times daily with a meal.   Yes [provider]  cholecalciferol (VITAMIN D) 1000 UNITS tablet Take 1,000 Units by mouth daily.    Yes [provider]  ELIQUIS 5 MG TABS tablet TAKE 1 TABLET BY MOUTH TWICE A DAY 11/13/19  Yes Evans Lance, MD  flecainide (TAMBOCOR) 150 MG tablet TAKE HALF A TABLET (75 MG TOTAL) BY MOUTH 2 (TWO) TIMES DAILY. 08/05/22  Yes Evans Lance, MD  KRILL OIL PO Take by mouth.   Yes [provider]  levothyroxine (SYNTHROID, LEVOTHROID) 112 MCG tablet Take 112 mcg by mouth daily before breakfast. 06/18/18  Yes [provider]  lisinopril  (ZESTRIL) 20 MG tablet Take 1 tablet (20 mg total) by mouth 2 (two) times daily. 04/28/22  Yes Nicholas Lose, MD  meclizine (ANTIVERT) 25 MG tablet Take 25 mg by mouth 3 (three) times daily as needed for dizziness.   Yes [provider]  metoprolol succinate (TOPROL XL) 25 MG 24 hr tablet Take 2-3 Times Daily as Directed. 01/04/22  Yes Evans Lance, MD  Multiple Vitamins-Minerals (PRESERVISION AREDS PO) Take 1 tablet by mouth daily.   Yes [provider]  omeprazole (PRILOSEC) 20 MG capsule Take 20 mg by mouth at bedtime.    Yes [provider]  Polyethyl Glycol-Propyl Glycol 0.4-0.3 % SOLN Place 1 drop into both eyes 3 (three) times daily as  needed (for dry eyes.).   Yes [provider]  polyethylene glycol (MIRALAX / GLYCOLAX) packet Take 17 g by mouth daily as needed (for constipation.).    Yes [provider]  simethicone (MYLICON) 277 MG chewable tablet Chew 125-250 mg by mouth every 6 (six) hours as needed for flatulence.   Yes [provider]   US Venous Img Lower Unilateral Right (DVT)  Result Date: 08/10/2022 CLINICAL DATA:  Right leg swelling EXAM: Right LOWER EXTREMITY VENOUS DOPPLER ULTRASOUND TECHNIQUE: Gray-scale sonography with compression, as well as color and duplex ultrasound, were performed to evaluate the deep venous system(s) from the level of the common femoral vein through the popliteal and proximal calf veins. COMPARISON:  None Available. FINDINGS: VENOUS Normal compressibility of the common femoral, superficial femoral, and popliteal veins, as well as the visualized calf veins. Visualized portions of profunda femoral vein and great saphenous vein unremarkable. No filling defects to suggest DVT on grayscale or color Doppler imaging. Doppler waveforms show normal direction of venous flow, normal respiratory plasticity and response to augmentation. Limited views of the contralateral common femoral vein are unremarkable. OTHER  None. Limitations: none IMPRESSION: No evidence of DVT in the right lower extremity. Electronically Signed   By: Marin Roberts M.D.   On: 08/10/2022 11:28   CT PELVIS WO CONTRAST  Result Date: 08/09/2022 CLINICAL DATA:  Hip trauma with fracture suspected. X-ray suggested subcapital irregularity and possible sacral ala fracture. Fell to the right hip with pain. Trip and fall injury. EXAM: CT PELVIS WITHOUT CONTRAST TECHNIQUE: Multidetector CT imaging of the pelvis was performed following the standard protocol without intravenous contrast. RADIATION DOSE REDUCTION: This exam was performed according to the departmental dose-optimization program which includes automated exposure control, adjustment of the mA and/or kV according to patient size and/or use of iterative reconstruction technique. COMPARISON:  Right hip radiographs 1024 23. CT abdomen and pelvis 07/02/2016 FINDINGS: Urinary Tract: Bladder is unremarkable. There is a cyst in the right mid abdomen, incompletely included but likely representing a lower pole renal cyst and measuring 5.7 cm in diameter. No change since prior study. No imaging follow-up is indicated. Bowel: Visualized portions of small and large bowel are not abnormally distended. No wall thickening is appreciated. The visualized colon is diffusely stool-filled. The appendix is normal. Vascular/Lymphatic: Calcification of the lower aorta and iliac arteries. No aneurysm. Reproductive:  No mass or other significant abnormality Other: No free air or free fluid demonstrated. Abdominal wall musculature appears intact. Musculoskeletal: There is an acute subcapital fracture of the right femoral neck with mild impaction of fracture fragments and posterior angulation of the distal fracture fragments. No dislocation at the hip joint. The sacrum appears intact. No acute displaced fractures are identified to involve the sacrum, pelvis, or left hip. Degenerative changes in the lower lumbar spine and hips.  IMPRESSION: 1. Acute impacted fracture of the right femoral neck. 2. No additional acute fractures are identified. 3. Degenerative changes in the lower lumbar spine and hips. Electronically Signed   By: Lucienne Capers M.D.   On: 08/09/2022 21:29   DG Hip Unilat  With Pelvis 2-3 Views Right  Result Date: 08/09/2022 CLINICAL DATA:  Fall at home today with right hip pain. EXAM: DG HIP (WITH OR WITHOUT PELVIS) 2-3V RIGHT COMPARISON:  None Available. FINDINGS: There is a cortical irregularity in the subcapital region of the proximal right femur suggesting impaction fracture. No dislocation. Cortical irregularity is also noted at the sacral ala on the right, possible  fracture. Mild degenerative changes are noted at the hips bilaterally in the lower lumbar spine. IMPRESSION: 1. Findings suggestive of subcapital femoral fracture on the right. 2. Cortical irregularity in the sacral ala on the right, possible fracture. CT pelvis is recommended for further evaluation. Electronically Signed   By: Brett Fairy M.D.   On: 08/09/2022 20:03   Family History Reviewed and non-contributory, no pertinent history of problems with bleeding or anesthesia    Review of Systems No fevers or chills No numbness or tingling No chest pain No shortness of breath No bowel or bladder dysfunction No GI distress No headaches    OBJECTIVE  Vitals:Patient Vitals for the past 8 hrs:  BP Temp Temp src Pulse Resp SpO2  08/10/22 1300 (!) 102/50 98 F (36.7 C) Oral 62 18 --  08/10/22 0827 (!) 103/52 97.7 F (36.5 C) Oral 61 20 94 %  08/10/22 0754 (!) 102/49 98.1 F (36.7 C) Oral 60 18 93 %   General: Alert, no acute distress Cardiovascular: Extremities are warm Respiratory: No cyanosis, no use of accessory musculature Skin: No lesions in the area of chief complaint  Neurologic: Sensation intact distally  Psychiatric: Patient is competent for consent with normal mood and affect Lymphatic: No swelling obvious and  reported other than the area involved in the exam below Extremities  RLE: Extremity held in a fixed position.  ROM deferred due to known fracture.  Sensation is intact distally in the sural, saphenous, DP, SP, and plantar nerve distribution. 2+ DP pulse.  Toes are WWP.  Active motion intact in the TA/EHL/GS. LLE: Sensation is intact distally in the sural, saphenous, DP, SP, and plantar nerve distribution. 2+ DP pulse.  Toes are WWP.  Active motion intact in the TA/EHL/GS. Tolerates gentle ROM of the hip.  No pain with axial loading.     Test Results Imaging XR and CT of the Right hip demonstrates a Non displaced femoral neck fracture.  Labs cbc Recent Labs    08/09/22 2200 08/10/22 0432  WBC 9.9 7.8  HGB 12.2 11.7*  HCT 36.9 36.1  PLT 135* 126*      Recent Labs    08/09/22 2200 08/10/22 0432  NA 137 138  K 4.2 4.0  CL 105 106  CO2 23 25  GLUCOSE 104* 95  BUN 24* 23  CREATININE 1.11* 1.09*  CALCIUM 9.3 9.2

## 2022-08-11 ENCOUNTER — Inpatient Hospital Stay (HOSPITAL_COMMUNITY): Payer: PPO

## 2022-08-11 ENCOUNTER — Other Ambulatory Visit: Payer: Self-pay

## 2022-08-11 ENCOUNTER — Encounter (HOSPITAL_COMMUNITY)
Admission: EM | Disposition: A | Discharge status: Skilled Nursing Facility | Payer: Self-pay | Source: Home / Self Care | Attending: Internal Medicine

## 2022-08-11 ENCOUNTER — Encounter (HOSPITAL_COMMUNITY): Payer: Self-pay | Admitting: Internal Medicine

## 2022-08-11 DIAGNOSIS — E039 Hypothyroidism, unspecified: Secondary | ICD-10-CM | POA: Diagnosis not present

## 2022-08-11 DIAGNOSIS — I4891 Unspecified atrial fibrillation: Secondary | ICD-10-CM | POA: Diagnosis not present

## 2022-08-11 DIAGNOSIS — S72001A Fracture of unspecified part of neck of right femur, initial encounter for closed fracture: Secondary | ICD-10-CM | POA: Diagnosis not present

## 2022-08-11 DIAGNOSIS — F419 Anxiety disorder, unspecified: Secondary | ICD-10-CM | POA: Diagnosis not present

## 2022-08-11 DIAGNOSIS — I4819 Other persistent atrial fibrillation: Secondary | ICD-10-CM | POA: Diagnosis not present

## 2022-08-11 DIAGNOSIS — I10 Essential (primary) hypertension: Secondary | ICD-10-CM | POA: Diagnosis not present

## 2022-08-11 HISTORY — PX: ORIF HIP FRACTURE: SHX2125

## 2022-08-11 LAB — PROTIME-INR
INR: 1.3 — ABNORMAL HIGH (ref 0.8–1.2)
Prothrombin Time: 16.1 seconds — ABNORMAL HIGH (ref 11.4–15.2)

## 2022-08-11 SURGERY — OPEN REDUCTION INTERNAL FIXATION HIP
Anesthesia: General | Site: Hip | Laterality: Right

## 2022-08-11 MED ORDER — DEXMEDETOMIDINE HCL IN NACL 80 MCG/20ML IV SOLN
INTRAVENOUS | Status: AC
  Filled 2022-08-11: qty 40

## 2022-08-11 MED ORDER — CHLORHEXIDINE GLUCONATE 0.12 % MT SOLN
OROMUCOSAL | Status: AC
  Filled 2022-08-11: qty 15

## 2022-08-11 MED ORDER — ONDANSETRON HCL 4 MG/2ML IJ SOLN
INTRAMUSCULAR | Status: AC
  Filled 2022-08-11: qty 2

## 2022-08-11 MED ORDER — EPHEDRINE 5 MG/ML INJ
INTRAVENOUS | Status: AC
  Filled 2022-08-11: qty 5

## 2022-08-11 MED ORDER — PROPOFOL 10 MG/ML IV BOLUS
INTRAVENOUS | Status: AC
  Filled 2022-08-11: qty 20

## 2022-08-11 MED ORDER — SODIUM CHLORIDE 0.9 % IR SOLN
Status: DC | PRN
  Administered 2022-08-11: 1000 mL

## 2022-08-11 MED ORDER — PHENYLEPHRINE 80 MCG/ML (10ML) SYRINGE FOR IV PUSH (FOR BLOOD PRESSURE SUPPORT)
PREFILLED_SYRINGE | INTRAVENOUS | Status: AC
  Filled 2022-08-11: qty 20

## 2022-08-11 MED ORDER — SUGAMMADEX SODIUM 500 MG/5ML IV SOLN
INTRAVENOUS | Status: AC
  Filled 2022-08-11: qty 5

## 2022-08-11 MED ORDER — FENTANYL CITRATE (PF) 100 MCG/2ML IJ SOLN
INTRAMUSCULAR | Status: AC
  Filled 2022-08-11: qty 2

## 2022-08-11 MED ORDER — LIDOCAINE HCL (PF) 2 % IJ SOLN
INTRAMUSCULAR | Status: AC
  Filled 2022-08-11: qty 5

## 2022-08-11 MED ORDER — STERILE WATER FOR IRRIGATION IR SOLN
Status: DC | PRN
  Administered 2022-08-11: 1000 mL

## 2022-08-11 MED ORDER — ONDANSETRON HCL 4 MG/2ML IJ SOLN: 4.0000 mg | Freq: Once | INTRAMUSCULAR | Status: DC | PRN

## 2022-08-11 MED ORDER — FENTANYL CITRATE (PF) 100 MCG/2ML IJ SOLN
INTRAMUSCULAR | Status: DC | PRN
  Administered 2022-08-11 (×2): 25 ug via INTRAVENOUS

## 2022-08-11 MED ORDER — ONDANSETRON HCL 4 MG/2ML IJ SOLN
INTRAMUSCULAR | Status: DC | PRN
  Administered 2022-08-11: 4 mg via INTRAVENOUS

## 2022-08-11 MED ORDER — CHLORHEXIDINE GLUCONATE 0.12 % MT SOLN
15.0000 mL | Freq: Once | OROMUCOSAL | Status: DC
  Administered 2022-08-11: 15 mL via OROMUCOSAL

## 2022-08-11 MED ORDER — PHENYLEPHRINE HCL (PRESSORS) 10 MG/ML IV SOLN
INTRAVENOUS | Status: DC | PRN
  Administered 2022-08-11 (×8): 80 ug via INTRAVENOUS

## 2022-08-11 MED ORDER — ORAL CARE MOUTH RINSE: 15.0000 mL | Freq: Once | OROMUCOSAL | Status: DC

## 2022-08-11 MED ORDER — DEXAMETHASONE SODIUM PHOSPHATE 10 MG/ML IJ SOLN
INTRAMUSCULAR | Status: AC
  Filled 2022-08-11: qty 1

## 2022-08-11 MED ORDER — SODIUM CHLORIDE 0.9 % IV SOLN: INTRAVENOUS | Status: DC | PRN

## 2022-08-11 MED ORDER — LACTATED RINGERS IV SOLN: INTRAVENOUS | Status: DC

## 2022-08-11 MED ORDER — OXYCODONE HCL 5 MG/5ML PO SOLN: 5.0000 mg | Freq: Once | ORAL | Status: DC | PRN

## 2022-08-11 MED ORDER — CEFAZOLIN SODIUM-DEXTROSE 2-4 GM/100ML-% IV SOLN
2.0000 g | Freq: Three times a day (TID) | INTRAVENOUS | Status: AC
  Administered 2022-08-11 – 2022-08-12 (×2): 2 g via INTRAVENOUS
  Filled 2022-08-11 (×2): qty 100

## 2022-08-11 MED ORDER — CEFAZOLIN SODIUM-DEXTROSE 2-4 GM/100ML-% IV SOLN
INTRAVENOUS | Status: AC
  Filled 2022-08-11: qty 100

## 2022-08-11 MED ORDER — POLYETHYLENE GLYCOL 3350 17 G PO PACK
17.0000 g | PACK | Freq: Every day | ORAL | Status: DC
  Administered 2022-08-12 – 2022-08-15 (×3): 17 g via ORAL
  Filled 2022-08-11 (×6): qty 1

## 2022-08-11 MED ORDER — FENTANYL CITRATE PF 50 MCG/ML IJ SOSY: 25.0000 ug | PREFILLED_SYRINGE | INTRAMUSCULAR | Status: DC | PRN

## 2022-08-11 MED ORDER — BUPIVACAINE-EPINEPHRINE (PF) 0.5% -1:200000 IJ SOLN
INTRAMUSCULAR | Status: AC
  Filled 2022-08-11: qty 30

## 2022-08-11 MED ORDER — PROPOFOL 10 MG/ML IV BOLUS
INTRAVENOUS | Status: DC | PRN
  Administered 2022-08-11: 50 mg via INTRAVENOUS

## 2022-08-11 MED ORDER — LIDOCAINE HCL (CARDIAC) PF 100 MG/5ML IV SOSY
PREFILLED_SYRINGE | INTRAVENOUS | Status: DC | PRN
  Administered 2022-08-11: 40 mg via INTRAVENOUS

## 2022-08-11 MED ORDER — OXYCODONE HCL 5 MG PO TABS: 5.0000 mg | ORAL_TABLET | Freq: Once | ORAL | Status: DC | PRN

## 2022-08-11 MED ORDER — LACTATED RINGERS IV SOLN: INTRAVENOUS | Status: DC | PRN

## 2022-08-11 MED ORDER — DEXAMETHASONE SODIUM PHOSPHATE 4 MG/ML IJ SOLN
INTRAMUSCULAR | Status: DC | PRN
  Administered 2022-08-11: 4 mg via INTRAVENOUS

## 2022-08-11 MED ORDER — OXYCODONE HCL 5 MG PO TABS: 5.0000 mg | ORAL_TABLET | Freq: Four times a day (QID) | ORAL | Status: DC | PRN

## 2022-08-11 MED ORDER — MECLIZINE HCL 12.5 MG PO TABS
25.0000 mg | ORAL_TABLET | Freq: Two times a day (BID) | ORAL | Status: DC | PRN
  Administered 2022-08-11: 25 mg via ORAL
  Filled 2022-08-11: qty 2

## 2022-08-11 MED ORDER — TRANEXAMIC ACID-NACL 1000-0.7 MG/100ML-% IV SOLN
INTRAVENOUS | Status: AC
  Filled 2022-08-11: qty 100

## 2022-08-11 MED ORDER — METOPROLOL SUCCINATE ER 25 MG PO TB24
12.5000 mg | ORAL_TABLET | Freq: Two times a day (BID) | ORAL | Status: DC
  Administered 2022-08-11 – 2022-08-12 (×2): 12.5 mg via ORAL
  Filled 2022-08-11 (×3): qty 1

## 2022-08-11 MED ORDER — BUPIVACAINE-EPINEPHRINE (PF) 0.5% -1:200000 IJ SOLN
INTRAMUSCULAR | Status: DC | PRN
  Administered 2022-08-11: 30 mL via PERINEURAL

## 2022-08-11 SURGICAL SUPPLY — 53 items
APL PRP STRL LF DISP 70% ISPRP (MISCELLANEOUS) ×1
BIT DRILL CANN 5.0 (BIT) IMPLANT
BLADE SURG SZ10 CARB STEEL (BLADE) ×2 IMPLANT
BNDG GAUZE ELAST 4 BULKY (GAUZE/BANDAGES/DRESSINGS) ×1 IMPLANT
CHLORAPREP W/TINT 26 (MISCELLANEOUS) ×1 IMPLANT
CLOTH BEACON ORANGE TIMEOUT ST (SAFETY) ×1 IMPLANT
COVER LIGHT HANDLE STERIS (MISCELLANEOUS) ×4 IMPLANT
COVER MAYO STAND XLG (MISCELLANEOUS) ×1 IMPLANT
COVER PERINEAL POST (MISCELLANEOUS) ×1 IMPLANT
DECANTER SPIKE VIAL GLASS SM (MISCELLANEOUS) ×1 IMPLANT
DRAPE STERI IOBAN 125X83 (DRAPES) ×1 IMPLANT
DRSG MEPILEX SACRM 8.7X9.8 (GAUZE/BANDAGES/DRESSINGS) ×1 IMPLANT
DRSG TEGADERM 4X4.75 (GAUZE/BANDAGES/DRESSINGS) IMPLANT
ELECT REM PT RETURN 9FT ADLT (ELECTROSURGICAL) ×1
ELECTRODE REM PT RTRN 9FT ADLT (ELECTROSURGICAL) ×1 IMPLANT
GAUZE SPONGE 4X4 12PLY STRL (GAUZE/BANDAGES/DRESSINGS) IMPLANT
GLOVE BIO SURGEON STRL SZ7 (GLOVE) IMPLANT
GLOVE BIO SURGEON STRL SZ8 (GLOVE) ×3 IMPLANT
GLOVE BIOGEL PI IND STRL 7.0 (GLOVE) ×2 IMPLANT
GLOVE SRG 8 PF TXTR STRL LF DI (GLOVE) ×1 IMPLANT
GLOVE SURG UNDER POLY LF SZ8 (GLOVE) ×1
GOWN STRL REUS W/ TWL XL LVL3 (GOWN DISPOSABLE) ×1 IMPLANT
GOWN STRL REUS W/TWL LRG LVL3 (GOWN DISPOSABLE) ×2 IMPLANT
GOWN STRL REUS W/TWL XL LVL3 (GOWN DISPOSABLE) ×1
INST SET MAJOR BONE (KITS) ×1 IMPLANT
KIT BLADEGUARD II DBL (SET/KITS/TRAYS/PACK) ×1 IMPLANT
KIT TURNOVER CYSTO (KITS) ×1 IMPLANT
MANIFOLD NEPTUNE II (INSTRUMENTS) ×1 IMPLANT
MARKER SKIN DUAL TIP RULER LAB (MISCELLANEOUS) ×1 IMPLANT
NDL HYPO 21X1.5 SAFETY (NEEDLE) ×1 IMPLANT
NDL SPNL 18GX3.5 QUINCKE PK (NEEDLE) ×1 IMPLANT
NEEDLE HYPO 21X1.5 SAFETY (NEEDLE) ×1 IMPLANT
NEEDLE SPNL 18GX3.5 QUINCKE PK (NEEDLE) ×1 IMPLANT
NS IRRIG 1000ML POUR BTL (IV SOLUTION) ×1 IMPLANT
PACK BASIC III (CUSTOM PROCEDURE TRAY) ×1
PACK SRG BSC III STRL LF ECLPS (CUSTOM PROCEDURE TRAY) ×1 IMPLANT
PAD ABD 5X9 TENDERSORB (GAUZE/BANDAGES/DRESSINGS) IMPLANT
PENCIL SMOKE EVACUATOR COATED (MISCELLANEOUS) ×1 IMPLANT
PIN GUIDE THRD AR 3.2X330 (PIN) IMPLANT
SCREW CANN 2.0X85 (Screw) IMPLANT
SCREW CANN 70X20X7 NS (Screw) IMPLANT
SCREW CANN 7X80 (Screw) IMPLANT
SCREW CANN THRD 7X70 (Screw) ×1 IMPLANT
SET BASIN LINEN APH (SET/KITS/TRAYS/PACK) ×1 IMPLANT
SPONGE T-LAP 18X18 ~~LOC~~+RFID (SPONGE) ×2 IMPLANT
STRIP CLOSURE SKIN 1/2X4 (GAUZE/BANDAGES/DRESSINGS) IMPLANT
SUT MNCRL AB 4-0 PS2 18 (SUTURE) IMPLANT
SUT MON AB 2-0 CT1 36 (SUTURE) ×1 IMPLANT
SYR 30ML LL (SYRINGE) ×1 IMPLANT
SYR BULB IRRIG 60ML STRL (SYRINGE) ×2 IMPLANT
WASHER FLAT 7.0 (Washer) IMPLANT
YANKAUER SUCT 12FT TUBE ARGYLE (SUCTIONS) ×1 IMPLANT
YANKAUER SUCT BULB TIP NO VENT (SUCTIONS) IMPLANT

## 2022-08-11 NOTE — Op Note (Signed)
Orthopaedic Surgery Operative Note (CSN: [id number])  Jessica Myers  07/14/1929 Date of Surgery: 08/11/2022   Diagnoses:  Non displaced right femoral neck fracture  Procedure: CRPP of the Right femoral neck fracture   Operative Finding Successful completion of the planned procedure.  Placement of 3 x 7.0 mm cannulated screws with washers to stabilize the Non displaced femoral neck fracture   Post-Op Diagnosis: Same Surgeons:Primary: Mordecai Rasmussen, MD Assistants:  None Location: [facility] OR ROOM 4 Anesthesia: General with local anesthesia Antibiotics: Ancef 2 g Tourniquet time: N/A Estimated Blood Loss: 20 cc Complications: None Specimens: None Implants: Implant Name Type Inv. Item Serial No. Manufacturer Lot No. LRB No. Used Action  WASHER FLAT 7.0 - [id number] Washer WASHER FLAT 7.0  [employer] STERILE ON SET Right 3 Implanted  SCREW CANN 2.0X85 - [id number] Screw SCREW CANN 2.0X85  [employer] STERILE ON SET Right 1 Implanted  SCREW CANN THRD 7X70 - [id number] Screw SCREW CANN THRD 7X70  [employer] STERILE ON SET Right 1 Implanted  SCREW CANN 7X80 - [id number] Screw SCREW CANN 7X80  [employer] STERILE ON SET Right 1 Implanted    Indications for Surgery:   Jessica Myers is a 88 y.o. female who suffered a mechanical fall and sustained a right nondisplaced femoral neck fracture.  In order to restore form and function, we recommended proceeding with surgery.  Benefits and risks of operative and nonoperative management were discussed prior to surgery with the patient and informed consent form was completed.  Specific risks including infection, need for additional surgery, loss of reduction, malunion, nonunion, bleeding, persistent pain, damage to surrounding structures, AVN and more severe complications associated with anesthesia were discussed.  All questions were answered.  Consent was finalized.    Procedure:   The patient was identified properly. Informed consent was obtained  and the surgical site was marked. The patient was taken to the OR where general anesthesia was induced.  The patient was positioned supine on a fracture table.  The right leg was prepped and draped in the usual sterile fashion.  Timeout was performed before the beginning of the case.  We started by making a lateral incision over the hip, in line with the starting point for the screws.  We incised sharply through the IT band to expose the lateral border of the femur.  Under fluoroscopic guidance, we placed the first guide wire along the inferior aspect of the femoral neck.  We ensured that the starting point was superior to the lesser trochanter so as to reduce the risk of a stress riser.  Orthogonal images confirmed the placement of the first guidewire to be inferior and posterior within the femoral neck.  We then placed the targeting device and placed 2 additional guidewires in the superior and anterior and then superior and posterior femoral neck. We used fluoroscopy we were satisfied with the positioning of the guidewires.  Using the measuring device, we determined the length of screw needed.  We then proceeded to drill the lateral cortex.  All 3 screws with a washer were then inserted under fluoroscopic guidance.  Orthogonal imaging confirmed the location of the screws of sufficient length and the washers were flush with the lateral cortex.   We irrigated the wound copiously and then closed the incision in a multilayer fashion with absorbable suture.  Sterile dressing was placed.  Patient was awoken taken to PACU in stable condition.   Post-operative plan:  The patient will be returned to  the floor.   Weightbearing status:  WBAT RLE Dressing to remain in place until POD#2/3 DVT prophylaxis per primary team, no orthopedic contraindications.    Pain control with PRN pain medication preferring oral medicines.   Follow up plan will be scheduled in approximately 10-14 days days for incision check and XR  of the Right hip.

## 2022-08-11 NOTE — Anesthesia Preprocedure Evaluation (Signed)
Anesthesia Evaluation  Patient identified by MRN, date of birth, ID band Patient awake    Reviewed: Allergy & Precautions, H&P , NPO status , Patient's Chart, lab work & pertinent test results, reviewed documented beta blocker date and time   Airway Mallampati: II  TM Distance: >3 FB Neck ROM: full    Dental no notable dental hx.    Pulmonary neg pulmonary ROS,    Pulmonary exam normal breath sounds clear to auscultation       Cardiovascular Exercise Tolerance: Good hypertension, + dysrhythmias Atrial Fibrillation + pacemaker  Rhythm:irregular Rate:Normal     Neuro/Psych  Headaches, PSYCHIATRIC DISORDERS Anxiety CVA, Residual Symptoms    GI/Hepatic Neg liver ROS, GERD  Medicated,  Endo/Other  Hypothyroidism   Renal/GU negative Renal ROS  negative genitourinary   Musculoskeletal   Abdominal   Peds  Hematology  (+) Blood dyscrasia, anemia ,   Anesthesia Other Findings   Reproductive/Obstetrics negative OB ROS                             Anesthesia Physical Anesthesia Plan  ASA: 3  Anesthesia Plan: General and General LMA   Post-op Pain Management:    Induction:   PONV Risk Score and Plan: Ondansetron  Airway Management Planned:   Additional Equipment:   Intra-op Plan:   Post-operative Plan:   Informed Consent: I have reviewed the patients History and Physical, chart, labs and discussed the procedure including the risks, benefits and alternatives for the proposed anesthesia with the patient or authorized representative who has indicated his/her understanding and acceptance.     Dental Advisory Given  Plan Discussed with: CRNA  Anesthesia Plan Comments:         Anesthesia Quick Evaluation

## 2022-08-11 NOTE — Progress Notes (Signed)
Pt has two episodes of exiting bed and reported that she was getting her clothes on to go to surgery. She was informed of time and redirected back to bed. Patient answered all neuro questions appropriately. She c/o R Hip pain and PRN Tylenol was given. VS stable.No acute events overnight. Bryson Corona Edd Fabian

## 2022-08-11 NOTE — Progress Notes (Signed)
PROGRESS NOTE    Jessica Myers  SEG:[id number] DOB: [birth year] DOA: 08/09/2022 PCP: Monico Blitz, MD    Brief Narrative:  88 year old female who unfortunately suffered a fall at home after she tripped on her rug, admitted to the hospital with right hip fracture.  She has a history of paroxysmal atrial fibrillation on anticoagulation.  Eliquis was held on admission for anticipated surgery.  Heart rate is currently in sinus rhythm.  She was seen by orthopedics and plan is for operative management on 10/26.   Assessment & Plan:   Principal Problem:   Closed displaced fracture of right femoral neck (HCC) Active Problems:   Essential hypertension   Mixed hyperlipidemia   Acquired hypothyroidism   Atrial fibrillation (HCC)   Thrombocytopenia (HCC)   GERD (gastroesophageal reflux disease)   Vitamin D deficiency   Right leg swelling   History of CVA (cerebrovascular accident)   Acute right femoral neck fracture Mechanical fall -Seen by orthopedics -Status post operative management on 10/26 -PT OT postsurgery, will likely need skilled nursing facility placement -Continue pain management  Right lower extremity swelling -Ultrasound negative for DVT -Likely related to fracture  Hyperlipidemia -Continue Lipitor  Paroxysmal atrial fibrillation -Currently in sinus rhythm -She is continued on flecainide -She normally takes Toprol 25 mg twice daily, this was held due to soft blood pressures -Will restart low-dose Toprol 12.5 mg twice daily -Chronically on Eliquis, held for surgery, likely resume 10/27  Hypothyroidism -Continue Synthroid  History of CVA -Continue on Lipitor -Eliquis on hold for surgery, likely resume 10/27    DVT prophylaxis: SCDs Start: 08/10/22 0405, likely resume anticoagulation 10/27  Code Status: Full code Family Communication: Discussed with patient Disposition Plan: Status is: Inpatient Remains inpatient appropriate because: Continue therapy,  will likely need placement     Consultants:  Orthopedics  Procedures:  CRPP of the Right femoral neck fracture  Antimicrobials:      Subjective: Patient is seen in her room postoperatively.  Reports that pain is reasonably controlled, as long as she is not moving her leg. Objective: Vitals:   08/11/22 1250 08/11/22 1300 08/11/22 1327 08/11/22 1400  BP: (!) 121/54 (!) 136/99 (!) 159/75 (!) 134/48  Pulse: (!) 59 61 60 (!) 59  Resp: '14 15 18 16  '$ Temp: 98.5 F (36.9 C)  98.1 F (36.7 C) 98.4 F (36.9 C)  TempSrc:    Oral  SpO2: 95% 92% 93% 93%  Weight:      Height:        Intake/Output Summary (Last 24 hours) at 08/11/2022 1920 Last data filed at 08/11/2022 1253 Gross per 24 hour  Intake 700 ml  Output 650 ml  Net 50 ml   Filed Weights   08/10/22 1432 08/11/22 1039  Weight: 58.5 kg 58 kg    Examination:  General exam: Appears calm and comfortable  Respiratory system: Clear to auscultation. Respiratory effort normal. Cardiovascular system: S1 & S2 heard, RRR. No JVD, murmurs, rubs, gallops or clicks. No pedal edema. Gastrointestinal system: Abdomen is nondistended, soft and nontender. No organomegaly or masses felt. Normal bowel sounds heard. Central nervous system: Alert and oriented. No focal neurological deficits. Extremities: Symmetric 5 x 5 power. Skin: No rashes, lesions or ulcers Psychiatry: Judgement and insight appear normal. Mood & affect appropriate.     Data Reviewed: I have personally reviewed following labs and imaging studies  CBC: Recent Labs  Lab 08/09/22 2200 08/10/22 0432  WBC 9.9 7.8  HGB 12.2 11.7*  HCT 36.9  36.1  MCV 94.6 96.0  PLT 135* 242*   Basic Metabolic Panel: Recent Labs  Lab 08/09/22 2200 08/10/22 0432  NA 137 138  K 4.2 4.0  CL 105 106  CO2 23 25  GLUCOSE 104* 95  BUN 24* 23  CREATININE 1.11* 1.09*  CALCIUM 9.3 9.2  MG  --  1.8  PHOS  --  3.3   GFR: Estimated Creatinine Clearance: 29 mL/min (A) (by C-G  formula based on SCr of 1.09 mg/dL (H)). Liver Function Tests: Recent Labs  Lab 08/10/22 0432  AST 20  ALT 13  ALKPHOS 57  BILITOT 1.6*  PROT 5.8*  ALBUMIN 3.3*   No results for input(s): "LIPASE", "AMYLASE" in the last 168 hours. No results for input(s): "AMMONIA" in the last 168 hours. Coagulation Profile: Recent Labs  Lab 08/11/22 0416  INR 1.3*   Cardiac Enzymes: No results for input(s): "CKTOTAL", "CKMB", "CKMBINDEX", "TROPONINI" in the last 168 hours. BNP (last 3 results) No results for input(s): "PROBNP" in the last 8760 hours. HbA1C: No results for input(s): "HGBA1C" in the last 72 hours. CBG: No results for input(s): "GLUCAP" in the last 168 hours. Lipid Profile: No results for input(s): "CHOL", "HDL", "LDLCALC", "TRIG", "CHOLHDL", "LDLDIRECT" in the last 72 hours. Thyroid Function Tests: No results for input(s): "TSH", "T4TOTAL", "FREET4", "T3FREE", "THYROIDAB" in the last 72 hours. Anemia Panel: No results for input(s): "VITAMINB12", "FOLATE", "FERRITIN", "TIBC", "IRON", "RETICCTPCT" in the last 72 hours. Sepsis Labs: No results for input(s): "PROCALCITON", "LATICACIDVEN" in the last 168 hours.  Recent Results (from the past 240 hour(s))  MRSA Next Gen by PCR, Nasal     Status: None   Collection Time: 08/10/22  9:20 PM   Specimen: Nasal Mucosa; Nasal Swab  Result Value Ref Range Status   MRSA by PCR Next Gen NOT DETECTED NOT DETECTED Final    Comment: (NOTE) The GeneXpert MRSA Assay (FDA approved for NASAL specimens only), is one component of a comprehensive MRSA colonization surveillance program. It is not intended to diagnose MRSA infection nor to guide or monitor treatment for MRSA infections. Test performance is not FDA approved in patients less than 9 years old. Performed at [facility], [address], [place], [place] [address]          Radiology Studies: DG C-Arm 1-60 Min  Result Date: 08/11/2022 CLINICAL DATA:  ORIF right hip EXAM: DG  HIP (WITH OR WITHOUT PELVIS) 2-3V RIGHT; DG C-ARM 1-60 MIN COMPARISON:  Right hip radiographs 08/09/2022 FINDINGS: Images were performed intraoperatively without the presence of a radiologist. Interval placement of 3 screws fixating the previously seen subcapital right femoral neck fracture. Total fluoroscopy images: 5 Total fluoroscopy time: 32 seconds Total dose: Radiation Exposure Index (as provided by the fluoroscopic device): 10.931 mGy air Kerma Please see intraoperative findings for further detail. IMPRESSION: Intraoperative images during ORIF of right femoral neck fracture. Electronically Signed   By: Yvonne Kendall M.D.   On: 08/11/2022 13:46   DG HIP UNILAT WITH PELVIS 2-3 VIEWS RIGHT  Result Date: 08/11/2022 CLINICAL DATA:  ORIF right hip EXAM: DG HIP (WITH OR WITHOUT PELVIS) 2-3V RIGHT; DG C-ARM 1-60 MIN COMPARISON:  Right hip radiographs 08/09/2022 FINDINGS: Images were performed intraoperatively without the presence of a radiologist. Interval placement of 3 screws fixating the previously seen subcapital right femoral neck fracture. Total fluoroscopy images: 5 Total fluoroscopy time: 32 seconds Total dose: Radiation Exposure Index (as provided by the fluoroscopic device): 10.931 mGy air Kerma Please see intraoperative  findings for further detail. IMPRESSION: Intraoperative images during ORIF of right femoral neck fracture. Electronically Signed   By: Yvonne Kendall M.D.   On: 08/11/2022 13:46   DG HIP UNILAT WITH PELVIS 2-3 VIEWS RIGHT  Result Date: 08/11/2022 CLINICAL DATA:  Femoral neck fracture EXAM: DG HIP (WITH OR WITHOUT PELVIS) 3V RIGHT COMPARISON:  None Available. FINDINGS: Screw fixation of right subcapital femoral neck fracture. Expected postsurgical changes of the overlying soft tissues. No evidence of new fracture. IMPRESSION: Postsurgical changes of screw fixation of right subcapital femoral neck fracture. Electronically Signed   By: Yetta Glassman M.D.   On: 08/11/2022 13:15    DG CHEST PORT 1 VIEW  Result Date: 08/10/2022 CLINICAL DATA:  Preoperative assessment. EXAM: PORTABLE CHEST 1 VIEW COMPARISON:  07/03/2016. FINDINGS: Heart is enlarged the mediastinal contour stable. There is atherosclerotic calcification of the aorta. No consolidation, effusion, or pneumothorax. A dual lead pacemaker is present over the left chest. No acute osseous abnormality. IMPRESSION: 1. Cardiomegaly. 2. No active disease. Electronically Signed   By: Brett Fairy M.D.   On: 08/10/2022 20:42   US Venous Img Lower Unilateral Right (DVT)  Result Date: 08/10/2022 CLINICAL DATA:  Right leg swelling EXAM: Right LOWER EXTREMITY VENOUS DOPPLER ULTRASOUND TECHNIQUE: Gray-scale sonography with compression, as well as color and duplex ultrasound, were performed to evaluate the deep venous system(s) from the level of the common femoral vein through the popliteal and proximal calf veins. COMPARISON:  None Available. FINDINGS: VENOUS Normal compressibility of the common femoral, superficial femoral, and popliteal veins, as well as the visualized calf veins. Visualized portions of profunda femoral vein and great saphenous vein unremarkable. No filling defects to suggest DVT on grayscale or color Doppler imaging. Doppler waveforms show normal direction of venous flow, normal respiratory plasticity and response to augmentation. Limited views of the contralateral common femoral vein are unremarkable. OTHER None. Limitations: none IMPRESSION: No evidence of DVT in the right lower extremity. Electronically Signed   By: Marin Roberts M.D.   On: 08/10/2022 11:28   CT PELVIS WO CONTRAST  Result Date: 08/09/2022 CLINICAL DATA:  Hip trauma with fracture suspected. X-ray suggested subcapital irregularity and possible sacral ala fracture. Fell to the right hip with pain. Trip and fall injury. EXAM: CT PELVIS WITHOUT CONTRAST TECHNIQUE: Multidetector CT imaging of the pelvis was performed following the standard protocol  without intravenous contrast. RADIATION DOSE REDUCTION: This exam was performed according to the departmental dose-optimization program which includes automated exposure control, adjustment of the mA and/or kV according to patient size and/or use of iterative reconstruction technique. COMPARISON:  Right hip radiographs 1024 23. CT abdomen and pelvis 07/02/2016 FINDINGS: Urinary Tract: Bladder is unremarkable. There is a cyst in the right mid abdomen, incompletely included but likely representing a lower pole renal cyst and measuring 5.7 cm in diameter. No change since prior study. No imaging follow-up is indicated. Bowel: Visualized portions of small and large bowel are not abnormally distended. No wall thickening is appreciated. The visualized colon is diffusely stool-filled. The appendix is normal. Vascular/Lymphatic: Calcification of the lower aorta and iliac arteries. No aneurysm. Reproductive:  No mass or other significant abnormality Other: No free air or free fluid demonstrated. Abdominal wall musculature appears intact. Musculoskeletal: There is an acute subcapital fracture of the right femoral neck with mild impaction of fracture fragments and posterior angulation of the distal fracture fragments. No dislocation at the hip joint. The sacrum appears intact. No acute displaced fractures are identified to involve  the sacrum, pelvis, or left hip. Degenerative changes in the lower lumbar spine and hips. IMPRESSION: 1. Acute impacted fracture of the right femoral neck. 2. No additional acute fractures are identified. 3. Degenerative changes in the lower lumbar spine and hips. Electronically Signed   By: Lucienne Capers M.D.   On: 08/09/2022 21:29   DG Hip Unilat  With Pelvis 2-3 Views Right  Result Date: 08/09/2022 CLINICAL DATA:  Fall at home today with right hip pain. EXAM: DG HIP (WITH OR WITHOUT PELVIS) 2-3V RIGHT COMPARISON:  None Available. FINDINGS: There is a cortical irregularity in the subcapital  region of the proximal right femur suggesting impaction fracture. No dislocation. Cortical irregularity is also noted at the sacral ala on the right, possible fracture. Mild degenerative changes are noted at the hips bilaterally in the lower lumbar spine. IMPRESSION: 1. Findings suggestive of subcapital femoral fracture on the right. 2. Cortical irregularity in the sacral ala on the right, possible fracture. CT pelvis is recommended for further evaluation. Electronically Signed   By: Brett Fairy M.D.   On: 08/09/2022 20:03        Scheduled Meds:  atorvastatin  10 mg Oral QPM   calcium carbonate  1 tablet Oral BID WC   cholecalciferol  1,000 Units Oral Daily   flecainide  75 mg Oral Q12H   levothyroxine  112 mcg Oral Q0600   metoprolol succinate  12.5 mg Oral BID   pantoprazole  40 mg Oral Daily   Continuous Infusions:  sodium chloride      ceFAZolin (ANCEF) IV       LOS: 2 days    Time spent: 35mns    JKathie Dike MD [facility]   If 7PM-7AM, please contact night-coverage [email or web address]  08/11/2022, 7:20 PM

## 2022-08-11 NOTE — Progress Notes (Signed)
Pt had period of confusion of what time of day it was. She saw 1200 on wall clock and thought it was time for her hip surgery. She was reoriented to 12AM and that her surgery is during the day. She was easily reoriented and assisted back to bed. She has dislodged her peripheral IV so this RN placed a  20G L Lower forearm peripheral.  This RN reiterated that she may not have anything by mouth. She denies further needs at this time. Bryson Corona Edd Fabian

## 2022-08-11 NOTE — Progress Notes (Signed)
   ORTHOPAEDIC PROGRESS NOTE  Scheduled for Right Hip CRPP  DOS: 08/11/22  SUBJECTIVE: Some confusion over night.  Continues to have pain in the right hip.  No other issues.  She has been NPO since midnight.  Consent has been finalized.   OBJECTIVE: PE:  Elderly female.  Alert and oriented, no acute distress  Right hip held in fixed position Toes are warm and well perfused.  Active motion intact in the TA/EHL Sensation is intact to the dorsum of the foot  Vitals:   08/10/22 2104 08/11/22 0500  BP: (!) 121/53 (!) 122/49  Pulse: 60 60  Resp: 17 18  Temp: 98.5 F (36.9 C) 99.4 F (37.4 C)  SpO2: 96% 93%       Latest Ref Rng & Units 08/10/2022    4:32 AM 08/09/2022   10:00 PM 07/06/2018    3:00 PM  CBC  WBC 4.0 - 10.5 K/uL 7.8  9.9  5.9   Hemoglobin 12.0 - 15.0 g/dL 11.7  12.2  12.3   Hematocrit 36.0 - 46.0 % 36.1  36.9  38.7   Platelets 150 - 400 K/uL 126  135  170     ASSESSMENT: Jessica Myers is a 88 y.o. female doing well.  Ready for OR.  NPO since midnight.   PLAN: Weightbearing: NWB RLE Insicional and dressing care: Reinforce dressings as needed; none currently Orthopedic device(s): None VTE prophylaxis: resume Eliquis POD#1 Pain control: PO pain medications as needed; judicious use of narcotics Follow - up plan: 2 weeks   Contact information:     Benjimen Kelley A. Amedeo Kinsman, MD [place] [address] [place],  [place]  [address] Phone: [phone] Fax: [phone]

## 2022-08-11 NOTE — Transfer of Care (Signed)
Immediate Anesthesia Transfer of Care Note  Patient: Jessica Myers  Procedure(s) Performed: OPEN REDUCTION INTERNAL FIXATION HIP (Right: Hip)  Patient Location: PACU  Anesthesia Type:General  Level of Consciousness: drowsy  Airway & Oxygen Therapy: Patient Spontanous Breathing  Post-op Assessment: Report given to RN and Post -op Vital signs reviewed and stable  Post vital signs: Reviewed and stable  Last Vitals:  Vitals Value Taken Time  BP 121/54 08/11/22 1249  Temp 98.5 08/11/22 1249  Pulse 59 08/11/22 1253  Resp 14 08/11/22 1253  SpO2 92 % 08/11/22 1253  Vitals shown include unvalidated device data.  Last Pain:  Vitals:   08/11/22 1039  TempSrc:   PainSc: 0-No pain      Patients Stated Pain Goal: 0 (41/14/64 3142)  Complications: No notable events documented.

## 2022-08-11 NOTE — Anesthesia Procedure Notes (Signed)
Procedure Name: LMA Insertion Date/Time: 08/11/2022 11:41 AM  Performed by: Camillia Herter, RNPre-anesthesia Checklist: Patient identified, Emergency Drugs available, Suction available and Patient being monitored Patient Re-evaluated:Patient Re-evaluated prior to induction Oxygen Delivery Method: Circle system utilized Preoxygenation: Pre-oxygenation with 100% oxygen Induction Type: IV induction Ventilation: Mask ventilation without difficulty LMA: LMA inserted LMA Size: 3.0 Number of attempts: 1 Placement Confirmation: positive ETCO2 and breath sounds checked- equal and bilateral Tube secured with: Tape Dental Injury: Teeth and Oropharynx as per pre-operative assessment

## 2022-08-12 DIAGNOSIS — E039 Hypothyroidism, unspecified: Secondary | ICD-10-CM | POA: Diagnosis not present

## 2022-08-12 DIAGNOSIS — S72001A Fracture of unspecified part of neck of right femur, initial encounter for closed fracture: Secondary | ICD-10-CM | POA: Diagnosis not present

## 2022-08-12 DIAGNOSIS — I10 Essential (primary) hypertension: Secondary | ICD-10-CM | POA: Diagnosis not present

## 2022-08-12 DIAGNOSIS — I4819 Other persistent atrial fibrillation: Secondary | ICD-10-CM | POA: Diagnosis not present

## 2022-08-12 LAB — CBC
HCT: 36.2 % (ref 36.0–46.0)
Hemoglobin: 11.9 g/dL — ABNORMAL LOW (ref 12.0–15.0)
MCH: 31.1 pg (ref 26.0–34.0)
MCHC: 32.9 g/dL (ref 30.0–36.0)
MCV: 94.5 fL (ref 80.0–100.0)
Platelets: 134 10*3/uL — ABNORMAL LOW (ref 150–400)
RBC: 3.83 MIL/uL — ABNORMAL LOW (ref 3.87–5.11)
RDW: 12.1 % (ref 11.5–15.5)
WBC: 10.1 10*3/uL (ref 4.0–10.5)
nRBC: 0 % (ref 0.0–0.2)

## 2022-08-12 LAB — BASIC METABOLIC PANEL
Anion gap: 9 (ref 5–15)
BUN: 21 mg/dL (ref 8–23)
CO2: 25 mmol/L (ref 22–32)
Calcium: 9.1 mg/dL (ref 8.9–10.3)
Chloride: 103 mmol/L (ref 98–111)
Creatinine, Ser: 1.05 mg/dL — ABNORMAL HIGH (ref 0.44–1.00)
GFR, Estimated: 50 mL/min — ABNORMAL LOW (ref >60)
Glucose, Bld: 123 mg/dL — ABNORMAL HIGH (ref 70–99)
Potassium: 4.1 mmol/L (ref 3.5–5.1)
Sodium: 137 mmol/L (ref 135–145)

## 2022-08-12 MED ORDER — APIXABAN 2.5 MG PO TABS
2.5000 mg | ORAL_TABLET | Freq: Two times a day (BID) | ORAL | Status: DC
  Administered 2022-08-13 – 2022-08-15 (×5): 2.5 mg via ORAL
  Filled 2022-08-12 (×5): qty 1

## 2022-08-12 MED ORDER — METOPROLOL SUCCINATE ER 25 MG PO TB24
25.0000 mg | ORAL_TABLET | Freq: Two times a day (BID) | ORAL | Status: DC
  Administered 2022-08-12 – 2022-08-15 (×6): 25 mg via ORAL
  Filled 2022-08-12 (×6): qty 1

## 2022-08-12 MED ORDER — APIXABAN 5 MG PO TABS
5.0000 mg | ORAL_TABLET | Freq: Two times a day (BID) | ORAL | Status: DC
  Administered 2022-08-12: 5 mg via ORAL
  Filled 2022-08-12: qty 1

## 2022-08-12 NOTE — Plan of Care (Signed)
  Problem: Acute Rehab OT Goals (only OT should resolve) Goal: Pt. Will Perform Upper Body Bathing Flowsheets (Taken 08/12/2022 0926) Pt Will Perform Upper Body Bathing: with supervision Goal: Pt. Will Perform Lower Body Dressing Flowsheets (Taken 08/12/2022 0926) Pt Will Perform Lower Body Dressing: with set-up Goal: Pt. Will Transfer To Toilet Flowsheets (Taken 08/12/2022 [phone]) Pt Will Transfer to Toilet: with supervision   Arvil Persons, OTR/L

## 2022-08-12 NOTE — Care Management Important Message (Signed)
Important Message  Patient Details  Name: Jessica Myers MRN: [id number] Date of Birth: [birth year]   Medicare Important Message Given:  Yes     Tommy Medal 08/12/2022, 12:03 PM

## 2022-08-12 NOTE — Evaluation (Signed)
Occupational Therapy Evaluation Patient Details Name: Jessica Myers MRN: [id number] DOB: Aug 08, 1929 Today's Date: 08/12/2022   History of Present Illness Jessica Myers is a 88 y.o. female with medical history significant of hyperlipidemia, hypothyroidism, SSS s/p pacemaker placement, GERD, A-fib on Eliquis who presents emergency department via EMS from home due to sustaining a fall at home.  Patient tripped on a rug and fell landing on right hip, though she was able to get up, she kept limping and the pain worsened throughout the day to the extent that patient cannot bear weight on the right leg anymore.  EMS was activated and patient was sent to the ED for further evaluation and management.  Patient lives at home with husband.   Clinical Impression   Pt was agreeable to OT/PT evaluation. Pt was sitting in recliner chair at start of session. She reports that she is not having any pain when she is just sitting, pain increases with movement but is tolerable. Pt stated that she lives at home with her husband who is able to provide some assist 24/7, he is physically unable to help out of the floor is she falls. Pt was previously independent with al ADLs/IADL's prior to fall. 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 Durign evaluation, she completed bed mobility going from supine to sitting EOB requiring min assist- HHA and assist to manage RLE. She completed sit to stand transfers with use of RW and min/mod assist throughout. Pt was able to complete functional mobility around room with use of RW and min assist. Pt is in agreement that d/c to SNF is safest option to receive rehab before returning home. Discussed the option of HH if d/c home without rehab from SNF, she feels that her husband would be unable to provide the level of assist she might require. Pt would benefit from continued OT services while in the acute care setting.        Recommendations for follow up therapy are one component of a multi-disciplinary discharge planning process, led by the attending physician.  Recommendations may be updated based on patient status, additional functional criteria and insurance authorization.   Follow Up Recommendations  Skilled nursing-short term rehab (<3 hours/day)    Assistance Recommended at Discharge PRN  Patient can return home with the following A little help with walking and/or transfers;A little help with bathing/dressing/bathroom    Functional Status Assessment  Patient has had a recent decline in their functional status and demonstrates the ability to make significant improvements in function in a reasonable and predictable amount of time.  Equipment Recommendations  None recommended by OT    Recommendations for Other Services       Precautions / Restrictions Precautions Precautions: Fall Restrictions Weight Bearing Restrictions: No RLE Weight Bearing: Weight bearing as tolerated      Mobility Bed Mobility Overal bed mobility: Needs Assistance Bed Mobility: Supine to Sit     Supine to sit: Min assist     General bed mobility comments: Min assist and use of HHA to go from supine to sitting EOB, assist to manage RLE    Transfers Overall transfer level: Needs assistance Equipment used: Rolling walker (2 wheels) Transfers: Sit to/from Stand, Bed to chair/wheelchair/BSC Sit to Stand: Min assist, Mod assist Stand pivot transfers: Min assist, Mod assist                Balance Overall balance assessment: Needs assistance  Sitting-balance support: Bilateral upper extremity supported, Feet supported Sitting balance-Leahy Scale: Good     Standing balance support: Bilateral upper extremity supported, Reliant on assistive device for balance Standing balance-Leahy Scale: Fair                             ADL either performed or assessed with clinical judgement   ADL Overall ADL's  : Needs assistance/impaired Eating/Feeding: Set up   Grooming: Set up;Sitting   Upper Body Bathing: Minimal assistance   Lower Body Bathing: Moderate assistance   Upper Body Dressing : Minimal assistance   Lower Body Dressing: Moderate assistance   Toilet Transfer: Minimal assistance   Toileting- Clothing Manipulation and Hygiene: Minimal assistance   Tub/ Shower Transfer: Minimal assistance   Functional mobility during ADLs: Minimal assistance       Vision Baseline Vision/History: 0 No visual deficits Ability to See in Adequate Light: 0 Adequate Patient Visual Report: No change from baseline Vision Assessment?: No apparent visual deficits                Pertinent Vitals/Pain Pain Assessment Pain Assessment: No/denies pain (Pt reporting pain after moblity)     Hand Dominance Right   Extremity/Trunk Assessment Upper Extremity Assessment Upper Extremity Assessment: Overall WFL for tasks assessed   Lower Extremity Assessment Lower Extremity Assessment: Defer to PT evaluation   Cervical / Trunk Assessment Cervical / Trunk Assessment: Normal   Communication Communication Communication: No difficulties   Cognition Arousal/Alertness: Awake/alert Behavior During Therapy: WFL for tasks assessed/performed Overall Cognitive Status: Within Functional Limits for tasks assessed                                                        Home Living Family/patient expects to be discharged to:: Private residence Living Arrangements: Spouse/significant other Available Help at Discharge: Family;Neighbor Type of Home: House Home Access: Stairs to enter [employer] of Steps: 10 (10 STE via front of house, 7 STE via back of house) Entrance Stairs-Rails: Right;Left;Can reach both Home Layout: Two level;Able to live on main level with bedroom/bathroom Alternate Level Stairs-Number of Steps: 14 stairs to basement Alternate Level Stairs-Rails:  Right Bathroom Shower/Tub: Teacher, early years/pre: Standard Bathroom Accessibility: Yes How Accessible: Accessible via walker Home Equipment: Rolling Walker (2 wheels);Shower seat;Grab bars - tub/shower          Prior Functioning/Environment Prior Level of Function : Independent/Modified Independent;Driving             Mobility Comments: 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 Was still driving and completing grocery shopping, able to walk without AD throughout grocery store. ADLs Comments: Independent with all ADLs/IADL's        OT Problem List: Decreased activity tolerance      OT Treatment/Interventions: Self-care/ADL training;DME and/or AE instruction;Therapeutic activities;Patient/family education;Therapeutic exercise    OT Goals(Current goals can be found in the care plan section) Acute Rehab OT Goals Patient Stated Goal: to be strong enough to go home OT Goal Formulation: With patient Time For Goal Achievement: 08/26/22 Potential to Achieve Goals: Good  OT Frequency: Min 1X/week    Co-evaluation PT/OT/SLP Co-Evaluation/Treatment: Yes Reason for Co-Treatment: To address functional/ADL transfers   OT goals addressed during session: ADL's and self-care  AM-PAC OT "6 Clicks" Daily Activity     Outcome Measure Help from another person eating meals?: A Little Help from another person taking care of personal grooming?: A Little Help from another person toileting, which includes using toliet, bedpan, or urinal?: A Little Help from another person bathing (including washing, rinsing, drying)?: A Little Help from another person to put on and taking off regular upper body clothing?: A Little Help from another person to put on and taking off regular lower body clothing?: A Little 6 Click Score: 18   End of Session Equipment Utilized During Treatment: Rolling walker (2 wheels) Nurse Communication: Mobility status  Activity  Tolerance: Patient tolerated treatment well Patient left: in bed;with call bell/phone within reach  OT Visit Diagnosis: Repeated falls (R29.6);Muscle weakness (generalized) (M62.81)                Time: 2426-8341 OT Time Calculation (min): 28 min Charges:  OT General Charges $OT Visit: 1 Visit OT Evaluation $OT Eval Low Complexity: Avery, OTR/L 08/12/2022, 9:19 AM

## 2022-08-12 NOTE — Anesthesia Postprocedure Evaluation (Signed)
Anesthesia Post Note  Patient: Jessica Myers  Procedure(s) Performed: OPEN REDUCTION INTERNAL FIXATION HIP (Right: Hip)  Patient location during evaluation: Phase II Anesthesia Type: General Level of consciousness: awake Pain management: pain level controlled Vital Signs Assessment: post-procedure vital signs reviewed and stable Respiratory status: spontaneous breathing and respiratory function stable Cardiovascular status: blood pressure returned to baseline and stable Postop Assessment: no headache and no apparent nausea or vomiting Anesthetic complications: no Comments: Late entry   No notable events documented.   Last Vitals:  Vitals:   08/12/22 0001 08/12/22 0523  BP: (!) 146/55 (!) 158/80  Pulse: 66 67  Resp: 16 16  Temp: 36.8 C 36.8 C  SpO2: 94% 95%    Last Pain:  Vitals:   08/12/22 0741  TempSrc:   PainSc: 0-No pain                 Louann Sjogren

## 2022-08-12 NOTE — Progress Notes (Signed)
   ORTHOPAEDIC PROGRESS NOTE  S/p Right Hip CRPP  DOS: 08/11/22  SUBJECTIVE: No issues over night.  She is seated in chair at bedside this morning. Pain is controlled.    OBJECTIVE: PE:  Elderly female.  Alert and oriented, no acute distress  Lateral surgical dressing is clean, dry and intact Bruising around the incision Toes are warm and well perfused.  Active motion intact in the TA/EHL Sensation is intact to the dorsum of the foot  Vitals:   08/12/22 0001 08/12/22 0523  BP: (!) 146/55 (!) 158/80  Pulse: 66 67  Resp: 16 16  Temp: 98.2 F (36.8 C) 98.3 F (36.8 C)  SpO2: 94% 95%       Latest Ref Rng & Units 08/12/2022    4:46 AM 08/10/2022    4:32 AM 08/09/2022   10:00 PM  CBC  WBC 4.0 - 10.5 K/uL 10.1  7.8  9.9   Hemoglobin 12.0 - 15.0 g/dL 11.9  11.7  12.2   Hematocrit 36.0 - 46.0 % 36.2  36.1  36.9   Platelets 150 - 400 K/uL 134  126  135     ASSESSMENT: Jessica Myers is a 88 y.o. female doing well.  Stable POD#1.  Waiting to work with PT.  PLAN: Weightbearing: WBAT RLE Insicional and dressing care: Reinforce dressings as needed Orthopedic device(s): None VTE prophylaxis: resume Eliquis POD#1 Pain control: PO pain medications as needed; judicious use of narcotics Follow - up plan: 2 weeks   Contact information:     Mattthew Ziomek A. Amedeo Kinsman, MD [facility] [address] [place],  [place]  [address] Phone: [phone] Fax: [phone]

## 2022-08-12 NOTE — TOC Progression Note (Signed)
Transition of Care [facility]) - Progression Note    Patient Details  Name: Jessica Myers MRN: [id number] Date of Birth: [birth year]  [facility] CM/SW Contact  Boneta Lucks, RN Phone Number: 08/12/2022, 3:26 PM  Clinical Narrative:   Patient now agreeable to go to SNF. She has been at the [facility] in the past and want to go there. FL2 sent and CM reached out to [facility]. Waiting for bed offers.   Expected Discharge Plan: [place] Barriers to Discharge: Continued Medical Work up  Expected Discharge Plan and Services Expected Discharge Plan: [place] In-house Referral: Clinical Social Work     Living arrangements for the past 2 months: Single Family Home   Readmission Risk Interventions     No data to display

## 2022-08-12 NOTE — Progress Notes (Signed)
PROGRESS NOTE    Jessica Myers  IOX:[id number] DOB: 08-Aug-1929 DOA: 08/09/2022 PCP: Monico Blitz, MD    Brief Narrative:  88 year old female who unfortunately suffered a fall at home after she tripped on her rug, admitted to the hospital with right hip fracture.  She has a history of paroxysmal atrial fibrillation on anticoagulation.  Eliquis was held on admission for anticipated surgery.  Heart rate is currently in sinus rhythm.  She was seen by orthopedics and she underwent operative management on 10/26.  We will need skilled nursing facility placement on discharge   Assessment & Plan:   Principal Problem:   Closed displaced fracture of right femoral neck (HCC) Active Problems:   Essential hypertension   Mixed hyperlipidemia   Acquired hypothyroidism   Atrial fibrillation (HCC)   Thrombocytopenia (HCC)   GERD (gastroesophageal reflux disease)   Vitamin D deficiency   Right leg swelling   History of CVA (cerebrovascular accident)   Acute right femoral neck fracture Mechanical fall -Seen by orthopedics -Status post operative management on 10/26 -WBAT -PT OT recommends skilled nursing facility placement -Continue pain management -Follow-up orthopedics in 2 weeks  Right lower extremity swelling -Ultrasound negative for DVT -Likely related to fracture  Hyperlipidemia -Continue Lipitor  Paroxysmal atrial fibrillation -Currently in sinus rhythm -She is continued on flecainide -Resume on Toprol 25 mg twice daily -Restart Eliquis  Hypothyroidism -Continue Synthroid  History of CVA -Continue on Lipitor -Continue Eliquis    DVT prophylaxis: SCDs Start: 08/10/22 0405,  apixaban (ELIQUIS) tablet 5 mg  Code Status: Full code Family Communication: Discussed with patient and husband at the bedside Disposition Plan: Status is: Inpatient Remains inpatient appropriate because: Awaiting skilled nursing facility placement     Consultants:   Orthopedics  Procedures:  CRPP of the Right femoral neck fracture  Antimicrobials:      Subjective: She is doing well today.  Ambulated with physical therapy.  Pain is reasonably controlled.  Objective: Vitals:   08/12/22 0001 08/12/22 0523 08/12/22 1503 08/12/22 1956  BP: (!) 146/55 (!) 158/80 (!) 142/66 (!) 145/64  Pulse: 66 67 66 65  Resp: '16 16 17 18  '$ Temp: 98.2 F (36.8 C) 98.3 F (36.8 C) 98.4 F (36.9 C) 98.1 F (36.7 C)  TempSrc: Oral Oral Oral Oral  SpO2: 94% 95% 95% 95%  Weight:      Height:        Intake/Output Summary (Last 24 hours) at 08/12/2022 2030 Last data filed at 08/12/2022 1957 Gross per 24 hour  Intake 720 ml  Output 1750 ml  Net -1030 ml   Filed Weights   08/10/22 1432 08/11/22 1039  Weight: 58.5 kg 58 kg    Examination:  General exam: Appears calm and comfortable  Respiratory system: Clear to auscultation. Respiratory effort normal. Cardiovascular system: S1 & S2 heard, RRR. No JVD, murmurs, rubs, gallops or clicks. No pedal edema. Gastrointestinal system: Abdomen is nondistended, soft and nontender. No organomegaly or masses felt. Normal bowel sounds heard. Central nervous system: Alert and oriented. No focal neurological deficits. Extremities: Symmetric 5 x 5 power. Skin: No rashes, lesions or ulcers Psychiatry: Judgement and insight appear normal. Mood & affect appropriate.     Data Reviewed: I have personally reviewed following labs and imaging studies  CBC: Recent Labs  Lab 08/09/22 2200 08/10/22 0432 08/12/22 0446  WBC 9.9 7.8 10.1  HGB 12.2 11.7* 11.9*  HCT 36.9 36.1 36.2  MCV 94.6 96.0 94.5  PLT 135* 126* 134*  Basic Metabolic Panel: Recent Labs  Lab 08/09/22 2200 08/10/22 0432 08/12/22 0446  NA 137 138 137  K 4.2 4.0 4.1  CL 105 106 103  CO2 '23 25 25  '$ GLUCOSE 104* 95 123*  BUN 24* 23 21  CREATININE 1.11* 1.09* 1.05*  CALCIUM 9.3 9.2 9.1  MG  --  1.8  --   PHOS  --  3.3  --    GFR: Estimated  Creatinine Clearance: 30.1 mL/min (A) (by C-G formula based on SCr of 1.05 mg/dL (H)). Liver Function Tests: Recent Labs  Lab 08/10/22 0432  AST 20  ALT 13  ALKPHOS 57  BILITOT 1.6*  PROT 5.8*  ALBUMIN 3.3*   No results for input(s): "LIPASE", "AMYLASE" in the last 168 hours. No results for input(s): "AMMONIA" in the last 168 hours. Coagulation Profile: Recent Labs  Lab 08/11/22 0416  INR 1.3*   Cardiac Enzymes: No results for input(s): "CKTOTAL", "CKMB", "CKMBINDEX", "TROPONINI" in the last 168 hours. BNP (last 3 results) No results for input(s): "PROBNP" in the last 8760 hours. HbA1C: No results for input(s): "HGBA1C" in the last 72 hours. CBG: No results for input(s): "GLUCAP" in the last 168 hours. Lipid Profile: No results for input(s): "CHOL", "HDL", "LDLCALC", "TRIG", "CHOLHDL", "LDLDIRECT" in the last 72 hours. Thyroid Function Tests: No results for input(s): "TSH", "T4TOTAL", "FREET4", "T3FREE", "THYROIDAB" in the last 72 hours. Anemia Panel: No results for input(s): "VITAMINB12", "FOLATE", "FERRITIN", "TIBC", "IRON", "RETICCTPCT" in the last 72 hours. Sepsis Labs: No results for input(s): "PROCALCITON", "LATICACIDVEN" in the last 168 hours.  Recent Results (from the past 240 hour(s))  MRSA Next Gen by PCR, Nasal     Status: None   Collection Time: 08/10/22  9:20 PM   Specimen: Nasal Mucosa; Nasal Swab  Result Value Ref Range Status   MRSA by PCR Next Gen NOT DETECTED NOT DETECTED Final    Comment: (NOTE) The GeneXpert MRSA Assay (FDA approved for NASAL specimens only), is one component of a comprehensive MRSA colonization surveillance program. It is not intended to diagnose MRSA infection nor to guide or monitor treatment for MRSA infections. Test performance is not FDA approved in patients less than 32 years old. Performed at [facility], [address], [place], [place] [address]          Radiology Studies: DG C-Arm 1-60 Min  Result Date:  08/11/2022 CLINICAL DATA:  ORIF right hip EXAM: DG HIP (WITH OR WITHOUT PELVIS) 2-3V RIGHT; DG C-ARM 1-60 MIN COMPARISON:  Right hip radiographs 08/09/2022 FINDINGS: Images were performed intraoperatively without the presence of a radiologist. Interval placement of 3 screws fixating the previously seen subcapital right femoral neck fracture. Total fluoroscopy images: 5 Total fluoroscopy time: 32 seconds Total dose: Radiation Exposure Index (as provided by the fluoroscopic device): 10.931 mGy air Kerma Please see intraoperative findings for further detail. IMPRESSION: Intraoperative images during ORIF of right femoral neck fracture. Electronically Signed   By: Yvonne Kendall M.D.   On: 08/11/2022 13:46   DG HIP UNILAT WITH PELVIS 2-3 VIEWS RIGHT  Result Date: 08/11/2022 CLINICAL DATA:  ORIF right hip EXAM: DG HIP (WITH OR WITHOUT PELVIS) 2-3V RIGHT; DG C-ARM 1-60 MIN COMPARISON:  Right hip radiographs 08/09/2022 FINDINGS: Images were performed intraoperatively without the presence of a radiologist. Interval placement of 3 screws fixating the previously seen subcapital right femoral neck fracture. Total fluoroscopy images: 5 Total fluoroscopy time: 32 seconds Total dose: Radiation Exposure Index (as provided by the fluoroscopic device): 10.931 mGy  air Kerma Please see intraoperative findings for further detail. IMPRESSION: Intraoperative images during ORIF of right femoral neck fracture. Electronically Signed   By: Yvonne Kendall M.D.   On: 08/11/2022 13:46   DG HIP UNILAT WITH PELVIS 2-3 VIEWS RIGHT  Result Date: 08/11/2022 CLINICAL DATA:  Femoral neck fracture EXAM: DG HIP (WITH OR WITHOUT PELVIS) 3V RIGHT COMPARISON:  None Available. FINDINGS: Screw fixation of right subcapital femoral neck fracture. Expected postsurgical changes of the overlying soft tissues. No evidence of new fracture. IMPRESSION: Postsurgical changes of screw fixation of right subcapital femoral neck fracture. Electronically Signed    By: Yetta Glassman M.D.   On: 08/11/2022 13:15        Scheduled Meds:  apixaban  5 mg Oral BID   atorvastatin  10 mg Oral QPM   calcium carbonate  1 tablet Oral BID WC   cholecalciferol  1,000 Units Oral Daily   flecainide  75 mg Oral Q12H   levothyroxine  112 mcg Oral Q0600   metoprolol succinate  25 mg Oral BID   pantoprazole  40 mg Oral Daily   polyethylene glycol  17 g Oral Daily   Continuous Infusions:  sodium chloride       LOS: 3 days    Time spent: 67mns    JKathie Dike MD [facility]   If 7PM-7AM, please contact night-coverage [email or web address]  08/12/2022, 8:30 PM

## 2022-08-12 NOTE — Plan of Care (Signed)
  Problem: Acute Rehab PT Goals(only PT should resolve) Goal: Pt Will Go Supine/Side To Sit Outcome: Progressing Flowsheets (Taken 08/12/2022 1345) Pt will go Supine/Side to Sit: with min guard assist Goal: Patient Will Transfer Sit To/From Stand Outcome: Progressing Flowsheets (Taken 08/12/2022 1345) Patient will transfer sit to/from stand: with supervision Goal: Pt Will Transfer Bed To Chair/Chair To Bed Outcome: Progressing Flowsheets (Taken 08/12/2022 1345) Pt will Transfer Bed to Chair/Chair to Bed:  with min assist  min guard assist Goal: Pt Will Ambulate Outcome: Progressing Flowsheets (Taken 08/12/2022 1345) Pt will Ambulate:  with min guard assist  with supervision  50 feet  with rolling walker   Zigmund Gottron, SPT

## 2022-08-12 NOTE — Evaluation (Signed)
Physical Therapy Evaluation Patient Details Name: Jessica Myers MRN: [id number] DOB: [birth year] Today's Date: 08/12/2022  History of Present Illness  Jessica Myers is a 88 y.o. female with medical history significant of hyperlipidemia, hypothyroidism, SSS s/p pacemaker placement, GERD, A-fib on Eliquis who presents emergency department via EMS from home due to sustaining a fall at home.  Patient tripped on a rug and fell landing on right hip, though she was able to get up, she kept limping and the pain worsened throughout the day to the extent that patient cannot bear weight on the right leg anymore.  EMS was activated and patient was sent to the ED for further evaluation and management.  Patient lives at home with husband.   Clinical Impression  Patient presents in chair and agreeable for therapy. Patient able to transfer to bed with min/mod assist and RW, demonstrating slightly labored bed mobility requiring min assist to move R LE on/off bed. Patient able to ambulate to room door with min assist/RW and verbal cueing to put weight through bilateral UE/walker with good carryover, limited mostly due to fatigue. Patient tolerated staying up in chair after therapy. Patient will benefit from continued skilled physical therapy in hospital and recommended venue below to increase strength, balance, endurance for safe ADLs and gait.     Recommendations for follow up therapy are one component of a multi-disciplinary discharge planning process, led by the attending physician.  Recommendations may be updated based on patient status, additional functional criteria and insurance authorization.  Follow Up Recommendations Skilled nursing-short term rehab (<3 hours/day) Can patient physically be transported by private vehicle: Yes    Assistance Recommended at Discharge Set up Supervision/Assistance  Patient can return home with the following  A little help with walking and/or transfers;Assistance with  cooking/housework;Help with stairs or ramp for entrance;A little help with bathing/dressing/bathroom    Equipment Recommendations None recommended by PT  Recommendations for Other Services       Functional Status Assessment Patient has had a recent decline in their functional status and demonstrates the ability to make significant improvements in function in a reasonable and predictable amount of time.     Precautions / Restrictions Precautions Precautions: Fall Restrictions Weight Bearing Restrictions: Yes RLE Weight Bearing: Weight bearing as tolerated      Mobility  Bed Mobility Overal bed mobility: Needs Assistance Bed Mobility: Supine to Sit, Sit to Supine     Supine to sit: Min assist Sit to supine: Min assist   General bed mobility comments: patient requires assistance to move RLE onto bed, min assist for moving RLE off of bed and elevating trunk with hand held assist    Transfers Overall transfer level: Needs assistance Equipment used: Rolling walker (2 wheels) Transfers: Sit to/from Stand, Bed to chair/wheelchair/BSC Sit to Stand: Min assist   Step pivot transfers: Min assist, Mod assist       General transfer comment: patient able to transfer from chair to bed with min/mod assist and RW, cued to put weight through bilateral UE/walker    Ambulation/Gait Ambulation/Gait assistance: Min assist Gait Distance (Feet): 18 Feet Assistive device: Rolling walker (2 wheels) Gait Pattern/deviations: Decreased step length - right, Decreased step length - left, Decreased weight shift to right, Decreased stride length Gait velocity: decreased     General Gait Details: patient slightly unsteady on feet, able to ambulate to room door and back with slowed cadence and min assist/RW  Stairs  Wheelchair Mobility    Modified Rankin (Stroke Patients Only)       Balance Overall balance assessment: Needs assistance Sitting-balance support: Bilateral  upper extremity supported, Feet supported Sitting balance-Leahy Scale: Good Sitting balance - Comments: seated in recliner   Standing balance support: Bilateral upper extremity supported, Reliant on assistive device for balance, During functional activity Standing balance-Leahy Scale: Fair Standing balance comment: with RW                             Pertinent Vitals/Pain Pain Assessment Pain Assessment: No/denies pain    Home Living Family/patient expects to be discharged to:: Private residence Living Arrangements: Spouse/significant other Available Help at Discharge: Family;Neighbor;Available 24 hours/day Type of Home: House Home Access: Stairs to enter Entrance Stairs-Rails: Right;Left;Can reach both Entrance Stairs-Number of Steps: 10 Alternate Level Stairs-Number of Steps: 14 stairs to basement, uses for laundry Home Layout: Laundry or work area in basement;One level Home Equipment: Grab bars - tub/shower;Shower Land (2 wheels)      Prior Function Prior Level of Function : Independent/Modified Independent;Driving             Mobility Comments: community ambulator w/o AD, drives, active ADLs Comments: Independent     Hand Dominance   Dominant Hand: Right    Extremity/Trunk Assessment   Upper Extremity Assessment Upper Extremity Assessment: Defer to OT evaluation    Lower Extremity Assessment Lower Extremity Assessment: Overall WFL for tasks assessed    Cervical / Trunk Assessment Cervical / Trunk Assessment: Normal  Communication   Communication: No difficulties  Cognition Arousal/Alertness: Awake/alert Behavior During Therapy: WFL for tasks assessed/performed Overall Cognitive Status: Within Functional Limits for tasks assessed                                          General Comments      Exercises     Assessment/Plan    PT Assessment Patient needs continued PT services  PT Problem List Decreased  strength;Decreased activity tolerance;Decreased balance;Decreased mobility       PT Treatment Interventions DME instruction;Balance training;Gait training;Stair training;Functional mobility training;Therapeutic activities;Patient/family education;Therapeutic exercise    PT Goals (Current goals can be found in the Care Plan section)  Acute Rehab PT Goals Patient Stated Goal: return home after rehab PT Goal Formulation: With patient Time For Goal Achievement: 08/12/22 Potential to Achieve Goals: Good    Frequency Min 4X/week     Co-evaluation PT/OT/SLP Co-Evaluation/Treatment: Yes Reason for Co-Treatment: To address functional/ADL transfers PT goals addressed during session: Mobility/safety with mobility;Balance;Proper use of DME OT goals addressed during session: ADL's and self-care       AM-PAC PT "6 Clicks" Mobility  Outcome Measure Help needed turning from your back to your side while in a flat bed without using bedrails?: None Help needed moving from lying on your back to sitting on the side of a flat bed without using bedrails?: A Little Help needed moving to and from a bed to a chair (including a wheelchair)?: A Little Help needed standing up from a chair using your arms (e.g., wheelchair or bedside chair)?: A Little Help needed to walk in hospital room?: A Little Help needed climbing 3-5 steps with a railing? : A Lot 6 Click Score: 18    End of Session   Activity Tolerance: Patient tolerated treatment well Patient left:  in chair;with call bell/phone within reach Nurse Communication: Mobility status PT Visit Diagnosis: Unsteadiness on feet (R26.81);Other abnormalities of gait and mobility (R26.89);Muscle weakness (generalized) (M62.81)    Time: 9672-2773 PT Time Calculation (min) (ACUTE ONLY): 20 min   Charges:   PT Evaluation $PT Eval Moderate Complexity: 1 Mod PT Treatments $Therapeutic Activity: 8-22 mins        Zigmund Gottron, SPT

## 2022-08-13 DIAGNOSIS — E039 Hypothyroidism, unspecified: Secondary | ICD-10-CM | POA: Diagnosis not present

## 2022-08-13 DIAGNOSIS — I4819 Other persistent atrial fibrillation: Secondary | ICD-10-CM | POA: Diagnosis not present

## 2022-08-13 DIAGNOSIS — I10 Essential (primary) hypertension: Secondary | ICD-10-CM | POA: Diagnosis not present

## 2022-08-13 DIAGNOSIS — S72001A Fracture of unspecified part of neck of right femur, initial encounter for closed fracture: Secondary | ICD-10-CM | POA: Diagnosis not present

## 2022-08-13 NOTE — Plan of Care (Signed)

## 2022-08-13 NOTE — Progress Notes (Signed)
PROGRESS NOTE    Jessica Myers  MAU:[id number] DOB: [birth year] DOA: 08/09/2022 PCP: Monico Blitz, MD    Brief Narrative:  88 year old female who unfortunately suffered a fall at home after she tripped on her rug, admitted to the hospital with right hip fracture.  She has a history of paroxysmal atrial fibrillation on anticoagulation.  Eliquis was held on admission for anticipated surgery.  Heart rate is currently in sinus rhythm.  She was seen by orthopedics and she underwent operative management on 10/26.  She is currently stable for skilled nursing facility placement once arrangements can be made   Assessment & Plan:   Principal Problem:   Closed displaced fracture of right femoral neck (HCC) Active Problems:   Essential hypertension   Mixed hyperlipidemia   Acquired hypothyroidism   Atrial fibrillation (HCC)   Thrombocytopenia (HCC)   GERD (gastroesophageal reflux disease)   Vitamin D deficiency   Right leg swelling   History of CVA (cerebrovascular accident)   Acute right femoral neck fracture Mechanical fall -Seen by orthopedics -Status post operative management on 10/26 -WBAT -PT OT recommends skilled nursing facility placement -Continue pain management -Follow-up orthopedics in 2 weeks  Right lower extremity swelling -Ultrasound negative for DVT -Likely related to fracture  Hyperlipidemia -Continue Lipitor  Paroxysmal atrial fibrillation -Currently in sinus rhythm -She is continued on flecainide -Resume on Toprol 25 mg twice daily -Restart Eliquis  Hypothyroidism -Continue Synthroid  History of CVA -Continue on Lipitor -Continue Eliquis    DVT prophylaxis: apixaban (ELIQUIS) tablet 2.5 mg Start: 08/13/22 1000 SCDs Start: 08/10/22 0405,  apixaban (ELIQUIS) tablet 2.5 mg  Code Status: Full code Family Communication: Discussed with patient and husband at the bedside Disposition Plan: Status is: Inpatient Remains inpatient appropriate because:  Awaiting skilled nursing facility placement     Consultants:  Orthopedics  Procedures:  CRPP of the Right femoral neck fracture  Antimicrobials:      Subjective: She is sitting up in the chair, she is in good spirits.  Does not have any complaints.  Objective: Vitals:   08/12/22 1956 08/13/22 0504 08/13/22 0931 08/13/22 1339  BP: (!) 145/64 136/65  91/60  Pulse: 65 60 68 60  Resp: 18 16    Temp: 98.1 F (36.7 C) 98.4 F (36.9 C)  (!) 97.4 F (36.3 C)  TempSrc: Oral Oral    SpO2: 95% 93%  95%  Weight:      Height:        Intake/Output Summary (Last 24 hours) at 08/13/2022 1918 Last data filed at 08/13/2022 1849 Gross per 24 hour  Intake 840 ml  Output 1100 ml  Net -260 ml   Filed Weights   08/10/22 1432 08/11/22 1039  Weight: 58.5 kg 58 kg    Examination:  General exam: Appears calm and comfortable  Respiratory system: Clear to auscultation. Respiratory effort normal. Cardiovascular system: S1 & S2 heard, RRR. No JVD, murmurs, rubs, gallops or clicks. No pedal edema. Gastrointestinal system: Abdomen is nondistended, soft and nontender. No organomegaly or masses felt. Normal bowel sounds heard. Central nervous system: Alert and oriented. No focal neurological deficits. Extremities: Symmetric 5 x 5 power. Skin: No rashes, lesions or ulcers Psychiatry: Judgement and insight appear normal. Mood & affect appropriate.     Data Reviewed: I have personally reviewed following labs and imaging studies  CBC: Recent Labs  Lab 08/09/22 2200 08/10/22 0432 08/12/22 0446  WBC 9.9 7.8 10.1  HGB 12.2 11.7* 11.9*  HCT 36.9 36.1 36.2  MCV 94.6 96.0 94.5  PLT 135* 126* 470*   Basic Metabolic Panel: Recent Labs  Lab 08/09/22 2200 08/10/22 0432 08/12/22 0446  NA 137 138 137  K 4.2 4.0 4.1  CL 105 106 103  CO2 '23 25 25  '$ GLUCOSE 104* 95 123*  BUN 24* 23 21  CREATININE 1.11* 1.09* 1.05*  CALCIUM 9.3 9.2 9.1  MG  --  1.8  --   PHOS  --  3.3  --     GFR: Estimated Creatinine Clearance: 30.1 mL/min (A) (by C-G formula based on SCr of 1.05 mg/dL (H)). Liver Function Tests: Recent Labs  Lab 08/10/22 0432  AST 20  ALT 13  ALKPHOS 57  BILITOT 1.6*  PROT 5.8*  ALBUMIN 3.3*   No results for input(s): "LIPASE", "AMYLASE" in the last 168 hours. No results for input(s): "AMMONIA" in the last 168 hours. Coagulation Profile: Recent Labs  Lab 08/11/22 0416  INR 1.3*   Cardiac Enzymes: No results for input(s): "CKTOTAL", "CKMB", "CKMBINDEX", "TROPONINI" in the last 168 hours. BNP (last 3 results) No results for input(s): "PROBNP" in the last 8760 hours. HbA1C: No results for input(s): "HGBA1C" in the last 72 hours. CBG: No results for input(s): "GLUCAP" in the last 168 hours. Lipid Profile: No results for input(s): "CHOL", "HDL", "LDLCALC", "TRIG", "CHOLHDL", "LDLDIRECT" in the last 72 hours. Thyroid Function Tests: No results for input(s): "TSH", "T4TOTAL", "FREET4", "T3FREE", "THYROIDAB" in the last 72 hours. Anemia Panel: No results for input(s): "VITAMINB12", "FOLATE", "FERRITIN", "TIBC", "IRON", "RETICCTPCT" in the last 72 hours. Sepsis Labs: No results for input(s): "PROCALCITON", "LATICACIDVEN" in the last 168 hours.  Recent Results (from the past 240 hour(s))  MRSA Next Gen by PCR, Nasal     Status: None   Collection Time: 08/10/22  9:20 PM   Specimen: Nasal Mucosa; Nasal Swab  Result Value Ref Range Status   MRSA by PCR Next Gen NOT DETECTED NOT DETECTED Final    Comment: (NOTE) The GeneXpert MRSA Assay (FDA approved for NASAL specimens only), is one component of a comprehensive MRSA colonization surveillance program. It is not intended to diagnose MRSA infection nor to guide or monitor treatment for MRSA infections. Test performance is not FDA approved in patients less than 49 years old. Performed at [facility], [address], [place], [place] [address]          Radiology Studies: No results  found.      Scheduled Meds:  apixaban  2.5 mg Oral BID   atorvastatin  10 mg Oral QPM   calcium carbonate  1 tablet Oral BID WC   cholecalciferol  1,000 Units Oral Daily   flecainide  75 mg Oral Q12H   levothyroxine  112 mcg Oral Q0600   metoprolol succinate  25 mg Oral BID   pantoprazole  40 mg Oral Daily   polyethylene glycol  17 g Oral Daily   Continuous Infusions:  sodium chloride       LOS: 4 days    Time spent: 93mns    JKathie Dike MD [facility]   If 7PM-7AM, please contact night-coverage [email or web address]  08/13/2022, 7:18 PM

## 2022-08-13 NOTE — Progress Notes (Signed)
Physical Therapy Treatment Patient Details Name: Jessica Myers MRN: [id number] DOB: August 31, 1929 Today's Date: 08/13/2022   History of Present Illness Jessica Myers is a 88 y.o. female with medical history significant of hyperlipidemia, hypothyroidism, SSS s/p pacemaker placement, GERD, A-fib on Eliquis who presents emergency department via EMS from home due to sustaining a fall at home.  Patient tripped on a rug and fell landing on right hip, though she was able to get up, she kept limping and the pain worsened throughout the day to the extent that patient cannot bear weight on the right leg anymore.  EMS was activated and patient was sent to the ED for further evaluation and management.  Patient lives at home with husband.    PT Comments    Patient supine in bed upon therapist arrival and agreeable to participating in therapy session today. Patient's husband present throughout session. Patient required less physical assistance for all mobility today. Patient did require verbal and tactile cuing for step sequencing and hand placement for transfers and ambulation with RW. Patient performed therapeutic exercises seated in chair and agreeable to remaining in chair at end of session to eat her lunch when it arrives.  Therapist recommended patient be out of bed for meals to increase her mobility prior to next therapy session. Patient would continue to benefit from skilled physical therapy in current environment and next venue to continue return to prior function and increase strength, endurance, balance, coordination, and functional mobility and gait skills.     Recommendations for follow up therapy are one component of a multi-disciplinary discharge planning process, led by the attending physician.  Recommendations may be updated based on patient status, additional functional criteria and insurance authorization.  Follow Up Recommendations  Skilled nursing-short term rehab (<3 hours/day) Can patient  physically be transported by private vehicle: Yes   Assistance Recommended at Discharge Set up Supervision/Assistance  Patient can return home with the following A little help with walking and/or transfers;Assistance with cooking/housework;Help with stairs or ramp for entrance;A little help with bathing/dressing/bathroom;Assist for transportation   Equipment Recommendations  None recommended by PT    Recommendations for Other Services       Precautions / Restrictions Precautions Precautions: Fall Restrictions Weight Bearing Restrictions: Yes RLE Weight Bearing: Weight bearing as tolerated     Mobility  Bed Mobility Overal bed mobility: Needs Assistance Bed Mobility: Supine to Sit     Supine to sit: Min assist, HOB elevated     General bed mobility comments: patient requires assistance to move RLE off of bed  with cues to use bedrails for elevating trunk and hand held assist    Transfers Overall transfer level: Needs assistance Equipment used: Rolling walker (2 wheels) Transfers: Sit to/from Stand, Bed to chair/wheelchair/BSC Sit to Stand: Min guard   Step pivot transfers: Min guard   Anterior-Posterior transfers: Min guard, Supervision   General transfer comment: patient able to transfer from chair to bed with min guard assist and RW; cues for step sequencing and hand placement with RW use    Ambulation/Gait Ambulation/Gait assistance: Min assist Gait Distance (Feet): 40 Feet Assistive device: Rolling walker (2 wheels) Gait Pattern/deviations: Decreased step length - right, Decreased step length - left, Decreased weight shift to right, Decreased stride length, Step-through pattern Gait velocity: decreased     General Gait Details: slow, labored cadence using RW with short, shuffle-type steps with RW and min guard; limited by fatigue; on room air throughout session   Stairs  Wheelchair Mobility    Modified Rankin (Stroke Patients Only)        Balance Overall balance assessment: Needs assistance Sitting-balance support: Bilateral upper extremity supported, Feet supported Sitting balance-Leahy Scale: Good Sitting balance - Comments: seated in recliner   Standing balance support: Bilateral upper extremity supported, Reliant on assistive device for balance, During functional activity Standing balance-Leahy Scale: Fair Standing balance comment: with RW        Cognition Arousal/Alertness: Awake/alert Behavior During Therapy: WFL for tasks assessed/performed Overall Cognitive Status: Within Functional Limits for tasks assessed          Exercises General Exercises - Lower Extremity Long Arc Quad: AROM, Strengthening, Both, 10 reps, Seated Hip Flexion/Marching: AROM, Strengthening, Both, 10 reps, Seated    General Comments        Pertinent Vitals/Pain Pain Assessment Pain Assessment: No/denies pain    Home Living             Prior Function            PT Goals (current goals can now be found in the care plan section) Acute Rehab PT Goals Patient Stated Goal: return home after rehab PT Goal Formulation: With patient Time For Goal Achievement: 08/12/22 Potential to Achieve Goals: Good Progress towards PT goals: Progressing toward goals    Frequency    Min 4X/week      PT Plan Current plan remains appropriate       AM-PAC PT "6 Clicks" Mobility   Outcome Measure  Help needed turning from your back to your side while in a flat bed without using bedrails?: None Help needed moving from lying on your back to sitting on the side of a flat bed without using bedrails?: A Little Help needed moving to and from a bed to a chair (including a wheelchair)?: A Little Help needed standing up from a chair using your arms (e.g., wheelchair or bedside chair)?: A Little Help needed to walk in hospital room?: A Little Help needed climbing 3-5 steps with a railing? : A Lot 6 Click Score: 18    End of  Session Equipment Utilized During Treatment: Gait belt Activity Tolerance: Patient tolerated treatment well;Patient limited by fatigue Patient left: in chair;with call bell/phone within reach;with family/visitor present Nurse Communication: Mobility status PT Visit Diagnosis: Unsteadiness on feet (R26.81);Other abnormalities of gait and mobility (R26.89);Muscle weakness (generalized) (M62.81)     Time: 5038-8828 PT Time Calculation (min) (ACUTE ONLY): 30 min  Charges:  $Therapeutic Exercise: 8-22 mins $Therapeutic Activity: 8-22 mins                     Floria Raveling. Hartnett-Rands, MS, PT Per [place] [phone]  Pamala Hurry  Hartnett-Rands 08/13/2022, 11:49 AM

## 2022-08-14 DIAGNOSIS — I4819 Other persistent atrial fibrillation: Secondary | ICD-10-CM | POA: Diagnosis not present

## 2022-08-14 DIAGNOSIS — S72001A Fracture of unspecified part of neck of right femur, initial encounter for closed fracture: Secondary | ICD-10-CM | POA: Diagnosis not present

## 2022-08-14 DIAGNOSIS — I10 Essential (primary) hypertension: Secondary | ICD-10-CM | POA: Diagnosis not present

## 2022-08-14 DIAGNOSIS — E039 Hypothyroidism, unspecified: Secondary | ICD-10-CM | POA: Diagnosis not present

## 2022-08-14 MED ORDER — APIXABAN 2.5 MG PO TABS: 2.5000 mg | ORAL_TABLET | Freq: Two times a day (BID) | ORAL | Status: DC

## 2022-08-14 MED ORDER — METOPROLOL SUCCINATE ER 25 MG PO TB24: 25.0000 mg | ORAL_TABLET | Freq: Two times a day (BID) | ORAL | 11 refills | Status: DC

## 2022-08-14 NOTE — Discharge Summary (Signed)
Physician Discharge Summary  OPAL DINNING TOI:[id number] DOB: November 18, 1928 DOA: 08/09/2022  PCP: Monico Blitz, MD  Admit date: 08/09/2022 Discharge date: 08/15/2022  Admitted From: home Disposition: Skilled nursing facility  Recommendations for Outpatient Follow-up:  Follow up with PCP in 1-2 weeks Please obtain BMP/CBC in one week Follow-up with orthopedics in 2 weeks Weightbearing as tolerated on right lower extremity    Discharge Condition: Stable CODE STATUS: Full code Diet recommendation: Heart healthy  Brief/Interim Summary: 88 year old female who unfortunately suffered a fall at home after she tripped on her rug, admitted to the hospital with right hip fracture.  She has a history of paroxysmal atrial fibrillation on anticoagulation.  Eliquis was held on admission for anticipated surgery.  Heart rate is currently in sinus rhythm.  She was seen by orthopedics and she underwent operative management on 10/26.  She is currently stable for skilled nursing facility placement once arrangements can be made  Discharge Diagnoses:  Principal Problem:   Closed displaced fracture of right femoral neck (HCC) Active Problems:   Essential hypertension   Mixed hyperlipidemia   Acquired hypothyroidism   Atrial fibrillation (HCC)   Thrombocytopenia (HCC)   GERD (gastroesophageal reflux disease)   Vitamin D deficiency   Right leg swelling   History of CVA (cerebrovascular accident)  Acute right femoral neck fracture Mechanical fall -Seen by orthopedics -Status post operative management on 10/26 -WBAT -PT OT recommends skilled nursing facility placement -Continue pain management -Follow-up orthopedics in 2 weeks   Right lower extremity swelling -Ultrasound negative for DVT -Likely related to fracture   Hyperlipidemia -Continue Lipitor   Paroxysmal atrial fibrillation -Currently in sinus rhythm -She is continued on flecainide -Continue Toprol twice daily -Eliquis dose  reduced to 2.5 mg based on age/renal function   Hypothyroidism -Continue Synthroid   History of CVA -Continue on Lipitor -Continue Eliquis  Discharge Instructions   Allergies as of 08/14/2022       Reactions   Biaxin [clarithromycin] Shortness Of Breath, Rash   Unknown   Penicillins Hives   Has patient had a PCN reaction causing immediate rash, facial/tongue/throat swelling, SOB or lightheadedness with hypotension: Yes Has patient had a PCN reaction causing severe rash involving mucus membranes or skin necrosis: No Has patient had a PCN reaction that required hospitalization No Has patient had a PCN reaction occurring within the last 10 years: No If all of the above answers are "NO", then may proceed with Cephalosporin use. ANCEF on 08/11/22 without issue   Shellfish Allergy Nausea And Vomiting   Sulfa Antibiotics Other (See Comments)   "bad reaction," per patient   Celebrex [celecoxib] Rash        Medication List     TAKE these medications    acetaminophen 500 MG tablet Commonly known as: TYLENOL Take 500-1,000 mg by mouth every 6 (six) hours as needed for mild pain.   apixaban 2.5 MG Tabs tablet Commonly known as: Eliquis Take 1 tablet (2.5 mg total) by mouth 2 (two) times daily. What changed:  medication strength how much to take   atorvastatin 10 MG tablet Commonly known as: LIPITOR Take 10 mg by mouth every evening.   calcium carbonate 1500 (600 Ca) MG Tabs tablet Commonly known as: OSCAL Take by mouth 2 (two) times daily with a meal.   cholecalciferol 1000 units tablet Commonly known as: VITAMIN D Take 1,000 Units by mouth daily.   flecainide 150 MG tablet Commonly known as: TAMBOCOR TAKE HALF A TABLET (75 MG TOTAL) BY  MOUTH 2 (TWO) TIMES DAILY.   KRILL OIL PO Take by mouth.   levothyroxine 112 MCG tablet Commonly known as: SYNTHROID Take 112 mcg by mouth daily before breakfast.   lisinopril 20 MG tablet Commonly known as: ZESTRIL Take 1  tablet (20 mg total) by mouth 2 (two) times daily.   meclizine 25 MG tablet Commonly known as: ANTIVERT Take 25 mg by mouth 3 (three) times daily as needed for dizziness.   metoprolol succinate 25 MG 24 hr tablet Commonly known as: Toprol XL Take 1 tablet (25 mg total) by mouth 2 (two) times daily. Take 2-3 Times Daily as Directed. What changed:  how much to take how to take this when to take this   omeprazole 20 MG capsule Commonly known as: PRILOSEC Take 20 mg by mouth at bedtime.   Polyethyl Glycol-Propyl Glycol 0.4-0.3 % Soln Place 1 drop into both eyes 3 (three) times daily as needed (for dry eyes.).   polyethylene glycol 17 g packet Commonly known as: MIRALAX / GLYCOLAX Take 17 g by mouth daily as needed (for constipation.).   PRESERVISION AREDS PO Take 1 tablet by mouth daily.   simethicone 125 MG chewable tablet Commonly known as: MYLICON Chew 366-440 mg by mouth every 6 (six) hours as needed for flatulence.        Allergies  Allergen Reactions   Biaxin [Clarithromycin] Shortness Of Breath and Rash    Unknown   Penicillins Hives    Has patient had a PCN reaction causing immediate rash, facial/tongue/throat swelling, SOB or lightheadedness with hypotension: Yes Has patient had a PCN reaction causing severe rash involving mucus membranes or skin necrosis: No Has patient had a PCN reaction that required hospitalization No Has patient had a PCN reaction occurring within the last 10 years: No If all of the above answers are "NO", then may proceed with Cephalosporin use. ANCEF on 08/11/22 without issue     Shellfish Allergy Nausea And Vomiting   Sulfa Antibiotics Other (See Comments)    "bad reaction," per patient   Celebrex [Celecoxib] Rash    Consultations: Orthopedics   Procedures/Studies: DG C-Arm 1-60 Min  Result Date: 08/11/2022 CLINICAL DATA:  ORIF right hip EXAM: DG HIP (WITH OR WITHOUT PELVIS) 2-3V RIGHT; DG C-ARM 1-60 MIN COMPARISON:  Right  hip radiographs 08/09/2022 FINDINGS: Images were performed intraoperatively without the presence of a radiologist. Interval placement of 3 screws fixating the previously seen subcapital right femoral neck fracture. Total fluoroscopy images: 5 Total fluoroscopy time: 32 seconds Total dose: Radiation Exposure Index (as provided by the fluoroscopic device): 10.931 mGy air Kerma Please see intraoperative findings for further detail. IMPRESSION: Intraoperative images during ORIF of right femoral neck fracture. Electronically Signed   By: Yvonne Kendall M.D.   On: 08/11/2022 13:46   DG HIP UNILAT WITH PELVIS 2-3 VIEWS RIGHT  Result Date: 08/11/2022 CLINICAL DATA:  ORIF right hip EXAM: DG HIP (WITH OR WITHOUT PELVIS) 2-3V RIGHT; DG C-ARM 1-60 MIN COMPARISON:  Right hip radiographs 08/09/2022 FINDINGS: Images were performed intraoperatively without the presence of a radiologist. Interval placement of 3 screws fixating the previously seen subcapital right femoral neck fracture. Total fluoroscopy images: 5 Total fluoroscopy time: 32 seconds Total dose: Radiation Exposure Index (as provided by the fluoroscopic device): 10.931 mGy air Kerma Please see intraoperative findings for further detail. IMPRESSION: Intraoperative images during ORIF of right femoral neck fracture. Electronically Signed   By: Yvonne Kendall M.D.   On: 08/11/2022 13:46   DG  HIP UNILAT WITH PELVIS 2-3 VIEWS RIGHT  Result Date: 08/11/2022 CLINICAL DATA:  Femoral neck fracture EXAM: DG HIP (WITH OR WITHOUT PELVIS) 3V RIGHT COMPARISON:  None Available. FINDINGS: Screw fixation of right subcapital femoral neck fracture. Expected postsurgical changes of the overlying soft tissues. No evidence of new fracture. IMPRESSION: Postsurgical changes of screw fixation of right subcapital femoral neck fracture. Electronically Signed   By: Yetta Glassman M.D.   On: 08/11/2022 13:15   DG CHEST PORT 1 VIEW  Result Date: 08/10/2022 CLINICAL DATA:  Preoperative  assessment. EXAM: PORTABLE CHEST 1 VIEW COMPARISON:  07/03/2016. FINDINGS: Heart is enlarged the mediastinal contour stable. There is atherosclerotic calcification of the aorta. No consolidation, effusion, or pneumothorax. A dual lead pacemaker is present over the left chest. No acute osseous abnormality. IMPRESSION: 1. Cardiomegaly. 2. No active disease. Electronically Signed   By: Brett Fairy M.D.   On: 08/10/2022 20:42   US Venous Img Lower Unilateral Right (DVT)  Result Date: 08/10/2022 CLINICAL DATA:  Right leg swelling EXAM: Right LOWER EXTREMITY VENOUS DOPPLER ULTRASOUND TECHNIQUE: Gray-scale sonography with compression, as well as color and duplex ultrasound, were performed to evaluate the deep venous system(s) from the level of the common femoral vein through the popliteal and proximal calf veins. COMPARISON:  None Available. FINDINGS: VENOUS Normal compressibility of the common femoral, superficial femoral, and popliteal veins, as well as the visualized calf veins. Visualized portions of profunda femoral vein and great saphenous vein unremarkable. No filling defects to suggest DVT on grayscale or color Doppler imaging. Doppler waveforms show normal direction of venous flow, normal respiratory plasticity and response to augmentation. Limited views of the contralateral common femoral vein are unremarkable. OTHER None. Limitations: none IMPRESSION: No evidence of DVT in the right lower extremity. Electronically Signed   By: Marin Roberts M.D.   On: 08/10/2022 11:28   CT PELVIS WO CONTRAST  Result Date: 08/09/2022 CLINICAL DATA:  Hip trauma with fracture suspected. X-ray suggested subcapital irregularity and possible sacral ala fracture. Fell to the right hip with pain. Trip and fall injury. EXAM: CT PELVIS WITHOUT CONTRAST TECHNIQUE: Multidetector CT imaging of the pelvis was performed following the standard protocol without intravenous contrast. RADIATION DOSE REDUCTION: This exam was performed  according to the departmental dose-optimization program which includes automated exposure control, adjustment of the mA and/or kV according to patient size and/or use of iterative reconstruction technique. COMPARISON:  Right hip radiographs 1024 23. CT abdomen and pelvis 07/02/2016 FINDINGS: Urinary Tract: Bladder is unremarkable. There is a cyst in the right mid abdomen, incompletely included but likely representing a lower pole renal cyst and measuring 5.7 cm in diameter. No change since prior study. No imaging follow-up is indicated. Bowel: Visualized portions of small and large bowel are not abnormally distended. No wall thickening is appreciated. The visualized colon is diffusely stool-filled. The appendix is normal. Vascular/Lymphatic: Calcification of the lower aorta and iliac arteries. No aneurysm. Reproductive:  No mass or other significant abnormality Other: No free air or free fluid demonstrated. Abdominal wall musculature appears intact. Musculoskeletal: There is an acute subcapital fracture of the right femoral neck with mild impaction of fracture fragments and posterior angulation of the distal fracture fragments. No dislocation at the hip joint. The sacrum appears intact. No acute displaced fractures are identified to involve the sacrum, pelvis, or left hip. Degenerative changes in the lower lumbar spine and hips. IMPRESSION: 1. Acute impacted fracture of the right femoral neck. 2. No additional acute fractures are  identified. 3. Degenerative changes in the lower lumbar spine and hips. Electronically Signed   By: Lucienne Capers M.D.   On: 08/09/2022 21:29   DG Hip Unilat  With Pelvis 2-3 Views Right  Result Date: 08/09/2022 CLINICAL DATA:  Fall at home today with right hip pain. EXAM: DG HIP (WITH OR WITHOUT PELVIS) 2-3V RIGHT COMPARISON:  None Available. FINDINGS: There is a cortical irregularity in the subcapital region of the proximal right femur suggesting impaction fracture. No  dislocation. Cortical irregularity is also noted at the sacral ala on the right, possible fracture. Mild degenerative changes are noted at the hips bilaterally in the lower lumbar spine. IMPRESSION: 1. Findings suggestive of subcapital femoral fracture on the right. 2. Cortical irregularity in the sacral ala on the right, possible fracture. CT pelvis is recommended for further evaluation. Electronically Signed   By: Brett Fairy M.D.   On: 08/09/2022 20:03      Subjective: She is feeling well.  Ambulated with physical therapy.  No complaints.  Discharge Exam: Vitals:   08/13/22 2008 08/14/22 0455 08/14/22 0900 08/14/22 1329  BP: 133/73 127/68  (!) 140/65  Pulse: 60 (!) 58 60 64  Resp:    18  Temp:  98.9 F (37.2 C)  98.9 F (37.2 C)  TempSrc:      SpO2:  94%  100%  Weight:      Height:        General: Pt is alert, awake, not in acute distress Cardiovascular: RRR, S1/S2 +, no rubs, no gallops Respiratory: CTA bilaterally, no wheezing, no rhonchi Abdominal: Soft, NT, ND, bowel sounds + Extremities: no edema, no cyanosis    The results of significant diagnostics from this hospitalization (including imaging, microbiology, ancillary and laboratory) are listed below for reference.     Microbiology: Recent Results (from the past 240 hour(s))  MRSA Next Gen by PCR, Nasal     Status: None   Collection Time: 08/10/22  9:20 PM   Specimen: Nasal Mucosa; Nasal Swab  Result Value Ref Range Status   MRSA by PCR Next Gen NOT DETECTED NOT DETECTED Final    Comment: (NOTE) The GeneXpert MRSA Assay (FDA approved for NASAL specimens only), is one component of a comprehensive MRSA colonization surveillance program. It is not intended to diagnose MRSA infection nor to guide or monitor treatment for MRSA infections. Test performance is not FDA approved in patients less than 58 years old. Performed at [facility], [address] [place], [place] [address]      Labs: BNP (last 3  results) No results for input(s): "BNP" in the last 8760 hours. Basic Metabolic Panel: Recent Labs  Lab 08/09/22 2200 08/10/22 0432 08/12/22 0446  NA 137 138 137  K 4.2 4.0 4.1  CL 105 106 103  CO2 '23 25 25  '$ GLUCOSE 104* 95 123*  BUN 24* 23 21  CREATININE 1.11* 1.09* 1.05*  CALCIUM 9.3 9.2 9.1  MG  --  1.8  --   PHOS  --  3.3  --    Liver Function Tests: Recent Labs  Lab 08/10/22 0432  AST 20  ALT 13  ALKPHOS 57  BILITOT 1.6*  PROT 5.8*  ALBUMIN 3.3*   No results for input(s): "LIPASE", "AMYLASE" in the last 168 hours. No results for input(s): "AMMONIA" in the last 168 hours. CBC: Recent Labs  Lab 08/09/22 2200 08/10/22 0432 08/12/22 0446  WBC 9.9 7.8 10.1  HGB 12.2 11.7* 11.9*  HCT 36.9 36.1 36.2  MCV 94.6 96.0 94.5  PLT 135* 126* 134*   Cardiac Enzymes: No results for input(s): "CKTOTAL", "CKMB", "CKMBINDEX", "TROPONINI" in the last 168 hours. BNP: Invalid input(s): "POCBNP" CBG: No results for input(s): "GLUCAP" in the last 168 hours. D-Dimer No results for input(s): "DDIMER" in the last 72 hours. Hgb A1c No results for input(s): "HGBA1C" in the last 72 hours. Lipid Profile No results for input(s): "CHOL", "HDL", "LDLCALC", "TRIG", "CHOLHDL", "LDLDIRECT" in the last 72 hours. Thyroid function studies No results for input(s): "TSH", "T4TOTAL", "T3FREE", "THYROIDAB" in the last 72 hours.  Invalid input(s): "FREET3" Anemia work up No results for input(s): "VITAMINB12", "FOLATE", "FERRITIN", "TIBC", "IRON", "RETICCTPCT" in the last 72 hours. Urinalysis    Component Value Date/Time   COLORURINE YELLOW 07/11/2013 2031   APPEARANCEUR CLEAR 07/11/2013 2031   LABSPEC 1.014 07/11/2013 2031   PHURINE 5.5 07/11/2013 2031   GLUCOSEU NEGATIVE 07/11/2013 2031   HGBUR NEGATIVE 07/11/2013 2031   BILIRUBINUR NEGATIVE 07/11/2013 2031   KETONESUR NEGATIVE 07/11/2013 2031   PROTEINUR NEGATIVE 07/11/2013 2031   UROBILINOGEN 1.0 07/11/2013 2031   NITRITE  NEGATIVE 07/11/2013 2031   LEUKOCYTESUR NEGATIVE 07/11/2013 2031   Sepsis Labs Recent Labs  Lab 08/09/22 2200 08/10/22 0432 08/12/22 0446  WBC 9.9 7.8 10.1   Microbiology Recent Results (from the past 240 hour(s))  MRSA Next Gen by PCR, Nasal     Status: None   Collection Time: 08/10/22  9:20 PM   Specimen: Nasal Mucosa; Nasal Swab  Result Value Ref Range Status   MRSA by PCR Next Gen NOT DETECTED NOT DETECTED Final    Comment: (NOTE) The GeneXpert MRSA Assay (FDA approved for NASAL specimens only), is one component of a comprehensive MRSA colonization surveillance program. It is not intended to diagnose MRSA infection nor to guide or monitor treatment for MRSA infections. Test performance is not FDA approved in patients less than 33 years old. Performed at [facility], [address] [place], [place] [address]      Time coordinating discharge: 70mns  SIGNED:   JKathie Dike MD  [facility] 08/14/2022, 4:50 PM   If 7PM-7AM, please contact night-coverage [email or web address]

## 2022-08-14 NOTE — Progress Notes (Signed)
Pt alert and oriented this shift. Tylenol given once for right leg pain. Patient stated her left leg was a little sore too. No other complaints throughout the night. Pt slept well.

## 2022-08-15 DIAGNOSIS — R488 Other symbolic dysfunctions: Secondary | ICD-10-CM | POA: Diagnosis not present

## 2022-08-15 DIAGNOSIS — I10 Essential (primary) hypertension: Secondary | ICD-10-CM | POA: Diagnosis not present

## 2022-08-15 DIAGNOSIS — Z9181 History of falling: Secondary | ICD-10-CM | POA: Diagnosis not present

## 2022-08-15 DIAGNOSIS — E440 Moderate protein-calorie malnutrition: Secondary | ICD-10-CM | POA: Diagnosis not present

## 2022-08-15 DIAGNOSIS — E559 Vitamin D deficiency, unspecified: Secondary | ICD-10-CM | POA: Diagnosis not present

## 2022-08-15 DIAGNOSIS — N1831 Chronic kidney disease, stage 3a: Secondary | ICD-10-CM | POA: Diagnosis not present

## 2022-08-15 DIAGNOSIS — I4819 Other persistent atrial fibrillation: Secondary | ICD-10-CM | POA: Diagnosis not present

## 2022-08-15 DIAGNOSIS — M6281 Muscle weakness (generalized): Secondary | ICD-10-CM | POA: Diagnosis not present

## 2022-08-15 DIAGNOSIS — R4189 Other symptoms and signs involving cognitive functions and awareness: Secondary | ICD-10-CM | POA: Diagnosis not present

## 2022-08-15 DIAGNOSIS — R600 Localized edema: Secondary | ICD-10-CM | POA: Diagnosis not present

## 2022-08-15 DIAGNOSIS — M7989 Other specified soft tissue disorders: Secondary | ICD-10-CM | POA: Diagnosis not present

## 2022-08-15 DIAGNOSIS — D696 Thrombocytopenia, unspecified: Secondary | ICD-10-CM | POA: Diagnosis not present

## 2022-08-15 DIAGNOSIS — Z7901 Long term (current) use of anticoagulants: Secondary | ICD-10-CM | POA: Diagnosis not present

## 2022-08-15 DIAGNOSIS — R279 Unspecified lack of coordination: Secondary | ICD-10-CM | POA: Diagnosis not present

## 2022-08-15 DIAGNOSIS — S72001A Fracture of unspecified part of neck of right femur, initial encounter for closed fracture: Secondary | ICD-10-CM | POA: Diagnosis not present

## 2022-08-15 DIAGNOSIS — K5909 Other constipation: Secondary | ICD-10-CM | POA: Diagnosis not present

## 2022-08-15 DIAGNOSIS — R262 Difficulty in walking, not elsewhere classified: Secondary | ICD-10-CM | POA: Diagnosis not present

## 2022-08-15 DIAGNOSIS — I700 Atherosclerosis of aorta: Secondary | ICD-10-CM | POA: Diagnosis not present

## 2022-08-15 DIAGNOSIS — E039 Hypothyroidism, unspecified: Secondary | ICD-10-CM | POA: Diagnosis not present

## 2022-08-15 DIAGNOSIS — F411 Generalized anxiety disorder: Secondary | ICD-10-CM | POA: Diagnosis not present

## 2022-08-15 DIAGNOSIS — I639 Cerebral infarction, unspecified: Secondary | ICD-10-CM | POA: Diagnosis not present

## 2022-08-15 DIAGNOSIS — K219 Gastro-esophageal reflux disease without esophagitis: Secondary | ICD-10-CM | POA: Diagnosis not present

## 2022-08-15 DIAGNOSIS — Z741 Need for assistance with personal care: Secondary | ICD-10-CM | POA: Diagnosis not present

## 2022-08-15 DIAGNOSIS — E785 Hyperlipidemia, unspecified: Secondary | ICD-10-CM | POA: Diagnosis not present

## 2022-08-15 DIAGNOSIS — R29818 Other symptoms and signs involving the nervous system: Secondary | ICD-10-CM | POA: Diagnosis not present

## 2022-08-15 DIAGNOSIS — S72001D Fracture of unspecified part of neck of right femur, subsequent encounter for closed fracture with routine healing: Secondary | ICD-10-CM | POA: Diagnosis not present

## 2022-08-15 DIAGNOSIS — I482 Chronic atrial fibrillation, unspecified: Secondary | ICD-10-CM | POA: Diagnosis not present

## 2022-08-15 DIAGNOSIS — Z950 Presence of cardiac pacemaker: Secondary | ICD-10-CM | POA: Diagnosis not present

## 2022-08-15 DIAGNOSIS — Z8673 Personal history of transient ischemic attack (TIA), and cerebral infarction without residual deficits: Secondary | ICD-10-CM | POA: Diagnosis not present

## 2022-08-15 LAB — SARS CORONAVIRUS 2 BY RT PCR: SARS Coronavirus 2 by RT PCR: NEGATIVE

## 2022-08-15 NOTE — Progress Notes (Signed)
Patient seen and examined.  She does not have any complaints today. She is looking forward to going to SNF today. Reports pain is controlled.   Vitals stable Lungs clear Heart sounds are regular   Plan is to discharge to SNF today for rehab Follow up with orthopedics in 2 weeks Refer to DC summary done yesterday for further details around hospital course No change in patient condition since yesterday or discharge medications She is otherwise stable for discharge  [facility]

## 2022-08-15 NOTE — Progress Notes (Signed)
Called and gave report to [place] at [facility]. Pt is awaiting in room for transport.

## 2022-08-15 NOTE — Care Management Important Message (Signed)
Important Message  Patient Details  Name: Jessica Myers MRN: [id number] Date of Birth: 06/17/1929   Medicare Important Message Given:  Yes     Tommy Medal 08/15/2022, 10:48 AM

## 2022-08-15 NOTE — Progress Notes (Signed)
Patient rested quietly through night, no c/o pain or discomfort, surgical bandage lifted to check site, site C/D/I with minimal drainage noted. Patient resting quietly with call bell in reach.

## 2022-08-15 NOTE — Progress Notes (Signed)
Ng Discharge Note  Admit Date:  08/09/2022 Discharge date: 08/15/2022   Nealy Hickmon Kingsley to be D/C'd to skilled nursing facility per MD order.  AVS completed. Patient/caregiver able to verbalize understanding.  Discharge Medication: Allergies as of 08/15/2022       Reactions   Biaxin [clarithromycin] Shortness Of Breath, Rash   Unknown   Penicillins Hives   Has patient had a PCN reaction causing immediate rash, facial/tongue/throat swelling, SOB or lightheadedness with hypotension: Yes Has patient had a PCN reaction causing severe rash involving mucus membranes or skin necrosis: No Has patient had a PCN reaction that required hospitalization No Has patient had a PCN reaction occurring within the last 10 years: No If all of the above answers are "NO", then may proceed with Cephalosporin use. ANCEF on 08/11/22 without issue   Shellfish Allergy Nausea And Vomiting   Sulfa Antibiotics Other (See Comments)   "bad reaction," per patient   Celebrex [celecoxib] Rash        Medication List     TAKE these medications    acetaminophen 500 MG tablet Commonly known as: TYLENOL Take 500-1,000 mg by mouth every 6 (six) hours as needed for mild pain.   apixaban 2.5 MG Tabs tablet Commonly known as: Eliquis Take 1 tablet (2.5 mg total) by mouth 2 (two) times daily. What changed:  medication strength how much to take   atorvastatin 10 MG tablet Commonly known as: LIPITOR Take 10 mg by mouth every evening.   calcium carbonate 1500 (600 Ca) MG Tabs tablet Commonly known as: OSCAL Take by mouth 2 (two) times daily with a meal.   cholecalciferol 1000 units tablet Commonly known as: VITAMIN D Take 1,000 Units by mouth daily.   flecainide 150 MG tablet Commonly known as: TAMBOCOR TAKE HALF A TABLET (75 MG TOTAL) BY MOUTH 2 (TWO) TIMES DAILY.   KRILL OIL PO Take by mouth.   levothyroxine 112 MCG tablet Commonly known as: SYNTHROID Take 112 mcg by mouth daily before  breakfast.   lisinopril 20 MG tablet Commonly known as: ZESTRIL Take 1 tablet (20 mg total) by mouth 2 (two) times daily.   meclizine 25 MG tablet Commonly known as: ANTIVERT Take 25 mg by mouth 3 (three) times daily as needed for dizziness.   metoprolol succinate 25 MG 24 hr tablet Commonly known as: Toprol XL Take 1 tablet (25 mg total) by mouth 2 (two) times daily. Take 2-3 Times Daily as Directed. What changed:  how much to take how to take this when to take this   omeprazole 20 MG capsule Commonly known as: PRILOSEC Take 20 mg by mouth at bedtime.   Polyethyl Glycol-Propyl Glycol 0.4-0.3 % Soln Place 1 drop into both eyes 3 (three) times daily as needed (for dry eyes.).   polyethylene glycol 17 g packet Commonly known as: MIRALAX / GLYCOLAX Take 17 g by mouth daily as needed (for constipation.).   PRESERVISION AREDS PO Take 1 tablet by mouth daily.   simethicone 125 MG chewable tablet Commonly known as: MYLICON Chew 382-505 mg by mouth every 6 (six) hours as needed for flatulence.               Discharge Care Instructions  (From admission, onward)           Start     Ordered   08/15/22 0000  Discharge wound care:       Comments: Reinforce dressing   08/15/22 1536  Discharge Assessment: Vitals:   08/15/22 0339 08/15/22 1530  BP: 130/63 131/67  Pulse: 66 61  Resp: 20 18  Temp: 98.5 F (36.9 C) 97.6 F (36.4 C)  SpO2: 96% 98%   Skin clean, dry and intact without evidence of skin break down, no evidence of skin tears noted. IV catheter discontinued intact. Site without signs and symptoms of complications - no redness or edema noted at insertion site, patient denies c/o pain - only slight tenderness at site.  Dressing with slight pressure applied.  D/c Instructions-Education: Discharge instructions given to patient/family with verbalized understanding. D/c education completed with patient/family including follow up instructions,  medication list, d/c activities limitations if indicated, with other d/c instructions as indicated by MD - patient able to verbalize understanding, all questions fully answered. Patient instructed to return to ED, call 911, or call MD for any changes in condition.  Patient escorted via stretcher and D/C to SNF via staff.  Tsosie Billing, LPN 62/44/6950 7:22 PM

## 2022-08-15 NOTE — TOC Transition Note (Addendum)
Transition of Care [facility]) - CM/SW Discharge Note   Patient Details  Name: Jessica Myers MRN: [id number] Date of Birth: 1929/03/25  [facility] CM/SW Contact:  Boneta Lucks, RN Phone Number: 08/15/2022, 11:20 AM   Clinical Narrative:   [facility] offered a bed. Patient first choice and accepting bed offer. INS Auth started this morning. DC summary sent to [facility].   Addendum : INS Auth received # [id number].  RN/MD updated waiting on COVID test to result.     Final next level of care: Skilled Nursing Facility Barriers to Discharge: Insurance Authorization   Patient Goals and CMS Choice Patient states their goals for this hospitalization and ongoing recovery are:: agreeable to go to [facility]. CMS Medicare.gov Compare Post Acute Care list provided to:: Patient Choice offered to / list presented to : Patient  Discharge Placement              Patient chooses bed at:  [facility]) Patient to be transferred to facility by: [facility] staff   Patient and family notified of of transfer: 08/15/22  Discharge Plan and Services In-house Referral: Clinical Social Work                Readmission Risk Interventions    08/15/2022   11:18 AM  Readmission Risk Prevention Plan  Post Dischage Appt Complete  Medication Screening Complete  Transportation Screening Complete

## 2022-08-16 ENCOUNTER — Encounter (HOSPITAL_COMMUNITY): Payer: Self-pay | Admitting: Orthopedic Surgery

## 2022-08-16 ENCOUNTER — Non-Acute Institutional Stay (SKILLED_NURSING_FACILITY): Payer: PPO | Admitting: Adult Health

## 2022-08-16 DIAGNOSIS — Z8673 Personal history of transient ischemic attack (TIA), and cerebral infarction without residual deficits: Secondary | ICD-10-CM | POA: Diagnosis not present

## 2022-08-16 DIAGNOSIS — I700 Atherosclerosis of aorta: Secondary | ICD-10-CM

## 2022-08-16 DIAGNOSIS — K219 Gastro-esophageal reflux disease without esophagitis: Secondary | ICD-10-CM | POA: Diagnosis not present

## 2022-08-16 DIAGNOSIS — E785 Hyperlipidemia, unspecified: Secondary | ICD-10-CM

## 2022-08-16 DIAGNOSIS — K5909 Other constipation: Secondary | ICD-10-CM

## 2022-08-16 DIAGNOSIS — E039 Hypothyroidism, unspecified: Secondary | ICD-10-CM

## 2022-08-16 DIAGNOSIS — I4819 Other persistent atrial fibrillation: Secondary | ICD-10-CM

## 2022-08-16 DIAGNOSIS — N1831 Chronic kidney disease, stage 3a: Secondary | ICD-10-CM | POA: Diagnosis not present

## 2022-08-16 DIAGNOSIS — D696 Thrombocytopenia, unspecified: Secondary | ICD-10-CM | POA: Diagnosis not present

## 2022-08-16 DIAGNOSIS — I10 Essential (primary) hypertension: Secondary | ICD-10-CM | POA: Diagnosis not present

## 2022-08-16 DIAGNOSIS — S72001A Fracture of unspecified part of neck of right femur, initial encounter for closed fracture: Secondary | ICD-10-CM | POA: Diagnosis not present

## 2022-08-17 ENCOUNTER — Encounter: Payer: Self-pay | Admitting: Internal Medicine

## 2022-08-17 ENCOUNTER — Non-Acute Institutional Stay (SKILLED_NURSING_FACILITY): Payer: PPO | Admitting: Internal Medicine

## 2022-08-17 ENCOUNTER — Encounter: Payer: Self-pay | Admitting: Adult Health

## 2022-08-17 DIAGNOSIS — E440 Moderate protein-calorie malnutrition: Secondary | ICD-10-CM

## 2022-08-17 DIAGNOSIS — R4189 Other symptoms and signs involving cognitive functions and awareness: Secondary | ICD-10-CM | POA: Diagnosis not present

## 2022-08-17 DIAGNOSIS — M7989 Other specified soft tissue disorders: Secondary | ICD-10-CM | POA: Diagnosis not present

## 2022-08-17 DIAGNOSIS — R29818 Other symptoms and signs involving the nervous system: Secondary | ICD-10-CM

## 2022-08-17 DIAGNOSIS — I4819 Other persistent atrial fibrillation: Secondary | ICD-10-CM | POA: Diagnosis not present

## 2022-08-17 DIAGNOSIS — S72001A Fracture of unspecified part of neck of right femur, initial encounter for closed fracture: Secondary | ICD-10-CM

## 2022-08-17 NOTE — Progress Notes (Unsigned)
Location:  [place] Room Number: 131 Place of Service:  SNF (31)   CODE STATUS: full   Allergies  Allergen Reactions   Biaxin [Clarithromycin] Shortness Of Breath and Rash    Unknown   Penicillins Hives    Has patient had a PCN reaction causing immediate rash, facial/tongue/throat swelling, SOB or lightheadedness with hypotension: Yes Has patient had a PCN reaction causing severe rash involving mucus membranes or skin necrosis: No Has patient had a PCN reaction that required hospitalization No Has patient had a PCN reaction occurring within the last 10 years: No If all of the above answers are "NO", then may proceed with Cephalosporin use. ANCEF on 08/11/22 without issue     Shellfish Allergy Nausea And Vomiting   Sulfa Antibiotics Other (See Comments)    "bad reaction," per patient   Celebrex [Celecoxib] Rash    Chief Complaint  Patient presents with   Hospitalization Follow-up    HPI:  She is a 88 year old woman who has been hospitalized from 08-09-22 through 08-15-22. Her medical history includes; atrial fibrillation; pace maker; hypothyroidism. She had a fall after she tripped on her rug. She was admitted to the hospital for her right hip fracture. On 08-11-22 she had an ORIF on her right hip. She is here for short term rehab with her goal to return back home. She denies any pain at this time. She is taking routine tylenol for her pain. There are no reports of changes in appetite. She will continue to be followed for her chronic illnesses including: Persistent atrial fibrillation;  Essential hypertension:. Gastroesophageal reflux disease without esophageal  Acquired hypothyroidism   Past Medical History:  Diagnosis Date   A-fib [facility])    Anxiety    Bradycardia    s/p PPM October 2014   Chronic anticoagulation    Dysrhythmia    Headache(784.0)    High cholesterol    Hypertension    Malignant neoplasm of upper-outer quadrant of right breast  in female, estrogen receptor positive ([place]) 06/15/2018   Multiple rib fractures 07/01/2016   fell down stairs    Pulmonary contusion 06/2016   from a fall    Stroke [facility]) September 2014   thrombolytic therapy   Thyroid disease    Vertical vertigo     Past Surgical History:  Procedure Laterality Date   BREAST LUMPECTOMY WITH RADIOACTIVE SEED LOCALIZATION Right 07/13/2018   Procedure: BREAST LUMPECTOMY WITH RADIOACTIVE SEED LOCALIZATION;  Surgeon: Rolm Bookbinder, MD;  Location: [place];  Service: General;  Laterality: Right;   CHOLECYSTECTOMY     INSERT / REPLACE / REMOVE PACEMAKER     ORIF HIP FRACTURE Right 08/11/2022   Procedure: OPEN REDUCTION INTERNAL FIXATION HIP;  Surgeon: Mordecai Rasmussen, MD;  Location: [facility];  Service: Orthopedics;  Laterality: Right;   PACEMAKER INSERTION     PERMANENT PACEMAKER INSERTION N/A 07/25/2013   Procedure: PERMANENT PACEMAKER INSERTION;  Surgeon: Deboraha Sprang, MD;  Location: [facility] CATH LAB;  Service: Cardiovascular;  Laterality: N/A;   TEE WITHOUT CARDIOVERSION N/A 07/16/2013   Procedure: TRANSESOPHAGEAL ECHOCARDIOGRAM (TEE);  Surgeon: Thayer Headings, MD;  Location: [facility] ENDOSCOPY;  Service: Cardiovascular;  Laterality: N/A;    Social History   Socioeconomic History   Marital status: Married    Spouse name: leroy   Number of children: 2   Years of education: 12   Highest education level: Not on file  Occupational History   Occupation: retired  Tobacco  Use   Smoking status: Never   Smokeless tobacco: Never  Vaping Use   Vaping Use: Never used  Substance and Sexual Activity   Alcohol use: No    Alcohol/week: 0.0 standard drinks of alcohol   Drug use: No   Sexual activity: Never  Other Topics Concern   Not on file  Social History Narrative   Patient lives at home with her husband.   Social Determinants of Health   Financial Resource Strain: Not on file  Food Insecurity: No Food Insecurity (08/10/2022)   Hunger Vital Sign    Worried  About Running Out of Food in the Last Year: Never true    Ran Out of Food in the Last Year: Never true  Transportation Needs: No Transportation Needs (08/10/2022)   PRAPARE - Hydrologist (Medical): No    Lack of Transportation (Non-Medical): No  Physical Activity: Not on file  Stress: Not on file  Social Connections: Not on file  Intimate Partner Violence: Not At Risk (08/10/2022)   Humiliation, Afraid, Rape, and Kick questionnaire    Fear of Current or Ex-Partner: No    Emotionally Abused: No    Physically Abused: No    Sexually Abused: No   Family History  Problem Relation Age of Onset   Cancer Mother    Breast cancer Mother    Cancer Father    Lung cancer Father       VITAL SIGNS BP (!) 104/57   Pulse 69   Temp (!) 97.3 F (36.3 C)   Resp 20   Ht '5\' 4"'$  (1.626 m)   Wt 124 lb 1.6 oz (56.3 kg)   SpO2 97%   BMI 21.30 kg/m   Outpatient Encounter Medications as of 08/16/2022  Medication Sig   Acetaminophen 325 MG CAPS Take 650 mg by mouth every 6 (six) hours.   apixaban (ELIQUIS) 2.5 MG TABS tablet Take 1 tablet (2.5 mg total) by mouth 2 (two) times daily.   atorvastatin (LIPITOR) 10 MG tablet Take 10 mg by mouth every evening.    calcium carbonate (OSCAL) 1500 (600 Ca) MG TABS tablet Take by mouth 2 (two) times daily with a meal.   cholecalciferol (VITAMIN D) 1000 UNITS tablet Take 1,000 Units by mouth daily.    flecainide (TAMBOCOR) 150 MG tablet TAKE HALF A TABLET (75 MG TOTAL) BY MOUTH 2 (TWO) TIMES DAILY.   KRILL OIL PO Take by mouth.   levothyroxine (SYNTHROID, LEVOTHROID) 112 MCG tablet Take 112 mcg by mouth daily before breakfast.   lisinopril (ZESTRIL) 20 MG tablet Take 1 tablet (20 mg total) by mouth 2 (two) times daily.   meclizine (ANTIVERT) 25 MG tablet Take 25 mg by mouth 3 (three) times daily as needed for dizziness.   metoprolol succinate (TOPROL XL) 25 MG 24 hr tablet Take 1 tablet (25 mg total) by mouth 2 (two) times daily.  Take 2 Times Daily as Directed.   Multiple Vitamins-Minerals (PRESERVISION AREDS PO) Take 1 tablet by mouth daily.   omeprazole (PRILOSEC) 20 MG capsule Take 20 mg by mouth at bedtime.    Polyethyl Glycol-Propyl Glycol 0.4-0.3 % SOLN Place 1 drop into both eyes 3 (three) times daily as needed (for dry eyes.).   polyethylene glycol (MIRALAX / GLYCOLAX) packet Take 17 g by mouth daily as needed (for constipation.).    simethicone (MYLICON) 417 MG chewable tablet Chew 125-250 mg by mouth every 6 (six) hours as needed for flatulence.   [  DISCONTINUED] acetaminophen (TYLENOL) 500 MG tablet Take 500-1,000 mg by mouth every 6 (six) hours as needed for mild pain.   No facility-administered encounter medications on file as of 08/16/2022.     SIGNIFICANT DIAGNOSTIC EXAMS  TODAY  08-10-22: chest x-ray: Heart is enlarged the mediastinal contour stable. There is atherosclerotic calcification of the aorta. No consolidation, effusion, or pneumothorax. A dual lead pacemaker is present over the left chest. No acute osseous abnormality.  LABS REVIEWED TODAY  08-10-22: wbc 7.8; hgb 11.7; hct 36.1; mcv 96.0 plt 126; glucose 95; bun 23; creat 1.09; k+ 4.0; na++ 138;ca 9.2 gfr 48; protein 5,8 albumin 3,3 mag 1.8; phos 3.3 11-12-21: wbc 10.1 hgb 11.9; hct 36.2; mcv 94.5 plt 134; glucose 123; bun 21; creat 1.05 ;k+ 4.1; na++ 137; ca 9.1; gfr 50   Review of Systems  Constitutional:  Negative for malaise/fatigue.  Respiratory:  Negative for cough and shortness of breath.   Cardiovascular:  Negative for chest pain, palpitations and leg swelling.  Gastrointestinal:  Negative for abdominal pain, constipation and heartburn.  Musculoskeletal:  Negative for back pain, joint pain and myalgias.  Skin: Negative.   Neurological:  Negative for dizziness.  Psychiatric/Behavioral:  The patient is not nervous/anxious.    Physical Exam Constitutional:      General: She is not in acute distress.    Appearance: She is  well-developed. She is not diaphoretic.  Neck:     Thyroid: No thyromegaly.  Cardiovascular:     Rate and Rhythm: Normal rate and regular rhythm.     Pulses: Normal pulses.     Heart sounds: Normal heart sounds.  Pulmonary:     Effort: Pulmonary effort is normal. No respiratory distress.     Breath sounds: Normal breath sounds.  Abdominal:     General: Bowel sounds are normal. There is no distension.     Palpations: Abdomen is soft.     Tenderness: There is no abdominal tenderness.  Musculoskeletal:        General: Normal range of motion.     Cervical back: Neck supple.     Right lower leg: No edema.     Left lower leg: No edema.  Lymphadenopathy:     Cervical: No cervical adenopathy.  Skin:    General: Skin is warm and dry.     Comments: Incision line without signs of infection present   Neurological:     Mental Status: She is alert and oriented to person, place, and time. Mental status is at baseline.  Psychiatric:        Mood and Affect: Mood normal.       ASSESSMENT/ PLAN:  TODAY  Persistent atrial fibrillation; is status post pace maker; will continue tambocor 75 mg twice daily toprol xl 25 mg twice daily for rate control eliquis 2.5 mg twice daily  2. Essential hypertension: b/p 104/57 will continue lisinopril 20 mg twice daily; toprol xl 25 mg twice daily if her blood pressures remain soft will need to lower her medications.   3. Gastroesophageal reflux disease without esophageal will continue prilosec 20 mg daily  4. Acquired hypothyroidism: will continue synthroid 112 mcg daily   5. Closed displaced fracture of right femoral neck: is status post ORIF 08-11-22; will continue therapy as directed and will follow up with orthopedics; will continue tylenol 650 mg every 6 hours   6. Thrombocytopenia: plt is 134  7. Hx CVA is stable will monitor   8. Hyperlipidemia ldl goal <100: will continue  lipitor 10 mg daily   9. Chronic constipation: will continue miralax  daily as needed  10. Aortic atherosclerosis: is on statin  11. Stage 3a chronic kidney disease: bun 21; creat 1.05; gfr 50   Will check cbc; bmp     Ok Edwards NP [facility] Adult [facility]  call [phone]

## 2022-08-17 NOTE — Patient Instructions (Signed)
See assessment and plan under each diagnosis in the problem list and acutely for this visit 

## 2022-08-17 NOTE — Progress Notes (Unsigned)
   NURSING HOME LOCATION:  [facility] ROOM NUMBER:  [id number]  CODE STATUS:  Full Code  PCP:   Monico Blitz MD  This is a comprehensive admission note to this SNFperformed on this date less than 30 days from date of admission. Included are preadmission medical/surgical history; reconciled medication list; family history; social history and comprehensive review of systems.  Corrections and additions to the records were documented. Comprehensive physical exam was also performed. Additionally a clinical summary was entered for each active diagnosis pertinent to this admission in the Problem List to enhance continuity of care.  HPI:  Past medical and surgical history:  Social history:  Family history:   Review of systems: Clinical neurocognitive deficits made validity of responses questionable & preventing ROS completion. Date given as Constitutional: No fever, significant weight change, fatigue  Eyes: No redness, discharge, pain, vision change ENT/mouth: No nasal congestion, purulent discharge, earache, change in hearing, sore throat  Cardiovascular: No chest pain, palpitations, paroxysmal nocturnal dyspnea, claudication, edema  Respiratory: No cough, sputum production, hemoptysis, DOE, significant snoring, apnea Gastrointestinal: No heartburn, dysphagia, abdominal pain, nausea /vomiting, rectal bleeding, melena, change in bowels Genitourinary: No dysuria, hematuria, pyuria, incontinence, nocturia Musculoskeletal: No joint stiffness, joint swelling, weakness, pain Dermatologic: No rash, pruritus, change in appearance of skin Neurologic: No dizziness, headache, syncope, seizures, numbness, tingling Psychiatric: No significant anxiety, depression, insomnia, anorexia Endocrine: No change in hair/skin/nails, excessive thirst, excessive hunger, excessive urination  Hematologic/lymphatic: No significant bruising, lymphadenopathy, abnormal bleeding Allergy/immunology: No  itchy/watery eyes, significant sneezing, urticaria, angioedema  Physical exam:  Pertinent or positive findings: General appearance: Adequately nourished; no acute distress, increased work of breathing is present.   Lymphatic: No lymphadenopathy about the head, neck, axilla. Eyes: No conjunctival inflammation or lid edema is present. There is no scleral icterus. Ears:  External ear exam shows no significant lesions or deformities.   Nose:  External nasal examination shows no deformity or inflammation. Nasal mucosa are pink and moist without lesions, exudates Oral exam: Lips and gums are healthy appearing.There is no oropharyngeal erythema or exudate. Neck:  No thyromegaly, masses, tenderness noted.    Heart:  Normal rate and regular rhythm. S1 and S2 normal without gallop, murmur, click, rub.  Lungs: Chest clear to auscultation without wheezes, rhonchi, rales, rubs. Abdomen: Bowel sounds are normal.  Abdomen is soft and nontender with no organomegaly, hernias, masses. GU: Deferred  Extremities:  No cyanosis, clubbing, edema. Neurologic exam:  Strength equal  in upper & lower extremities. Balance, Rhomberg, finger to nose testing could not be completed due to clinical state Deep tendon reflexes are equal Skin: Warm & dry w/o tenting. No significant lesions or rash.  See clinical summary under each active problem in the Problem List with associated updated therapeutic plan

## 2022-08-18 DIAGNOSIS — N1831 Chronic kidney disease, stage 3a: Secondary | ICD-10-CM | POA: Insufficient documentation

## 2022-08-18 DIAGNOSIS — R29818 Other symptoms and signs involving the nervous system: Secondary | ICD-10-CM | POA: Insufficient documentation

## 2022-08-18 DIAGNOSIS — I700 Atherosclerosis of aorta: Secondary | ICD-10-CM | POA: Insufficient documentation

## 2022-08-18 DIAGNOSIS — E785 Hyperlipidemia, unspecified: Secondary | ICD-10-CM | POA: Insufficient documentation

## 2022-08-18 DIAGNOSIS — E46 Unspecified protein-calorie malnutrition: Secondary | ICD-10-CM | POA: Insufficient documentation

## 2022-08-18 DIAGNOSIS — K5909 Other constipation: Secondary | ICD-10-CM | POA: Insufficient documentation

## 2022-08-18 NOTE — Assessment & Plan Note (Addendum)
PT/OT at SNF as tolerated. "Safety proofing " home discussed (Ex. Remove all throw rugs)

## 2022-08-18 NOTE — Assessment & Plan Note (Addendum)
History of pacemaker placement.  Rhythm is clinically regular.  Eliquis was held preoperatively but restarted at 2.5 mg twice daily.

## 2022-08-18 NOTE — Assessment & Plan Note (Addendum)
Asymmetric lower extremity edema evaluated with ultrasound which revealed no DVT. Eliquis restarted post ORIF 10/26.

## 2022-08-18 NOTE — Assessment & Plan Note (Addendum)
08/17/2022 she initially stated it was April but corrected this to October.  She could not name her orthopedic surgeon.  She ask "how long ago was 2017. 4 years?"  She has a past history of CVA; there are no CNS imaging reports in Epic.to assess signs of vascular dementia

## 2022-08-18 NOTE — Assessment & Plan Note (Addendum)
Albumin 3.3 and total protein 5.8.  Limb atrophy and interosseous wasting on exam. Nutrition consult at SNF.

## 2022-08-22 ENCOUNTER — Non-Acute Institutional Stay (SKILLED_NURSING_FACILITY): Payer: PPO | Admitting: Adult Health

## 2022-08-22 ENCOUNTER — Encounter: Payer: Self-pay | Admitting: Adult Health

## 2022-08-22 ENCOUNTER — Other Ambulatory Visit (HOSPITAL_COMMUNITY)
Admission: RE | Admit: 2022-08-22 | Discharge: 2022-08-22 | Disposition: A | Discharge status: Home / Self Care | Payer: PPO | Source: Skilled Nursing Facility | Attending: Adult Health | Admitting: Adult Health

## 2022-08-22 DIAGNOSIS — I10 Essential (primary) hypertension: Secondary | ICD-10-CM | POA: Insufficient documentation

## 2022-08-22 DIAGNOSIS — N1831 Chronic kidney disease, stage 3a: Secondary | ICD-10-CM

## 2022-08-22 LAB — CBC
HCT: 34.7 % — ABNORMAL LOW (ref 36.0–46.0)
Hemoglobin: 11.4 g/dL — ABNORMAL LOW (ref 12.0–15.0)
MCH: 31.2 pg (ref 26.0–34.0)
MCHC: 32.9 g/dL (ref 30.0–36.0)
MCV: 95.1 fL (ref 80.0–100.0)
Platelets: 255 10*3/uL (ref 150–400)
RBC: 3.65 MIL/uL — ABNORMAL LOW (ref 3.87–5.11)
RDW: 12 % (ref 11.5–15.5)
WBC: 6.2 10*3/uL (ref 4.0–10.5)
nRBC: 0 % (ref 0.0–0.2)

## 2022-08-22 LAB — BASIC METABOLIC PANEL
Anion gap: 7 (ref 5–15)
BUN: 38 mg/dL — ABNORMAL HIGH (ref 8–23)
CO2: 26 mmol/L (ref 22–32)
Calcium: 9.4 mg/dL (ref 8.9–10.3)
Chloride: 104 mmol/L (ref 98–111)
Creatinine, Ser: 1.26 mg/dL — ABNORMAL HIGH (ref 0.44–1.00)
GFR, Estimated: 40 mL/min — ABNORMAL LOW (ref >60)
Glucose, Bld: 87 mg/dL (ref 70–99)
Potassium: 4.3 mmol/L (ref 3.5–5.1)
Sodium: 137 mmol/L (ref 135–145)

## 2022-08-22 NOTE — Progress Notes (Unsigned)
Location:  [place] Room Number: 131 Place of Service:  SNF (31)   CODE STATUS: full   Allergies  Allergen Reactions   Biaxin [Clarithromycin] Shortness Of Breath and Rash    Unknown   Penicillins Hives    Has patient had a PCN reaction causing immediate rash, facial/tongue/throat swelling, SOB or lightheadedness with hypotension: Yes Has patient had a PCN reaction causing severe rash involving mucus membranes or skin necrosis: No Has patient had a PCN reaction that required hospitalization No Has patient had a PCN reaction occurring within the last 10 years: No If all of the above answers are "NO", then may proceed with Cephalosporin use. ANCEF on 08/11/22 without issue     Shellfish Allergy Nausea And Vomiting   Sulfa Antibiotics Other (See Comments)    "bad reaction," per patient   Celebrex [Celecoxib] Rash    Chief Complaint  Patient presents with   Acute Visit    Follow up lab work     HPI:  Her renal function is slightly worse. Her creatinine is 1.26 previous 1.05 she is taking lisinopril 20 mg twice daily. We have discussed her medications at length. She does understand that we need to lower her lisinopril dose to one time daily. We have discussed her eliquis; she needs her dosing at 2.5 mg twice daily due to her body weight.   Past Medical History:  Diagnosis Date   A-fib [facility])    Anxiety    Bradycardia    s/p PPM October 2014   Chronic anticoagulation    Dysrhythmia    Headache(784.0)    High cholesterol    Hypertension    Malignant neoplasm of upper-outer quadrant of right breast in female, estrogen receptor positive ([place]) 06/15/2018   Multiple rib fractures 07/01/2016   fell down stairs    Pulmonary contusion 06/2016   from a fall    Stroke [facility]) September 2014   thrombolytic therapy   Thyroid disease    Vertical vertigo     Past Surgical History:  Procedure Laterality Date   BREAST LUMPECTOMY WITH RADIOACTIVE SEED  LOCALIZATION Right 07/13/2018   Procedure: BREAST LUMPECTOMY WITH RADIOACTIVE SEED LOCALIZATION;  Surgeon: Rolm Bookbinder, MD;  Location: [place];  Service: General;  Laterality: Right;   CHOLECYSTECTOMY     INSERT / REPLACE / REMOVE PACEMAKER     ORIF HIP FRACTURE Right 08/11/2022   Procedure: OPEN REDUCTION INTERNAL FIXATION HIP;  Surgeon: Mordecai Rasmussen, MD;  Location: [facility];  Service: Orthopedics;  Laterality: Right;   PACEMAKER INSERTION     PERMANENT PACEMAKER INSERTION N/A 07/25/2013   Procedure: PERMANENT PACEMAKER INSERTION;  Surgeon: Deboraha Sprang, MD;  Location: [facility] CATH LAB;  Service: Cardiovascular;  Laterality: N/A;   TEE WITHOUT CARDIOVERSION N/A 07/16/2013   Procedure: TRANSESOPHAGEAL ECHOCARDIOGRAM (TEE);  Surgeon: Thayer Headings, MD;  Location: [facility] ENDOSCOPY;  Service: Cardiovascular;  Laterality: N/A;    Social History   Socioeconomic History   Marital status: Married    Spouse name: leroy   Number of children: 2   Years of education: 12   Highest education level: Not on file  Occupational History   Occupation: retired  Tobacco Use   Smoking status: Never   Smokeless tobacco: Never  Vaping Use   Vaping Use: Never used  Substance and Sexual Activity   Alcohol use: No    Alcohol/week: 0.0 standard drinks of alcohol   Drug use: No   Sexual activity:  Never  Other Topics Concern   Not on file  Social History Narrative   Patient lives at home with her husband.   Social Determinants of Health   Financial Resource Strain: Not on file  Food Insecurity: No Food Insecurity (08/10/2022)   Hunger Vital Sign    Worried About Running Out of Food in the Last Year: Never true    Ran Out of Food in the Last Year: Never true  Transportation Needs: No Transportation Needs (08/10/2022)   PRAPARE - Hydrologist (Medical): No    Lack of Transportation (Non-Medical): No  Physical Activity: Not on file  Stress: Not on file  Social Connections:  Not on file  Intimate Partner Violence: Not At Risk (08/10/2022)   Humiliation, Afraid, Rape, and Kick questionnaire    Fear of Current or Ex-Partner: No    Emotionally Abused: No    Physically Abused: No    Sexually Abused: No   Family History  Problem Relation Age of Onset   Cancer Mother    Breast cancer Mother    Cancer Father    Lung cancer Father       VITAL SIGNS BP (!) 102/58   Pulse 60   Temp (!) 97.1 F (36.2 C)   Resp 18   Ht '5\' 4"'$  (1.626 m)   Wt 124 lb 9.6 oz (56.5 kg)   SpO2 99%   BMI 21.39 kg/m   Outpatient Encounter Medications as of 08/22/2022  Medication Sig   Acetaminophen 325 MG CAPS Take 650 mg by mouth every 6 (six) hours.   apixaban (ELIQUIS) 2.5 MG TABS tablet Take 1 tablet (2.5 mg total) by mouth 2 (two) times daily.   atorvastatin (LIPITOR) 10 MG tablet Take 10 mg by mouth every evening.    calcium carbonate (OSCAL) 1500 (600 Ca) MG TABS tablet Take by mouth 2 (two) times daily with a meal.   cholecalciferol (VITAMIN D) 1000 UNITS tablet Take 1,000 Units by mouth daily.    flecainide (TAMBOCOR) 150 MG tablet TAKE HALF A TABLET (75 MG TOTAL) BY MOUTH 2 (TWO) TIMES DAILY.   KRILL OIL PO Take by mouth.   levothyroxine (SYNTHROID, LEVOTHROID) 112 MCG tablet Take 112 mcg by mouth daily before breakfast.   lisinopril (ZESTRIL) 20 MG tablet Take 1 tablet (20 mg total) by mouth 2 (two) times daily.   meclizine (ANTIVERT) 25 MG tablet Take 25 mg by mouth 3 (three) times daily as needed for dizziness.   metoprolol succinate (TOPROL XL) 25 MG 24 hr tablet Take 1 tablet (25 mg total) by mouth 2 (two) times daily. Take 2-3 Times Daily as Directed. (Patient taking differently: Take 25 mg by mouth 2 (two) times daily.)   Multiple Vitamins-Minerals (PRESERVISION AREDS PO) Take 1 tablet by mouth daily.   omeprazole (PRILOSEC) 20 MG capsule Take 20 mg by mouth at bedtime.    Polyethyl Glycol-Propyl Glycol 0.4-0.3 % SOLN Place 1 drop into both eyes 3 (three) times  daily as needed (for dry eyes.).   polyethylene glycol (MIRALAX / GLYCOLAX) packet Take 17 g by mouth daily as needed (for constipation.).    simethicone (MYLICON) 557 MG chewable tablet Chew 125-250 mg by mouth every 6 (six) hours as needed for flatulence.   No facility-administered encounter medications on file as of 08/22/2022.     SIGNIFICANT DIAGNOSTIC EXAMS  PREVIOUS   08-10-22: chest x-ray: Heart is enlarged the mediastinal contour stable. There is atherosclerotic calcification of the  aorta. No consolidation, effusion, or pneumothorax. A dual lead pacemaker is present over the left chest. No acute osseous abnormality.  NO NEW EXAMS   LABS REVIEWED PREVIOUS   08-10-22: wbc 7.8; hgb 11.7; hct 36.1; mcv 96.0 plt 126; glucose 95; bun 23; creat 1.09; k+ 4.0; na++ 138;ca 9.2 gfr 48; protein 5,8 albumin 3,3 mag 1.8; phos 3.3 08-12-22: wbc 10.1 hgb 11.9; hct 36.2; mcv 94.5 plt 134; glucose 123; bun 21; creat 1.05 ;k+ 4.1; na++ 137; ca 9.1; gfr 50   TODAY  08-22-22: wbc 6.2; hgb 11.4; hct 34.7; mcv 95.1 plt 255; glucose 87; bun 38;creat 1.26; k+ 4.3; na++ 137; ca 9.4 gfr 40   Review of Systems  Constitutional:  Negative for malaise/fatigue.  Respiratory:  Negative for cough and shortness of breath.   Cardiovascular:  Negative for chest pain, palpitations and leg swelling.  Gastrointestinal:  Negative for abdominal pain, constipation and heartburn.  Musculoskeletal:  Negative for back pain, joint pain and myalgias.  Skin: Negative.   Neurological:  Negative for dizziness.  Psychiatric/Behavioral:  The patient is not nervous/anxious.     Physical Exam Constitutional:      General: She is not in acute distress.    Appearance: She is well-developed. She is not diaphoretic.  Neck:     Thyroid: No thyromegaly.  Cardiovascular:     Rate and Rhythm: Normal rate and regular rhythm.     Pulses: Normal pulses.     Heart sounds: Normal heart sounds.  Pulmonary:     Effort: Pulmonary  effort is normal. No respiratory distress.     Breath sounds: Normal breath sounds.  Abdominal:     General: Bowel sounds are normal. There is no distension.     Palpations: Abdomen is soft.     Tenderness: There is no abdominal tenderness.  Musculoskeletal:        General: Normal range of motion.     Cervical back: Neck supple.     Right lower leg: No edema.     Left lower leg: No edema.  Lymphadenopathy:     Cervical: No cervical adenopathy.  Skin:    General: Skin is warm and dry.  Neurological:     Mental Status: She is alert and oriented to person, place, and time.  Psychiatric:        Mood and Affect: Mood normal.      ASSESSMENT/ PLAN:  TODAY  Stage 3a chronic kidney disease: is worse; will lower her lisinopril to 20 mg daily will repeat bmp on 08-29-22.    Ok Edwards NP [facility] Adult Medicine   call [phone]

## 2022-08-26 ENCOUNTER — Ambulatory Visit (INDEPENDENT_AMBULATORY_CARE_PROVIDER_SITE_OTHER): Payer: PPO | Admitting: Orthopedic Surgery

## 2022-08-26 ENCOUNTER — Encounter: Payer: Self-pay | Admitting: Orthopedic Surgery

## 2022-08-26 ENCOUNTER — Non-Acute Institutional Stay (SKILLED_NURSING_FACILITY): Payer: PPO | Admitting: Adult Health

## 2022-08-26 ENCOUNTER — Encounter: Payer: Self-pay | Admitting: Adult Health

## 2022-08-26 ENCOUNTER — Ambulatory Visit (INDEPENDENT_AMBULATORY_CARE_PROVIDER_SITE_OTHER): Payer: PPO

## 2022-08-26 VITALS — Ht 64.0 in | Wt 124.0 lb

## 2022-08-26 DIAGNOSIS — I700 Atherosclerosis of aorta: Secondary | ICD-10-CM | POA: Diagnosis not present

## 2022-08-26 DIAGNOSIS — S72001A Fracture of unspecified part of neck of right femur, initial encounter for closed fracture: Secondary | ICD-10-CM

## 2022-08-26 DIAGNOSIS — N1831 Chronic kidney disease, stage 3a: Secondary | ICD-10-CM | POA: Diagnosis not present

## 2022-08-26 DIAGNOSIS — I4819 Other persistent atrial fibrillation: Secondary | ICD-10-CM

## 2022-08-26 NOTE — Progress Notes (Signed)
POST VISIT 1  Chief Complaint  Patient presents with   Routine Post Op    Rt hip DOS 08/11/22     Encounter Diagnosis  Name Primary?   Closed displaced fracture of right femoral neck (HCC) Yes     Brief/Interim Summary: 88 year old female who unfortunately suffered a fall at home after she tripped on her rug, admitted to the hospital with right hip fracture.  She has a history of paroxysmal atrial fibrillation on anticoagulation.  Eliquis was held on admission for anticipated surgery.  Heart rate is currently in sinus rhythm.  She was seen by orthopedics and she underwent operative management on 10/26.  She is currently stable for skilled nursing facility placement once arrangements can be made    Diagnoses:  Non displaced right femoral neck fracture   Procedure: CRPP of the Right femoral neck fracture   Operative Finding Successful completion of the planned procedure.  Placement of 3 x 7.0 mm cannulated screws with washers to stabilize the Non displaced femoral neck fracture  She is doing well  No complaints   Xrays and incx look good   Xrays in 4 weeks

## 2022-08-26 NOTE — Progress Notes (Signed)
Location:  [place] Room Number: 131 Place of Service:  SNF (31)   CODE STATUS: full   Allergies  Allergen Reactions   Biaxin [Clarithromycin] Shortness Of Breath and Rash    Unknown   Penicillins Hives    Has patient had a PCN reaction causing immediate rash, facial/tongue/throat swelling, SOB or lightheadedness with hypotension: Yes Has patient had a PCN reaction causing severe rash involving mucus membranes or skin necrosis: No Has patient had a PCN reaction that required hospitalization No Has patient had a PCN reaction occurring within the last 10 years: No If all of the above answers are "NO", then may proceed with Cephalosporin use. ANCEF on 08/11/22 without issue     Shellfish Allergy Nausea And Vomiting   Sulfa Antibiotics Other (See Comments)    "bad reaction," per patient   Celebrex [Celecoxib] Rash    Chief Complaint  Patient presents with   Acute Visit    Care plan meeting    HPI:  We have come together for her care plan meeting family present . BIMS 15/15 mood 3/30: trouble concentrating; nervous at times SLUMS 16/30. Nonambulatory with no falls. She requires minimal assist with her adls. She is frequently incontinent of bladder and bowel. Dietary: 124 pounds; NAS appetite good appetite feeds self. Therapy: bed mobility supervision with rail; transfers: supervision; ambulate 200 feet supervision; 10 stairs with rail and supervision; upper body setup lower body contact guard .ST for cognition; 80 % accuracy with medications.  Activities: no participation . She continues to be followed for her chronic illnesses including: Aortic atherosclerosis  Persistent atrial fibrillation   Stage 3a chronic kidney disease.  Will discharge her to home next week.   Past Medical History:  Diagnosis Date   A-fib [facility])    Anxiety    Bradycardia    s/p PPM October 2014   Chronic anticoagulation    Dysrhythmia    Headache(784.0)    High cholesterol     Hypertension    Malignant neoplasm of upper-outer quadrant of right breast in female, estrogen receptor positive ([place]) 06/15/2018   Multiple rib fractures 07/01/2016   fell down stairs    Pulmonary contusion 06/2016   from a fall    Stroke [facility]) September 2014   thrombolytic therapy   Thyroid disease    Vertical vertigo     Past Surgical History:  Procedure Laterality Date   BREAST LUMPECTOMY WITH RADIOACTIVE SEED LOCALIZATION Right 07/13/2018   Procedure: BREAST LUMPECTOMY WITH RADIOACTIVE SEED LOCALIZATION;  Surgeon: Rolm Bookbinder, MD;  Location: [place];  Service: General;  Laterality: Right;   CHOLECYSTECTOMY     INSERT / REPLACE / REMOVE PACEMAKER     ORIF HIP FRACTURE Right 08/11/2022   Procedure: OPEN REDUCTION INTERNAL FIXATION HIP;  Surgeon: Mordecai Rasmussen, MD;  [facility];  Service: Orthopedics;  Laterality: Right;   PACEMAKER INSERTION     PERMANENT PACEMAKER INSERTION N/A 07/25/2013   Procedure: PERMANENT PACEMAKER INSERTION;  Surgeon: Deboraha Sprang, MD;  Location: [facility] CATH LAB;  Service: Cardiovascular;  Laterality: N/A;   TEE WITHOUT CARDIOVERSION N/A 07/16/2013   Procedure: TRANSESOPHAGEAL ECHOCARDIOGRAM (TEE);  Surgeon: Thayer Headings, MD;  Location: [facility] ENDOSCOPY;  Service: Cardiovascular;  Laterality: N/A;    Social History   Socioeconomic History   Marital status: Married    Spouse name: leroy   Number of children: 2   Years of education: 12   Highest education level: Not on file  Occupational History   Occupation: retired  Tobacco Use   Smoking status: Never   Smokeless tobacco: Never  Vaping Use   Vaping Use: Never used  Substance and Sexual Activity   Alcohol use: No    Alcohol/week: 0.0 standard drinks of alcohol   Drug use: No   Sexual activity: Never  Other Topics Concern   Not on file  Social History Narrative   Patient lives at home with her husband.   Social Determinants of Health   Financial Resource Strain: Not on file  Food  Insecurity: No Food Insecurity (08/10/2022)   Hunger Vital Sign    Worried About Running Out of Food in the Last Year: Never true    Ran Out of Food in the Last Year: Never true  Transportation Needs: No Transportation Needs (08/10/2022)   PRAPARE - Hydrologist (Medical): No    Lack of Transportation (Non-Medical): No  Physical Activity: Not on file  Stress: Not on file  Social Connections: Not on file  Intimate Partner Violence: Not At Risk (08/10/2022)   Humiliation, Afraid, Rape, and Kick questionnaire    Fear of Current or Ex-Partner: No    Emotionally Abused: No    Physically Abused: No    Sexually Abused: No   Family History  Problem Relation Age of Onset   Cancer Mother    Breast cancer Mother    Cancer Father    Lung cancer Father       VITAL SIGNS BP (!) 129/58   Pulse 65   Temp (!) 97.5 F (36.4 C)   Resp (!) 98   Ht '5\' 4"'$  (1.626 m)   Wt 124 lb 9.6 oz (56.5 kg)   BMI 21.39 kg/m   Outpatient Encounter Medications as of 08/26/2022  Medication Sig   Acetaminophen 325 MG CAPS Take 650 mg by mouth every 6 (six) hours.   apixaban (ELIQUIS) 2.5 MG TABS tablet Take 1 tablet (2.5 mg total) by mouth 2 (two) times daily.   atorvastatin (LIPITOR) 10 MG tablet Take 10 mg by mouth every evening.    calcium carbonate (OSCAL) 1500 (600 Ca) MG TABS tablet Take by mouth 2 (two) times daily with a meal.   cholecalciferol (VITAMIN D) 1000 UNITS tablet Take 1,000 Units by mouth daily.    flecainide (TAMBOCOR) 150 MG tablet TAKE HALF A TABLET (75 MG TOTAL) BY MOUTH 2 (TWO) TIMES DAILY.   KRILL OIL PO Take by mouth.   levothyroxine (SYNTHROID, LEVOTHROID) 112 MCG tablet Take 112 mcg by mouth daily before breakfast.   lisinopril (ZESTRIL) 20 MG tablet Take 20 mg by mouth daily.   meclizine (ANTIVERT) 25 MG tablet Take 25 mg by mouth 3 (three) times daily as needed for dizziness.   metoprolol succinate (TOPROL XL) 25 MG 24 hr tablet Take 1 tablet (25  mg total) by mouth 2 (two) times daily. Take 2-3 Times Daily as Directed. (Patient taking differently: Take 25 mg by mouth 2 (two) times daily.)   Multiple Vitamins-Minerals (PRESERVISION AREDS PO) Take 1 tablet by mouth daily.   omeprazole (PRILOSEC) 20 MG capsule Take 20 mg by mouth at bedtime.    Polyethyl Glycol-Propyl Glycol 0.4-0.3 % SOLN Place 1 drop into both eyes 3 (three) times daily as needed (for dry eyes.).   polyethylene glycol (MIRALAX / GLYCOLAX) packet Take 17 g by mouth daily as needed (for constipation.).    simethicone (MYLICON) 563 MG chewable tablet Chew 125-250 mg by  mouth every 6 (six) hours as needed for flatulence.   No facility-administered encounter medications on file as of 08/26/2022.     SIGNIFICANT DIAGNOSTIC EXAMS  PREVIOUS   08-10-22: chest x-ray: Heart is enlarged the mediastinal contour stable. There is atherosclerotic calcification of the aorta. No consolidation, effusion, or pneumothorax. A dual lead pacemaker is present over the left chest. No acute osseous abnormality.  NO NEW EXAMS   LABS REVIEWED PREVIOUS   08-10-22: wbc 7.8; hgb 11.7; hct 36.1; mcv 96.0 plt 126; glucose 95; bun 23; creat 1.09; k+ 4.0; na++ 138;ca 9.2 gfr 48; protein 5,8 albumin 3,3 mag 1.8; phos 3.3 08-12-22: wbc 10.1 hgb 11.9; hct 36.2; mcv 94.5 plt 134; glucose 123; bun 21; creat 1.05 ;k+ 4.1; na++ 137; ca 9.1; gfr 50  08-22-22: wbc 6.2; hgb 11.4; hct 34.7; mcv 95.1 plt 255; glucose 87; bun 38;creat 1.26; k+ 4.3; na++ 137; ca 9.4 gfr 40  NO NEW LABS.   Review of Systems  Constitutional:  Negative for malaise/fatigue.  Respiratory:  Negative for cough and shortness of breath.   Cardiovascular:  Negative for chest pain, palpitations and leg swelling.  Gastrointestinal:  Negative for abdominal pain, constipation and heartburn.  Musculoskeletal:  Negative for back pain, joint pain and myalgias.  Skin: Negative.   Neurological:  Negative for dizziness.  Psychiatric/Behavioral:   The patient is not nervous/anxious.     Physical Exam Constitutional:      General: She is not in acute distress.    Appearance: She is well-developed. She is not diaphoretic.  Neck:     Thyroid: No thyromegaly.  Cardiovascular:     Rate and Rhythm: Normal rate and regular rhythm.     Pulses: Normal pulses.     Heart sounds: Normal heart sounds.  Pulmonary:     Effort: Pulmonary effort is normal. No respiratory distress.     Breath sounds: Normal breath sounds.  Abdominal:     General: Bowel sounds are normal. There is no distension.     Palpations: Abdomen is soft.     Tenderness: There is no abdominal tenderness.  Musculoskeletal:        General: Normal range of motion.     Cervical back: Neck supple.     Right lower leg: No edema.     Left lower leg: No edema.  Lymphadenopathy:     Cervical: No cervical adenopathy.  Skin:    General: Skin is warm and dry.  Neurological:     Mental Status: She is alert. Mental status is at baseline.  Psychiatric:        Mood and Affect: Mood normal.      ASSESSMENT/ PLAN:  TODAY  Aortic atherosclerosis Persistent atrial fibrillation Stage 3a chronic kidney disease   Will continue current medications Will continue current plan of care Will continue to monitor her status.   Time spent with patient: 40 minutes: therapy dietary goals of care   Ok Edwards NP [facility]  call [phone]

## 2022-08-29 ENCOUNTER — Other Ambulatory Visit: Payer: Self-pay | Admitting: Adult Health

## 2022-08-29 ENCOUNTER — Other Ambulatory Visit (HOSPITAL_COMMUNITY)
Admission: RE | Admit: 2022-08-29 | Discharge: 2022-08-29 | Disposition: A | Discharge status: Home / Self Care | Payer: PPO | Source: Skilled Nursing Facility | Attending: Adult Health | Admitting: Adult Health

## 2022-08-29 ENCOUNTER — Encounter: Payer: Self-pay | Admitting: Adult Health

## 2022-08-29 ENCOUNTER — Non-Acute Institutional Stay (SKILLED_NURSING_FACILITY): Payer: PPO | Admitting: Adult Health

## 2022-08-29 DIAGNOSIS — I700 Atherosclerosis of aorta: Secondary | ICD-10-CM | POA: Diagnosis not present

## 2022-08-29 DIAGNOSIS — S72001A Fracture of unspecified part of neck of right femur, initial encounter for closed fracture: Secondary | ICD-10-CM

## 2022-08-29 DIAGNOSIS — R4189 Other symptoms and signs involving cognitive functions and awareness: Secondary | ICD-10-CM

## 2022-08-29 DIAGNOSIS — R29818 Other symptoms and signs involving the nervous system: Secondary | ICD-10-CM

## 2022-08-29 DIAGNOSIS — I4891 Unspecified atrial fibrillation: Secondary | ICD-10-CM

## 2022-08-29 DIAGNOSIS — N1831 Chronic kidney disease, stage 3a: Secondary | ICD-10-CM

## 2022-08-29 LAB — BASIC METABOLIC PANEL
Anion gap: 7 (ref 5–15)
BUN: 29 mg/dL — ABNORMAL HIGH (ref 8–23)
CO2: 26 mmol/L (ref 22–32)
Calcium: 9.4 mg/dL (ref 8.9–10.3)
Chloride: 107 mmol/L (ref 98–111)
Creatinine, Ser: 1.38 mg/dL — ABNORMAL HIGH (ref 0.44–1.00)
GFR, Estimated: 36 mL/min — ABNORMAL LOW (ref >60)
Glucose, Bld: 87 mg/dL (ref 70–99)
Potassium: 4.4 mmol/L (ref 3.5–5.1)
Sodium: 140 mmol/L (ref 135–145)

## 2022-08-29 MED ORDER — LEVOTHYROXINE SODIUM 112 MCG PO TABS: 112.0000 ug | ORAL_TABLET | Freq: Every day | ORAL | 0 refills | Status: AC

## 2022-08-29 MED ORDER — FLECAINIDE ACETATE 150 MG PO TABS: ORAL_TABLET | ORAL | 0 refills | Status: DC

## 2022-08-29 MED ORDER — METOPROLOL SUCCINATE ER 25 MG PO TB24: 25.0000 mg | ORAL_TABLET | Freq: Two times a day (BID) | ORAL | 0 refills | Status: DC

## 2022-08-29 MED ORDER — APIXABAN 2.5 MG PO TABS: 2.5000 mg | ORAL_TABLET | Freq: Two times a day (BID) | ORAL | 0 refills | Status: AC

## 2022-08-29 MED ORDER — ATORVASTATIN CALCIUM 10 MG PO TABS: 10.0000 mg | ORAL_TABLET | Freq: Every evening | ORAL | 0 refills | Status: AC

## 2022-08-29 MED ORDER — LISINOPRIL 20 MG PO TABS: 20.0000 mg | ORAL_TABLET | Freq: Every day | ORAL | 0 refills | Status: AC

## 2022-08-29 NOTE — Progress Notes (Unsigned)
Location:  [place] Room Number: 131 Place of Service:  SNF [id number])  Provider: Ok Edwards np   PCP: Monico Blitz, MD Patient Care Team: Monico Blitz, MD as PCP - General (Internal Medicine) Evans Lance, MD as Consulting Physician (Cardiology) Rolm Bookbinder, MD as Consulting Physician (General Surgery) Nicholas Lose, MD as Consulting Physician (Hematology and Oncology) Kyung Rudd, MD as Consulting Physician (Radiation Oncology) Delice Bison Charlestine Massed, NP as Nurse Practitioner (Hematology and Oncology)  Extended Emergency Contact Information Primary Emergency Contact: Freundlich,Leroy Address: [address]          [place], [place] [address] Johnnette Litter of [place] Phone: [phone] Mobile Phone: [phone] Relation: Spouse Secondary Emergency Contact: Christell Faith [place] of [place] Phone: [phone] Mobile Phone: [phone] Relation: Son  Code Status: full  Goals of care:  Advanced Directive information    08/11/2022   10:37 AM  Advanced Directives  Does Patient Have a Medical Advance Directive? No  Would patient like information on creating a medical advance directive? No - Patient declined     Allergies  Allergen Reactions   Biaxin [Clarithromycin] Shortness Of Breath and Rash    Unknown   Penicillins Hives    Has patient had a PCN reaction causing immediate rash, facial/tongue/throat swelling, SOB or lightheadedness with hypotension: Yes Has patient had a PCN reaction causing severe rash involving mucus membranes or skin necrosis: No Has patient had a PCN reaction that required hospitalization No Has patient had a PCN reaction occurring within the last 10 years: No If all of the above answers are "NO", then may proceed with Cephalosporin use. ANCEF on 08/11/22 without issue     Shellfish Allergy Nausea And Vomiting   Sulfa Antibiotics Other (See Comments)    "bad reaction," per patient   Celebrex  [Celecoxib] Rash    Chief Complaint  Patient presents with   Discharge Note    HPI:  88 y.o. female  being discharged to home with home health for pt/ot. She will need  bedside commode; will need her prescriptions written and will need to follow up with her medical provider. She had been hospitalized for an ORIF of her right hip. She was admitted to this facility for short term rehab. She has participated in pt/ot to improve upon her level of independence with her adls: sit stand pivot: supervision with walker; bed mobility: contact guard; ambulate 250 feet with rolling walker. Stairs with bilateral rails with supervision. Car transfers: supervision and walker. Upper body supervision; lover body; min assist, brp: contact guard assist. ST: SLUM 16-30: mild cognitive impairment. She is ready to return back home.     Past Medical History:  Diagnosis Date   A-fib [facility])    Anxiety    Bradycardia    s/p PPM October 2014   Chronic anticoagulation    Dysrhythmia    Headache(784.0)    High cholesterol    Hypertension    Malignant neoplasm of upper-outer quadrant of right breast in female, estrogen receptor positive ([place]) 06/15/2018   Multiple rib fractures 07/01/2016   fell down stairs    Pulmonary contusion 06/2016   from a fall    Stroke [facility]) September 2014   thrombolytic therapy   Thyroid disease    Vertical vertigo     Past Surgical History:  Procedure Laterality Date   BREAST LUMPECTOMY WITH RADIOACTIVE SEED LOCALIZATION Right 07/13/2018   Procedure: BREAST LUMPECTOMY WITH RADIOACTIVE SEED LOCALIZATION;  Surgeon: Rolm Bookbinder,  MD;  Location: [place];  Service: General;  Laterality: Right;   CHOLECYSTECTOMY     INSERT / REPLACE / REMOVE PACEMAKER     ORIF HIP FRACTURE Right 08/11/2022   Procedure: OPEN REDUCTION INTERNAL FIXATION HIP;  Surgeon: Mordecai Rasmussen, MD;  Location: [facility];  Service: Orthopedics;  Laterality: Right;   PACEMAKER INSERTION     PERMANENT PACEMAKER  INSERTION N/A 07/25/2013   Procedure: PERMANENT PACEMAKER INSERTION;  Surgeon: Deboraha Sprang, MD;  Location: [facility] CATH LAB;  Service: Cardiovascular;  Laterality: N/A;   TEE WITHOUT CARDIOVERSION N/A 07/16/2013   Procedure: TRANSESOPHAGEAL ECHOCARDIOGRAM (TEE);  Surgeon: Thayer Headings, MD;  Location: [place];  Service: Cardiovascular;  Laterality: N/A;      reports that she has never smoked. She has never used smokeless tobacco. She reports that she does not drink alcohol and does not use drugs. Social History   Socioeconomic History   Marital status: Married    Spouse name: leroy   Number of children: 2   Years of education: 12   Highest education level: Not on file  Occupational History   Occupation: retired  Tobacco Use   Smoking status: Never   Smokeless tobacco: Never  Vaping Use   Vaping Use: Never used  Substance and Sexual Activity   Alcohol use: No    Alcohol/week: 0.0 standard drinks of alcohol   Drug use: No   Sexual activity: Never  Other Topics Concern   Not on file  Social History Narrative   Patient lives at home with her husband.   Social Determinants of Health   Financial Resource Strain: Not on file  Food Insecurity: No Food Insecurity (08/10/2022)   Hunger Vital Sign    Worried About Running Out of Food in the Last Year: Never true    Ran Out of Food in the Last Year: Never true  Transportation Needs: No Transportation Needs (08/10/2022)   PRAPARE - Hydrologist (Medical): No    Lack of Transportation (Non-Medical): No  Physical Activity: Not on file  Stress: Not on file  Social Connections: Not on file  Intimate Partner Violence: Not At Risk (08/10/2022)   Humiliation, Afraid, Rape, and Kick questionnaire    Fear of Current or Ex-Partner: No    Emotionally Abused: No    Physically Abused: No    Sexually Abused: No   Functional Status Survey:    Allergies  Allergen Reactions   Biaxin [Clarithromycin]  Shortness Of Breath and Rash    Unknown   Penicillins Hives    Has patient had a PCN reaction causing immediate rash, facial/tongue/throat swelling, SOB or lightheadedness with hypotension: Yes Has patient had a PCN reaction causing severe rash involving mucus membranes or skin necrosis: No Has patient had a PCN reaction that required hospitalization No Has patient had a PCN reaction occurring within the last 10 years: No If all of the above answers are "NO", then may proceed with Cephalosporin use. ANCEF on 08/11/22 without issue     Shellfish Allergy Nausea And Vomiting   Sulfa Antibiotics Other (See Comments)    "bad reaction," per patient   Celebrex [Celecoxib] Rash    Pertinent  Health Maintenance Due  Topic Date Due   INFLUENZA VACCINE  05/17/2022   DEXA SCAN  Completed    Medications: Outpatient Encounter Medications as of 08/29/2022  Medication Sig   Acetaminophen 325 MG CAPS Take 650 mg by mouth every 6 (six) hours.  apixaban (ELIQUIS) 2.5 MG TABS tablet Take 1 tablet (2.5 mg total) by mouth 2 (two) times daily.   atorvastatin (LIPITOR) 10 MG tablet Take 1 tablet (10 mg total) by mouth every evening.   calcium carbonate (OSCAL) 1500 (600 Ca) MG TABS tablet Take by mouth 2 (two) times daily with a meal.   cholecalciferol (VITAMIN D) 1000 UNITS tablet Take 1,000 Units by mouth daily.    flecainide (TAMBOCOR) 150 MG tablet TAKE HALF A TABLET (75 MG TOTAL) BY MOUTH 2 (TWO) TIMES DAILY.   KRILL OIL PO Take by mouth.   levothyroxine (SYNTHROID) 112 MCG tablet Take 1 tablet (112 mcg total) by mouth daily before breakfast.   lisinopril (ZESTRIL) 20 MG tablet Take 1 tablet (20 mg total) by mouth daily.   metoprolol succinate (TOPROL XL) 25 MG 24 hr tablet Take 1 tablet (25 mg total) by mouth 2 (two) times daily.   Multiple Vitamins-Minerals (PRESERVISION AREDS PO) Take 1 tablet by mouth daily.   omeprazole (PRILOSEC) 20 MG capsule Take 20 mg by mouth at bedtime.    Polyethyl  Glycol-Propyl Glycol 0.4-0.3 % SOLN Place 1 drop into both eyes 3 (three) times daily as needed (for dry eyes.).   polyethylene glycol (MIRALAX / GLYCOLAX) packet Take 17 g by mouth daily as needed (for constipation.).    simethicone (MYLICON) 409 MG chewable tablet Chew 125-250 mg by mouth every 6 (six) hours as needed for flatulence.   No facility-administered encounter medications on file as of 08/29/2022.     Vitals:   08/29/22 1210  BP: 129/75  Pulse: 68  Resp: 18  Temp: 97.8 F (36.6 C)  SpO2: 96%  Weight: 124 lb 9.6 oz (56.5 kg)  Height: '5\' 4"'$  (1.626 m)   Body mass index is 21.39 kg/m.   SIGNIFICANT DIAGNOSTIC EXAMS  PREVIOUS   08-10-22: chest x-ray: Heart is enlarged the mediastinal contour stable. There is atherosclerotic calcification of the aorta. No consolidation, effusion, or pneumothorax. A dual lead pacemaker is present over the left chest. No acute osseous abnormality.  NO NEW EXAMS   LABS REVIEWED PREVIOUS   08-10-22: wbc 7.8; hgb 11.7; hct 36.1; mcv 96.0 plt 126; glucose 95; bun 23; creat 1.09; k+ 4.0; na++ 138;ca 9.2 gfr 48; protein 5,8 albumin 3,3 mag 1.8; phos 3.3 08-12-22: wbc 10.1 hgb 11.9; hct 36.2; mcv 94.5 plt 134; glucose 123; bun 21; creat 1.05 ;k+ 4.1; na++ 137; ca 9.1; gfr 50  08-22-22: wbc 6.2; hgb 11.4; hct 34.7; mcv 95.1 plt 255; glucose 87; bun 38;creat 1.26; k+ 4.3; na++ 137; ca 9.4 gfr 40  NO NEW LABS.   Review of Systems  Constitutional:  Negative for malaise/fatigue.  Respiratory:  Negative for cough and shortness of breath.   Cardiovascular:  Negative for chest pain, palpitations and leg swelling.  Gastrointestinal:  Negative for abdominal pain, constipation and heartburn.  Musculoskeletal:  Negative for back pain, joint pain and myalgias.  Skin: Negative.   Neurological:  Negative for dizziness.  Psychiatric/Behavioral:  The patient is not nervous/anxious.     Physical Exam Constitutional:      General: She is not in acute  distress.    Appearance: She is well-developed. She is not diaphoretic.  Neck:     Thyroid: No thyromegaly.  Cardiovascular:     Rate and Rhythm: Normal rate and regular rhythm.     Pulses: Normal pulses.     Heart sounds: Normal heart sounds.  Pulmonary:     Effort: Pulmonary  effort is normal. No respiratory distress.     Breath sounds: Normal breath sounds.  Abdominal:     General: Bowel sounds are normal. There is no distension.     Palpations: Abdomen is soft.     Tenderness: There is no abdominal tenderness.  Musculoskeletal:        General: Normal range of motion.     Cervical back: Neck supple.     Right lower leg: No edema.     Left lower leg: No edema.  Lymphadenopathy:     Cervical: No cervical adenopathy.  Skin:    General: Skin is warm and dry.  Neurological:     Mental Status: She is alert. Mental status is at baseline.  Psychiatric:        Mood and Affect: Mood normal.      Assessment/Plan:    Patient is being discharged with the following home health services:  pt/ot to evaluate and treat as indicated for gait balance strength adl training.   Patient is being discharged with the following durable medical equipment:  bed side commode   Patient has been advised to f/u with their PCP in 1-2 weeks to for a transitions of care visit.  Social services at their facility was responsible for arranging this appointment.  Pt was provided with adequate prescriptions of noncontrolled medications to reach the scheduled appointment .  For controlled substances, a limited supply was provided as appropriate for the individual patient.  If the pt normally receives these medications from a pain clinic or has a contract with another physician, these medications should be received from that clinic or physician only).    A 30 day supply of her prescription medications have been sent to [facility]  Time spent with patient: 35 minutes: medications; home health; dme    Ok Edwards  NP [facility]   call [phone]

## 2022-08-30 ENCOUNTER — Telehealth: Payer: Self-pay | Admitting: Internal Medicine

## 2022-08-30 NOTE — Telephone Encounter (Signed)
Spoke to daughter who stated that hospitalist decreased Eliquis dosage to 2.'5mg'$  tablets based on pt's current weight. Daughter advised that [facility] does not recommend breaking tablets in half. Pt's daughter verbalized understanding. Will route to provider as FYI.

## 2022-08-30 NOTE — Telephone Encounter (Signed)
Pt c/o medication issue:  1. Name of Medication:   apixaban (ELIQUIS) 2.5 MG TABS tablet    2. How are you currently taking this medication (dosage and times per day)?   Take 1 tablet (2.5 mg total) by mouth 2 (two) times daily.    3. Are you having a reaction (difficulty breathing--STAT)? No  4. What is your medication issue? Daughter states that pt's dosage was lowered to the above amount. She says that pt still has previous dosage of 5 MG and would like to know if tablet can be broken in half for current dosage. Please advise

## 2022-08-31 DIAGNOSIS — I700 Atherosclerosis of aorta: Secondary | ICD-10-CM | POA: Diagnosis not present

## 2022-08-31 DIAGNOSIS — S72001D Fracture of unspecified part of neck of right femur, subsequent encounter for closed fracture with routine healing: Secondary | ICD-10-CM | POA: Diagnosis not present

## 2022-08-31 DIAGNOSIS — E039 Hypothyroidism, unspecified: Secondary | ICD-10-CM | POA: Diagnosis not present

## 2022-08-31 DIAGNOSIS — I4819 Other persistent atrial fibrillation: Secondary | ICD-10-CM | POA: Diagnosis not present

## 2022-08-31 DIAGNOSIS — I1310 Hypertensive heart and chronic kidney disease without heart failure, with stage 1 through stage 4 chronic kidney disease, or unspecified chronic kidney disease: Secondary | ICD-10-CM | POA: Diagnosis not present

## 2022-08-31 DIAGNOSIS — N1831 Chronic kidney disease, stage 3a: Secondary | ICD-10-CM | POA: Diagnosis not present

## 2022-08-31 DIAGNOSIS — W19XXXD Unspecified fall, subsequent encounter: Secondary | ICD-10-CM | POA: Diagnosis not present

## 2022-08-31 DIAGNOSIS — Z7901 Long term (current) use of anticoagulants: Secondary | ICD-10-CM | POA: Diagnosis not present

## 2022-08-31 DIAGNOSIS — K219 Gastro-esophageal reflux disease without esophagitis: Secondary | ICD-10-CM | POA: Diagnosis not present

## 2022-08-31 DIAGNOSIS — Z8673 Personal history of transient ischemic attack (TIA), and cerebral infarction without residual deficits: Secondary | ICD-10-CM | POA: Diagnosis not present

## 2022-09-07 ENCOUNTER — Ambulatory Visit (INDEPENDENT_AMBULATORY_CARE_PROVIDER_SITE_OTHER): Payer: PPO

## 2022-09-07 DIAGNOSIS — I495 Sick sinus syndrome: Secondary | ICD-10-CM

## 2022-09-07 LAB — CUP PACEART REMOTE DEVICE CHECK
Battery Impedance: 1357 Ohm
Battery Remaining Longevity: 46 mo
Battery Voltage: 2.76 V
Brady Statistic AP VP Percent: 99 %
Brady Statistic AP VS Percent: 0 %
Brady Statistic AS VP Percent: 1 %
Brady Statistic AS VS Percent: 0 %
Date Time Interrogation Session: 20231121093503
Implantable Lead Connection Status: 753985
Implantable Lead Connection Status: 753985
Implantable Lead Implant Date: 20141009
Implantable Lead Implant Date: 20141009
Implantable Lead Location: 753859
Implantable Lead Location: 753860
Implantable Lead Model: 1944
Implantable Lead Model: 1948
Implantable Pulse Generator Implant Date: 20141009
Lead Channel Impedance Value: 580 Ohm
Lead Channel Impedance Value: 896 Ohm
Lead Channel Pacing Threshold Amplitude: 0.625 V
Lead Channel Pacing Threshold Amplitude: 0.875 V
Lead Channel Pacing Threshold Pulse Width: 0.4 ms
Lead Channel Pacing Threshold Pulse Width: 0.4 ms
Lead Channel Setting Pacing Amplitude: 2 V
Lead Channel Setting Pacing Amplitude: 2.5 V
Lead Channel Setting Pacing Pulse Width: 0.46 ms
Lead Channel Setting Sensing Sensitivity: 5.6 mV
Zone Setting Status: 755011
Zone Setting Status: 755011

## 2022-09-07 NOTE — Telephone Encounter (Signed)
I agree. She is now under 130 lbs and over 80 so a dose reduction is warranted to 2.5 mg bid. GT

## 2022-09-15 DIAGNOSIS — I10 Essential (primary) hypertension: Secondary | ICD-10-CM | POA: Diagnosis not present

## 2022-09-15 DIAGNOSIS — Z299 Encounter for prophylactic measures, unspecified: Secondary | ICD-10-CM | POA: Diagnosis not present

## 2022-09-15 DIAGNOSIS — Z6822 Body mass index (BMI) 22.0-22.9, adult: Secondary | ICD-10-CM | POA: Diagnosis not present

## 2022-09-15 DIAGNOSIS — Z2821 Immunization not carried out because of patient refusal: Secondary | ICD-10-CM | POA: Diagnosis not present

## 2022-09-15 DIAGNOSIS — M84451S Pathological fracture, right femur, sequela: Secondary | ICD-10-CM | POA: Diagnosis not present

## 2022-09-15 DIAGNOSIS — I4891 Unspecified atrial fibrillation: Secondary | ICD-10-CM | POA: Diagnosis not present

## 2022-09-15 DIAGNOSIS — Z789 Other specified health status: Secondary | ICD-10-CM | POA: Diagnosis not present

## 2022-09-16 DIAGNOSIS — W19XXXD Unspecified fall, subsequent encounter: Secondary | ICD-10-CM | POA: Diagnosis not present

## 2022-09-16 DIAGNOSIS — S72001D Fracture of unspecified part of neck of right femur, subsequent encounter for closed fracture with routine healing: Secondary | ICD-10-CM | POA: Diagnosis not present

## 2022-09-16 DIAGNOSIS — N1831 Chronic kidney disease, stage 3a: Secondary | ICD-10-CM | POA: Diagnosis not present

## 2022-09-16 DIAGNOSIS — E039 Hypothyroidism, unspecified: Secondary | ICD-10-CM | POA: Diagnosis not present

## 2022-09-16 DIAGNOSIS — K219 Gastro-esophageal reflux disease without esophagitis: Secondary | ICD-10-CM | POA: Diagnosis not present

## 2022-09-16 DIAGNOSIS — Z7901 Long term (current) use of anticoagulants: Secondary | ICD-10-CM | POA: Diagnosis not present

## 2022-09-16 DIAGNOSIS — I1310 Hypertensive heart and chronic kidney disease without heart failure, with stage 1 through stage 4 chronic kidney disease, or unspecified chronic kidney disease: Secondary | ICD-10-CM | POA: Diagnosis not present

## 2022-09-16 DIAGNOSIS — Z8673 Personal history of transient ischemic attack (TIA), and cerebral infarction without residual deficits: Secondary | ICD-10-CM | POA: Diagnosis not present

## 2022-09-16 DIAGNOSIS — I700 Atherosclerosis of aorta: Secondary | ICD-10-CM | POA: Diagnosis not present

## 2022-09-16 DIAGNOSIS — I4819 Other persistent atrial fibrillation: Secondary | ICD-10-CM | POA: Diagnosis not present

## 2022-09-19 DIAGNOSIS — I4819 Other persistent atrial fibrillation: Secondary | ICD-10-CM | POA: Diagnosis not present

## 2022-09-19 DIAGNOSIS — Z7901 Long term (current) use of anticoagulants: Secondary | ICD-10-CM | POA: Diagnosis not present

## 2022-09-19 DIAGNOSIS — I1310 Hypertensive heart and chronic kidney disease without heart failure, with stage 1 through stage 4 chronic kidney disease, or unspecified chronic kidney disease: Secondary | ICD-10-CM | POA: Diagnosis not present

## 2022-09-19 DIAGNOSIS — S72001D Fracture of unspecified part of neck of right femur, subsequent encounter for closed fracture with routine healing: Secondary | ICD-10-CM | POA: Diagnosis not present

## 2022-09-21 ENCOUNTER — Other Ambulatory Visit: Payer: Self-pay | Admitting: Adult Health

## 2022-09-23 ENCOUNTER — Ambulatory Visit (INDEPENDENT_AMBULATORY_CARE_PROVIDER_SITE_OTHER): Payer: PPO | Admitting: Orthopedic Surgery

## 2022-09-23 ENCOUNTER — Ambulatory Visit (INDEPENDENT_AMBULATORY_CARE_PROVIDER_SITE_OTHER): Payer: PPO

## 2022-09-23 ENCOUNTER — Encounter: Payer: Self-pay | Admitting: Orthopedic Surgery

## 2022-09-23 VITALS — BP 124/58 | HR 64 | Ht 64.0 in | Wt 124.0 lb

## 2022-09-23 DIAGNOSIS — S72001A Fracture of unspecified part of neck of right femur, initial encounter for closed fracture: Secondary | ICD-10-CM

## 2022-09-23 NOTE — Progress Notes (Signed)
Orthopaedic Postop Note  Assessment: Jessica Myers is a 88 y.o. female s/p CRP of right femoral neck fracture  DOS: 08/11/2022  Plan: Jessica Myers is improving.  Her pain is getting better.  Occasional pain in her groin.  She is using a walker to assist with ambulation.  We reviewed radiographs in clinic today which does demonstrate some shortening, and angulation of the fracture site.  Will continue to monitor this closely.  If her pain continues to get better, and she does not complain of pain, then nothing further will be needed.  If there is further displacement across the fracture site, we may have to consider a revision.  I will see her back in 6 weeks for repeat evaluation.  All questions were answered.  She is in agreement with this plan.  Follow-up: Return in about 6 weeks (around 11/04/2022). XR at next visit: AP pelvis and right hip x-ray  Subjective:  Chief Complaint  Patient presents with   Routine Post Op    Rt hip DOS 08/11/22    History of Present Illness: Jessica Myers is a 88 y.o. female who presents following the above stated procedure.  Surgery was 6 weeks ago.  She was seen by Dr. Aline Brochure approximately 2 weeks after surgery.  Radiographs were stable at that visit.  Since then, she continues to improve.  She is getting better.  Her leg is getting stronger.  She does have occasional pain in the lateral hip, as well as in the groin.  No issues with her surgical incisions.  She is working with home health PT.  Review of Systems: No fevers or chills No numbness or tingling No Chest Pain No shortness of breath   Objective: BP (!) 124/58   Pulse 64   Ht '5\' 4"'$  (1.626 m)   Wt 124 lb (56.2 kg)   BMI 21.28 kg/m   Physical Exam:  Elderly female.  No acute distress.  Steady gait with a walker.  Surgical incisions healing well.  No surrounding erythema or drainage.  No tenderness to palpation of the lateral hip.  She is able to maintain straight leg raise.  No  pain with axial loading.  She tolerates gentle range of motion of the right hip.  IMAGING: I personally ordered and reviewed the following images:  AP pelvis and right hip x-rays were obtained in clinic today.  Right hip joint is reduced.  Screws are intact.  They have backed out a little bit.  There has been some shortening through the fracture site.  Mild varus angulation is appreciated on the lateral.  No evidence of avascular necrosis.  Impression: Right femoral neck fracture with mild interval displacement, without hardware complication   Mordecai Rasmussen, MD 09/23/2022 11:39 AM

## 2022-09-25 ENCOUNTER — Other Ambulatory Visit: Payer: Self-pay | Admitting: Adult Health

## 2022-09-29 NOTE — Progress Notes (Signed)
Remote pacemaker transmission.   

## 2022-11-04 ENCOUNTER — Encounter: Payer: PPO | Admitting: Orthopedic Surgery

## 2022-11-11 ENCOUNTER — Ambulatory Visit (INDEPENDENT_AMBULATORY_CARE_PROVIDER_SITE_OTHER): Payer: PPO

## 2022-11-11 ENCOUNTER — Encounter: Payer: Self-pay | Admitting: Orthopedic Surgery

## 2022-11-11 ENCOUNTER — Ambulatory Visit (INDEPENDENT_AMBULATORY_CARE_PROVIDER_SITE_OTHER): Payer: PPO | Admitting: Orthopedic Surgery

## 2022-11-11 DIAGNOSIS — S72001A Fracture of unspecified part of neck of right femur, initial encounter for closed fracture: Secondary | ICD-10-CM

## 2022-11-11 DIAGNOSIS — S72001P Fracture of unspecified part of neck of right femur, subsequent encounter for closed fracture with malunion: Secondary | ICD-10-CM

## 2022-11-11 NOTE — Patient Instructions (Signed)
We discussed the possibility of revising your right hip to a right hip hemiarthroplasty (partial hip replacement)

## 2022-11-11 NOTE — Progress Notes (Signed)
Orthopaedic Postop Note  Assessment: Jessica Myers is a 89 y.o. female s/p CRP of right femoral neck fracture  DOS: 08/11/2022  Plan: Jessica Myers continues to have pain, 3 months out from surgery.  Radiographs today demonstrates interval consolidation of the displaced femoral neck fracture.  It does not appear to have shifted further, although it is certainly developing as a malunion.  This was discussed with the patient and Jessica Myers.  Jessica Myers has not noticed any improvement in Jessica symptoms, or Jessica ability to ambulate.  At this point, she will either except Jessica current limitations, or consider a revision to a right hip hemiarthroplasty.  Options were discussed.  They will consider their options.  Will meet in approximately 1 month for repeat evaluation.  All questions were answered.  Follow-up: Return in about 4 weeks (around 12/09/2022). XR at next visit: AP pelvis and right hip x-ray  Subjective:  Chief Complaint  Patient presents with   Follow-up    Recheck on right hip, DOS 08-11-22.    History of Present Illness: Jessica Myers is a 89 y.o. female who presents following the above stated procedure.  Surgery was 3 months ago.  I saw Jessica in clinic approximately 6 weeks ago.  Since then, she has continued to have pain in Jessica groin, as well as some associated pain in the right buttock and lateral hip.  She continues to require a walker.  According to Jessica Myers, she has not seen much improvement.  She has been limited by discomfort.  As such, Jessica activity level is less than it was prior to surgery.  No falls.  She is no longer working with a physical therapist.   Review of Systems: No fevers or chills No numbness or tingling No Chest Pain No shortness of breath   Objective: There were no vitals taken for this visit.  Physical Exam:  Elderly female.  No acute distress.  Steady gait with a walker.  Surgical incisions healing well.  No surrounding erythema or  drainage.  Tenderness to palpation over the incision.  No obvious discomfort with axial loading.  Some discomfort in the groin with gentle range of motion.  Internal rotation causes pain in Jessica hip.  Toes warm and well-perfused.  Dry skin distally.   IMAGING: I personally ordered and reviewed the following images:  AP pelvis and right hip x-rays were obtained in clinic today.  These were compared to prior x-rays.  Positioning of the screws remained stable.  No further backing out.  In addition, there is been interval consolidation at the fracture site, which is uniting and a slightly displaced position.  Compared to most recent x-rays, there has not been further displacement.  Hip joint is reduced.  No evidence of avascular necrosis.  Impression: Right hip femoral neck fracture, with persistent mild displacement, without hardware complication  Mordecai Rasmussen, MD 11/11/2022 11:14 AM

## 2022-11-18 DIAGNOSIS — Z299 Encounter for prophylactic measures, unspecified: Secondary | ICD-10-CM | POA: Diagnosis not present

## 2022-11-18 DIAGNOSIS — Z6822 Body mass index (BMI) 22.0-22.9, adult: Secondary | ICD-10-CM | POA: Diagnosis not present

## 2022-11-18 DIAGNOSIS — I10 Essential (primary) hypertension: Secondary | ICD-10-CM | POA: Diagnosis not present

## 2022-11-18 DIAGNOSIS — M25551 Pain in right hip: Secondary | ICD-10-CM | POA: Diagnosis not present

## 2022-11-18 DIAGNOSIS — R2681 Unsteadiness on feet: Secondary | ICD-10-CM | POA: Diagnosis not present

## 2022-11-18 DIAGNOSIS — M84451S Pathological fracture, right femur, sequela: Secondary | ICD-10-CM | POA: Diagnosis not present

## 2022-11-21 DIAGNOSIS — E2839 Other primary ovarian failure: Secondary | ICD-10-CM | POA: Diagnosis not present

## 2022-11-24 ENCOUNTER — Other Ambulatory Visit: Payer: Self-pay | Admitting: Adult Health

## 2022-12-07 ENCOUNTER — Ambulatory Visit (INDEPENDENT_AMBULATORY_CARE_PROVIDER_SITE_OTHER): Payer: PPO

## 2022-12-07 ENCOUNTER — Telehealth: Payer: Self-pay

## 2022-12-07 DIAGNOSIS — I495 Sick sinus syndrome: Secondary | ICD-10-CM | POA: Diagnosis not present

## 2022-12-07 NOTE — Telephone Encounter (Signed)
Patient called in letting us know she will not be able to send a transmission because she needs to call tech support and is not able to at this time and will later this day. I have given patient the number to tech support so she can call and get help

## 2022-12-08 LAB — CUP PACEART REMOTE DEVICE CHECK
Battery Impedance: 1470 Ohm
Battery Remaining Longevity: 44 mo
Battery Voltage: 2.75 V
Brady Statistic AP VP Percent: 99 %
Brady Statistic AP VS Percent: 0 %
Brady Statistic AS VP Percent: 1 %
Brady Statistic AS VS Percent: 0 %
Date Time Interrogation Session: 20240221113507
Implantable Lead Connection Status: 753985
Implantable Lead Connection Status: 753985
Implantable Lead Implant Date: 20141009
Implantable Lead Implant Date: 20141009
Implantable Lead Location: 753859
Implantable Lead Location: 753860
Implantable Lead Model: 1944
Implantable Lead Model: 1948
Implantable Pulse Generator Implant Date: 20141009
Lead Channel Impedance Value: 544 Ohm
Lead Channel Impedance Value: 891 Ohm
Lead Channel Pacing Threshold Amplitude: 0.75 V
Lead Channel Pacing Threshold Amplitude: 0.875 V
Lead Channel Pacing Threshold Pulse Width: 0.4 ms
Lead Channel Pacing Threshold Pulse Width: 0.4 ms
Lead Channel Setting Pacing Amplitude: 2 V
Lead Channel Setting Pacing Amplitude: 2.5 V
Lead Channel Setting Pacing Pulse Width: 0.4 ms
Lead Channel Setting Sensing Sensitivity: 5.6 mV
Zone Setting Status: 755011
Zone Setting Status: 755011

## 2022-12-09 ENCOUNTER — Encounter: Payer: Self-pay | Admitting: Orthopedic Surgery

## 2022-12-09 ENCOUNTER — Ambulatory Visit (INDEPENDENT_AMBULATORY_CARE_PROVIDER_SITE_OTHER): Payer: PPO | Admitting: Orthopedic Surgery

## 2022-12-09 ENCOUNTER — Ambulatory Visit (INDEPENDENT_AMBULATORY_CARE_PROVIDER_SITE_OTHER): Payer: PPO

## 2022-12-09 VITALS — Ht 64.0 in | Wt 124.0 lb

## 2022-12-09 DIAGNOSIS — S72001P Fracture of unspecified part of neck of right femur, subsequent encounter for closed fracture with malunion: Secondary | ICD-10-CM | POA: Diagnosis not present

## 2022-12-10 ENCOUNTER — Encounter: Payer: Self-pay | Admitting: Orthopedic Surgery

## 2022-12-10 NOTE — Progress Notes (Signed)
Orthopaedic Postop Note  Assessment: Jessica Myers is a 89 y.o. female s/p CRP of right femoral neck fracture  DOS: 08/11/2022  Plan: Jessica Myers continues to have issues following surgery.  Radiographs remained stable, the fracture has collapsed into some varus, but appears to be healing.  Occasional pain in her groin, as well as over the lateral hip.  She continues to use a walker.  Once again, we discussed the possibility of proceeding with a revision surgery.  For now, she states she is comfortable.  She would like to proceed with current management, continue to use her walker.  Family is in the room, and does state that Jessica Myers is gradually gaining her independence.  I will see her back in 2 months.  If she has issues before then, she will return to clinic.  Follow-up: Return in about 2 months (around 02/07/2023). XR at next visit: AP pelvis and right hip x-ray  Subjective:  Chief Complaint  Patient presents with   Routine Post Op    Rt hip DOS 08/11/22 Has good and bad days with walking but still having pain    History of Present Illness: Jessica Myers is a 89 y.o. female who presents following the above stated procedure.  Surgery was 4 months ago.  I saw her in clinic approximately 1 month ago.  She continues to ambulate with the assistance of a walker.  She does have occasional pain in her groin, as well as over the lateral hip.  This has remained constant, but is gradually getting better.  She continues to use a walker.  Family states that she is doing a little bit better overall.  She saw her primary care doctor who recommended against revision surgery.  No issues with her incision.   Review of Systems: No fevers or chills No numbness or tingling No Chest Pain No shortness of breath   Objective: Ht '5\' 4"'$  (1.626 m)   Wt 124 lb (56.2 kg)   BMI 21.28 kg/m   Physical Exam:  Elderly female.  No acute distress.  Steady gait with a walker.  Right lateral  surgical incision is healed.  No surrounding erythema or drainage.  Mild tenderness to palpation over the lateral hip.  No pain with axial loading.  She tolerates gentle range of motion of the right hip.  Toes warm well-perfused.  Active motion intact in the EHL/TA.  IMAGING: I personally ordered and reviewed the following images:  AP pelvis and right hip x-rays were obtained in clinic today.  These are compared to prior x-rays.  There has been no backing out of the screws.  The screws are not cutting out.  The fracture is collapsed into varus, but appears to be stable.  There is interval consolidation in its current position.  No evidence of AVN.  Impression: Right femoral neck fracture, with mild varus displacement and no hardware complication.  Mordecai Rasmussen, MD 12/10/2022 7:52 AM

## 2022-12-13 ENCOUNTER — Encounter: Payer: Self-pay | Admitting: Internal Medicine

## 2022-12-13 ENCOUNTER — Ambulatory Visit: Payer: PPO | Attending: Internal Medicine | Admitting: Internal Medicine

## 2022-12-13 VITALS — BP 124/62 | HR 60 | Ht 65.0 in | Wt 122.8 lb

## 2022-12-13 DIAGNOSIS — S72001D Fracture of unspecified part of neck of right femur, subsequent encounter for closed fracture with routine healing: Secondary | ICD-10-CM | POA: Diagnosis not present

## 2022-12-13 DIAGNOSIS — R262 Difficulty in walking, not elsewhere classified: Secondary | ICD-10-CM | POA: Diagnosis not present

## 2022-12-13 DIAGNOSIS — I495 Sick sinus syndrome: Secondary | ICD-10-CM | POA: Diagnosis not present

## 2022-12-13 LAB — CUP PACEART INCLINIC DEVICE CHECK
Battery Impedance: 1497 Ohm
Battery Remaining Longevity: 44 mo
Battery Voltage: 2.75 V
Brady Statistic AP VP Percent: 99 %
Brady Statistic AP VS Percent: 0 %
Brady Statistic AS VP Percent: 1 %
Brady Statistic AS VS Percent: 0 %
Brady Statistic RA Percent Paced: 98.9 %
Brady Statistic RV Percent Paced: 100 %
Date Time Interrogation Session: 20240227173750
Implantable Lead Connection Status: 753985
Implantable Lead Connection Status: 753985
Implantable Lead Implant Date: 20141009
Implantable Lead Implant Date: 20141009
Implantable Lead Location: 753859
Implantable Lead Location: 753860
Implantable Lead Model: 1944
Implantable Lead Model: 1948
Implantable Pulse Generator Implant Date: 20141009
Lead Channel Impedance Value: 630 Ohm
Lead Channel Impedance Value: 877 Ohm
Lead Channel Pacing Threshold Amplitude: 0.75 V
Lead Channel Pacing Threshold Amplitude: 0.75 V
Lead Channel Pacing Threshold Amplitude: 0.75 V
Lead Channel Pacing Threshold Amplitude: 1 V
Lead Channel Pacing Threshold Pulse Width: 0.4 ms
Lead Channel Pacing Threshold Pulse Width: 0.4 ms
Lead Channel Pacing Threshold Pulse Width: 0.4 ms
Lead Channel Pacing Threshold Pulse Width: 0.4 ms
Lead Channel Sensing Intrinsic Amplitude: 2.8 mV
Lead Channel Setting Pacing Amplitude: 2 V
Lead Channel Setting Pacing Amplitude: 2.5 V
Lead Channel Setting Pacing Pulse Width: 0.4 ms
Lead Channel Setting Sensing Sensitivity: 5.6 mV
Zone Setting Status: 755011
Zone Setting Status: 755011

## 2022-12-13 NOTE — Progress Notes (Signed)
HPI Jessica Myers returns today for followup. She is a pleasant 89 yo woman with a h/o sinus node dysfunction, s/p PPM insertion, PAF, and HTN. In the interim, she has done well except that she has occaisional palpitations for which she will take an extra toprol. No syncope. No palpitations . The patient has fallen and broken her femur and had surgery 5 months ago. She is improved. She gets around with a walker.  Allergies  Allergen Reactions   Biaxin [Clarithromycin] Shortness Of Breath and Rash    Unknown   Penicillins Hives    Has patient had a PCN reaction causing immediate rash, facial/tongue/throat swelling, SOB or lightheadedness with hypotension: Yes Has patient had a PCN reaction causing severe rash involving mucus membranes or skin necrosis: No Has patient had a PCN reaction that required hospitalization No Has patient had a PCN reaction occurring within the last 10 years: No If all of the above answers are "NO", then may proceed with Cephalosporin use. ANCEF on 08/11/22 without issue     Shellfish Allergy Nausea And Vomiting   Sulfa Antibiotics Other (See Comments)    "bad reaction," per patient   Celebrex [Celecoxib] Rash     Current Outpatient Medications  Medication Sig Dispense Refill   Acetaminophen 325 MG CAPS Take 650 mg by mouth every 6 (six) hours.     apixaban (ELIQUIS) 2.5 MG TABS tablet Take 1 tablet (2.5 mg total) by mouth 2 (two) times daily. 60 tablet 0   atorvastatin (LIPITOR) 10 MG tablet Take 1 tablet (10 mg total) by mouth every evening. 30 tablet 0   calcium carbonate (OSCAL) 1500 (600 Ca) MG TABS tablet Take by mouth 2 (two) times daily with a meal.     cholecalciferol (VITAMIN D) 1000 UNITS tablet Take 1,000 Units by mouth daily.      flecainide (TAMBOCOR) 150 MG tablet TAKE HALF A TABLET (75 MG TOTAL) BY MOUTH 2 (TWO) TIMES DAILY. 30 tablet 0   levothyroxine (SYNTHROID) 112 MCG tablet Take 1 tablet (112 mcg total) by mouth daily before  breakfast. 30 tablet 0   lisinopril (ZESTRIL) 20 MG tablet Take 1 tablet (20 mg total) by mouth daily. 30 tablet 0   metoprolol succinate (TOPROL-XL) 25 MG 24 hr tablet TAKE 1 TABLET BY MOUTH TWICE A DAY (Patient taking differently: Take 25 mg by mouth 3 (three) times daily.) 180 tablet 1   Polyethyl Glycol-Propyl Glycol 0.4-0.3 % SOLN Place 1 drop into both eyes 3 (three) times daily as needed (for dry eyes.).     polyethylene glycol (MIRALAX / GLYCOLAX) packet Take 17 g by mouth daily as needed (for constipation.).      simethicone (MYLICON) 0000000 MG chewable tablet Chew 125-250 mg by mouth every 6 (six) hours as needed for flatulence.     KRILL OIL PO Take by mouth. (Patient not taking: Reported on 12/13/2022)     Multiple Vitamins-Minerals (PRESERVISION AREDS PO) Take 1 tablet by mouth daily. (Patient not taking: Reported on 12/13/2022)     omeprazole (PRILOSEC) 20 MG capsule Take 20 mg by mouth at bedtime.  (Patient not taking: Reported on 12/13/2022)     No current facility-administered medications for this visit.     Past Medical History:  Diagnosis Date   A-fib [facility])    Anxiety    Bradycardia    s/p PPM October 2014   Chronic anticoagulation    Dysrhythmia    Headache(784.0)    High cholesterol  Hypertension    Malignant neoplasm of upper-outer quadrant of right breast in female, estrogen receptor positive ([place]) 06/15/2018   Multiple rib fractures 07/01/2016   fell down stairs    Pulmonary contusion 06/2016   from a fall    Stroke [facility]) September 2014   thrombolytic therapy   Thyroid disease    Vertical vertigo     ROS:   All systems reviewed and negative except as noted in the HPI.   Past Surgical History:  Procedure Laterality Date   BREAST LUMPECTOMY WITH RADIOACTIVE SEED LOCALIZATION Right 07/13/2018   Procedure: BREAST LUMPECTOMY WITH RADIOACTIVE SEED LOCALIZATION;  Surgeon: Rolm Bookbinder, MD;  Location: [facility];  Service: General;  Laterality: Right;    CHOLECYSTECTOMY     INSERT / REPLACE / REMOVE PACEMAKER     ORIF HIP FRACTURE Right 08/11/2022   Procedure: OPEN REDUCTION INTERNAL FIXATION HIP;  Surgeon: Mordecai Rasmussen, MD;  Location: [facility];  Service: Orthopedics;  Laterality: Right;   PACEMAKER INSERTION     PERMANENT PACEMAKER INSERTION N/A 07/25/2013   Procedure: PERMANENT PACEMAKER INSERTION;  Surgeon: Deboraha Sprang, MD;  Location: [facility] CATH LAB;  Service: Cardiovascular;  Laterality: N/A;   TEE WITHOUT CARDIOVERSION N/A 07/16/2013   Procedure: TRANSESOPHAGEAL ECHOCARDIOGRAM (TEE);  Surgeon: Thayer Headings, MD;  Location: [facility] ENDOSCOPY;  Service: Cardiovascular;  Laterality: N/A;     Family History  Problem Relation Age of Onset   Cancer Mother    Breast cancer Mother    Cancer Father    Lung cancer Father      Social History   Socioeconomic History   Marital status: Married    Spouse name: leroy   Number of children: 2   Years of education: 12   Highest education level: Not on file  Occupational History   Occupation: retired  Tobacco Use   Smoking status: Never   Smokeless tobacco: Never  Vaping Use   Vaping Use: Never used  Substance and Sexual Activity   Alcohol use: No    Alcohol/week: 0.0 standard drinks of alcohol   Drug use: No   Sexual activity: Never  Other Topics Concern   Not on file  Social History Narrative   Patient lives at home with her husband.   Social Determinants of Health   Financial Resource Strain: Not on file  Food Insecurity: No Food Insecurity (08/10/2022)   Hunger Vital Sign    Worried About Running Out of Food in the Last Year: Never true    Ran Out of Food in the Last Year: Never true  Transportation Needs: No Transportation Needs (08/10/2022)   PRAPARE - Hydrologist (Medical): No    Lack of Transportation (Non-Medical): No  Physical Activity: Not on file  Stress: Not on file  Social Connections: Not on file  Intimate Partner Violence: Not At  Risk (08/10/2022)   Humiliation, Afraid, Rape, and Kick questionnaire    Fear of Current or Ex-Partner: No    Emotionally Abused: No    Physically Abused: No    Sexually Abused: No     BP 124/62   Pulse 60   Ht '5\' 5"'$  (1.651 m)   Wt 122 lb 12.8 oz (55.7 kg)   SpO2 98%   BMI 20.43 kg/m   Physical Exam:  Well appearing NAD HEENT: Unremarkable Neck:  No JVD, no thyromegally Lymphatics:  No adenopathy Back:  No CVA tenderness Lungs:  Clear with no wheezes HEART:  Regular rate rhythm, no murmurs, no rubs, no clicks Abd:  soft, positive bowel sounds, no organomegally, no rebound, no guarding Ext:  2 plus pulses, no edema, no cyanosis, no clubbing Skin:  No rashes no nodules Neuro:  CN II through XII intact, motor grossly intact   DEVICE  Normal device function.  See PaceArt for details.   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  PPM - her medtronic DDD PM is working normally. We will recheck in several years. Coags - she has had her dose of eliquis decreased. We will follow.  Carleene Overlie Shada Nienaber,MD

## 2022-12-13 NOTE — Patient Instructions (Signed)
Medication Instructions:  Your physician recommends that you continue on your current medications as directed. Please refer to the Current Medication list given to you today.  *If you need a refill on your cardiac medications before your next appointment, please call your pharmacy*   Lab Work: NONE   If you have labs (blood work) drawn today and your tests are completely normal, you will receive your results only by: [place] (if you have MyChart) OR A paper copy in the mail If you have any lab test that is abnormal or we need to change your treatment, we will call you to review the results.   Testing/Procedures: NONE    Follow-Up: At [facility], you and your health needs are our priority.  As part of our continuing mission to provide you with exceptional heart care, we have created designated Provider Care Teams.  These Care Teams include your primary Cardiologist (physician) and Advanced Practice Providers (APPs -  Physician Assistants and Nurse Practitioners) who all work together to provide you with the care you need, when you need it.  We recommend signing up for the patient portal called "MyChart".  Sign up information is provided on this After Visit Summary.  MyChart is used to connect with patients for Virtual Visits (Telemedicine).  Patients are able to view lab/test results, encounter notes, upcoming appointments, etc.  Non-urgent messages can be sent to your provider as well.   To learn more about what you can do with MyChart, go to [email or web address].    Your next appointment:   1 year(s)  Provider:   Cristopher Peru, MD    Other Instructions Thank you for choosing [facility]!

## 2022-12-15 ENCOUNTER — Encounter: Payer: Self-pay | Admitting: Radiology

## 2022-12-19 DIAGNOSIS — M25559 Pain in unspecified hip: Secondary | ICD-10-CM | POA: Diagnosis not present

## 2022-12-19 DIAGNOSIS — Z299 Encounter for prophylactic measures, unspecified: Secondary | ICD-10-CM | POA: Diagnosis not present

## 2022-12-19 DIAGNOSIS — I10 Essential (primary) hypertension: Secondary | ICD-10-CM | POA: Diagnosis not present

## 2022-12-19 DIAGNOSIS — I4891 Unspecified atrial fibrillation: Secondary | ICD-10-CM | POA: Diagnosis not present

## 2023-01-03 NOTE — Progress Notes (Signed)
Remote pacemaker transmission.   

## 2023-02-07 ENCOUNTER — Encounter: Payer: PPO | Admitting: Internal Medicine

## 2023-02-14 ENCOUNTER — Other Ambulatory Visit: Payer: Self-pay

## 2023-02-14 ENCOUNTER — Encounter: Payer: Self-pay | Admitting: Orthopedic Surgery

## 2023-02-14 ENCOUNTER — Ambulatory Visit: Payer: PPO | Admitting: Orthopedic Surgery

## 2023-02-14 DIAGNOSIS — S72001P Fracture of unspecified part of neck of right femur, subsequent encounter for closed fracture with malunion: Secondary | ICD-10-CM | POA: Diagnosis not present

## 2023-02-14 NOTE — Progress Notes (Signed)
Orthopaedic Postop Note  Assessment: Jessica Myers is a 89 y.o. female s/p CRP of right femoral neck fracture  DOS: 08/11/2022  Plan: Jessica Myers has developed a malunion of the right femoral neck fracture.  There has been subsidence into varus.  Overall, her pain is improving.  She feels stronger.  She is taking Tylenol occasionally.  We are unable to obtain new x-rays in clinic today, but the screws are not palpable over the lateral hip.  Once again, we discussed surgical option, but she is not interested.  We will continue to keep a close eye on her.  I will see her back in 6 weeks for repeat evaluation.  Follow-up: Return in about 6 weeks (around 03/28/2023). XR at next visit: AP pelvis and right hip x-ray  Subjective:  Chief Complaint  Patient presents with   Routine Post Op    R hip DOS: 08/11/2022    History of Present Illness: Jessica Myers is a 89 y.o. female who presents following the above stated procedure.  Surgery was 6 months ago.  She continues to have some pain in her right groin.  She feels stronger.  She feels the pain is improving.  She continues to use a walker to assist with ambulation.  She states that she does walk some without a walker, but notices that she limps.  She has tried an insert in her shoe, which does help.  She is taking Tylenol, occasionally in the morning, and sometimes at night.  Rarely does she take it more than once a day.  Review of Systems: No fevers or chills No numbness or tingling No Chest Pain No shortness of breath   Objective: There were no vitals taken for this visit.  Physical Exam:  Elderly female.  No acute distress.  Steady gait with a walker.  Surgical incision is healed.  She is able to maintain a straight leg raise.  Minimal pain with axial loading.  Mild discomfort with gentle range of motion of the hip.  Limited internal and external rotation, with some pain in her groin.  Toes are warm and well-perfused.   Sensation intact distally.  IMAGING: I personally ordered and reviewed the following images:  No new imaging obtained today.  Oliver Barre, MD 02/14/2023 10:44 AM

## 2023-02-15 ENCOUNTER — Telehealth: Payer: Self-pay | Admitting: Orthopedic Surgery

## 2023-02-15 NOTE — Telephone Encounter (Signed)
Called pt and let her know information from provider. No further questions or concerns at this time.

## 2023-02-15 NOTE — Telephone Encounter (Signed)
Patient called and states she was here yesterday and the XRAY machine was down, and she did not see the order for a XRAY to be done at [facility].  She said nothing was said about going to [facility], just that the xray machine was down.   She states it is on her paperwork, the only thing I see is   Return in about 6 weeks (around 03/28/2023).  She thinks this is from another person order.  I asked her if her name is on the paperwork and she said yes it is, but she thinks this is a mistake.   I told her we do have someone working the [employer] today, and we will see what Dr. Dallas Schimke says and his assistant will call her back   Please call her back at [phone].

## 2023-02-20 ENCOUNTER — Telehealth: Payer: Self-pay | Admitting: Internal Medicine

## 2023-02-20 NOTE — Telephone Encounter (Signed)
Left message to return call 

## 2023-02-20 NOTE — Telephone Encounter (Signed)
Patient called and said that during the night it felt like something had stopped in her chest or stomach and she said she kind of jerked and it went away and she went back to sleep. She said that it happened last week as well but didn't think too much of it.

## 2023-02-20 NOTE — Telephone Encounter (Signed)
Pt is returning call.  

## 2023-02-21 NOTE — Telephone Encounter (Signed)
Patient is returning call.  °

## 2023-02-21 NOTE — Telephone Encounter (Signed)
Remote transmission received and review. No alerts received. Advised patient to f/u with PCP to see what their recommendations are. Patient voiced understanding and appreciative of call.

## 2023-02-21 NOTE — Telephone Encounter (Signed)
Pt states that she was resting in the bed on Sunday night and she felt something "quiver" in her belly. She jumped and caught her breath and the feeling went away. No other episodes since this. Pt denies SOB, CP at this time. Pt states that she fell 6 months ago and has been stressed about that. She just wanted to call and see if any notifications came through on her monitor then. Please advise.

## 2023-03-08 ENCOUNTER — Ambulatory Visit (INDEPENDENT_AMBULATORY_CARE_PROVIDER_SITE_OTHER): Payer: PPO

## 2023-03-08 DIAGNOSIS — I495 Sick sinus syndrome: Secondary | ICD-10-CM

## 2023-03-08 LAB — CUP PACEART REMOTE DEVICE CHECK
Battery Impedance: 1636 Ohm
Battery Remaining Longevity: 40 mo
Battery Voltage: 2.75 V
Brady Statistic AP VP Percent: 95 %
Brady Statistic AP VS Percent: 0 %
Brady Statistic AS VP Percent: 5 %
Brady Statistic AS VS Percent: 0 %
Date Time Interrogation Session: 20240522090006
Implantable Lead Connection Status: 753985
Implantable Lead Connection Status: 753985
Implantable Lead Implant Date: 20141009
Implantable Lead Implant Date: 20141009
Implantable Lead Location: 753859
Implantable Lead Location: 753860
Implantable Lead Model: 1944
Implantable Lead Model: 1948
Implantable Pulse Generator Implant Date: 20141009
Lead Channel Impedance Value: 529 Ohm
Lead Channel Impedance Value: 885 Ohm
Lead Channel Pacing Threshold Amplitude: 0.75 V
Lead Channel Pacing Threshold Amplitude: 0.75 V
Lead Channel Pacing Threshold Pulse Width: 0.4 ms
Lead Channel Pacing Threshold Pulse Width: 0.4 ms
Lead Channel Setting Pacing Amplitude: 2 V
Lead Channel Setting Pacing Amplitude: 2.5 V
Lead Channel Setting Pacing Pulse Width: 0.4 ms
Lead Channel Setting Sensing Sensitivity: 5.6 mV
Zone Setting Status: 755011
Zone Setting Status: 755011

## 2023-03-28 ENCOUNTER — Ambulatory Visit (INDEPENDENT_AMBULATORY_CARE_PROVIDER_SITE_OTHER): Payer: PPO | Admitting: Orthopedic Surgery

## 2023-03-28 ENCOUNTER — Other Ambulatory Visit (INDEPENDENT_AMBULATORY_CARE_PROVIDER_SITE_OTHER): Payer: PPO

## 2023-03-28 ENCOUNTER — Encounter: Payer: Self-pay | Admitting: Orthopedic Surgery

## 2023-03-28 DIAGNOSIS — S72001P Fracture of unspecified part of neck of right femur, subsequent encounter for closed fracture with malunion: Secondary | ICD-10-CM | POA: Diagnosis not present

## 2023-03-28 NOTE — Progress Notes (Signed)
Orthopaedic Postop Note  Assessment: Jessica Myers is a 89 y.o. female s/p CRP of right femoral neck fracture  DOS: 08/11/2022  Plan: [name] feels as though she is is continuing to improve.  Repeat radiographs today demonstrates malunion of the femoral neck fracture.  There is slight varus angulation, but this has remained consistent over several visits.  Her overall function is improving.  She is able to ambulate for short distances at home without assistive device.  We have provided her with a prescription for a cane.  Once again, we discussed the nature of revision surgery.  She remains not interested in surgery.  She is pleased with her overall progress, and feels as though she will continue to get better.  If she has any further issues, she will contact the clinic.  Otherwise, follow-up as needed.   Follow-up: Return if symptoms worsen or fail to improve. XR at next visit: AP pelvis and right hip x-ray  Subjective:  Chief Complaint  Patient presents with   Routine Post Op    R hip DOS: 08/11/2022    History of Present Illness: Jessica Myers is a 89 y.o. female who presents following the above stated procedure.  Surgery was 7 months ago.  Occasional pain in her groin.  She uses a walker over long distances.  She can ambulate in her home without assistive device.  The pain is not constant.  Occasional Tylenol.  She notices that the right leg is shorter than the left, but plans to get some inserts in her shoes.  She has not driven since the injury.  She states that she does not leave the house on a regular basis.   Review of Systems: No fevers or chills No numbness or tingling No Chest Pain No shortness of breath   Objective: There were no vitals taken for this visit.  Physical Exam:  Elderly female.  No acute distress.  She is ambulating well with the assistance of a walker.  Lateral base surgical incision is healed.  No surrounding erythema or drainage.  Hardware  is not palpable on the lateral aspect of her hip.  She is able to maintain a straight leg raise.  Mild discomfort with gentle range of motion.  Toes warm and well-perfused.  IMAGING: I personally ordered and reviewed the following images:  AP pelvis and right hip x-rays were obtained in clinic today.  These are compared to prior x-rays.  There has been no interval displacement of the fracture.  There does appear to be healing at the fracture site, and varus alignment.  Screws remain in the same position.  No screw cut out.  The screws are not backing out.  No evidence of avascular necrosis.  Impression: Right femoral neck fracture and mild varus alignment, with stable positioning of the hardware  Oliver Barre, MD 03/28/2023 1:11 PM

## 2023-03-30 NOTE — Progress Notes (Signed)
Remote pacemaker transmission.   

## 2023-04-05 DIAGNOSIS — N183 Chronic kidney disease, stage 3 unspecified: Secondary | ICD-10-CM | POA: Diagnosis not present

## 2023-04-05 DIAGNOSIS — I700 Atherosclerosis of aorta: Secondary | ICD-10-CM | POA: Diagnosis not present

## 2023-04-05 DIAGNOSIS — Z299 Encounter for prophylactic measures, unspecified: Secondary | ICD-10-CM | POA: Diagnosis not present

## 2023-04-05 DIAGNOSIS — I739 Peripheral vascular disease, unspecified: Secondary | ICD-10-CM | POA: Diagnosis not present

## 2023-04-05 DIAGNOSIS — Z0000 Encounter for general adult medical examination without abnormal findings: Secondary | ICD-10-CM | POA: Diagnosis not present

## 2023-04-05 DIAGNOSIS — I10 Essential (primary) hypertension: Secondary | ICD-10-CM | POA: Diagnosis not present

## 2023-04-25 ENCOUNTER — Telehealth: Payer: Self-pay | Admitting: Hematology and Oncology

## 2023-04-25 NOTE — Telephone Encounter (Signed)
Patient just under went surgery and is unable to make it to original appointment date; patient it aware of rescheduled appointment dates/times. Patient stated they would call back if need to change for a later date then scheduled.

## 2023-05-02 ENCOUNTER — Inpatient Hospital Stay: Payer: PPO | Admitting: Hematology and Oncology

## 2023-05-23 ENCOUNTER — Other Ambulatory Visit: Payer: Self-pay | Admitting: Adult Health

## 2023-06-07 ENCOUNTER — Ambulatory Visit (INDEPENDENT_AMBULATORY_CARE_PROVIDER_SITE_OTHER): Payer: PPO

## 2023-06-07 DIAGNOSIS — I495 Sick sinus syndrome: Secondary | ICD-10-CM | POA: Diagnosis not present

## 2023-06-07 DIAGNOSIS — E039 Hypothyroidism, unspecified: Secondary | ICD-10-CM | POA: Diagnosis not present

## 2023-06-07 DIAGNOSIS — R5383 Other fatigue: Secondary | ICD-10-CM | POA: Diagnosis not present

## 2023-06-07 DIAGNOSIS — Z0000 Encounter for general adult medical examination without abnormal findings: Secondary | ICD-10-CM | POA: Diagnosis not present

## 2023-06-07 DIAGNOSIS — E7800 Pure hypercholesterolemia, unspecified: Secondary | ICD-10-CM | POA: Diagnosis not present

## 2023-06-07 DIAGNOSIS — Z299 Encounter for prophylactic measures, unspecified: Secondary | ICD-10-CM | POA: Diagnosis not present

## 2023-06-07 DIAGNOSIS — I10 Essential (primary) hypertension: Secondary | ICD-10-CM | POA: Diagnosis not present

## 2023-06-07 LAB — CUP PACEART REMOTE DEVICE CHECK
Battery Impedance: 1724 Ohm
Battery Remaining Longevity: 37 mo
Battery Voltage: 2.74 V
Brady Statistic AP VP Percent: 97 %
Brady Statistic AP VS Percent: 0 %
Brady Statistic AS VP Percent: 3 %
Brady Statistic AS VS Percent: 0 %
Date Time Interrogation Session: 20240821164056
Implantable Lead Connection Status: 753985
Implantable Lead Connection Status: 753985
Implantable Lead Implant Date: 20141009
Implantable Lead Implant Date: 20141009
Implantable Lead Location: 753859
Implantable Lead Location: 753860
Implantable Lead Model: 1944
Implantable Lead Model: 1948
Implantable Pulse Generator Implant Date: 20141009
Lead Channel Impedance Value: 529 Ohm
Lead Channel Impedance Value: 824 Ohm
Lead Channel Pacing Threshold Amplitude: 0.625 V
Lead Channel Pacing Threshold Amplitude: 0.75 V
Lead Channel Pacing Threshold Pulse Width: 0.4 ms
Lead Channel Pacing Threshold Pulse Width: 0.4 ms
Lead Channel Setting Pacing Amplitude: 2 V
Lead Channel Setting Pacing Amplitude: 2.5 V
Lead Channel Setting Pacing Pulse Width: 0.46 ms
Lead Channel Setting Sensing Sensitivity: 5.6 mV
Zone Setting Status: 755011
Zone Setting Status: 755011

## 2023-06-08 DIAGNOSIS — E7800 Pure hypercholesterolemia, unspecified: Secondary | ICD-10-CM | POA: Diagnosis not present

## 2023-06-08 DIAGNOSIS — Z79899 Other long term (current) drug therapy: Secondary | ICD-10-CM | POA: Diagnosis not present

## 2023-06-08 DIAGNOSIS — E559 Vitamin D deficiency, unspecified: Secondary | ICD-10-CM | POA: Diagnosis not present

## 2023-06-08 DIAGNOSIS — R5383 Other fatigue: Secondary | ICD-10-CM | POA: Diagnosis not present

## 2023-06-08 DIAGNOSIS — E039 Hypothyroidism, unspecified: Secondary | ICD-10-CM | POA: Diagnosis not present

## 2023-06-21 NOTE — Progress Notes (Signed)
Remote pacemaker transmission.   

## 2023-06-29 ENCOUNTER — Other Ambulatory Visit: Payer: Self-pay | Admitting: Internal Medicine

## 2023-06-29 DIAGNOSIS — I4891 Unspecified atrial fibrillation: Secondary | ICD-10-CM

## 2023-07-18 ENCOUNTER — Inpatient Hospital Stay: Payer: PPO | Admitting: Hematology and Oncology

## 2023-07-18 ENCOUNTER — Telehealth: Payer: Self-pay | Admitting: Hematology and Oncology

## 2023-07-18 NOTE — Telephone Encounter (Signed)
Patient is aware of scheduled appointment times/dates

## 2023-07-18 NOTE — Assessment & Plan Note (Deleted)
07/13/2018: Right lumpectomy: IDC grade 2, 0.7 cm, cancer does not involve margins, negative for lymphovascular or perineural invasion, lymph nodes not sampled, ER 100%, PR 80%, HER-2 negative, Ki-67 2%, T1B NX stage Ia   Current treatment: Adjuvant antiestrogen therapy with letrozole 2.5 mg daily started Oct 2019 completed July 2023   Breast cancer Surveillance: 1. Mammogram: 07/18/2022: [facility] benign breast density category C 2. Dexa Scan 09/11/18: T score -1.8    RTC on an as-needed basis.

## 2023-07-29 ENCOUNTER — Other Ambulatory Visit: Payer: Self-pay | Admitting: Adult Health

## 2023-07-31 ENCOUNTER — Telehealth: Payer: Self-pay | Admitting: Internal Medicine

## 2023-07-31 NOTE — Telephone Encounter (Signed)
*  STAT* If patient is at the pharmacy, call can be transferred to refill team.   1. Which medications need to be refilled? (please list name of each medication and dose if known) metoprolol succinate (TOPROL-XL) 25 MG 24 hr tablet     4. Which pharmacy/location (including street and city if local pharmacy) is medication to be sent to? [id number] - [place], [place] - [address] AT [facility] [facility] [facility]     5. Do they need a 30 day or 90 day supply? 90

## 2023-08-02 MED ORDER — METOPROLOL SUCCINATE ER 25 MG PO TB24: 25.0000 mg | ORAL_TABLET | Freq: Two times a day (BID) | ORAL | 1 refills | Status: DC

## 2023-08-02 NOTE — Telephone Encounter (Signed)
Refill for Metoprolol has been sent in.

## 2023-08-15 DIAGNOSIS — I739 Peripheral vascular disease, unspecified: Secondary | ICD-10-CM | POA: Diagnosis not present

## 2023-08-15 DIAGNOSIS — M79671 Pain in right foot: Secondary | ICD-10-CM | POA: Diagnosis not present

## 2023-08-15 DIAGNOSIS — M79674 Pain in right toe(s): Secondary | ICD-10-CM | POA: Diagnosis not present

## 2023-08-15 DIAGNOSIS — L110 Acquired keratosis follicularis: Secondary | ICD-10-CM | POA: Diagnosis not present

## 2023-08-15 DIAGNOSIS — M79672 Pain in left foot: Secondary | ICD-10-CM | POA: Diagnosis not present

## 2023-08-15 DIAGNOSIS — M79675 Pain in left toe(s): Secondary | ICD-10-CM | POA: Diagnosis not present

## 2023-09-06 ENCOUNTER — Ambulatory Visit: Payer: PPO

## 2023-09-06 DIAGNOSIS — I495 Sick sinus syndrome: Secondary | ICD-10-CM | POA: Diagnosis not present

## 2023-09-06 DIAGNOSIS — I4891 Unspecified atrial fibrillation: Secondary | ICD-10-CM

## 2023-09-06 LAB — CUP PACEART REMOTE DEVICE CHECK
Battery Impedance: 1840 Ohm
Battery Remaining Longevity: 36 mo
Battery Voltage: 2.75 V
Brady Statistic AP VP Percent: 98 %
Brady Statistic AP VS Percent: 0 %
Brady Statistic AS VP Percent: 2 %
Brady Statistic AS VS Percent: 0 %
Date Time Interrogation Session: 20241120080611
Implantable Lead Connection Status: 753985
Implantable Lead Connection Status: 753985
Implantable Lead Implant Date: 20141009
Implantable Lead Implant Date: 20141009
Implantable Lead Location: 753859
Implantable Lead Location: 753860
Implantable Lead Model: 1944
Implantable Lead Model: 1948
Implantable Pulse Generator Implant Date: 20141009
Lead Channel Impedance Value: 517 Ohm
Lead Channel Impedance Value: 849 Ohm
Lead Channel Pacing Threshold Amplitude: 0.625 V
Lead Channel Pacing Threshold Amplitude: 0.75 V
Lead Channel Pacing Threshold Pulse Width: 0.4 ms
Lead Channel Pacing Threshold Pulse Width: 0.4 ms
Lead Channel Setting Pacing Amplitude: 2 V
Lead Channel Setting Pacing Amplitude: 2.5 V
Lead Channel Setting Pacing Pulse Width: 0.4 ms
Lead Channel Setting Sensing Sensitivity: 5.6 mV
Zone Setting Status: 755011
Zone Setting Status: 755011

## 2023-09-18 DIAGNOSIS — Z299 Encounter for prophylactic measures, unspecified: Secondary | ICD-10-CM | POA: Diagnosis not present

## 2023-09-18 DIAGNOSIS — M25551 Pain in right hip: Secondary | ICD-10-CM | POA: Diagnosis not present

## 2023-09-18 DIAGNOSIS — M542 Cervicalgia: Secondary | ICD-10-CM | POA: Diagnosis not present

## 2023-09-18 DIAGNOSIS — J029 Acute pharyngitis, unspecified: Secondary | ICD-10-CM | POA: Diagnosis not present

## 2023-09-18 DIAGNOSIS — I10 Essential (primary) hypertension: Secondary | ICD-10-CM | POA: Diagnosis not present

## 2023-09-19 ENCOUNTER — Inpatient Hospital Stay: Payer: PPO | Admitting: Hematology and Oncology

## 2023-09-28 ENCOUNTER — Telehealth: Payer: Self-pay | Admitting: Internal Medicine

## 2023-09-28 NOTE — Telephone Encounter (Signed)
Follow Up:     Patient called back and said she finally sent her transmission. She said to please let her know if everything is alright.

## 2023-09-28 NOTE — Telephone Encounter (Signed)
Transmission received.  Transmission WNL.  No episodes noted.  Returned call to Pt.  Advised her pacemaker function was normal.  Pt thanked nurse for call back.

## 2023-09-28 NOTE — Telephone Encounter (Signed)
STAT if patient feels like he/she is going to faint   1. Are you feeling dizzy, lightheaded, or faint right now?   No   2. Have you passed out? No   (If yes move to .SYNCOPECHMG)   3. Do you have any other symptoms?    4. Have you checked your HR and BP (record if available)?   Patient stated she felt dizzy and lightheaded last night. Patient noted she also had a stopped up nose.  Patient stated she took some meclizine (ANTIVERT) tablet 25 mg which helped and wants to know next steps.

## 2023-09-28 NOTE — Telephone Encounter (Signed)
Patient states she was given antivert by her pcp for vertigo in the past and she took some today and it helped.She also reports nasal congestion and she will try some Claritin. She reports he bp at home "is good" but she doesn't write it down. She sent a transmission of her pacemaker in November and is going to send another today.   She wants to be sure it is not her pacemaker making her "swimmy headed" She will schedule her January follow with Dr.Taylor.    I will fyi device clinic and Dr.Taylor.

## 2023-10-02 DIAGNOSIS — R42 Dizziness and giddiness: Secondary | ICD-10-CM | POA: Diagnosis not present

## 2023-10-02 DIAGNOSIS — Z299 Encounter for prophylactic measures, unspecified: Secondary | ICD-10-CM | POA: Diagnosis not present

## 2023-10-02 DIAGNOSIS — I10 Essential (primary) hypertension: Secondary | ICD-10-CM | POA: Diagnosis not present

## 2023-10-02 DIAGNOSIS — I4891 Unspecified atrial fibrillation: Secondary | ICD-10-CM | POA: Diagnosis not present

## 2023-10-02 DIAGNOSIS — I700 Atherosclerosis of aorta: Secondary | ICD-10-CM | POA: Diagnosis not present

## 2023-10-03 NOTE — Progress Notes (Signed)
Remote pacemaker transmission.   

## 2023-10-20 DIAGNOSIS — Z1231 Encounter for screening mammogram for malignant neoplasm of breast: Secondary | ICD-10-CM | POA: Diagnosis not present

## 2023-10-23 ENCOUNTER — Inpatient Hospital Stay: Payer: PPO | Attending: Hematology and Oncology | Admitting: Hematology and Oncology

## 2023-10-23 VITALS — BP 149/75 | HR 87 | Temp 97.2°F | Resp 18 | Ht 65.0 in | Wt 123.2 lb

## 2023-10-23 DIAGNOSIS — Z79811 Long term (current) use of aromatase inhibitors: Secondary | ICD-10-CM | POA: Insufficient documentation

## 2023-10-23 DIAGNOSIS — Z7901 Long term (current) use of anticoagulants: Secondary | ICD-10-CM | POA: Insufficient documentation

## 2023-10-23 DIAGNOSIS — Z79899 Other long term (current) drug therapy: Secondary | ICD-10-CM | POA: Diagnosis not present

## 2023-10-23 DIAGNOSIS — C50411 Malignant neoplasm of upper-outer quadrant of right female breast: Secondary | ICD-10-CM | POA: Diagnosis not present

## 2023-10-23 DIAGNOSIS — Z170 Estrogen receptor positive status [ER+]: Secondary | ICD-10-CM | POA: Diagnosis not present

## 2023-10-23 DIAGNOSIS — Z85828 Personal history of other malignant neoplasm of skin: Secondary | ICD-10-CM | POA: Insufficient documentation

## 2023-10-23 NOTE — Progress Notes (Signed)
 Patient Care Team: Maree Isles, MD as PCP - General (Internal Medicine) Waddell Danelle ORN, MD as Consulting Physician (Cardiology) Ebbie Cough, MD as Consulting Physician (General Surgery) Odean Potts, MD as Consulting Physician (Hematology and Oncology) Dewey Rush, MD as Consulting Physician (Radiation Oncology) Crawford Morna Pickle, NP as Nurse Practitioner (Hematology and Oncology)  DIAGNOSIS:  Encounter Diagnosis  Name Primary?   Malignant neoplasm of upper-outer quadrant of right breast in female, estrogen receptor positive (HCC) Yes    SUMMARY OF ONCOLOGIC HISTORY: Oncology History  Malignant neoplasm of upper-outer quadrant of right breast in female, estrogen receptor positive (HCC)  06/11/2018 Initial Diagnosis   Screening detected right breast asymmetry posteriorly measuring 0.3 cm at 12 o'clock position.  Biopsy revealed grade 1-2 invasive ductal carcinoma ER 100%, PR 80%, Ki-67 2%, HER-2 negative, T1 a N0 stage Ia   07/13/2018 Surgery   Right lumpectomy: IDC grade 2, 0.7 cm, cancer does not involve margins, negative for lymphovascular or perineural invasion, lymph nodes not sampled, ER 100%, PR 80%, HER-2 negative, Ki-67 2%, T1B NX stage Ia   07/25/2018 Cancer Staging   Staging form: Breast, AJCC 8th Edition - Pathologic: Stage IA (pT1b, pN0, cM0, G2, ER+, PR+, HER2-) - Signed by Crawford Morna Pickle, NP on 07/25/2018   07/2018 -  Anti-estrogen oral therapy   Letrozole  daily     CHIEF COMPLIANT: Surveillance of breast cancer  HISTORY OF PRESENT ILLNESS:   History of Present Illness   Jessica Myers, a patient with a history of breast cancer and a hip fracture, presents for a follow-up visit. It has been about a year since her last visit. She reports that she has been struggling to recover from her hip fracture, which occurred a year ago. She has difficulty walking and her surgeon informed her that her right leg, where the fracture occurred, is about a quarter  of an inch shorter than her left leg. Despite using shoe inserts to help lift her foot, she has not noticed significant improvement and is considering seeking help from a shoe store.  In addition to her physical health concerns, Jessica Myers has also experienced significant personal loss. Her husband passed away three months ago after a fall at home resulted in broken ribs, a spot on his kidney, and a blood clot behind his kidney. His health rapidly declined and he passed away within five days of the fall.  Jessica Myers also mentions a history of skin cancer on her leg, which was removed by her doctor. She had a recent mammogram and is awaiting the results. She expresses a desire to continue with regular mammograms due to her family history of cancer.         ALLERGIES:  is allergic to biaxin [clarithromycin], penicillins, shellfish allergy, sulfa antibiotics, and celebrex [celecoxib].  MEDICATIONS:  Current Outpatient Medications  Medication Sig Dispense Refill   Acetaminophen  325 MG CAPS Take 650 mg by mouth every 6 (six) hours.     apixaban  (ELIQUIS ) 2.5 MG TABS tablet Take 1 tablet (2.5 mg total) by mouth 2 (two) times daily. 60 tablet 0   atorvastatin  (LIPITOR) 10 MG tablet Take 1 tablet (10 mg total) by mouth every evening. 30 tablet 0   calcium  carbonate (OSCAL) 1500 (600 Ca) MG TABS tablet Take by mouth 2 (two) times daily with a meal.     cholecalciferol  (VITAMIN D ) 1000 UNITS tablet Take 1,000 Units by mouth daily.      flecainide  (TAMBOCOR ) 150 MG tablet TAKE HALF A TABLET (75  MG TOTAL) BY MOUTH 2 (TWO) TIMES DAILY 90 tablet 3   KRILL OIL PO Take by mouth. (Patient not taking: Reported on 12/13/2022)     levothyroxine  (SYNTHROID ) 112 MCG tablet Take 1 tablet (112 mcg total) by mouth daily before breakfast. 30 tablet 0   lisinopril  (ZESTRIL ) 20 MG tablet Take 1 tablet (20 mg total) by mouth daily. 30 tablet 0   metoprolol  succinate (TOPROL -XL) 25 MG 24 hr tablet Take 1 tablet (25 mg total) by  mouth 2 (two) times daily. 180 tablet 1   Multiple Vitamins-Minerals (PRESERVISION AREDS PO) Take 1 tablet by mouth daily. (Patient not taking: Reported on 12/13/2022)     omeprazole (PRILOSEC) 20 MG capsule Take 20 mg by mouth at bedtime.  (Patient not taking: Reported on 12/13/2022)     Polyethyl Glycol-Propyl Glycol 0.4-0.3 % SOLN Place 1 drop into both eyes 3 (three) times daily as needed (for dry eyes.).     polyethylene glycol (MIRALAX  / GLYCOLAX ) packet Take 17 g by mouth daily as needed (for constipation.).      simethicone  (MYLICON) 125 MG chewable tablet Chew 125-250 mg by mouth every 6 (six) hours as needed for flatulence.     No current facility-administered medications for this visit.    PHYSICAL EXAMINATION: ECOG PERFORMANCE STATUS: 1 - Symptomatic but completely ambulatory  Vitals:   10/23/23 1434  BP: (!) 149/75  Pulse: 87  Resp: 18  Temp: (!) 97.2 F (36.2 C)  SpO2: 99%   Filed Weights   10/23/23 1434  Weight: 123 lb 3.2 oz (55.9 kg)    Physical Exam   BREAST: No abnormalities, masses, or tenderness.      (exam performed in the presence of a chaperone)  LABORATORY DATA:  I have reviewed the data as listed    Latest Ref Rng & Units 08/29/2022    8:10 AM 08/22/2022    8:00 AM 08/12/2022    4:46 AM  CMP  Glucose 70 - 99 mg/dL 87  87  876   BUN 8 - 23 mg/dL 29  38  21   Creatinine 0.44 - 1.00 mg/dL 8.61  8.73  8.94   Sodium 135 - 145 mmol/L 140  137  137   Potassium 3.5 - 5.1 mmol/L 4.4  4.3  4.1   Chloride 98 - 111 mmol/L 107  104  103   CO2 22 - 32 mmol/L 26  26  25    Calcium  8.9 - 10.3 mg/dL 9.4  9.4  9.1     Lab Results  Component Value Date   WBC 6.2 08/22/2022   HGB 11.4 (L) 08/22/2022   HCT 34.7 (L) 08/22/2022   MCV 95.1 08/22/2022   PLT 255 08/22/2022   NEUTROABS 3.0 06/20/2018    ASSESSMENT & PLAN:  Malignant neoplasm of upper-outer quadrant of right breast in female, estrogen receptor positive (HCC) 07/13/2018: Right lumpectomy: IDC  grade 2, 0.7 cm, cancer does not involve margins, negative for lymphovascular or perineural invasion, lymph nodes not sampled, ER 100%, PR 80%, HER-2 negative, Ki-67 2%, T1B NX stage Ia   Prior treatment: Adjuvant antiestrogen therapy with letrozole  2.5 mg daily started Oct 2019 completed July 2023   Breast cancer Surveillance: 1. Mammogram: 07/06/2020: [facility] benign breast density category B 2. Dexa Scan 09/11/18: T score -1.8   3.  Breast exam 04/28/2022: Benign   Right hip fracture 08/09/2022 status post surgery      Hip Fracture Persistent difficulty walking and imbalance one year  post-fracture. Right leg reported to be shorter than the left. -Consider evaluation by a physical therapist for gait training and balance exercises. -Recommend shoe modification to address leg length discrepancy.  Breast Cancer Five years and four months post-diagnosis, completed treatment. Recent mammogram performed with no reported abnormalities. -Continue annual mammograms as tolerated. -Annual follow-up visits for clinical breast exams.  Bereavement Recent loss of husband due to traumatic accident and subsequent complications.  Return to clinic in 1 year for follow-up for breast exams.  Patient would like to follow up once a year.    No orders of the defined types were placed in this encounter.  The patient has a good understanding of the overall plan. she agrees with it. she will call with any problems that may develop before the next visit here. Total time spent: 30 mins including face to face time and time spent for planning, charting and co-ordination of care   Jessica MARLA Chad, MD 10/23/23

## 2023-10-23 NOTE — Assessment & Plan Note (Signed)
 07/13/2018: Right lumpectomy: IDC grade 2, 0.7 cm, cancer does not involve margins, negative for lymphovascular or perineural invasion, lymph nodes not sampled, ER 100%, PR 80%, HER-2 negative, Ki-67 2%, T1B NX stage Ia   Prior treatment: Adjuvant antiestrogen therapy with letrozole  2.5 mg daily started Oct 2019 completed July 2023   Breast cancer Surveillance: 1. Mammogram: 07/06/2020: [facility] benign breast density category B 2. Dexa Scan 09/11/18: T score -1.8   3.  Breast exam 04/28/2022: Benign   Right hip fracture 08/09/2022 status post surgery RTC on an as-needed basis.

## 2023-11-13 DIAGNOSIS — M79674 Pain in right toe(s): Secondary | ICD-10-CM | POA: Diagnosis not present

## 2023-11-13 DIAGNOSIS — I739 Peripheral vascular disease, unspecified: Secondary | ICD-10-CM | POA: Diagnosis not present

## 2023-11-13 DIAGNOSIS — M79675 Pain in left toe(s): Secondary | ICD-10-CM | POA: Diagnosis not present

## 2023-11-13 DIAGNOSIS — L110 Acquired keratosis follicularis: Secondary | ICD-10-CM | POA: Diagnosis not present

## 2023-11-13 DIAGNOSIS — M79671 Pain in right foot: Secondary | ICD-10-CM | POA: Diagnosis not present

## 2023-11-13 DIAGNOSIS — M79672 Pain in left foot: Secondary | ICD-10-CM | POA: Diagnosis not present

## 2023-11-15 ENCOUNTER — Encounter: Payer: Self-pay | Admitting: Internal Medicine

## 2023-11-15 ENCOUNTER — Ambulatory Visit: Payer: PPO | Attending: Internal Medicine | Admitting: Internal Medicine

## 2023-11-15 VITALS — BP 130/72 | HR 77 | Ht 64.0 in | Wt 126.0 lb

## 2023-11-15 DIAGNOSIS — I4819 Other persistent atrial fibrillation: Secondary | ICD-10-CM | POA: Diagnosis not present

## 2023-11-15 DIAGNOSIS — I10 Essential (primary) hypertension: Secondary | ICD-10-CM | POA: Diagnosis not present

## 2023-11-15 LAB — CUP PACEART INCLINIC DEVICE CHECK
Battery Impedance: 1926 Ohm
Battery Remaining Longevity: 35 mo
Battery Voltage: 2.75 V
Brady Statistic AP VP Percent: 99 %
Brady Statistic AP VS Percent: 0 %
Brady Statistic AS VP Percent: 1 %
Brady Statistic AS VS Percent: 0 %
Date Time Interrogation Session: 20250129200928
Implantable Lead Connection Status: 753985
Implantable Lead Connection Status: 753985
Implantable Lead Implant Date: 20141009
Implantable Lead Implant Date: 20141009
Implantable Lead Location: 753859
Implantable Lead Location: 753860
Implantable Lead Model: 1944
Implantable Lead Model: 1948
Implantable Pulse Generator Implant Date: 20141009
Lead Channel Impedance Value: 600 Ohm
Lead Channel Impedance Value: 842 Ohm
Lead Channel Pacing Threshold Amplitude: 0.625 V
Lead Channel Pacing Threshold Amplitude: 0.75 V
Lead Channel Pacing Threshold Amplitude: 0.75 V
Lead Channel Pacing Threshold Amplitude: 1 V
Lead Channel Pacing Threshold Pulse Width: 0.4 ms
Lead Channel Pacing Threshold Pulse Width: 0.4 ms
Lead Channel Pacing Threshold Pulse Width: 0.4 ms
Lead Channel Pacing Threshold Pulse Width: 0.4 ms
Lead Channel Sensing Intrinsic Amplitude: 11.2 mV
Lead Channel Sensing Intrinsic Amplitude: 2.8 mV
Lead Channel Setting Pacing Amplitude: 2 V
Lead Channel Setting Pacing Amplitude: 2.5 V
Lead Channel Setting Pacing Pulse Width: 0.4 ms
Lead Channel Setting Sensing Sensitivity: 5.6 mV
Zone Setting Status: 755011
Zone Setting Status: 755011

## 2023-11-15 NOTE — Progress Notes (Signed)
HPI Jessica Myers returns today for followup. She is a pleasant 89 yo woman with a h/o sinus node dysfunction, s/p PPM insertion, PAF, and HTN. In the interim, she has done well except that she has occaisional palpitations for which she will take an extra toprol. No syncope. No palpitations . The patient has fallen and broken her femur and had surgery. She is improved. She gets around with a walker. She notes that her husband died about 4 months ago after falling.  Allergies  Allergen Reactions   Biaxin [Clarithromycin] Shortness Of Breath and Rash    Unknown   Penicillins Hives    Has patient had a PCN reaction causing immediate rash, facial/tongue/throat swelling, SOB or lightheadedness with hypotension: Yes Has patient had a PCN reaction causing severe rash involving mucus membranes or skin necrosis: No Has patient had a PCN reaction that required hospitalization No Has patient had a PCN reaction occurring within the last 10 years: No If all of the above answers are "NO", then may proceed with Cephalosporin use. ANCEF on 08/11/22 without issue     Shellfish Allergy Nausea And Vomiting   Sulfa Antibiotics Other (See Comments)    "bad reaction," per patient   Celebrex [Celecoxib] Rash     Current Outpatient Medications  Medication Sig Dispense Refill   Acetaminophen 325 MG CAPS Take 650 mg by mouth every 6 (six) hours.     apixaban (ELIQUIS) 2.5 MG TABS tablet Take 1 tablet (2.5 mg total) by mouth 2 (two) times daily. 60 tablet 0   atorvastatin (LIPITOR) 10 MG tablet Take 1 tablet (10 mg total) by mouth every evening. 30 tablet 0   cholecalciferol (VITAMIN D) 1000 UNITS tablet Take 1,000 Units by mouth daily.      flecainide (TAMBOCOR) 150 MG tablet TAKE HALF A TABLET (75 MG TOTAL) BY MOUTH 2 (TWO) TIMES DAILY 90 tablet 3   levothyroxine (SYNTHROID) 112 MCG tablet Take 1 tablet (112 mcg total) by mouth daily before breakfast. 30 tablet 0   lisinopril (ZESTRIL) 20 MG tablet Take  1 tablet (20 mg total) by mouth daily. 30 tablet 0   metoprolol succinate (TOPROL-XL) 25 MG 24 hr tablet Take 1 tablet (25 mg total) by mouth 2 (two) times daily. 180 tablet 1   Polyethyl Glycol-Propyl Glycol 0.4-0.3 % SOLN Place 1 drop into both eyes 3 (three) times daily as needed (for dry eyes.).     polyethylene glycol (MIRALAX / GLYCOLAX) packet Take 17 g by mouth daily as needed (for constipation.).      simethicone (MYLICON) 125 MG chewable tablet Chew 125-250 mg by mouth every 6 (six) hours as needed for flatulence.     No current facility-administered medications for this visit.     Past Medical History:  Diagnosis Date   A-fib [facility])    Anxiety    Bradycardia    s/p PPM October 2014   Chronic anticoagulation    Dysrhythmia    Headache(784.0)    High cholesterol    Hypertension    Malignant neoplasm of upper-outer quadrant of right breast in female, estrogen receptor positive (HCC) 06/15/2018   Multiple rib fractures 07/01/2016   fell down stairs    Pulmonary contusion 06/2016   from a fall    Stroke [facility]) September 2014   thrombolytic therapy   Thyroid disease    Vertical vertigo     ROS:   All systems reviewed and negative except as noted in the HPI.  Past Surgical History:  Procedure Laterality Date   BREAST LUMPECTOMY WITH RADIOACTIVE SEED LOCALIZATION Right 07/13/2018   Procedure: BREAST LUMPECTOMY WITH RADIOACTIVE SEED LOCALIZATION;  Surgeon: Emelia Loron, MD;  Location: [facility] OR;  Service: General;  Laterality: Right;   CHOLECYSTECTOMY     INSERT / REPLACE / REMOVE PACEMAKER     ORIF HIP FRACTURE Right 08/11/2022   Procedure: OPEN REDUCTION INTERNAL FIXATION HIP;  Surgeon: Oliver Barre, MD;  Location: [facility] ORS;  Service: Orthopedics;  Laterality: Right;   PACEMAKER INSERTION     PERMANENT PACEMAKER INSERTION N/A 07/25/2013   Procedure: PERMANENT PACEMAKER INSERTION;  Surgeon: Duke Salvia, MD;  Location: [facility] CATH LAB;  Service: Cardiovascular;   Laterality: N/A;   TEE WITHOUT CARDIOVERSION N/A 07/16/2013   Procedure: TRANSESOPHAGEAL ECHOCARDIOGRAM (TEE);  Surgeon: Vesta Mixer, MD;  Location: [facility] ENDOSCOPY;  Service: Cardiovascular;  Laterality: N/A;     Family History  Problem Relation Age of Onset   Cancer Mother    Breast cancer Mother    Cancer Father    Lung cancer Father      Social History   Socioeconomic History   Marital status: Married    Spouse name: Jessica Myers   Number of children: 2   Years of education: 12   Highest education level: Not on file  Occupational History   Occupation: retired  Tobacco Use   Smoking status: Never   Smokeless tobacco: Never  Vaping Use   Vaping status: Never Used  Substance and Sexual Activity   Alcohol use: No    Alcohol/week: 0.0 standard drinks of alcohol   Drug use: No   Sexual activity: Never  Other Topics Concern   Not on file  Social History Narrative   Patient lives at home with her husband.   Social Drivers of Corporate investment banker Strain: Not on file  Food Insecurity: No Food Insecurity (08/10/2022)   Hunger Vital Sign    Worried About Running Out of Food in the Last Year: Never true    Ran Out of Food in the Last Year: Never true  Transportation Needs: No Transportation Needs (08/10/2022)   PRAPARE - Administrator, Civil Service (Medical): No    Lack of Transportation (Non-Medical): No  Physical Activity: Not on file  Stress: Not on file  Social Connections: Not on file  Intimate Partner Violence: Not At Risk (08/10/2022)   Humiliation, Afraid, Rape, and Kick questionnaire    Fear of Current or Ex-Partner: No    Emotionally Abused: No    Physically Abused: No    Sexually Abused: No     BP 130/72   Pulse 77   Ht 5\' 4"  (1.626 m)   Wt 126 lb (57.2 kg)   SpO2 96%   BMI 21.63 kg/m   Physical Exam:  Well appearing NAD HEENT: Unremarkable Neck:  No JVD, no thyromegally Lymphatics:  No adenopathy Back:  No CVA  tenderness Lungs:  Clear with no wheezes HEART:  Regular rate rhythm, no murmurs, no rubs, no clicks Abd:  soft, positive bowel sounds, no organomegally, no rebound, no guarding Ext:  2 plus pulses, no edema, no cyanosis, no clubbing Skin:  No rashes no nodules Neuro:  CN II through XII intact, motor grossly intact  EKG - nsr with AV pacing  DEVICE  Normal device function.  See PaceArt for details.   Assess/Plan:  Sinus node dysfunction - the patient is pacing 99% in the [facility]  block - she has developed CHB and is pacer dependent.  PPM - her medtronic DDD PM is working normally. We will recheck in several years. Coags - she has had her dose of eliquis decreased due to gradual weight loss. We will follow.   Sharlot Gowda Bronco Mcgrory,MD

## 2023-11-15 NOTE — Patient Instructions (Signed)

## 2023-11-20 ENCOUNTER — Telehealth: Payer: Self-pay | Admitting: Internal Medicine

## 2023-11-20 NOTE — Telephone Encounter (Signed)
Patient is calling because she is waiting for a new transmission device. Patient would like to know if she should send a transmission when she gets in 7 business days or should she wait until 12/06/23. Please advise.

## 2023-11-22 NOTE — Telephone Encounter (Signed)
 I spoke with the pt and she is receiving a new monitor in 7 business days.She can send a transmission on 12/06/2023.

## 2023-12-06 ENCOUNTER — Ambulatory Visit (INDEPENDENT_AMBULATORY_CARE_PROVIDER_SITE_OTHER): Payer: PPO

## 2023-12-06 DIAGNOSIS — I495 Sick sinus syndrome: Secondary | ICD-10-CM | POA: Diagnosis not present

## 2023-12-07 ENCOUNTER — Encounter: Payer: Self-pay | Admitting: Internal Medicine

## 2023-12-07 LAB — CUP PACEART REMOTE DEVICE CHECK
Battery Impedance: 1971 Ohm
Battery Remaining Longevity: 33 mo
Battery Voltage: 2.74 V
Brady Statistic AP VP Percent: 100 %
Brady Statistic AP VS Percent: 0 %
Brady Statistic AS VP Percent: 0 %
Brady Statistic AS VS Percent: 0 %
Date Time Interrogation Session: 20250219083721
Implantable Lead Connection Status: 753985
Implantable Lead Connection Status: 753985
Implantable Lead Implant Date: 20141009
Implantable Lead Implant Date: 20141009
Implantable Lead Location: 753859
Implantable Lead Location: 753860
Implantable Lead Model: 1944
Implantable Lead Model: 1948
Implantable Pulse Generator Implant Date: 20141009
Lead Channel Impedance Value: 541 Ohm
Lead Channel Impedance Value: 865 Ohm
Lead Channel Pacing Threshold Amplitude: 0.75 V
Lead Channel Pacing Threshold Amplitude: 0.75 V
Lead Channel Pacing Threshold Pulse Width: 0.4 ms
Lead Channel Pacing Threshold Pulse Width: 0.4 ms
Lead Channel Setting Pacing Amplitude: 2 V
Lead Channel Setting Pacing Amplitude: 2.5 V
Lead Channel Setting Pacing Pulse Width: 0.4 ms
Lead Channel Setting Sensing Sensitivity: 5.6 mV
Zone Setting Status: 755011
Zone Setting Status: 755011

## 2023-12-10 ENCOUNTER — Other Ambulatory Visit: Payer: Self-pay | Admitting: Internal Medicine

## 2023-12-19 DIAGNOSIS — M7062 Trochanteric bursitis, left hip: Secondary | ICD-10-CM | POA: Diagnosis not present

## 2023-12-19 DIAGNOSIS — Z299 Encounter for prophylactic measures, unspecified: Secondary | ICD-10-CM | POA: Diagnosis not present

## 2023-12-19 DIAGNOSIS — I4891 Unspecified atrial fibrillation: Secondary | ICD-10-CM | POA: Diagnosis not present

## 2023-12-19 DIAGNOSIS — H6120 Impacted cerumen, unspecified ear: Secondary | ICD-10-CM | POA: Diagnosis not present

## 2023-12-19 DIAGNOSIS — I10 Essential (primary) hypertension: Secondary | ICD-10-CM | POA: Diagnosis not present

## 2023-12-19 DIAGNOSIS — M25551 Pain in right hip: Secondary | ICD-10-CM | POA: Diagnosis not present

## 2024-01-02 DIAGNOSIS — M7989 Other specified soft tissue disorders: Secondary | ICD-10-CM | POA: Diagnosis not present

## 2024-01-02 DIAGNOSIS — M47816 Spondylosis without myelopathy or radiculopathy, lumbar region: Secondary | ICD-10-CM | POA: Diagnosis not present

## 2024-01-02 DIAGNOSIS — R52 Pain, unspecified: Secondary | ICD-10-CM | POA: Diagnosis not present

## 2024-01-02 DIAGNOSIS — M160 Bilateral primary osteoarthritis of hip: Secondary | ICD-10-CM | POA: Diagnosis not present

## 2024-01-02 DIAGNOSIS — S3992XA Unspecified injury of lower back, initial encounter: Secondary | ICD-10-CM | POA: Diagnosis not present

## 2024-01-02 DIAGNOSIS — M549 Dorsalgia, unspecified: Secondary | ICD-10-CM | POA: Diagnosis not present

## 2024-01-02 DIAGNOSIS — M79631 Pain in right forearm: Secondary | ICD-10-CM | POA: Diagnosis not present

## 2024-01-02 DIAGNOSIS — Z299 Encounter for prophylactic measures, unspecified: Secondary | ICD-10-CM | POA: Diagnosis not present

## 2024-01-02 DIAGNOSIS — M545 Low back pain, unspecified: Secondary | ICD-10-CM | POA: Diagnosis not present

## 2024-01-02 DIAGNOSIS — N183 Chronic kidney disease, stage 3 unspecified: Secondary | ICD-10-CM | POA: Diagnosis not present

## 2024-01-02 DIAGNOSIS — I10 Essential (primary) hypertension: Secondary | ICD-10-CM | POA: Diagnosis not present

## 2024-01-02 DIAGNOSIS — I4891 Unspecified atrial fibrillation: Secondary | ICD-10-CM | POA: Diagnosis not present

## 2024-01-08 DIAGNOSIS — I129 Hypertensive chronic kidney disease with stage 1 through stage 4 chronic kidney disease, or unspecified chronic kidney disease: Secondary | ICD-10-CM | POA: Diagnosis not present

## 2024-01-08 DIAGNOSIS — Z7901 Long term (current) use of anticoagulants: Secondary | ICD-10-CM | POA: Diagnosis not present

## 2024-01-08 DIAGNOSIS — M545 Low back pain, unspecified: Secondary | ICD-10-CM | POA: Diagnosis not present

## 2024-01-08 DIAGNOSIS — Z9049 Acquired absence of other specified parts of digestive tract: Secondary | ICD-10-CM | POA: Diagnosis not present

## 2024-01-08 DIAGNOSIS — E7800 Pure hypercholesterolemia, unspecified: Secondary | ICD-10-CM | POA: Diagnosis not present

## 2024-01-08 DIAGNOSIS — N183 Chronic kidney disease, stage 3 unspecified: Secondary | ICD-10-CM | POA: Diagnosis not present

## 2024-01-08 DIAGNOSIS — I700 Atherosclerosis of aorta: Secondary | ICD-10-CM | POA: Diagnosis not present

## 2024-01-08 DIAGNOSIS — Z950 Presence of cardiac pacemaker: Secondary | ICD-10-CM | POA: Diagnosis not present

## 2024-01-08 DIAGNOSIS — E441 Mild protein-calorie malnutrition: Secondary | ICD-10-CM | POA: Diagnosis not present

## 2024-01-08 DIAGNOSIS — G2581 Restless legs syndrome: Secondary | ICD-10-CM | POA: Diagnosis not present

## 2024-01-08 DIAGNOSIS — I4891 Unspecified atrial fibrillation: Secondary | ICD-10-CM | POA: Diagnosis not present

## 2024-01-08 DIAGNOSIS — Z853 Personal history of malignant neoplasm of breast: Secondary | ICD-10-CM | POA: Diagnosis not present

## 2024-01-08 DIAGNOSIS — M1710 Unilateral primary osteoarthritis, unspecified knee: Secondary | ICD-10-CM | POA: Diagnosis not present

## 2024-01-08 DIAGNOSIS — H6120 Impacted cerumen, unspecified ear: Secondary | ICD-10-CM | POA: Diagnosis not present

## 2024-01-08 DIAGNOSIS — M8580 Other specified disorders of bone density and structure, unspecified site: Secondary | ICD-10-CM | POA: Diagnosis not present

## 2024-01-10 NOTE — Progress Notes (Signed)
 Remote pacemaker transmission.

## 2024-01-10 NOTE — Addendum Note (Signed)
 Addended by: Elease Etienne A on: 01/10/2024 11:51 AM   Modules accepted: Orders

## 2024-01-17 ENCOUNTER — Ambulatory Visit: Admitting: Orthopedic Surgery

## 2024-01-17 ENCOUNTER — Encounter: Payer: Self-pay | Admitting: Orthopedic Surgery

## 2024-01-17 DIAGNOSIS — S72001A Fracture of unspecified part of neck of right femur, initial encounter for closed fracture: Secondary | ICD-10-CM

## 2024-01-17 DIAGNOSIS — M5431 Sciatica, right side: Secondary | ICD-10-CM

## 2024-01-17 NOTE — Patient Instructions (Signed)
 Please contact the clinic if you are interested in scheduling an injection

## 2024-01-18 DIAGNOSIS — I129 Hypertensive chronic kidney disease with stage 1 through stage 4 chronic kidney disease, or unspecified chronic kidney disease: Secondary | ICD-10-CM | POA: Diagnosis not present

## 2024-01-18 DIAGNOSIS — I4891 Unspecified atrial fibrillation: Secondary | ICD-10-CM | POA: Diagnosis not present

## 2024-01-18 DIAGNOSIS — M545 Low back pain, unspecified: Secondary | ICD-10-CM | POA: Diagnosis not present

## 2024-01-18 DIAGNOSIS — N183 Chronic kidney disease, stage 3 unspecified: Secondary | ICD-10-CM | POA: Diagnosis not present

## 2024-01-18 NOTE — Progress Notes (Signed)
 Orthopaedic Postop Note  Assessment: Jessica Myers is a 89 y.o. female s/p CRP of right femoral neck fracture  DOS: 08/11/2022  Plan: Jessica Myers has recent onset of pain in the right buttock.  This is limiting her mobility.  She has tried medications such as tramadol and a muscle relaxer, which just make her sleepy.  Tylenol is effective, but wears off quickly.  We discussed multiple potential treatment options.  Pain is most consistent with irritation of the sciatic nerve in her right buttock.  She can consider an ultrasound-guided injection.  For now she will attempt to adjust her Tylenol, and plan to take an extended release formula.  If they wish to proceed with an injection, they will contact the clinic.   Follow-up: Return if symptoms worsen or fail to improve. XR at next visit: AP pelvis and right hip x-ray  Subjective:  Chief Complaint  Patient presents with   Back Pain    Lower back pain-started in left leg then to groin and then in right side. Taking tylenol for pain    History of Present Illness: Jessica Myers is a 89 y.o. female who presents following the above stated procedure.  She is familiar to my clinic, having undergone a right hip CRPP greater than 18 months ago.  Her fracture was slow to heal, with some varus angulation.  According to her daughter, she had been doing quite well.  She was moving with minimal difficulty.  Approximately 1-2 months ago, she started to note some pain in the right side of her lower back, and into the buttock.  Around that time, she was very active, moving furniture around the house.  She started to experience debilitating pain within the right buttock.  She has been taking Tylenol, which helps.  In addition, she has tried tramadol, and a muscle relaxer.  Unfortunately, the medications made her very sleepy.  No specific injury.  Of note, her husband passed away last fall.  Review of Systems: No fevers or chills No numbness or  tingling No Chest Pain No shortness of breath   Objective: There were no vitals taken for this visit.  Physical Exam:  Elderly female.  No acute distress.  She is seated in a wheelchair.  She has intact sensation distally.  She has some pain with internal and external rotation of the hip, but this pain is primarily in the buttock.  She has no tenderness to palpation over the lateral hip.  IMAGING: I personally ordered and reviewed the following images:  No new imaging obtained today.  Recent images are available in Care Everywhere.  She has some diffuse degenerative changes within the lumbar spine.  Femoral neck fracture remains in stable alignment.  Screws are not backing out  Oliver Barre, MD 01/18/2024 8:45 AM

## 2024-01-24 DIAGNOSIS — R52 Pain, unspecified: Secondary | ICD-10-CM | POA: Diagnosis not present

## 2024-01-24 DIAGNOSIS — E559 Vitamin D deficiency, unspecified: Secondary | ICD-10-CM | POA: Diagnosis not present

## 2024-01-24 DIAGNOSIS — E7800 Pure hypercholesterolemia, unspecified: Secondary | ICD-10-CM | POA: Diagnosis not present

## 2024-01-24 DIAGNOSIS — Z79899 Other long term (current) drug therapy: Secondary | ICD-10-CM | POA: Diagnosis not present

## 2024-01-24 DIAGNOSIS — Z299 Encounter for prophylactic measures, unspecified: Secondary | ICD-10-CM | POA: Diagnosis not present

## 2024-01-24 DIAGNOSIS — Z111 Encounter for screening for respiratory tuberculosis: Secondary | ICD-10-CM | POA: Diagnosis not present

## 2024-01-24 DIAGNOSIS — Z1331 Encounter for screening for depression: Secondary | ICD-10-CM | POA: Diagnosis not present

## 2024-01-24 DIAGNOSIS — I10 Essential (primary) hypertension: Secondary | ICD-10-CM | POA: Diagnosis not present

## 2024-01-24 DIAGNOSIS — R7611 Nonspecific reaction to tuberculin skin test without active tuberculosis: Secondary | ICD-10-CM | POA: Diagnosis not present

## 2024-01-24 DIAGNOSIS — R5383 Other fatigue: Secondary | ICD-10-CM | POA: Diagnosis not present

## 2024-01-24 DIAGNOSIS — Z7189 Other specified counseling: Secondary | ICD-10-CM | POA: Diagnosis not present

## 2024-01-24 DIAGNOSIS — Z1339 Encounter for screening examination for other mental health and behavioral disorders: Secondary | ICD-10-CM | POA: Diagnosis not present

## 2024-01-24 DIAGNOSIS — Z0000 Encounter for general adult medical examination without abnormal findings: Secondary | ICD-10-CM | POA: Diagnosis not present

## 2024-02-29 DIAGNOSIS — R42 Dizziness and giddiness: Secondary | ICD-10-CM | POA: Diagnosis not present

## 2024-02-29 DIAGNOSIS — I10 Essential (primary) hypertension: Secondary | ICD-10-CM | POA: Diagnosis not present

## 2024-02-29 DIAGNOSIS — I4891 Unspecified atrial fibrillation: Secondary | ICD-10-CM | POA: Diagnosis not present

## 2024-02-29 DIAGNOSIS — E441 Mild protein-calorie malnutrition: Secondary | ICD-10-CM | POA: Diagnosis not present

## 2024-02-29 DIAGNOSIS — N1831 Chronic kidney disease, stage 3a: Secondary | ICD-10-CM | POA: Diagnosis not present

## 2024-02-29 DIAGNOSIS — Z299 Encounter for prophylactic measures, unspecified: Secondary | ICD-10-CM | POA: Diagnosis not present

## 2024-03-06 ENCOUNTER — Ambulatory Visit (INDEPENDENT_AMBULATORY_CARE_PROVIDER_SITE_OTHER): Payer: PPO

## 2024-03-06 DIAGNOSIS — I495 Sick sinus syndrome: Secondary | ICD-10-CM

## 2024-03-06 LAB — CUP PACEART REMOTE DEVICE CHECK
Battery Impedance: 2033 Ohm
Battery Remaining Longevity: 32 mo
Battery Voltage: 2.74 V
Brady Statistic AP VP Percent: 100 %
Brady Statistic AP VS Percent: 0 %
Brady Statistic AS VP Percent: 0 %
Brady Statistic AS VS Percent: 0 %
Date Time Interrogation Session: 20250521081240
Implantable Lead Connection Status: 753985
Implantable Lead Connection Status: 753985
Implantable Lead Implant Date: 20141009
Implantable Lead Implant Date: 20141009
Implantable Lead Location: 753859
Implantable Lead Location: 753860
Implantable Lead Model: 1944
Implantable Lead Model: 1948
Implantable Pulse Generator Implant Date: 20141009
Lead Channel Impedance Value: 540 Ohm
Lead Channel Impedance Value: 865 Ohm
Lead Channel Pacing Threshold Amplitude: 0.625 V
Lead Channel Pacing Threshold Amplitude: 0.875 V
Lead Channel Pacing Threshold Pulse Width: 0.4 ms
Lead Channel Pacing Threshold Pulse Width: 0.4 ms
Lead Channel Setting Pacing Amplitude: 2 V
Lead Channel Setting Pacing Amplitude: 2.5 V
Lead Channel Setting Pacing Pulse Width: 0.4 ms
Lead Channel Setting Sensing Sensitivity: 5.6 mV
Zone Setting Status: 755011
Zone Setting Status: 755011

## 2024-03-10 ENCOUNTER — Ambulatory Visit: Payer: Self-pay | Admitting: Internal Medicine

## 2024-03-11 ENCOUNTER — Telehealth: Payer: Self-pay | Admitting: Home Health

## 2024-03-11 NOTE — Telephone Encounter (Signed)
 Patient called after hour line, states she had 2 bruises on her leg, one by the knee and one by the ankle. She does not know how she got them. She has been using ice pack for a week. She felt that they are not improving and also "spreading". She is wondering what's causing them and wondering if she should continue Eliquis . Explained to the patient that there is limited assessment can be done over the phone. If these bruises are worsening without clear cause, she should be evaluated in person. She has agreed to go to the nearest urgent care as all offices are closed today and unclear if her PCP or Dr Carolynne Citron office can get her in right away tomorrow. She also mentioned her PPM battery is due for change soon and wishes Dr Carolynne Citron would do it before he retires. I encouraged her to make an appt for this and will forward this message to Dr Carolynne Citron.

## 2024-03-12 DIAGNOSIS — L600 Ingrowing nail: Secondary | ICD-10-CM | POA: Diagnosis not present

## 2024-03-12 DIAGNOSIS — Z299 Encounter for prophylactic measures, unspecified: Secondary | ICD-10-CM | POA: Diagnosis not present

## 2024-03-12 DIAGNOSIS — M79674 Pain in right toe(s): Secondary | ICD-10-CM | POA: Diagnosis not present

## 2024-03-12 DIAGNOSIS — I10 Essential (primary) hypertension: Secondary | ICD-10-CM | POA: Diagnosis not present

## 2024-03-12 DIAGNOSIS — T148XXA Other injury of unspecified body region, initial encounter: Secondary | ICD-10-CM | POA: Diagnosis not present

## 2024-03-12 DIAGNOSIS — M79671 Pain in right foot: Secondary | ICD-10-CM | POA: Diagnosis not present

## 2024-03-12 DIAGNOSIS — L110 Acquired keratosis follicularis: Secondary | ICD-10-CM | POA: Diagnosis not present

## 2024-03-12 DIAGNOSIS — R42 Dizziness and giddiness: Secondary | ICD-10-CM | POA: Diagnosis not present

## 2024-03-13 DIAGNOSIS — I4891 Unspecified atrial fibrillation: Secondary | ICD-10-CM | POA: Diagnosis not present

## 2024-03-13 DIAGNOSIS — M545 Low back pain, unspecified: Secondary | ICD-10-CM | POA: Diagnosis not present

## 2024-03-13 DIAGNOSIS — N183 Chronic kidney disease, stage 3 unspecified: Secondary | ICD-10-CM | POA: Diagnosis not present

## 2024-03-13 DIAGNOSIS — I129 Hypertensive chronic kidney disease with stage 1 through stage 4 chronic kidney disease, or unspecified chronic kidney disease: Secondary | ICD-10-CM | POA: Diagnosis not present

## 2024-03-26 DIAGNOSIS — I739 Peripheral vascular disease, unspecified: Secondary | ICD-10-CM | POA: Diagnosis not present

## 2024-03-26 DIAGNOSIS — M79672 Pain in left foot: Secondary | ICD-10-CM | POA: Diagnosis not present

## 2024-03-26 DIAGNOSIS — L110 Acquired keratosis follicularis: Secondary | ICD-10-CM | POA: Diagnosis not present

## 2024-03-26 DIAGNOSIS — M79675 Pain in left toe(s): Secondary | ICD-10-CM | POA: Diagnosis not present

## 2024-03-26 DIAGNOSIS — M79671 Pain in right foot: Secondary | ICD-10-CM | POA: Diagnosis not present

## 2024-03-26 DIAGNOSIS — M79674 Pain in right toe(s): Secondary | ICD-10-CM | POA: Diagnosis not present

## 2024-03-30 ENCOUNTER — Other Ambulatory Visit: Payer: Self-pay | Admitting: Internal Medicine

## 2024-03-30 DIAGNOSIS — I4891 Unspecified atrial fibrillation: Secondary | ICD-10-CM

## 2024-04-25 NOTE — Progress Notes (Signed)
 Remote pacemaker transmission.

## 2024-05-06 DIAGNOSIS — N1831 Chronic kidney disease, stage 3a: Secondary | ICD-10-CM | POA: Diagnosis not present

## 2024-05-06 DIAGNOSIS — T148XXA Other injury of unspecified body region, initial encounter: Secondary | ICD-10-CM | POA: Diagnosis not present

## 2024-05-06 DIAGNOSIS — Z299 Encounter for prophylactic measures, unspecified: Secondary | ICD-10-CM | POA: Diagnosis not present

## 2024-05-06 DIAGNOSIS — I10 Essential (primary) hypertension: Secondary | ICD-10-CM | POA: Diagnosis not present

## 2024-05-06 DIAGNOSIS — I4891 Unspecified atrial fibrillation: Secondary | ICD-10-CM | POA: Diagnosis not present

## 2024-06-04 DIAGNOSIS — M79675 Pain in left toe(s): Secondary | ICD-10-CM | POA: Diagnosis not present

## 2024-06-04 DIAGNOSIS — M79672 Pain in left foot: Secondary | ICD-10-CM | POA: Diagnosis not present

## 2024-06-04 DIAGNOSIS — M79674 Pain in right toe(s): Secondary | ICD-10-CM | POA: Diagnosis not present

## 2024-06-04 DIAGNOSIS — L110 Acquired keratosis follicularis: Secondary | ICD-10-CM | POA: Diagnosis not present

## 2024-06-04 DIAGNOSIS — I739 Peripheral vascular disease, unspecified: Secondary | ICD-10-CM | POA: Diagnosis not present

## 2024-06-04 DIAGNOSIS — M79671 Pain in right foot: Secondary | ICD-10-CM | POA: Diagnosis not present

## 2024-06-05 ENCOUNTER — Ambulatory Visit (INDEPENDENT_AMBULATORY_CARE_PROVIDER_SITE_OTHER): Payer: PPO

## 2024-06-05 DIAGNOSIS — I495 Sick sinus syndrome: Secondary | ICD-10-CM | POA: Diagnosis not present

## 2024-06-06 LAB — CUP PACEART REMOTE DEVICE CHECK
Battery Impedance: 2127 Ohm
Battery Remaining Longevity: 31 mo
Battery Voltage: 2.74 V
Brady Statistic AP VP Percent: 100 %
Brady Statistic AP VS Percent: 0 %
Brady Statistic AS VP Percent: 0 %
Brady Statistic AS VS Percent: 0 %
Date Time Interrogation Session: 20250820100332
Implantable Lead Connection Status: 753985
Implantable Lead Connection Status: 753985
Implantable Lead Implant Date: 20141009
Implantable Lead Implant Date: 20141009
Implantable Lead Location: 753859
Implantable Lead Location: 753860
Implantable Lead Model: 1944
Implantable Lead Model: 1948
Implantable Pulse Generator Implant Date: 20141009
Lead Channel Impedance Value: 545 Ohm
Lead Channel Impedance Value: 891 Ohm
Lead Channel Pacing Threshold Amplitude: 0.625 V
Lead Channel Pacing Threshold Amplitude: 0.75 V
Lead Channel Pacing Threshold Pulse Width: 0.4 ms
Lead Channel Pacing Threshold Pulse Width: 0.4 ms
Lead Channel Setting Pacing Amplitude: 2 V
Lead Channel Setting Pacing Amplitude: 2.5 V
Lead Channel Setting Pacing Pulse Width: 0.4 ms
Lead Channel Setting Sensing Sensitivity: 5.6 mV
Zone Setting Status: 755011
Zone Setting Status: 755011

## 2024-06-07 DIAGNOSIS — Z299 Encounter for prophylactic measures, unspecified: Secondary | ICD-10-CM | POA: Diagnosis not present

## 2024-06-07 DIAGNOSIS — I4891 Unspecified atrial fibrillation: Secondary | ICD-10-CM | POA: Diagnosis not present

## 2024-06-07 DIAGNOSIS — Z0000 Encounter for general adult medical examination without abnormal findings: Secondary | ICD-10-CM | POA: Diagnosis not present

## 2024-06-07 DIAGNOSIS — R52 Pain, unspecified: Secondary | ICD-10-CM | POA: Diagnosis not present

## 2024-06-07 DIAGNOSIS — I10 Essential (primary) hypertension: Secondary | ICD-10-CM | POA: Diagnosis not present

## 2024-06-07 DIAGNOSIS — N183 Chronic kidney disease, stage 3 unspecified: Secondary | ICD-10-CM | POA: Diagnosis not present

## 2024-06-09 ENCOUNTER — Ambulatory Visit: Payer: Self-pay | Admitting: Internal Medicine

## 2024-06-11 DIAGNOSIS — I4891 Unspecified atrial fibrillation: Secondary | ICD-10-CM | POA: Diagnosis not present

## 2024-06-11 DIAGNOSIS — H6123 Impacted cerumen, bilateral: Secondary | ICD-10-CM | POA: Diagnosis not present

## 2024-06-11 DIAGNOSIS — I10 Essential (primary) hypertension: Secondary | ICD-10-CM | POA: Diagnosis not present

## 2024-06-11 DIAGNOSIS — N1831 Chronic kidney disease, stage 3a: Secondary | ICD-10-CM | POA: Diagnosis not present

## 2024-06-11 DIAGNOSIS — Z299 Encounter for prophylactic measures, unspecified: Secondary | ICD-10-CM | POA: Diagnosis not present

## 2024-07-10 NOTE — Progress Notes (Signed)
 Remote PPM Transmission

## 2024-08-09 DIAGNOSIS — I4891 Unspecified atrial fibrillation: Secondary | ICD-10-CM | POA: Diagnosis not present

## 2024-08-09 DIAGNOSIS — F419 Anxiety disorder, unspecified: Secondary | ICD-10-CM | POA: Diagnosis not present

## 2024-08-09 DIAGNOSIS — N183 Chronic kidney disease, stage 3 unspecified: Secondary | ICD-10-CM | POA: Diagnosis not present

## 2024-08-09 DIAGNOSIS — I10 Essential (primary) hypertension: Secondary | ICD-10-CM | POA: Diagnosis not present

## 2024-08-09 DIAGNOSIS — Z299 Encounter for prophylactic measures, unspecified: Secondary | ICD-10-CM | POA: Diagnosis not present

## 2024-08-09 DIAGNOSIS — E2839 Other primary ovarian failure: Secondary | ICD-10-CM | POA: Diagnosis not present

## 2024-08-11 ENCOUNTER — Emergency Department (HOSPITAL_COMMUNITY)

## 2024-08-11 ENCOUNTER — Encounter (HOSPITAL_COMMUNITY): Payer: Self-pay

## 2024-08-11 ENCOUNTER — Other Ambulatory Visit: Payer: Self-pay

## 2024-08-11 ENCOUNTER — Emergency Department (HOSPITAL_COMMUNITY)
Admission: EM | Admit: 2024-08-11 | Discharge: 2024-08-11 | Disposition: A | Discharge status: Home / Self Care | Attending: Emergency Medicine | Admitting: Emergency Medicine

## 2024-08-11 DIAGNOSIS — N189 Chronic kidney disease, unspecified: Secondary | ICD-10-CM | POA: Insufficient documentation

## 2024-08-11 DIAGNOSIS — I129 Hypertensive chronic kidney disease with stage 1 through stage 4 chronic kidney disease, or unspecified chronic kidney disease: Secondary | ICD-10-CM | POA: Diagnosis not present

## 2024-08-11 DIAGNOSIS — R202 Paresthesia of skin: Secondary | ICD-10-CM | POA: Diagnosis not present

## 2024-08-11 DIAGNOSIS — Z7901 Long term (current) use of anticoagulants: Secondary | ICD-10-CM | POA: Insufficient documentation

## 2024-08-11 DIAGNOSIS — Z8673 Personal history of transient ischemic attack (TIA), and cerebral infarction without residual deficits: Secondary | ICD-10-CM | POA: Diagnosis not present

## 2024-08-11 DIAGNOSIS — I10 Essential (primary) hypertension: Secondary | ICD-10-CM | POA: Diagnosis not present

## 2024-08-11 DIAGNOSIS — R200 Anesthesia of skin: Secondary | ICD-10-CM | POA: Insufficient documentation

## 2024-08-11 DIAGNOSIS — Z853 Personal history of malignant neoplasm of breast: Secondary | ICD-10-CM | POA: Diagnosis not present

## 2024-08-11 DIAGNOSIS — G459 Transient cerebral ischemic attack, unspecified: Secondary | ICD-10-CM | POA: Diagnosis not present

## 2024-08-11 DIAGNOSIS — I6523 Occlusion and stenosis of bilateral carotid arteries: Secondary | ICD-10-CM | POA: Diagnosis not present

## 2024-08-11 DIAGNOSIS — Z79899 Other long term (current) drug therapy: Secondary | ICD-10-CM | POA: Insufficient documentation

## 2024-08-11 LAB — DIFFERENTIAL
Abs Immature Granulocytes: 0.01 K/uL (ref 0.00–0.07)
Basophils Absolute: 0 K/uL (ref 0.0–0.1)
Basophils Relative: 1 %
Eosinophils Absolute: 0.2 K/uL (ref 0.0–0.5)
Eosinophils Relative: 3 %
Immature Granulocytes: 0 %
Lymphocytes Relative: 19 %
Lymphs Abs: 1.1 K/uL (ref 0.7–4.0)
Monocytes Absolute: 0.6 K/uL (ref 0.1–1.0)
Monocytes Relative: 11 %
Neutro Abs: 3.9 K/uL (ref 1.7–7.7)
Neutrophils Relative %: 66 %

## 2024-08-11 LAB — URINALYSIS, ROUTINE W REFLEX MICROSCOPIC
Bacteria, UA: NONE SEEN
Bilirubin Urine: NEGATIVE
Glucose, UA: NEGATIVE mg/dL
Hgb urine dipstick: NEGATIVE
Ketones, ur: NEGATIVE mg/dL
Nitrite: NEGATIVE
Protein, ur: NEGATIVE mg/dL
Specific Gravity, Urine: >1.046 — ABNORMAL HIGH (ref 1.005–1.030)
pH: 6 (ref 5.0–8.0)

## 2024-08-11 LAB — TROPONIN T, HIGH SENSITIVITY
Troponin T High Sensitivity: 15 ng/L (ref 0–19)
Troponin T High Sensitivity: <15 ng/L (ref 0–19)

## 2024-08-11 LAB — CBC
HCT: 38.9 % (ref 36.0–46.0)
Hemoglobin: 12.3 g/dL (ref 12.0–15.0)
MCH: 31 pg (ref 26.0–34.0)
MCHC: 31.6 g/dL (ref 30.0–36.0)
MCV: 98 fL (ref 80.0–100.0)
Platelets: 166 K/uL (ref 150–400)
RBC: 3.97 MIL/uL (ref 3.87–5.11)
RDW: 13 % (ref 11.5–15.5)
WBC: 5.8 K/uL (ref 4.0–10.5)
nRBC: 0 % (ref 0.0–0.2)

## 2024-08-11 LAB — PROTIME-INR
INR: 1.2 (ref 0.8–1.2)
Prothrombin Time: 15.4 s — ABNORMAL HIGH (ref 11.4–15.2)

## 2024-08-11 LAB — COMPREHENSIVE METABOLIC PANEL WITH GFR
ALT: 9 U/L (ref 0–44)
AST: 20 U/L (ref 15–41)
Albumin: 3.9 g/dL (ref 3.5–5.0)
Alkaline Phosphatase: 69 U/L (ref 38–126)
Anion gap: 9 (ref 5–15)
BUN: 18 mg/dL (ref 8–23)
CO2: 26 mmol/L (ref 22–32)
Calcium: 9.2 mg/dL (ref 8.9–10.3)
Chloride: 103 mmol/L (ref 98–111)
Creatinine, Ser: 1.14 mg/dL — ABNORMAL HIGH (ref 0.44–1.00)
GFR, Estimated: 45 mL/min — ABNORMAL LOW (ref >60)
Glucose, Bld: 84 mg/dL (ref 70–99)
Potassium: 4.7 mmol/L (ref 3.5–5.1)
Sodium: 139 mmol/L (ref 135–145)
Total Bilirubin: 0.9 mg/dL (ref 0.0–1.2)
Total Protein: 6.4 g/dL — ABNORMAL LOW (ref 6.5–8.1)

## 2024-08-11 LAB — MAGNESIUM: Magnesium: 2.2 mg/dL (ref 1.7–2.4)

## 2024-08-11 MED ORDER — IOHEXOL 350 MG/ML SOLN
60.0000 mL | Freq: Once | INTRAVENOUS | Status: AC | PRN
  Administered 2024-08-11: 60 mL via INTRAVENOUS

## 2024-08-11 NOTE — ED Provider Notes (Signed)
 [place] EMERGENCY DEPARTMENT AT [facility] Provider Note   CSN: [id number] Arrival date & time: 08/11/24  1037     Patient presents with: Numbness   Jessica Myers is a 89 y.o. female.   HPI Patient presents for left facial numbness.  Medical history includes HTN, HLD, atrial fibrillation, breast cancer, GERD, CVA, CKD.  She is prescribed Eliquis .  She has had some recent confusion and what she describes as swimmy headed.  She was seen by PCP last week.  She was prescribed a new medication but stated that she has not taken it.  This morning, she noticed some tingling in the left side of her face.  She went to lay down in the bed and realized that she got it on the wrong side.  The numbness in her face has resolved.  She is concerned possible CVA.  She had a prior CVA approximately 10 years ago.  She denies any residual effects from this prior CVA.  Currently, she is asymptomatic.    Prior to Admission medications   Medication Sig Start Date End Date Taking? Authorizing Provider  Acetaminophen  325 MG CAPS Take 650 mg by mouth every 6 (six) hours.    [provider]  apixaban  (ELIQUIS ) 2.5 MG TABS tablet Take 1 tablet (2.5 mg total) by mouth 2 (two) times daily. 08/29/22   Landy Barnie RAMAN, NP  atorvastatin  (LIPITOR) 10 MG tablet Take 1 tablet (10 mg total) by mouth every evening. 08/29/22   Landy Barnie RAMAN, NP  cholecalciferol  (VITAMIN D ) 1000 UNITS tablet Take 1,000 Units by mouth daily.     [provider]  flecainide  (TAMBOCOR ) 150 MG tablet TAKE HALF A TABLET (75 MG TOTAL) BY MOUTH 2 (TWO) TIMES DAILY 04/02/24   Waddell Danelle ORN, MD  levothyroxine  (SYNTHROID ) 112 MCG tablet Take 1 tablet (112 mcg total) by mouth daily before breakfast. 08/29/22   Landy Barnie RAMAN, NP  lisinopril  (ZESTRIL ) 20 MG tablet Take 1 tablet (20 mg total) by mouth daily. 08/29/22   Landy Barnie RAMAN, NP  metoprolol  succinate (TOPROL -XL) 25 MG 24 hr tablet TAKE 1 TABLET BY MOUTH  TWICE A DAY 12/11/23   Waddell Danelle ORN, MD  Polyethyl Glycol-Propyl Glycol 0.4-0.3 % SOLN Place 1 drop into both eyes 3 (three) times daily as needed (for dry eyes.).    [provider]  polyethylene glycol (MIRALAX  / GLYCOLAX ) packet Take 17 g by mouth daily as needed (for constipation.).     [provider]  simethicone  (MYLICON) 125 MG chewable tablet Chew 125-250 mg by mouth every 6 (six) hours as needed for flatulence.    [provider]    Allergies: Biaxin [clarithromycin], Penicillins, Shellfish allergy, Sulfa antibiotics, and Celebrex [celecoxib]    Review of Systems  Neurological:  Positive for light-headedness and numbness.  Psychiatric/Behavioral:  Positive for confusion.   All other systems reviewed and are negative.   Updated Vital Signs BP 135/63   Pulse 74   Temp (!) 97.5 F (36.4 C) (Oral)   Resp 17   Ht 5' 4 (1.626 m)   Wt 56.7 kg   SpO2 99%   BMI 21.46 kg/m   Physical Exam Vitals and nursing note reviewed.  Constitutional:      General: She is not in acute distress.    Appearance: Normal appearance. She is well-developed. She is not ill-appearing, toxic-appearing or diaphoretic.  HENT:     Head: Normocephalic and atraumatic.     Right Ear:  External ear normal.     Left Ear: External ear normal.     Nose: Nose normal.     Mouth/Throat:     Mouth: Mucous membranes are moist.  Eyes:     Extraocular Movements: Extraocular movements intact.     Conjunctiva/sclera: Conjunctivae normal.  Cardiovascular:     Rate and Rhythm: Normal rate and regular rhythm.  Pulmonary:     Effort: Pulmonary effort is normal. No respiratory distress.  Abdominal:     General: There is no distension.     Palpations: Abdomen is soft.     Tenderness: There is no abdominal tenderness.  Musculoskeletal:        General: No swelling. Normal range of motion.     Cervical back: Normal range of motion and neck supple.     Right lower leg: No edema.      Left lower leg: No edema.  Skin:    General: Skin is warm and dry.     Capillary Refill: Capillary refill takes less than 2 seconds.     Coloration: Skin is not jaundiced or pale.  Neurological:     General: No focal deficit present.     Mental Status: She is alert and oriented to person, place, and time.     Cranial Nerves: No cranial nerve deficit.     Sensory: No sensory deficit.     Motor: No weakness.     Coordination: Coordination normal.  Psychiatric:        Mood and Affect: Mood normal.        Behavior: Behavior normal.     (all labs ordered are listed, but only abnormal results are displayed) Labs Reviewed  PROTIME-INR - Abnormal; Notable for the following components:      Result Value   Prothrombin Time 15.4 (*)    All other components within normal limits  COMPREHENSIVE METABOLIC PANEL WITH GFR - Abnormal; Notable for the following components:   Creatinine, Ser 1.14 (*)    Total Protein 6.4 (*)    GFR, Estimated 45 (*)    All other components within normal limits  URINALYSIS, ROUTINE W REFLEX MICROSCOPIC - Abnormal; Notable for the following components:   Color, Urine STRAW (*)    Specific Gravity, Urine >1.046 (*)    Leukocytes,Ua SMALL (*)    All other components within normal limits  CBC  DIFFERENTIAL  MAGNESIUM  CBG MONITORING, ED  TROPONIN T, HIGH SENSITIVITY  TROPONIN T, HIGH SENSITIVITY    EKG: None  Radiology: CT ANGIO HEAD NECK W WO CM Result Date: 08/11/2024 EXAM: CT HEAD WITHOUT CTA HEAD AND NECK WITH AND WITHOUT 08/11/2024 12:54:17 PM TECHNIQUE: CTA of the head and neck was performed with and without the administration of 60 mL of iohexol (OMNIPAQUE) 350 MG/ML injection. Noncontrast CT of the head with reconstructed 2-D images are also provided for review. Multiplanar 2D and/or 3D reformatted images are provided for review. Automated exposure control, iterative reconstruction, and/or weight based adjustment of the mA/kV was utilized to reduce the  radiation dose to as low as reasonably achievable. COMPARISON: CT head without contrast 04/12/2015. CLINICAL HISTORY: Transient ischemic attack (TIA). Patient to ER. Patient states that she is here for some left-sided facial numbness/tingling. Patient states that her last known well was last night. Patient states that when she woke up this AM she noticed the tingling in her face when she went to the bathroom. Patient states that she was also confused in the bathroom. FINDINGS: CT  HEAD: BRAIN AND VENTRICLES: Remote infarct of the right basal ganglia is stable. Periventricular and subcortical white matter hypoattenuation is moderately advanced for age. No acute intracranial hemorrhage. No mass effect or midline shift. No extra-axial fluid collection. Gray-white differentiation is maintained. No hydrocephalus. ORBITS: No acute abnormality. SINUSES: No acute abnormality. SOFT TISSUES AND SKULL: No acute abnormality. CTA NECK: AORTIC ARCH AND ARCH VESSELS: Atherosclerotic calcifications are present at the aortic arch and origin of the left subclavian artery without focal stenosis or aneurysm. No dissection or arterial injury. No significant stenosis of the brachiocephalic or subclavian arteries. CERVICAL CAROTID ARTERIES: Minimal atherosclerotic change is present in the proximal left ICA without significant stenosis relative to the more distal vessel. Minimal atherosclerotic changes are present within the internal carotid arteries bilaterally. No significant stenosis is present through the ICA terminal segments. No dissection, arterial injury, or hemodynamically significant stenosis by NASCET criteria. CERVICAL VERTEBRAL ARTERIES: The left vertebral artery is dominant. No dissection, arterial injury, or significant stenosis. VISUALIZED LUNGS AND MEDIASTINUM: Mild dependent vascular attenuation is present in both lungs, suggesting atelectasis. Edema or infection is considered less likely. SOFT TISSUES: No acute  abnormality. BONES: Chronic loss of disc height is noted at C3-C4, C4-C5, and C5-C6. No focal osseous lesions are present. CTA HEAD: ANTERIOR CIRCULATION: No significant stenosis of the internal carotid arteries. No significant stenosis of the anterior cerebral arteries. No significant stenosis of the middle cerebral arteries. No aneurysm. POSTERIOR CIRCULATION: No significant stenosis of the posterior cerebral arteries. No significant stenosis of the basilar artery. No significant stenosis of the vertebral arteries. No aneurysm. OTHER: No dural venous sinus thrombosis on this non-dedicated study. IMPRESSION: 1. No acute intracranial abnormality. 2. No large vessel occlusion, hemodynamically significant stenosis, or aneurysm in the head or neck. 3. Stable remote infarct of the right basal ganglia. 4. Moderately advanced periventricular and subcortical white matter hypoattenuation for age. This most likely reflects the sequelae of chronic microvascular ischemia. Electronically signed by: Lonni Necessary MD 08/11/2024 01:07 PM EDT RP Workstation: [id number]     Procedures   Medications Ordered in the ED  iohexol (OMNIPAQUE) 350 MG/ML injection 60 mL (60 mLs Intravenous Contrast Given 08/11/24 1242)                                    Medical Decision Making Amount and/or Complexity of Data Reviewed Labs: ordered. Radiology: ordered.  Risk Prescription drug management.   This patient presents to the ED for concern of facial numbness, this involves an extensive number of treatment options, and is a complaint that carries with it a high risk of complications and morbidity.  The differential diagnosis includes CVA, TIA, metabolic derangements, neuropathy   Co morbidities / Chronic conditions that complicate the patient evaluation  HTN, HLD, atrial fibrillation, breast cancer, GERD, CVA, CKD   Additional history obtained:  Additional history obtained from EMR External records from  outside source obtained and reviewed including patient's son   Lab Tests:  I Ordered, and personally interpreted labs.  The pertinent results include: Normal hemoglobin, no leukocytosis, baseline creatinine, normal electrolytes, normal troponin   Imaging Studies ordered:  I ordered imaging studies including CTA head and neck I independently visualized and interpreted imaging which showed no acute findings I agree with the radiologist interpretation   Cardiac Monitoring: / EKG:  The patient was maintained on a cardiac monitor.  I personally viewed and interpreted the cardiac monitored  which showed an underlying rhythm of: Paced rhythm   Problem List / ED Course / Critical interventions / Medication management  Patient presented for transient symptoms of left facial numbness.  Onset was this morning.  It has since resolved.  On arrival in the ED, patient is well-appearing.  She has no focal neurologic deficits on exam.  Patient also reports recent confusion and lightheadedness.  Workup was initiated.  Patient's lab work was reassuring.  She is unable to undergo MRI due to presence of pacemaker.  She did undergo CTA of head and neck.  Results did not show any acute findings.  On urinalysis, there is no evidence of infection.  It is concentrated, possibly suggestive of some mild dehydration.  Patient was advised to drink plenty of fluids at home.  She remained asymptomatic while in the ED.  She was able to ambulate unassisted.  She was discharged in stable condition.   Social Determinants of Health:  Has access to outpatient care      Final diagnoses:  Left facial numbness    ED Discharge Orders     None          Melvenia Motto, MD 08/11/24 [phone]

## 2024-08-11 NOTE — ED Triage Notes (Signed)
 Pt to er, pt states that she is here for some L sided facial numbness/ tingling, states that her last known well was last night, states that when she woke up this am she noticed the tingling in her face when she went to the bathroom, states that she was also confused in the bathroom.  States that she had a stroke about 10 years ago.  Pt denies the tingling in her face at this time, states that she just feels a little swimmy headed.  Son states that she also went to pmd last week for some confusion.

## 2024-08-11 NOTE — ED Notes (Signed)
 Pt/family received d/c paperwork at this time. After going over the paperwork any questions, comments, or concerns were answered to the best of this nurse's knowledge. The pt/family verbally acknowledged the teachings/instructions.

## 2024-08-11 NOTE — Discharge Instructions (Signed)
 Your test results today are reassuring.  Your urine did not show infection but was concentrated.  You may be slightly dehydrated.  Drink fluids at home to maintain good hydration.  Follow-up with your outpatient doctors.  Return to the emergency department for any new or worsening symptoms of concern.

## 2024-08-15 DIAGNOSIS — E860 Dehydration: Secondary | ICD-10-CM | POA: Diagnosis not present

## 2024-08-15 DIAGNOSIS — F419 Anxiety disorder, unspecified: Secondary | ICD-10-CM | POA: Diagnosis not present

## 2024-08-15 DIAGNOSIS — H6120 Impacted cerumen, unspecified ear: Secondary | ICD-10-CM | POA: Diagnosis not present

## 2024-08-15 DIAGNOSIS — I10 Essential (primary) hypertension: Secondary | ICD-10-CM | POA: Diagnosis not present

## 2024-08-15 DIAGNOSIS — I4891 Unspecified atrial fibrillation: Secondary | ICD-10-CM | POA: Diagnosis not present

## 2024-08-15 DIAGNOSIS — Z299 Encounter for prophylactic measures, unspecified: Secondary | ICD-10-CM | POA: Diagnosis not present

## 2024-08-22 DIAGNOSIS — Z299 Encounter for prophylactic measures, unspecified: Secondary | ICD-10-CM | POA: Diagnosis not present

## 2024-08-22 DIAGNOSIS — I10 Essential (primary) hypertension: Secondary | ICD-10-CM | POA: Diagnosis not present

## 2024-08-22 DIAGNOSIS — N183 Chronic kidney disease, stage 3 unspecified: Secondary | ICD-10-CM | POA: Diagnosis not present

## 2024-08-22 DIAGNOSIS — H6120 Impacted cerumen, unspecified ear: Secondary | ICD-10-CM | POA: Diagnosis not present

## 2024-09-04 ENCOUNTER — Ambulatory Visit: Payer: PPO

## 2024-09-04 DIAGNOSIS — I495 Sick sinus syndrome: Secondary | ICD-10-CM

## 2024-09-05 ENCOUNTER — Ambulatory Visit: Payer: Self-pay | Admitting: Internal Medicine

## 2024-09-05 LAB — CUP PACEART REMOTE DEVICE CHECK
Battery Impedance: 2145 Ohm
Battery Remaining Longevity: 31 mo
Battery Voltage: 2.73 V
Brady Statistic AP VP Percent: 99 %
Brady Statistic AP VS Percent: 0 %
Brady Statistic AS VP Percent: 1 %
Brady Statistic AS VS Percent: 0 %
Date Time Interrogation Session: 20251119114512
Implantable Lead Connection Status: 753985
Implantable Lead Connection Status: 753985
Implantable Lead Implant Date: 20141009
Implantable Lead Implant Date: 20141009
Implantable Lead Location: 753859
Implantable Lead Location: 753860
Implantable Lead Model: 1944
Implantable Lead Model: 1948
Implantable Pulse Generator Implant Date: 20141009
Lead Channel Impedance Value: 592 Ohm
Lead Channel Impedance Value: 885 Ohm
Lead Channel Pacing Threshold Amplitude: 0.625 V
Lead Channel Pacing Threshold Amplitude: 0.875 V
Lead Channel Pacing Threshold Pulse Width: 0.4 ms
Lead Channel Pacing Threshold Pulse Width: 0.4 ms
Lead Channel Setting Pacing Amplitude: 2 V
Lead Channel Setting Pacing Amplitude: 2.5 V
Lead Channel Setting Pacing Pulse Width: 0.4 ms
Lead Channel Setting Sensing Sensitivity: 5.6 mV
Zone Setting Status: 755011
Zone Setting Status: 755011

## 2024-09-06 NOTE — Progress Notes (Signed)
 Remote PPM Transmission

## 2024-09-11 DIAGNOSIS — I10 Essential (primary) hypertension: Secondary | ICD-10-CM | POA: Diagnosis not present

## 2024-09-11 DIAGNOSIS — Z299 Encounter for prophylactic measures, unspecified: Secondary | ICD-10-CM | POA: Diagnosis not present

## 2024-09-11 DIAGNOSIS — E2839 Other primary ovarian failure: Secondary | ICD-10-CM | POA: Diagnosis not present

## 2024-09-11 DIAGNOSIS — N183 Chronic kidney disease, stage 3 unspecified: Secondary | ICD-10-CM | POA: Diagnosis not present

## 2024-09-11 DIAGNOSIS — F419 Anxiety disorder, unspecified: Secondary | ICD-10-CM | POA: Diagnosis not present

## 2024-09-11 DIAGNOSIS — I4891 Unspecified atrial fibrillation: Secondary | ICD-10-CM | POA: Diagnosis not present

## 2024-09-26 ENCOUNTER — Other Ambulatory Visit: Payer: Self-pay | Admitting: Internal Medicine

## 2024-09-26 DIAGNOSIS — I4891 Unspecified atrial fibrillation: Secondary | ICD-10-CM

## 2024-10-23 ENCOUNTER — Ambulatory Visit (HOSPITAL_COMMUNITY): Admitting: Clinical

## 2024-10-23 ENCOUNTER — Encounter (HOSPITAL_COMMUNITY): Payer: Self-pay

## 2024-10-23 DIAGNOSIS — F4322 Adjustment disorder with anxiety: Secondary | ICD-10-CM | POA: Diagnosis not present

## 2024-10-23 NOTE — Progress Notes (Signed)
 IN PERSON  I connected with Jessica Myers on 10/23/2024 at  9:00 AM EST in person  and verified that I am speaking with the correct person using two identifiers.  Location: Patient: Office Provider: Office   I discussed the limitations of evaluation and management by telemedicine and the availability of in person appointments. The patient expressed understanding and agreed to proceed. ( IN PERSON)    Comprehensive Clinical Assessment (CCA) Note  10/23/2024 Jessica Myers [id number]  Chief Complaint:  Difficulty with anxiety connection to transitional change of husband and brother passing Visit Diagnosis: Adjustment Disorder with Anxious mood     CCA Screening, Triage and Referral (STR)  Patient Reported Information How did you hear about us ? No data recorded Referral name: No data recorded Referral phone number: No data recorded  Whom do you see for routine medical problems? No data recorded Practice/Facility Name: No data recorded Practice/Facility Phone Number: No data recorded Name of Contact: No data recorded Contact Number: No data recorded Contact Fax Number: No data recorded Prescriber Name: No data recorded Prescriber Address (if known): No data recorded  What Is the Reason for Your Visit/Call Today? No data recorded How Long Has This Been Causing You Problems? No data recorded What Do You Feel Would Help You the Most Today? No data recorded  Have You Recently Been in Any Inpatient Treatment (Hospital/Detox/Crisis Center/28-Day Program)? No data recorded Name/Location of Program/Hospital:No data recorded How Long Were You There? No data recorded When Were You Discharged? No data recorded  Have You Ever Received Services From [facility] Before? No data recorded Who Do You See at [facility]? No data recorded  Have You Recently Had Any Thoughts About Hurting Yourself? No data recorded Are You Planning to Commit Suicide/Harm Yourself At This time? No data  recorded  Have you Recently Had Thoughts About Hurting Someone Sherral? No data recorded Explanation: No data recorded  Have You Used Any Alcohol  or Drugs in the Past 24 Hours? No data recorded How Long Ago Did You Use Drugs or Alcohol ? No data recorded What Did You Use and How Much? No data recorded  Do You Currently Have a Therapist/Psychiatrist? No data recorded Name of Therapist/Psychiatrist: No data recorded  Have You Been Recently Discharged From Any Office Practice or Programs? No data recorded Explanation of Discharge From Practice/Program: No data recorded    CCA Screening Triage Referral Assessment Type of Contact: No data recorded Is this Initial or Reassessment? No data recorded Date Telepsych consult ordered in [facility]:  No data recorded Time Telepsych consult ordered in [facility]:  No data recorded  Patient Reported Information Reviewed? No data recorded Patient Left Without Being Seen? No data recorded Reason for Not Completing Assessment: No data recorded  Collateral Involvement: No data recorded  Does Patient Have a Court Appointed Legal Guardian? No data recorded Name and Contact of Legal Guardian: No data recorded If Minor and Not Living with Parent(s), Who has Custody? No data recorded Is CPS involved or ever been involved? No data recorded Is APS involved or ever been involved? No data recorded  Patient Determined To Be At Risk for Harm To Self or Others Based on Review of Patient Reported Information or Presenting Complaint? No data recorded Method: No data recorded Availability of Means: No data recorded Intent: No data recorded Notification Required: No data recorded Additional Information for Danger to Others Potential: No data recorded Additional Comments for Danger to Others Potential: No data recorded Are There Guns  or Other Weapons in Your Home? No data recorded Types of Guns/Weapons: No data recorded Are These Weapons Safely Secured?                             No data recorded Who Could Verify You Are Able To Have These Secured: No data recorded Do You Have any Outstanding Charges, Pending Court Dates, Parole/Probation? No data recorded Contacted To Inform of Risk of Harm To Self or Others: No data recorded  Location of Assessment: No data recorded  Does Patient Present under Involuntary Commitment? No data recorded IVC Papers Initial File Date: No data recorded  [place] of Residence: No data recorded  Patient Currently Receiving the Following Services: No data recorded  Determination of Need: No data recorded  Options For Referral: No data recorded    CCA Biopsychosocial Intake/Chief Complaint:  The patient notes being a self referral for MH treatment services having difficulty with adjustment after the passing of her brother and her husband and having alot of anxiety  Current Symptoms/Problems: Difficulty with anxiety post having loss of loved ones and transitional adjustment post her husband of 71 years having a at home accident and passing from comoplications from the at home accident and then her brother passing 2 weeks after this.   Patient Reported Schizophrenia/Schizoaffective Diagnosis in Past: No data recorded  Strengths: Doing well with taking care of her home and cleaning ( The patient currently has added a in home health aid to be there with her in the evenings)  Preferences: Cleaning her home, watching tv, and going to her mail box ( no current involvement with commuiity services or church involvement)  Abilities: None noted   Type of Services Patient Feels are Needed: Individual Therapy/ Med Management (The patient notes she is not interested in Med Therapy)   Initial Clinical Notes/Concerns: No prior involvement with counseling services. The patient notes declining wanting med therapy for her anxiety although it was offered through her PCP. No prior hospilzation for MH no current S/I or H/I   Mental Health  Symptoms Depression:  None   Duration of Depressive symptoms: NA  Mania:  None   Anxiety:   Tension; Worrying; Irritability; Difficulty concentrating; Sleep; Restlessness   Psychosis:  None   Duration of Psychotic symptoms: NA  Trauma:  None   Obsessions:  None   Compulsions:  None   Inattention:  None   Hyperactivity/Impulsivity:  None   Oppositional/Defiant Behaviors:  None   Emotional Irregularity:  None   Other Mood/Personality Symptoms:  NA    Mental Status Exam Appearance and self-care  Stature:  Average   Weight:  Average weight   Clothing:  Casual   Grooming:  Normal   Cosmetic use:  Age appropriate   Posture/gait:  Normal   Motor activity:  Not Remarkable   Sensorium  Attention:  Normal   Concentration:  Anxiety interferes   Orientation:  X5   Recall/memory:  Normal   Affect and Mood  Affect:  Appropriate   Mood:  Anxious   Relating  Eye contact:  None   Facial expression:  Anxious   Attitude toward examiner:  Cooperative   Thought and Language  Speech flow: Normal   Thought content:  Appropriate to Mood and Circumstances   Preoccupation:  None   Hallucinations:  None   Organization:  Logical  [employer] of Knowledge:  Good   Intelligence:  Average  Abstraction:  Normal   Judgement:  Good   Reality Testing:  Realistic   Insight:  Good   Decision Making:  Normal   Social Functioning  Social Maturity:  Isolates   Social Judgement:  Normal   Stress  Stressors:  Grief/losses; Illness; Transitions   Coping Ability:  Normal   Skill Deficits:  None   Supports:  Family     Religion: Religion/Spirituality Are You A Religious Person?: Yes What is Your Religious Affiliation?: Methodist How Might This Affect Treatment?: NA  Leisure/Recreation: Leisure / Recreation Do You Have Hobbies?: No  Exercise/Diet: Exercise/Diet Do You Exercise?: No Have You Gained or Lost A Significant Amount of  Weight in the Past Six Months?: No Do You Follow a Special Diet?: No Do You Have Any Trouble Sleeping?: Yes Explanation of Sleeping Difficulties: The patient notes having difficuty with her sleep cycle   CCA Employment/Education Employment/Work Situation: 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?: No  Education: Education Is Patient Currently Attending School?: No Last Grade Completed: 12 Name of High School: [employer] Did Garment/textile Technologist From [employer]?: Yes Did Theme Park Manager?: No Did Designer, Television/film Set?: No Did You Have Any Special Interests In School?: NA Did You Have An Individualized Education Program (IIEP): No Did You Have Any Difficulty At School?: No Patient's Education Has Been Impacted by Current Illness: No   CCA Family/Childhood History Family and Relationship History: Family history Marital status: Widowed Widowed, when?: 2024 Are you sexually active?: No What is your sexual orientation?: Heterosexual Has your sexual activity been affected by drugs, alcohol , medication, or emotional stress?: NA Does patient have children?: Yes How many children?: 2 How is patient's relationship with their children?: The patient notes having 2 children and 6 grandchildren  Childhood History:  Childhood History By whom was/is the patient raised?: Both parents Description of patient's relationship with caregiver when they were a child: Good Patient's description of current relationship with people who raised him/her: Both parents are deceased How were you disciplined when you got in trouble as a child/adolescent?: Spanking/Grounding Does patient have siblings?: Yes Number of Siblings: 2 Description of patient's current relationship with siblings: The patient notes her sister  passed away when the pateint was just 89yrs old. The patients brother has also passed away. Did patient suffer any verbal/emotional/physical/sexual abuse as a child?: No Did patient suffer from severe childhood neglect?: No Has patient ever been sexually abused/assaulted/raped as an adolescent or adult?: No Was the patient ever a victim of a crime or a disaster?: No Witnessed domestic violence?: No Has patient been affected by domestic violence as an adult?: No  Child/Adolescent Assessment:     CCA Substance Use Alcohol /Drug Use: Alcohol  / Drug Use Pain Medications: See MAR Prescriptions: See MAR Over the Counter: Tylonol History of alcohol  / drug use?: No history of alcohol  / drug abuse Longest period of sobriety (when/how long): NA                         ASAM's:  Six Dimensions of Multidimensional Assessment  Dimension 1:  Acute Intoxication and/or Withdrawal Potential:      Dimension 2:  Biomedical Conditions and Complications:      Dimension 3:  Emotional, Behavioral, or Cognitive Conditions and Complications:  Dimension 4:  Readiness to Change:     Dimension 5:  Relapse, Continued use, or Continued Problem Potential:     Dimension 6:  Recovery/Living Environment:     ASAM Severity Score:    ASAM Recommended Level of Treatment:     Substance use Disorder (SUD)    Recommendations for Services/Supports/Treatments: Recommendations for Services/Supports/Treatments Recommendations For Services/Supports/Treatments: Individual Therapy  DSM5 Diagnoses: Patient Active Problem List   Diagnosis Date Noted   Hyperlipidemia LDL goal <100 08/18/2022   Chronic constipation 08/18/2022   Aortic atherosclerosis 08/18/2022   Stage 3a chronic kidney disease (CKD) (HCC) 08/18/2022   Neurocognitive deficits 08/18/2022   Unspecified protein-calorie malnutrition 08/18/2022   Thrombocytopenia 08/10/2022   GERD (gastroesophageal reflux disease) 08/10/2022   Vitamin D   deficiency 08/10/2022   Right leg swelling 08/10/2022   History of CVA (cerebrovascular accident) 08/10/2022   Closed displaced fracture of right femoral neck (HCC) 08/09/2022   Breast cancer, right (HCC) 07/13/2018   Malignant neoplasm of upper-outer quadrant of right breast in female, estrogen receptor positive (HCC) 06/15/2018   Long term current use of anticoagulant therapy 07/05/2016   Musculoskeletal arm pain 01/03/2014   Pacemaker 10/24/2013   Atrial fibrillation (HCC) 10/24/2013   Syncope 07/25/2013   Essential hypertension 07/16/2013   Mixed hyperlipidemia 07/16/2013   Acquired hypothyroidism 07/16/2013   Encounter for long-term (current) use of medications 07/16/2013    Patient Centered Plan: Patient is on the following Treatment Plan(s):  Adjustment Disorder with Anxious Mood   Referrals to Alternative Service(s): Referred to Alternative Service(s):   Place:   Date:   Time:    Referred to Alternative Service(s):   Place:   Date:   Time:    Referred to Alternative Service(s):   Place:   Date:   Time:    Referred to Alternative Service(s):   Place:   Date:   Time:      Collaboration of Care: No Additional Collaboration for this session  Patient/Guardian was advised Release of Information must be obtained prior to any record release in order to collaborate their care with an outside provider. Patient/Guardian was advised if they have not already done so to contact the registration department to sign all necessary forms in order for us  to release information regarding their care.   Consent: Patient/Guardian gives verbal consent for treatment and assignment of benefits for services provided during this visit. Patient/Guardian expressed understanding and agreed to proceed.   I discussed the assessment and treatment plan with the patient. The patient was provided an opportunity to ask questions and all were answered. The patient agreed with the plan and demonstrated an  understanding of the instructions.   The patient was advised to call back or seek an in-person evaluation if the symptoms worsen or if the condition fails to improve as anticipated.  I provided 45 minutes of face-to-face time during this encounter.   Jerel ONEIDA Pepper, LCSW  10/23/2024

## 2024-10-24 ENCOUNTER — Inpatient Hospital Stay: Payer: PPO | Attending: Hematology and Oncology | Admitting: Hematology and Oncology

## 2024-10-24 VITALS — BP 148/73 | HR 87 | Temp 97.8°F | Resp 18 | Ht 64.0 in

## 2024-10-24 DIAGNOSIS — Z170 Estrogen receptor positive status [ER+]: Secondary | ICD-10-CM | POA: Insufficient documentation

## 2024-10-24 DIAGNOSIS — Z1732 Human epidermal growth factor receptor 2 negative status: Secondary | ICD-10-CM | POA: Insufficient documentation

## 2024-10-24 DIAGNOSIS — Z79899 Other long term (current) drug therapy: Secondary | ICD-10-CM | POA: Insufficient documentation

## 2024-10-24 DIAGNOSIS — Z7901 Long term (current) use of anticoagulants: Secondary | ICD-10-CM | POA: Diagnosis not present

## 2024-10-24 DIAGNOSIS — Z1721 Progesterone receptor positive status: Secondary | ICD-10-CM | POA: Diagnosis not present

## 2024-10-24 DIAGNOSIS — C50411 Malignant neoplasm of upper-outer quadrant of right female breast: Secondary | ICD-10-CM | POA: Insufficient documentation

## 2024-10-24 DIAGNOSIS — Z79811 Long term (current) use of aromatase inhibitors: Secondary | ICD-10-CM | POA: Insufficient documentation

## 2024-10-24 DIAGNOSIS — Z803 Family history of malignant neoplasm of breast: Secondary | ICD-10-CM | POA: Diagnosis not present

## 2024-10-24 NOTE — Progress Notes (Signed)
 "  Patient Care Team: Maree Isles, MD as PCP - General (Internal Medicine) Waddell Danelle ORN, MD as PCP - Electrophysiology (Cardiology) Waddell Danelle ORN, MD as Consulting Physician (Cardiology) Ebbie Cough, MD as Consulting Physician (General Surgery) Odean Potts, MD as Consulting Physician (Hematology and Oncology) Dewey Rush, MD as Consulting Physician (Radiation Oncology) Crawford Morna Pickle, NP as Nurse Practitioner (Hematology and Oncology)  DIAGNOSIS:  Encounter Diagnosis  Name Primary?   Malignant neoplasm of upper-outer quadrant of right breast in female, estrogen receptor positive (HCC) Yes    SUMMARY OF ONCOLOGIC HISTORY: Oncology History  Malignant neoplasm of upper-outer quadrant of right breast in female, estrogen receptor positive (HCC)  06/11/2018 Initial Diagnosis   Screening detected right breast asymmetry posteriorly measuring 0.3 cm at 12 o'clock position.  Biopsy revealed grade 1-2 invasive ductal carcinoma ER 100%, PR 80%, Ki-67 2%, HER-2 negative, T1 a N0 stage Ia   07/13/2018 Surgery   Right lumpectomy: IDC grade 2, 0.7 cm, cancer does not involve margins, negative for lymphovascular or perineural invasion, lymph nodes not sampled, ER 100%, PR 80%, HER-2 negative, Ki-67 2%, T1B NX stage Ia   07/25/2018 Cancer Staging   Staging form: Breast, AJCC 8th Edition - Pathologic: Stage IA (pT1b, pN0, cM0, G2, ER+, PR+, HER2-) - Signed by Crawford Morna Pickle, NP on 07/25/2018   07/2018 -  Anti-estrogen oral therapy   Letrozole  daily     CHIEF COMPLIANT: Surveillance of breast cancer  HISTORY OF PRESENT ILLNESS:   History of Present Illness Jessica Myers is a 89 year old female with estrogen receptor-positive invasive ductal carcinoma of the right breast, status post lumpectomy and adjuvant letrozole , in remission, who presents for routine oncology follow-up and breast cancer surveillance.  She is seven to eight years from initial right breast  cancer diagnosis, treated with curative lumpectomy and a four-year course of adjuvant letrozole .  Her most recent mammogram within the past year showed a small lesion with benign follow-up. She is asymptomatic with no new breast symptoms, pain, or changes.  She continues annual mammographic surveillance given a strong family history of breast cancer, as her mother required bilateral mastectomy. She is mobile and attends visits independently with support from her daughter and overnight caregivers.       ALLERGIES:  is allergic to biaxin [clarithromycin], penicillins, shellfish allergy, sulfa antibiotics, and celebrex [celecoxib].  MEDICATIONS:  Current Outpatient Medications  Medication Sig Dispense Refill   Acetaminophen  325 MG CAPS Take 650 mg by mouth every 6 (six) hours.     apixaban  (ELIQUIS ) 2.5 MG TABS tablet Take 1 tablet (2.5 mg total) by mouth 2 (two) times daily. 60 tablet 0   atorvastatin  (LIPITOR) 10 MG tablet Take 1 tablet (10 mg total) by mouth every evening. 30 tablet 0   cholecalciferol  (VITAMIN D ) 1000 UNITS tablet Take 1,000 Units by mouth daily.      flecainide  (TAMBOCOR ) 150 MG tablet TAKE HALF A TABLET (75 MG TOTAL) BY MOUTH 2 (TWO) TIMES DAILY 90 tablet 0   levothyroxine  (SYNTHROID ) 112 MCG tablet Take 1 tablet (112 mcg total) by mouth daily before breakfast. 30 tablet 0   lisinopril  (ZESTRIL ) 20 MG tablet Take 1 tablet (20 mg total) by mouth daily. 30 tablet 0   meclizine  (ANTIVERT ) 25 MG tablet Take 25 mg by mouth 3 (three) times daily.     metoprolol  succinate (TOPROL -XL) 25 MG 24 hr tablet TAKE 1 TABLET BY MOUTH TWICE A DAY 180 tablet 3   Polyethyl Glycol-Propyl Glycol 0.4-0.3 %  SOLN Place 1 drop into both eyes 3 (three) times daily as needed (for dry eyes.).     polyethylene glycol (MIRALAX  / GLYCOLAX ) packet Take 17 g by mouth daily as needed (for constipation.).      simethicone  (MYLICON) 125 MG chewable tablet Chew 125-250 mg by mouth every 6 (six) hours as  needed for flatulence.     No current facility-administered medications for this visit.    PHYSICAL EXAMINATION: ECOG PERFORMANCE STATUS: 1 - Symptomatic but completely ambulatory  Vitals:   10/24/24 1015  BP: (!) 148/73  Pulse: 87  Resp: 18  Temp: 97.8 F (36.6 C)  SpO2: 100%   There were no vitals filed for this visit.  Physical Exam Breast exam: Benign  (exam performed in the presence of a chaperone)  LABORATORY DATA:  I have reviewed the data as listed    Latest Ref Rng & Units 08/11/2024   11:32 AM 08/29/2022    8:10 AM 08/22/2022    8:00 AM  CMP  Glucose 70 - 99 mg/dL 84  87  87   BUN 8 - 23 mg/dL 18  29  38   Creatinine 0.44 - 1.00 mg/dL 8.85  8.61  8.73   Sodium 135 - 145 mmol/L 139  140  137   Potassium 3.5 - 5.1 mmol/L 4.7  4.4  4.3   Chloride 98 - 111 mmol/L 103  107  104   CO2 22 - 32 mmol/L 26  26  26    Calcium  8.9 - 10.3 mg/dL 9.2  9.4  9.4   Total Protein 6.5 - 8.1 g/dL 6.4     Total Bilirubin 0.0 - 1.2 mg/dL 0.9     Alkaline Phos 38 - 126 U/L 69     AST 15 - 41 U/L 20     ALT 0 - 44 U/L 9       Lab Results  Component Value Date   WBC 5.8 08/11/2024   HGB 12.3 08/11/2024   HCT 38.9 08/11/2024   MCV 98.0 08/11/2024   PLT 166 08/11/2024   NEUTROABS 3.9 08/11/2024    ASSESSMENT & PLAN:  Malignant neoplasm of upper-outer quadrant of right breast in female, estrogen receptor positive (HCC) 07/13/2018: Right lumpectomy: IDC grade 2, 0.7 cm, cancer does not involve margins, negative for lymphovascular or perineural invasion, lymph nodes not sampled, ER 100%, PR 80%, HER-2 negative, Ki-67 2%, T1B NX stage Ia   Prior treatment: Adjuvant antiestrogen therapy with letrozole  2.5 mg daily started Oct 2019 completed July 2023   Breast cancer Surveillance: 1. Mammogram: Patient is continuing her mammograms annually.  I discussed with her about stopping the mammograms after age 49. 2. Dexa Scan 09/11/18: T score -1.8   3.  Breast exam 10/24/2024: Benign     Right hip fracture 08/09/2022 status post surgery   Return to clinic on an as-needed basis        No orders of the defined types were placed in this encounter.  The patient has a good understanding of the overall plan. she agrees with it. she will call with any problems that may develop before the next visit here.  I personally spent a total of 30 minutes in the care of the patient today including preparing to see the patient, getting/reviewing separately obtained history, performing a medically appropriate exam/evaluation, counseling and educating, placing orders, referring and communicating with other health care professionals, documenting clinical information in the EHR, independently interpreting results, communicating results, and coordinating  care.   Viinay K Lannie Yusuf, MD 10/24/2024    "

## 2024-10-24 NOTE — Assessment & Plan Note (Signed)
 07/13/2018: Right lumpectomy: IDC grade 2, 0.7 cm, cancer does not involve margins, negative for lymphovascular or perineural invasion, lymph nodes not sampled, ER 100%, PR 80%, HER-2 negative, Ki-67 2%, T1B NX stage Ia   Prior treatment: Adjuvant antiestrogen therapy with letrozole  2.5 mg daily started Oct 2019 completed July 2023   Breast cancer Surveillance: 1. Mammogram: 07/06/2020: [facility] benign breast density category B 2. Dexa Scan 09/11/18: T score -1.8   3.  Breast exam 10/24/2024: Benign   Right hip fracture 08/09/2022 status post surgery   Return to clinic on an as-needed basis

## 2024-11-15 ENCOUNTER — Ambulatory Visit: Payer: Self-pay | Admitting: Student

## 2024-11-21 ENCOUNTER — Ambulatory Visit (HOSPITAL_COMMUNITY): Admitting: Clinical

## 2024-12-11 ENCOUNTER — Ambulatory Visit (HOSPITAL_COMMUNITY): Admitting: Clinical

## 2025-01-24 ENCOUNTER — Ambulatory Visit: Admitting: Student in an Organized Health Care Education/Training Program
